# Patient Record
Sex: Female | Born: 1965 | Race: Black or African American | Hispanic: No | Marital: Single | Smoking: Current every day smoker
Health system: Southern US, Community
[De-identification: ages and names within clinical notes are randomized; demographics above are authoritative.]

## PROBLEM LIST (undated history)

## (undated) DIAGNOSIS — N838 Other noninflammatory disorders of ovary, fallopian tube and broad ligament: Secondary | ICD-10-CM

## (undated) DIAGNOSIS — N939 Abnormal uterine and vaginal bleeding, unspecified: Secondary | ICD-10-CM

## (undated) DIAGNOSIS — D631 Anemia in chronic kidney disease: Secondary | ICD-10-CM

## (undated) DIAGNOSIS — L0390 Cellulitis, unspecified: Secondary | ICD-10-CM

## (undated) DIAGNOSIS — N183 Chronic kidney disease, stage 3 unspecified: Secondary | ICD-10-CM

## (undated) DIAGNOSIS — F17200 Nicotine dependence, unspecified, uncomplicated: Secondary | ICD-10-CM

## (undated) DIAGNOSIS — D472 Monoclonal gammopathy: Secondary | ICD-10-CM

## (undated) DIAGNOSIS — Z91199 Patient's noncompliance with other medical treatment and regimen due to unspecified reason: Secondary | ICD-10-CM

## (undated) DIAGNOSIS — F419 Anxiety disorder, unspecified: Secondary | ICD-10-CM

## (undated) DIAGNOSIS — I10 Essential (primary) hypertension: Secondary | ICD-10-CM

## (undated) DIAGNOSIS — G629 Polyneuropathy, unspecified: Secondary | ICD-10-CM

## (undated) DIAGNOSIS — N2581 Secondary hyperparathyroidism of renal origin: Secondary | ICD-10-CM

## (undated) DIAGNOSIS — J42 Unspecified chronic bronchitis: Secondary | ICD-10-CM

## (undated) DIAGNOSIS — M4800 Spinal stenosis, site unspecified: Secondary | ICD-10-CM

## (undated) DIAGNOSIS — E1101 Type 2 diabetes mellitus with hyperosmolarity with coma: Secondary | ICD-10-CM

## (undated) DIAGNOSIS — E785 Hyperlipidemia, unspecified: Secondary | ICD-10-CM

## (undated) DIAGNOSIS — M109 Gout, unspecified: Secondary | ICD-10-CM

## (undated) HISTORY — DX: Monoclonal gammopathy: D47.2

## (undated) HISTORY — DX: Secondary hyperparathyroidism of renal origin: N25.81

## (undated) HISTORY — DX: Nicotine dependence, unspecified, uncomplicated: F17.200

## (undated) HISTORY — DX: Morbid (severe) obesity due to excess calories: E66.01

## (undated) HISTORY — DX: Patient's noncompliance with other medical treatment and regimen due to unspecified reason: Z91.199

## (undated) HISTORY — PX: TONSILLECTOMY: SUR1361

## (undated) HISTORY — DX: Anxiety disorder, unspecified: F41.9

## (undated) HISTORY — DX: Gout, unspecified: M10.9

## (undated) HISTORY — DX: Other noninflammatory disorders of ovary, fallopian tube and broad ligament: N83.8

## (undated) HISTORY — DX: Spinal stenosis, site unspecified: M48.00

## (undated) HISTORY — DX: Type 2 diabetes mellitus with hyperosmolarity with coma: E11.01

## (undated) HISTORY — DX: Anemia in chronic kidney disease: D63.1

## (undated) HISTORY — DX: Abnormal uterine and vaginal bleeding, unspecified: N93.9

---

## 1997-11-03 ENCOUNTER — Other Ambulatory Visit: Admission: RE | Admit: 1997-11-03 | Discharge: 1997-11-03 | Payer: Self-pay | Admitting: Internal Medicine

## 2000-06-01 ENCOUNTER — Emergency Department (HOSPITAL_COMMUNITY): Admission: EM | Admit: 2000-06-01 | Discharge: 2000-06-01 | Payer: Self-pay | Admitting: Emergency Medicine

## 2000-06-01 ENCOUNTER — Encounter: Payer: Self-pay | Admitting: Emergency Medicine

## 2003-06-03 ENCOUNTER — Ambulatory Visit (HOSPITAL_COMMUNITY): Admission: RE | Admit: 2003-06-03 | Discharge: 2003-06-03 | Payer: Self-pay | Admitting: Family Medicine

## 2003-12-20 ENCOUNTER — Ambulatory Visit: Payer: Self-pay | Admitting: Nurse Practitioner

## 2004-01-14 ENCOUNTER — Ambulatory Visit: Payer: Self-pay | Admitting: Nurse Practitioner

## 2004-04-17 ENCOUNTER — Ambulatory Visit: Payer: Self-pay | Admitting: Nurse Practitioner

## 2004-06-16 ENCOUNTER — Ambulatory Visit: Payer: Self-pay | Admitting: *Deleted

## 2004-06-16 ENCOUNTER — Ambulatory Visit: Payer: Self-pay | Admitting: Nurse Practitioner

## 2004-12-01 ENCOUNTER — Encounter: Payer: Self-pay | Admitting: *Deleted

## 2004-12-01 IMAGING — CR DG CHEST 2V
3 series · 3 of 3 positions shown · non-contrast
Comparison: None.

CLINICAL DATA: Short of breath and cough with low O2 SATS.  
 CHEST - 2 VIEW:

[view not recorded (1 of 3)]
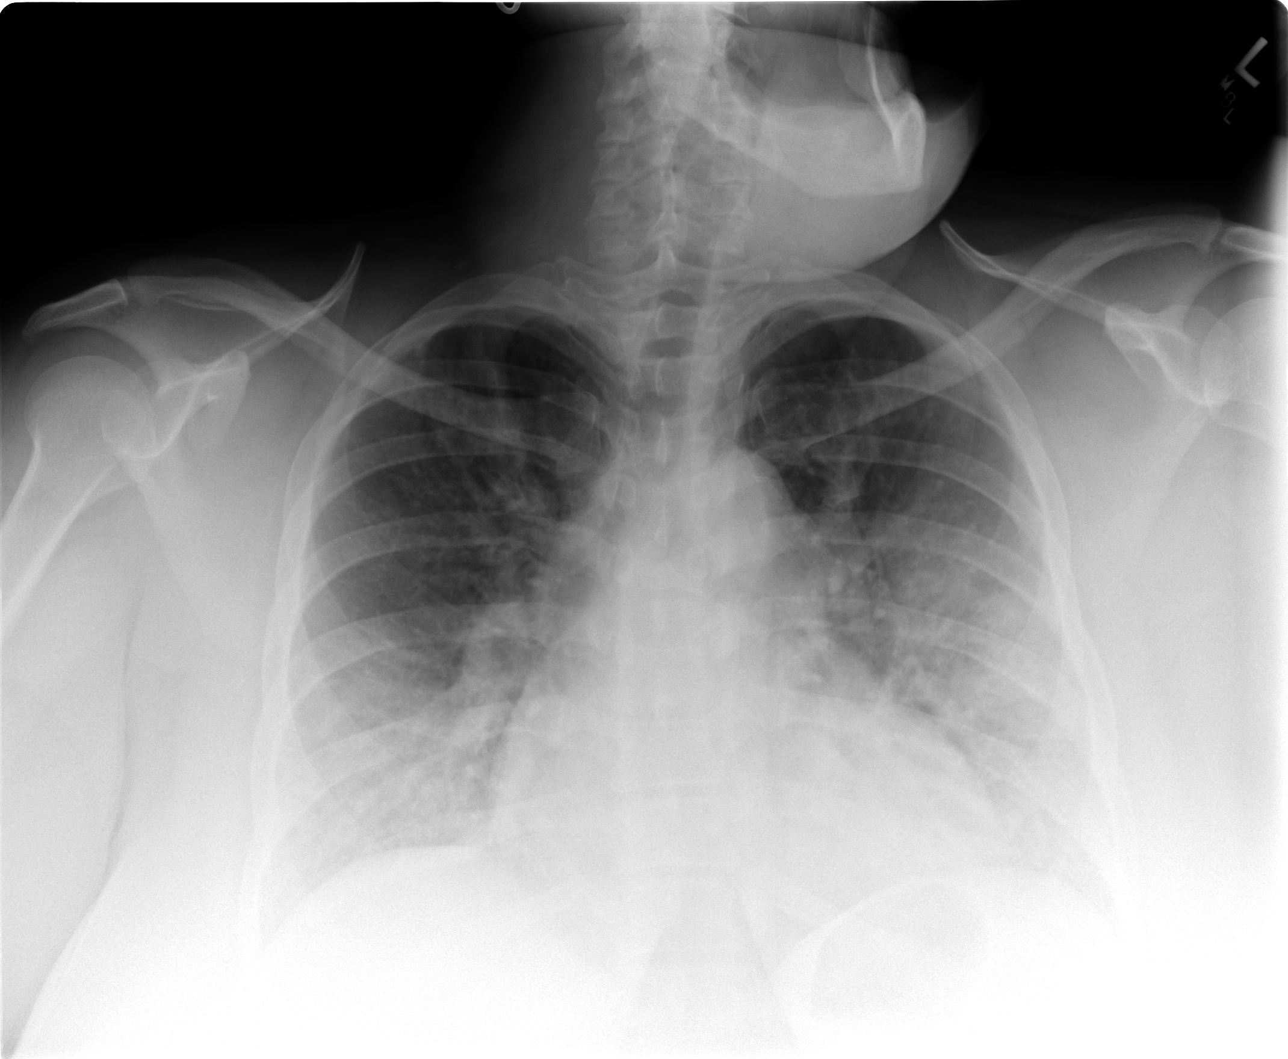

[view not recorded (2 of 3)]
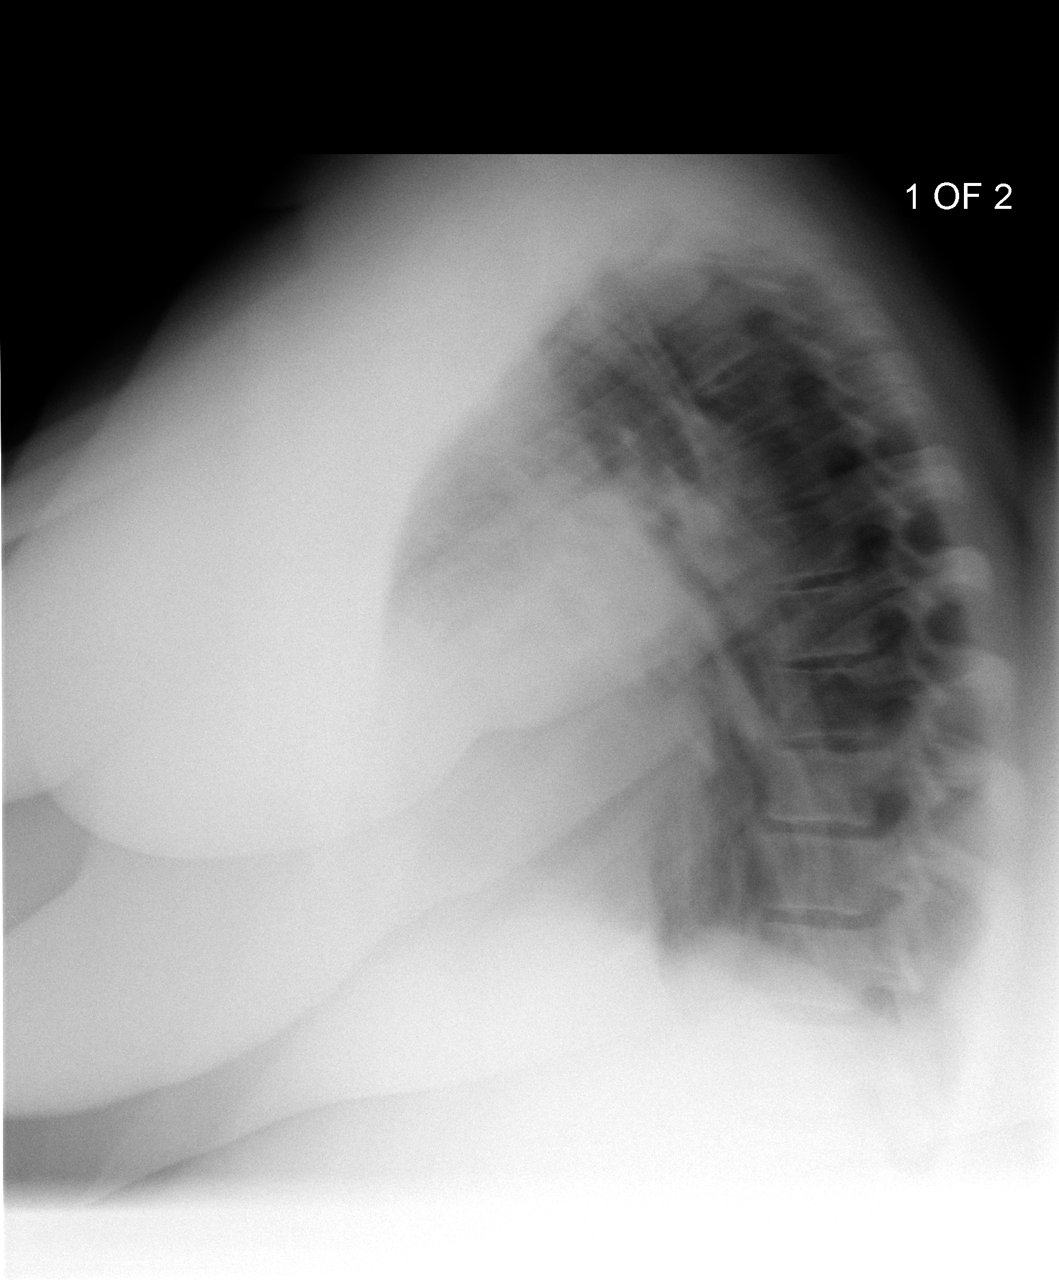

[view not recorded (3 of 3)]
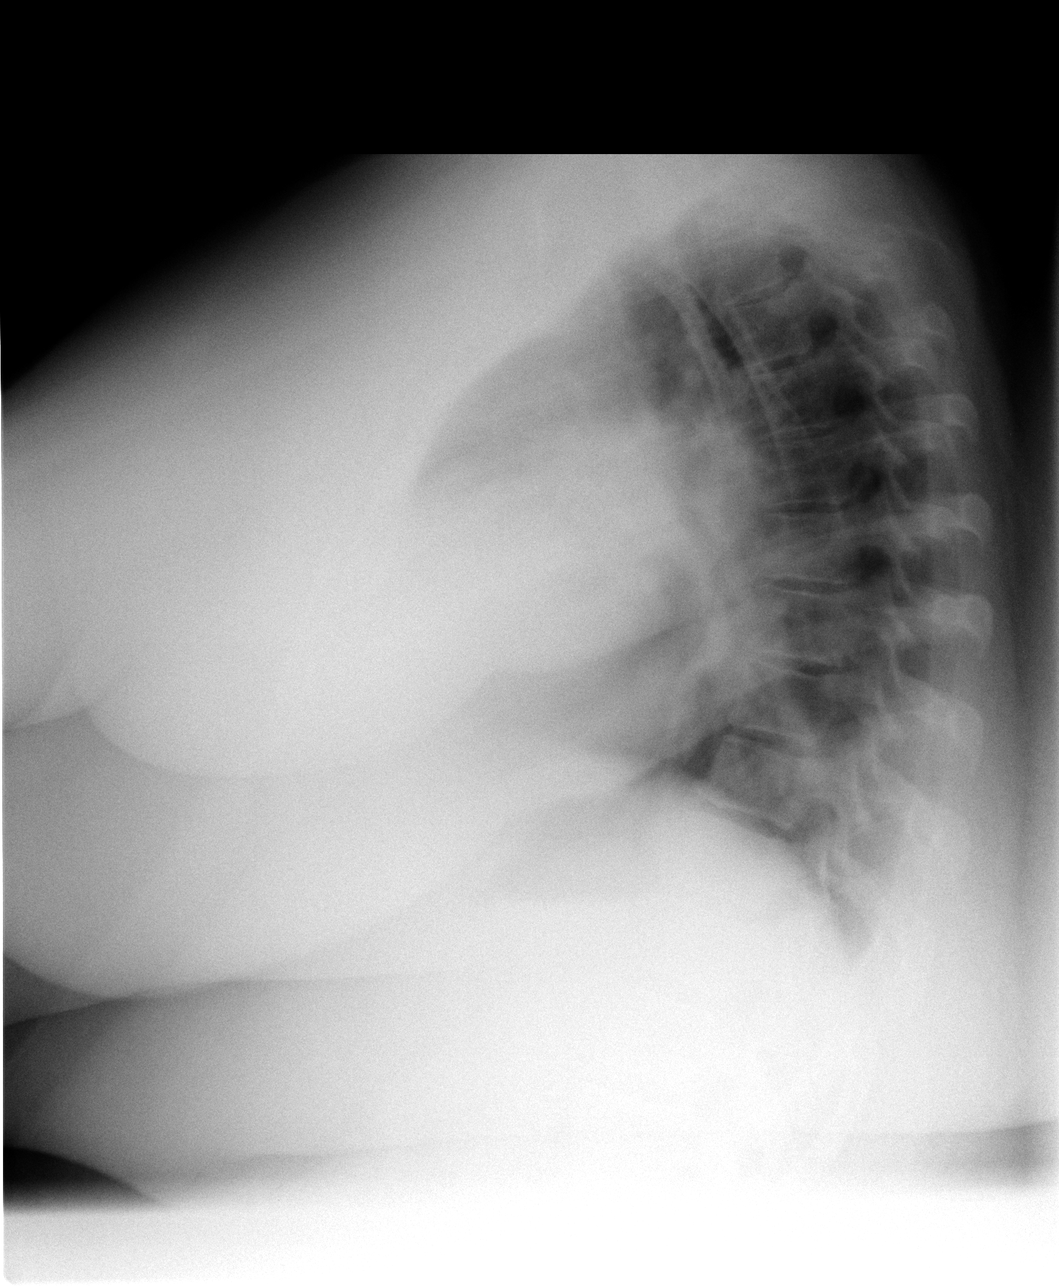

[3 of 3 positions shown; findings below may reference images not displayed]

FINDINGS: The PA film is underpenetrated and there is underpenetration and breathing motion artifact on the lateral view.  The patient?s body habitus is very large and this is a limited study.  The heart is enlarged.  There is no definite congestive heart failure.  There is a possible infiltrate in the left mid and lower lung on the PA film.  This could be an artifact but it is suspicious for pneumonia.
IMPRESSION: 1.  Technically suboptimal due to the patient?s size.
 2.  Cardiomegaly without definite failure.
 3.  Possible acute air space disease on the left.

## 2004-12-02 ENCOUNTER — Encounter (INDEPENDENT_AMBULATORY_CARE_PROVIDER_SITE_OTHER): Payer: Self-pay | Admitting: *Deleted

## 2004-12-02 ENCOUNTER — Inpatient Hospital Stay (HOSPITAL_COMMUNITY): Admission: EM | Admit: 2004-12-02 | Discharge: 2004-12-06 | Payer: Self-pay | Admitting: Emergency Medicine

## 2004-12-04 ENCOUNTER — Encounter (INDEPENDENT_AMBULATORY_CARE_PROVIDER_SITE_OTHER): Payer: Self-pay | Admitting: Cardiology

## 2004-12-29 ENCOUNTER — Ambulatory Visit: Payer: Self-pay | Admitting: Nurse Practitioner

## 2005-01-12 ENCOUNTER — Ambulatory Visit: Payer: Self-pay | Admitting: Nurse Practitioner

## 2005-06-29 ENCOUNTER — Ambulatory Visit: Payer: Self-pay | Admitting: Nurse Practitioner

## 2005-12-31 ENCOUNTER — Ambulatory Visit: Payer: Self-pay | Admitting: Nurse Practitioner

## 2006-01-10 ENCOUNTER — Ambulatory Visit: Payer: Self-pay | Admitting: Nurse Practitioner

## 2006-05-16 ENCOUNTER — Inpatient Hospital Stay (HOSPITAL_COMMUNITY): Admission: EM | Admit: 2006-05-16 | Discharge: 2006-05-21 | Payer: Self-pay | Admitting: Family Medicine

## 2006-05-16 ENCOUNTER — Ambulatory Visit: Payer: Self-pay | Admitting: Cardiology

## 2006-05-16 IMAGING — CR DG CHEST 1V PORT
1 series · 1 of 1 positions shown · non-contrast
Comparison: [DATE].

CLINICAL DATA: Fever, cough and respiratory distress; possible pneumonia, congestive heart failure.  Hypertension, diabetes, bronchitis.
 PORTABLE CHEST ? 1 VIEW ? [H3] HOURS:

[view not recorded]
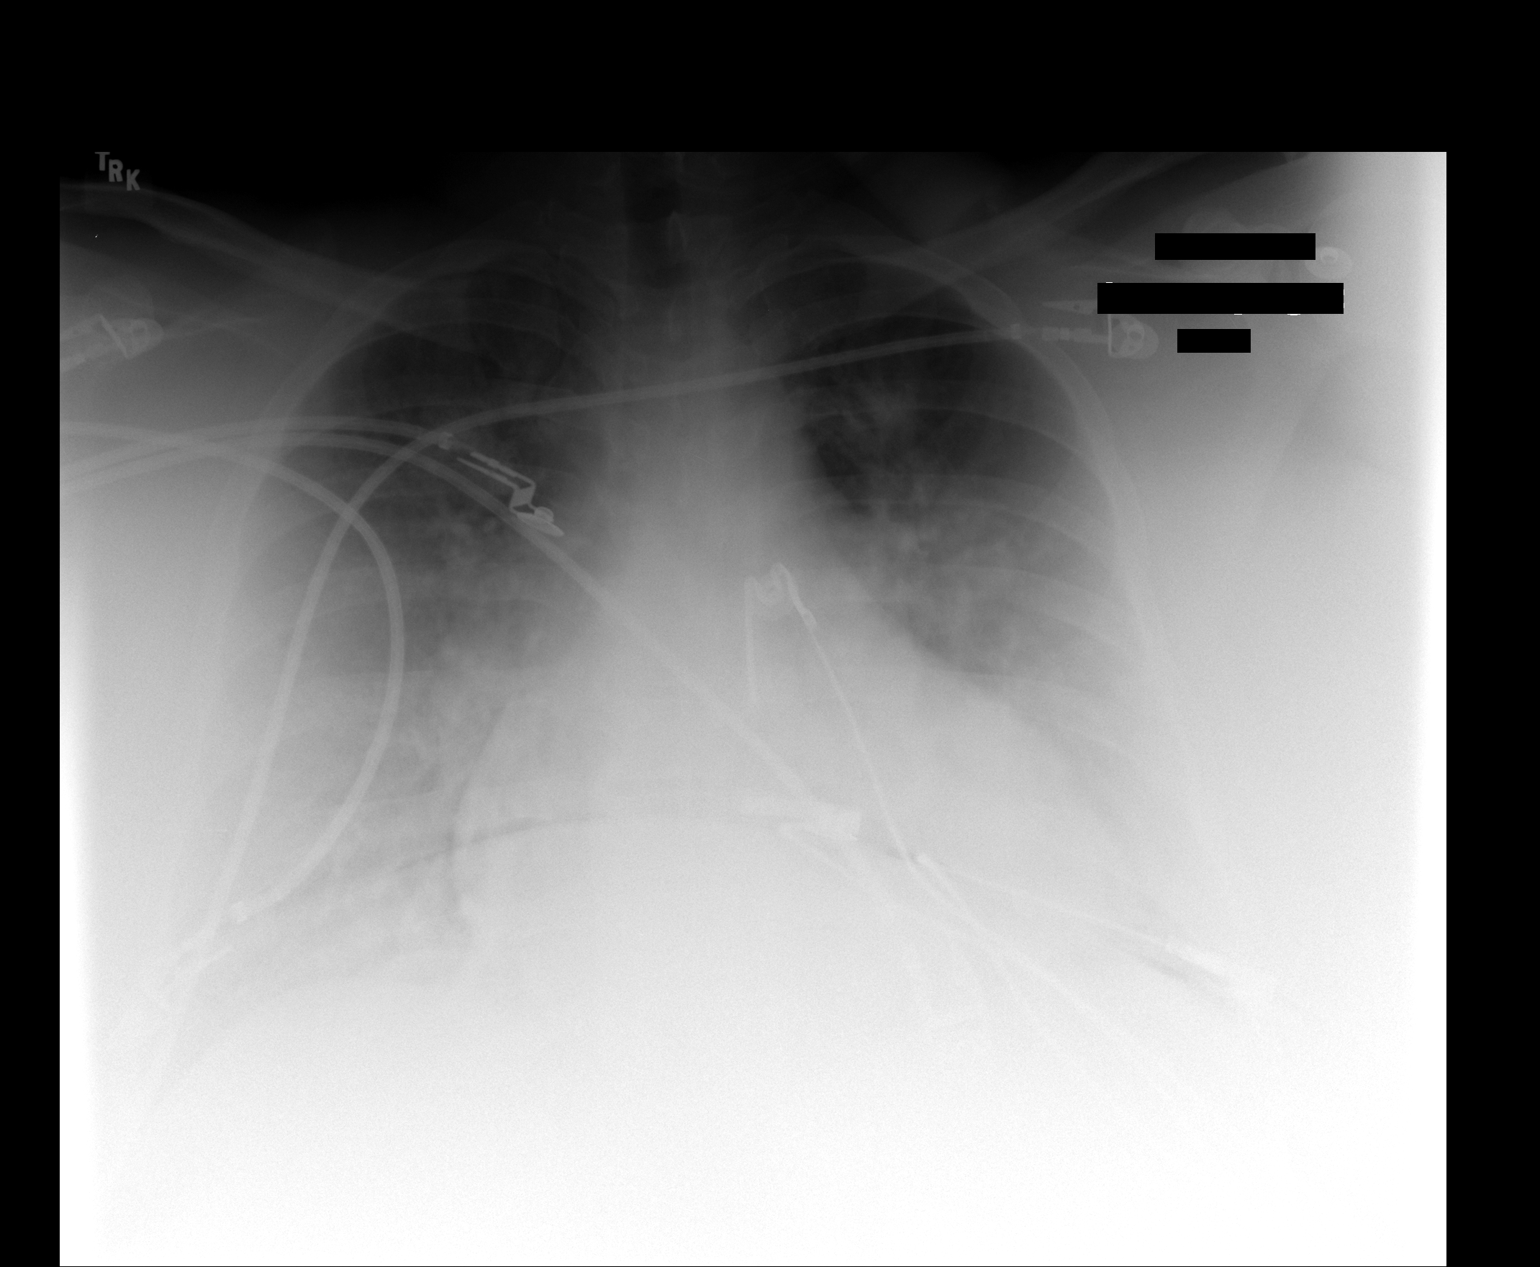

[1 of 1 positions shown; findings below may reference images not displayed]

FINDINGS: Extensive artifact overlies the chest.  There is motion degradation and film is under penetrated. Allowing for this, the heart appears enlarged.  There is pulmonary density diffusely which is most suggestive of edema.  Pneumonia is certainly not excluded given this limited film.  No measurable effusion is evident.  No significant bony finding.
IMPRESSION: Limited film.  Suspect pulmonary edema.  Pneumonia certainly is not excluded.

## 2006-05-18 ENCOUNTER — Encounter: Payer: Self-pay | Admitting: Cardiology

## 2006-08-09 ENCOUNTER — Ambulatory Visit: Payer: Self-pay | Admitting: Nurse Practitioner

## 2006-08-28 ENCOUNTER — Ambulatory Visit: Payer: Self-pay | Admitting: Nurse Practitioner

## 2006-12-25 ENCOUNTER — Encounter (INDEPENDENT_AMBULATORY_CARE_PROVIDER_SITE_OTHER): Payer: Self-pay | Admitting: *Deleted

## 2007-02-10 ENCOUNTER — Ambulatory Visit: Payer: Self-pay | Admitting: Family Medicine

## 2007-02-11 ENCOUNTER — Encounter (INDEPENDENT_AMBULATORY_CARE_PROVIDER_SITE_OTHER): Payer: Self-pay | Admitting: Nurse Practitioner

## 2007-06-05 ENCOUNTER — Encounter (INDEPENDENT_AMBULATORY_CARE_PROVIDER_SITE_OTHER): Payer: Self-pay | Admitting: Nurse Practitioner

## 2007-06-05 ENCOUNTER — Ambulatory Visit: Payer: Self-pay | Admitting: Family Medicine

## 2007-06-05 LAB — CONVERTED CEMR LAB
ALT: 8 units/L (ref 0–35)
AST: 15 units/L (ref 0–37)
Albumin: 4.1 g/dL (ref 3.5–5.2)
Alkaline Phosphatase: 79 units/L (ref 39–117)
BUN: 13 mg/dL (ref 6–23)
CO2: 22 meq/L (ref 19–32)
Calcium: 9.2 mg/dL (ref 8.4–10.5)
Chloride: 106 meq/L (ref 96–112)
Cholesterol: 234 mg/dL — ABNORMAL HIGH (ref 0–200)
Creatinine, Ser: 1.46 mg/dL — ABNORMAL HIGH (ref 0.40–1.20)
Glucose, Bld: 57 mg/dL — ABNORMAL LOW (ref 70–99)
HDL: 52 mg/dL (ref >39)
LDL Cholesterol: 138 mg/dL — ABNORMAL HIGH (ref 0–99)
Sodium: 141 meq/L (ref 135–145)
Triglycerides: 221 mg/dL — ABNORMAL HIGH (ref <150)
VLDL: 44 mg/dL — ABNORMAL HIGH (ref 0–40)

## 2007-09-09 ENCOUNTER — Encounter (INDEPENDENT_AMBULATORY_CARE_PROVIDER_SITE_OTHER): Payer: Self-pay | Admitting: Nurse Practitioner

## 2007-09-09 ENCOUNTER — Ambulatory Visit: Payer: Self-pay | Admitting: Internal Medicine

## 2007-09-09 LAB — CONVERTED CEMR LAB
ALT: 11 units/L (ref 0–35)
AST: 14 units/L (ref 0–37)
BUN: 17 mg/dL (ref 6–23)
Calcium: 9.7 mg/dL (ref 8.4–10.5)
Chloride: 105 meq/L (ref 96–112)
Creatinine, Ser: 1.42 mg/dL — ABNORMAL HIGH (ref 0.40–1.20)
Glucose, Bld: 105 mg/dL — ABNORMAL HIGH (ref 70–99)
Total Protein: 8 g/dL (ref 6.0–8.3)

## 2008-02-27 ENCOUNTER — Ambulatory Visit: Payer: Self-pay | Admitting: Internal Medicine

## 2008-03-02 ENCOUNTER — Encounter (INDEPENDENT_AMBULATORY_CARE_PROVIDER_SITE_OTHER): Payer: Self-pay | Admitting: Internal Medicine

## 2008-03-02 ENCOUNTER — Ambulatory Visit: Payer: Self-pay | Admitting: Internal Medicine

## 2008-03-02 LAB — CONVERTED CEMR LAB
ALT: 11 units/L (ref 0–35)
AST: 19 units/L (ref 0–37)
Alkaline Phosphatase: 90 units/L (ref 39–117)
CO2: 21 meq/L (ref 19–32)
Glucose, Bld: 76 mg/dL (ref 70–99)
LDL Cholesterol: 127 mg/dL — ABNORMAL HIGH (ref 0–99)
Total Bilirubin: 0.5 mg/dL (ref 0.3–1.2)
Total Protein: 8.3 g/dL (ref 6.0–8.3)

## 2008-03-19 ENCOUNTER — Ambulatory Visit (HOSPITAL_COMMUNITY): Admission: RE | Admit: 2008-03-19 | Discharge: 2008-03-19 | Payer: Self-pay | Admitting: Internal Medicine

## 2008-04-28 ENCOUNTER — Ambulatory Visit: Payer: Self-pay | Admitting: Obstetrics & Gynecology

## 2008-04-28 ENCOUNTER — Encounter (INDEPENDENT_AMBULATORY_CARE_PROVIDER_SITE_OTHER): Payer: Self-pay | Admitting: Obstetrics & Gynecology

## 2008-05-05 ENCOUNTER — Encounter (INDEPENDENT_AMBULATORY_CARE_PROVIDER_SITE_OTHER): Payer: Self-pay | Admitting: Internal Medicine

## 2008-05-05 ENCOUNTER — Ambulatory Visit: Payer: Self-pay | Admitting: Internal Medicine

## 2008-05-05 LAB — CONVERTED CEMR LAB
HDL: 47 mg/dL (ref >39)
LDL Cholesterol: 133 mg/dL — ABNORMAL HIGH (ref 0–99)
Total CHOL/HDL Ratio: 4.8

## 2008-08-26 ENCOUNTER — Ambulatory Visit: Payer: Self-pay | Admitting: Family Medicine

## 2008-08-26 ENCOUNTER — Encounter (INDEPENDENT_AMBULATORY_CARE_PROVIDER_SITE_OTHER): Payer: Self-pay | Admitting: Internal Medicine

## 2008-08-26 LAB — CONVERTED CEMR LAB
AST: 12 units/L (ref 0–37)
BUN: 16 mg/dL (ref 6–23)
Calcium: 9.2 mg/dL (ref 8.4–10.5)
Chloride: 106 meq/L (ref 96–112)
Cholesterol: 243 mg/dL — ABNORMAL HIGH (ref 0–200)
Creatinine, Ser: 1.54 mg/dL — ABNORMAL HIGH (ref 0.40–1.20)
Glucose, Bld: 105 mg/dL — ABNORMAL HIGH (ref 70–99)
HDL: 46 mg/dL (ref >39)
Sodium: 141 meq/L (ref 135–145)
Total Bilirubin: 0.4 mg/dL (ref 0.3–1.2)
Total CHOL/HDL Ratio: 5.3
Total Protein: 7.4 g/dL (ref 6.0–8.3)
Triglycerides: 191 mg/dL — ABNORMAL HIGH (ref <150)
VLDL: 38 mg/dL (ref 0–40)

## 2008-09-20 ENCOUNTER — Encounter: Admission: RE | Admit: 2008-09-20 | Discharge: 2008-12-19 | Payer: Self-pay | Admitting: Internal Medicine

## 2008-09-28 ENCOUNTER — Encounter (INDEPENDENT_AMBULATORY_CARE_PROVIDER_SITE_OTHER): Payer: Self-pay | Admitting: Internal Medicine

## 2008-09-28 ENCOUNTER — Ambulatory Visit: Payer: Self-pay | Admitting: Internal Medicine

## 2008-11-15 ENCOUNTER — Encounter (INDEPENDENT_AMBULATORY_CARE_PROVIDER_SITE_OTHER): Payer: Self-pay | Admitting: Internal Medicine

## 2008-11-15 ENCOUNTER — Ambulatory Visit: Payer: Self-pay | Admitting: Family Medicine

## 2008-11-15 LAB — CONVERTED CEMR LAB
ALT: 10 units/L (ref 0–35)
AST: 17 units/L (ref 0–37)
Albumin: 4 g/dL (ref 3.5–5.2)
Alkaline Phosphatase: 69 units/L (ref 39–117)
BUN: 24 mg/dL — ABNORMAL HIGH (ref 6–23)
CO2: 20 meq/L (ref 19–32)
Glucose, Bld: 84 mg/dL (ref 70–99)
Total Bilirubin: 0.3 mg/dL (ref 0.3–1.2)
Triglycerides: 165 mg/dL — ABNORMAL HIGH (ref <150)
VLDL: 33 mg/dL (ref 0–40)

## 2008-12-20 ENCOUNTER — Encounter: Admission: RE | Admit: 2008-12-20 | Discharge: 2009-03-20 | Payer: Self-pay | Admitting: Internal Medicine

## 2008-12-28 ENCOUNTER — Ambulatory Visit: Payer: Self-pay | Admitting: Internal Medicine

## 2008-12-28 ENCOUNTER — Encounter (INDEPENDENT_AMBULATORY_CARE_PROVIDER_SITE_OTHER): Payer: Self-pay | Admitting: Internal Medicine

## 2008-12-28 LAB — CONVERTED CEMR LAB
Albumin: 4.1 g/dL (ref 3.5–5.2)
BUN: 23 mg/dL (ref 6–23)
CO2: 21 meq/L (ref 19–32)
Calcium, Total (PTH): 9.2 mg/dL (ref 8.4–10.5)
Chloride: 106 meq/L (ref 96–112)
Creatinine, Ser: 2.09 mg/dL — ABNORMAL HIGH (ref 0.40–1.20)
Eosinophils Relative: 5 % (ref 0–5)
Glucose, Bld: 112 mg/dL — ABNORMAL HIGH (ref 70–99)
Hgb A1c MFr Bld: 6.6 % — ABNORMAL HIGH (ref 4.6–6.1)
Lymphocytes Relative: 45 % (ref 12–46)
MCV: 81.8 fL (ref 78.0–100.0)
Monocytes Absolute: 0.7 10*3/uL (ref 0.1–1.0)
PTH: 283.4 pg/mL — ABNORMAL HIGH (ref 14.0–72.0)
Platelets: 203 10*3/uL (ref 150–400)
Potassium: 5.3 meq/L (ref 3.5–5.3)
Sodium: 139 meq/L (ref 135–145)

## 2008-12-30 ENCOUNTER — Encounter (INDEPENDENT_AMBULATORY_CARE_PROVIDER_SITE_OTHER): Payer: Self-pay | Admitting: Internal Medicine

## 2008-12-30 LAB — CONVERTED CEMR LAB
Collection Interval-CRCL: 24 hr
Creatinine 24 HR UR: 417 mg/24hr — ABNORMAL LOW (ref 700–1800)
Protein, Ur: 52 mg/24hr (ref 50–100)

## 2009-01-04 ENCOUNTER — Ambulatory Visit: Payer: Self-pay | Admitting: Internal Medicine

## 2009-02-01 ENCOUNTER — Ambulatory Visit: Payer: Self-pay | Admitting: Internal Medicine

## 2009-04-11 ENCOUNTER — Ambulatory Visit: Payer: Self-pay | Admitting: Internal Medicine

## 2009-05-04 ENCOUNTER — Ambulatory Visit: Payer: Self-pay | Admitting: Internal Medicine

## 2009-05-09 ENCOUNTER — Ambulatory Visit: Payer: Self-pay | Admitting: Pulmonary Disease

## 2009-05-09 DIAGNOSIS — I10 Essential (primary) hypertension: Secondary | ICD-10-CM

## 2009-05-09 DIAGNOSIS — E785 Hyperlipidemia, unspecified: Secondary | ICD-10-CM

## 2009-05-09 DIAGNOSIS — I152 Hypertension secondary to endocrine disorders: Secondary | ICD-10-CM | POA: Insufficient documentation

## 2009-05-09 DIAGNOSIS — E119 Type 2 diabetes mellitus without complications: Secondary | ICD-10-CM

## 2009-05-09 DIAGNOSIS — E1169 Type 2 diabetes mellitus with other specified complication: Secondary | ICD-10-CM | POA: Insufficient documentation

## 2009-05-09 DIAGNOSIS — E1159 Type 2 diabetes mellitus with other circulatory complications: Secondary | ICD-10-CM | POA: Insufficient documentation

## 2009-05-09 HISTORY — DX: Type 2 diabetes mellitus without complications: E11.9

## 2009-05-30 ENCOUNTER — Encounter: Payer: Self-pay | Admitting: Pulmonary Disease

## 2009-05-30 ENCOUNTER — Ambulatory Visit (HOSPITAL_BASED_OUTPATIENT_CLINIC_OR_DEPARTMENT_OTHER): Admission: RE | Admit: 2009-05-30 | Discharge: 2009-05-30 | Payer: Self-pay | Admitting: Pulmonary Disease

## 2009-06-12 ENCOUNTER — Ambulatory Visit: Payer: Self-pay | Admitting: Pulmonary Disease

## 2009-06-13 ENCOUNTER — Telehealth (INDEPENDENT_AMBULATORY_CARE_PROVIDER_SITE_OTHER): Payer: Self-pay | Admitting: *Deleted

## 2009-06-16 ENCOUNTER — Ambulatory Visit: Payer: Self-pay | Admitting: Pulmonary Disease

## 2009-06-16 DIAGNOSIS — R0989 Other specified symptoms and signs involving the circulatory and respiratory systems: Secondary | ICD-10-CM

## 2009-06-16 DIAGNOSIS — R0609 Other forms of dyspnea: Secondary | ICD-10-CM

## 2009-08-24 ENCOUNTER — Ambulatory Visit: Payer: Self-pay | Admitting: Family Medicine

## 2009-09-08 ENCOUNTER — Ambulatory Visit (HOSPITAL_COMMUNITY): Admission: RE | Admit: 2009-09-08 | Discharge: 2009-09-08 | Payer: Self-pay | Admitting: Internal Medicine

## 2009-10-17 ENCOUNTER — Ambulatory Visit: Payer: Self-pay | Admitting: Internal Medicine

## 2009-10-17 LAB — CONVERTED CEMR LAB
BUN: 19 mg/dL (ref 6–23)
Calcium: 9.7 mg/dL (ref 8.4–10.5)
Chloride: 107 meq/L (ref 96–112)
Cholesterol: 276 mg/dL — ABNORMAL HIGH (ref 0–200)
Glucose, Bld: 89 mg/dL (ref 70–99)
HDL: 46 mg/dL (ref >39)
Potassium: 5.5 meq/L — ABNORMAL HIGH (ref 3.5–5.3)

## 2009-10-31 ENCOUNTER — Ambulatory Visit: Payer: Self-pay | Admitting: Family Medicine

## 2009-11-07 ENCOUNTER — Ambulatory Visit: Payer: Self-pay | Admitting: Internal Medicine

## 2009-11-07 LAB — CONVERTED CEMR LAB: BUN: 15 mg/dL (ref 6–23)

## 2009-11-28 ENCOUNTER — Ambulatory Visit: Payer: Self-pay | Admitting: Internal Medicine

## 2010-02-13 ENCOUNTER — Encounter (INDEPENDENT_AMBULATORY_CARE_PROVIDER_SITE_OTHER): Payer: Self-pay | Admitting: *Deleted

## 2010-02-13 LAB — CONVERTED CEMR LAB
Chloride: 100 meq/L (ref 96–112)
HDL: 55 mg/dL (ref >39)
Hgb A1c MFr Bld: 7.2 % — ABNORMAL HIGH (ref <5.7)
LDL Cholesterol: 163 mg/dL — ABNORMAL HIGH (ref 0–99)
Sodium: 137 meq/L (ref 135–145)
Total CHOL/HDL Ratio: 4.7

## 2010-02-20 ENCOUNTER — Ambulatory Visit: Payer: Self-pay | Admitting: Internal Medicine

## 2010-05-09 NOTE — Assessment & Plan Note (Signed)
Summary: rov discuss sleep results/apc   Copy to:  [place] Primary Provider/Referring Provider:  Karoline Caldwell  CC:  Pt is here for a f/u appt to discuss sleep study results.  .  History of Present Illness: The pt comes in today for review of her recent sleep study.  She was found to have small numbers of obstructive events which did not meet the criteria for the obstructive sleep apnea syndrome.  She only had transient desaturation to less than 88%.  I have reviewed the study with her in detail, and answered all of her questions.  Current Medications (verified): 1)  Vitamin D 1000 Unit Tabs (Cholecalciferol) .... Take 1 Tablet By Mouth Once A Day 2)  Pravastatin Sodium 80 Mg Tabs (Pravastatin Sodium) .... Take 1 Tablet By Mouth Once A Day 3)  Glipizide 10 Mg Tabs (Glipizide) .... Take 1 Tablet By Mouth Once A Day 4)  Zetia 10 Mg Tabs (Ezetimibe) .... Take 1 Tablet By Mouth Once A Day 5)  Lisinopril 5 Mg Tabs (Lisinopril) .... Take 1/2 Tab By Mouth Once Daily 6)  Actos 45 Mg Tabs (Pioglitazone Hcl) .... Take 1 Tablet By Mouth Once A Day 7)  Famotidine 20 Mg Tabs (Famotidine) .... Take 1 Tablet By Mouth Once A Day 8)  Lantus 100 Unit/ml Soln (Insulin Glargine) .... 20 Units Once A Daily  Allergies (verified): No Known Drug Allergies  Vital Signs:  Patient profile:   45 year old female Height:      64.5 inches Weight:      495 pounds O2 Sat:      95 % on Room air Temp:     97.8 degrees F oral Pulse rate:   90 / minute BP sitting:   132 / 78  (left arm) Cuff size:   Wrist   Vitals Entered By: Matthew Folks LPN (March 10, 624THL [id number] AM)  O2 Flow:  Room air CC: Pt is here for a f/u appt to discuss sleep study results.   Comments Medications reviewed with patient Matthew Folks LPN  March 10, 624THL 624THL AM    Physical Exam  General:  morbidly obese female in nad   Impression & Recommendations:  Problem # 1:  SNORING (ICD-786.09)  the pt surprisingly does not have osa.   She continues to be at high risk though if she does not start losing weight.  I have discussed this with her at great length.  Medications Added to Medication List This Visit: 1)  Lantus 100 Unit/ml Soln (Insulin glargine) .... 20 units once a daily  Other Orders: Est. Patient Level II [id number])  Patient Instructions: 1)  work aggressively on weight loss. 2)  followup as needed.   Immunization History:  Influenza Immunization History:    Influenza:  historical (04/09/2009)  Pneumovax Immunization History:    Pneumovax:  historical (04/09/2009)

## 2010-05-09 NOTE — Progress Notes (Signed)
Summary: need to sched ov with kc   ---- Converted from flag ---- ---- 06/13/2009 3:36 PM, Matthew Folks LPN wrote: pt needs ov with kc to discuss sleep study results. ------------------------------  ATC pt at home #.  Line busy.  WIll try back later. Matthew Folks LPN  March  7, 624THL [id number] PM  [facility].  Jinny Blossom Reynolds LPN  March  8, 624THL 579FGE PM  Phone Note Call from Patient   Caller: Patient Call For: clance Summary of Call: pt coming in 06/16/2009 @ 9:45am Initial call taken by: Zigmund Gottron,  June 15, 2009 11:02 AM

## 2010-05-09 NOTE — Assessment & Plan Note (Signed)
Summary: consult for osa   Copy to:  [place] Primary Provider/Referring Provider:  Karoline Caldwell  CC:  Sleep Consult.  History of Present Illness: The pt is a 45y/o morbidly obese female who I have been asked to see for possible osa.  She has been noted to have loud snoring, as well as pauses in her breathing during sleep.  She typically goes to bed btw 9-10pm, and arises at 10am to start her day.  She does not think she is unrested upon arising, but will quickly get sleepy anytime she sits down.  She cannot read or watch tv during day or night without dozing off.  She takes naps during the day as well.  She never drives due to disability.  Her epworth score today is 12, and her weight is up at least 100 pounds over the past 2 years.    Preventive Screening-Counseling & Management  Alcohol-Tobacco     Smoking Status: current  Current Medications (verified): 1)  Vitamin D 1000 Unit Tabs (Cholecalciferol) .... Take 1 Tablet By Mouth Once A Day 2)  Pravastatin Sodium 80 Mg Tabs (Pravastatin Sodium) .... Take 1 Tablet By Mouth Once A Day 3)  Glipizide 10 Mg Tabs (Glipizide) .... Take 1 Tablet By Mouth Once A Day 4)  Zetia 10 Mg Tabs (Ezetimibe) .... Take 1 Tablet By Mouth Once A Day 5)  Lisinopril 5 Mg Tabs (Lisinopril) .... Take 1/2 Tab By Mouth Once Daily 6)  Actos 45 Mg Tabs (Pioglitazone Hcl) .... Take 1 Tablet By Mouth Once A Day 7)  Famotidine 20 Mg Tabs (Famotidine) .... Take 1 Tablet By Mouth Once A Day  Allergies (verified): No Known Drug Allergies  Past History:  Past Medical History: Kidney disease unknown type DM (ICD-250.00) HYPERTENSION (ICD-401.9) HYPERLIPIDEMIA (ICD-272.4)    Past Surgical History: tonsillectomy at age 42  Family History: Reviewed history and no changes required. heart disease: father mother: DM, HTN siblings: DM  Social History: Reviewed history and no changes required. Patient is a current smoker.  started at age 60.  1 ppd. pt is  single. pt lives with mother. pt is disabled.  prev worked as a Quarry manager. Smoking Status:  current  Review of Systems       The patient complains of shortness of breath with activity, acid heartburn, headaches, and nasal congestion/difficulty breathing through nose.  The patient denies shortness of breath at rest, productive cough, non-productive cough, coughing up blood, chest pain, irregular heartbeats, indigestion, loss of appetite, weight change, abdominal pain, difficulty swallowing, sore throat, tooth/dental problems, sneezing, itching, ear ache, anxiety, depression, hand/feet swelling, joint stiffness or pain, rash, change in color of mucus, and fever.    Vital Signs:  Patient profile:   45 year old female Height:      64.5 inches Weight:      485 pounds BMI:     82.26 O2 Sat:      99 % on Room air Temp:     98.1 degrees F oral Pulse rate:   85 / minute BP sitting:   122 / 76  (left arm) Cuff size:   regular  Vitals Entered By: Matthew Folks LPN (January 31, 624THL 3:21 PM)  O2 Flow:  Room air  Physical Exam  General:  massively obese female in nad Eyes:  PERRLA and EOMI.   Nose:  patent without discharge Mouth:  soft tissue redundancy with airway narrowing elongation of soft palate and uvula Neck:  no jvd, tmg, LN Lungs:  clear to auscultation Heart:  rrr, 2/6 sem no rubs or gallops Abdomen:  soft and nontender, bs+ Extremities:  2+ edema bilat, no cyanosis pulses intact distally Neurologic:  alert and oriented, moves all 4.   Impression & Recommendations:  Problem # 1:  OBSTRUCTIVE SLEEP APNEA (ICD-327.23) The pt's history is very suggestive of osa.  She is very obese, has abnormal upper airway anatomy, and significant daytime sleepiness anytime she sits down.  I have discussed sleep apnea with her at great length, including its impact on her QOL and CV health.  I think she needs to have a sleep study for diagnosis, followed by aggessive treatment if positive.   The pt  is agreeable.  Medications Added to Medication List This Visit: 1)  Vitamin D 1000 Unit Tabs (Cholecalciferol) .... Take 1 tablet by mouth once a day 2)  Pravastatin Sodium 80 Mg Tabs (Pravastatin sodium) .... Take 1 tablet by mouth once a day 3)  Glipizide 10 Mg Tabs (Glipizide) .... Take 1 tablet by mouth once a day 4)  Zetia 10 Mg Tabs (Ezetimibe) .... Take 1 tablet by mouth once a day 5)  Lisinopril 5 Mg Tabs (Lisinopril) .... Take 1/2 tab by mouth once daily 6)  Actos 45 Mg Tabs (Pioglitazone hcl) .... Take 1 tablet by mouth once a day 7)  Famotidine 20 Mg Tabs (Famotidine) .... Take 1 tablet by mouth once a day  Other Orders: Consultation Level IV [id number]) Sleep Disorder Referral (Sleep Disorder)  Patient Instructions: 1)  will setup for sleep study.  Will call to schedule followup once results available. 2)  work on weight loss.

## 2010-07-31 ENCOUNTER — Inpatient Hospital Stay (HOSPITAL_COMMUNITY)
Admission: EM | Admit: 2010-07-31 | Discharge: 2010-08-05 | DRG: 872 | Disposition: A | Discharge status: Home / Self Care | Payer: Medicare Other | Attending: Internal Medicine | Admitting: Internal Medicine

## 2010-07-31 DIAGNOSIS — R1909 Other intra-abdominal and pelvic swelling, mass and lump: Secondary | ICD-10-CM | POA: Diagnosis present

## 2010-07-31 DIAGNOSIS — L98499 Non-pressure chronic ulcer of skin of other sites with unspecified severity: Secondary | ICD-10-CM | POA: Diagnosis present

## 2010-07-31 DIAGNOSIS — Z79899 Other long term (current) drug therapy: Secondary | ICD-10-CM

## 2010-07-31 DIAGNOSIS — N289 Disorder of kidney and ureter, unspecified: Secondary | ICD-10-CM | POA: Diagnosis present

## 2010-07-31 DIAGNOSIS — N183 Chronic kidney disease, stage 3 unspecified: Secondary | ICD-10-CM | POA: Diagnosis present

## 2010-07-31 DIAGNOSIS — Z6841 Body Mass Index (BMI) 40.0 and over, adult: Secondary | ICD-10-CM

## 2010-07-31 DIAGNOSIS — IMO0001 Reserved for inherently not codable concepts without codable children: Secondary | ICD-10-CM | POA: Diagnosis present

## 2010-07-31 DIAGNOSIS — E785 Hyperlipidemia, unspecified: Secondary | ICD-10-CM | POA: Diagnosis present

## 2010-07-31 DIAGNOSIS — I498 Other specified cardiac arrhythmias: Secondary | ICD-10-CM | POA: Diagnosis present

## 2010-07-31 DIAGNOSIS — J4489 Other specified chronic obstructive pulmonary disease: Secondary | ICD-10-CM | POA: Diagnosis present

## 2010-07-31 DIAGNOSIS — Z794 Long term (current) use of insulin: Secondary | ICD-10-CM

## 2010-07-31 DIAGNOSIS — R34 Anuria and oliguria: Secondary | ICD-10-CM | POA: Diagnosis present

## 2010-07-31 DIAGNOSIS — A419 Sepsis, unspecified organism: Secondary | ICD-10-CM | POA: Diagnosis present

## 2010-07-31 DIAGNOSIS — E860 Dehydration: Secondary | ICD-10-CM | POA: Diagnosis present

## 2010-07-31 DIAGNOSIS — F329 Major depressive disorder, single episode, unspecified: Secondary | ICD-10-CM | POA: Diagnosis present

## 2010-07-31 DIAGNOSIS — F17200 Nicotine dependence, unspecified, uncomplicated: Secondary | ICD-10-CM | POA: Diagnosis present

## 2010-07-31 DIAGNOSIS — I129 Hypertensive chronic kidney disease with stage 1 through stage 4 chronic kidney disease, or unspecified chronic kidney disease: Secondary | ICD-10-CM | POA: Diagnosis present

## 2010-07-31 DIAGNOSIS — J449 Chronic obstructive pulmonary disease, unspecified: Secondary | ICD-10-CM | POA: Diagnosis present

## 2010-07-31 DIAGNOSIS — R269 Unspecified abnormalities of gait and mobility: Secondary | ICD-10-CM | POA: Diagnosis present

## 2010-07-31 DIAGNOSIS — A088 Other specified intestinal infections: Secondary | ICD-10-CM | POA: Diagnosis present

## 2010-07-31 DIAGNOSIS — F3289 Other specified depressive episodes: Secondary | ICD-10-CM | POA: Diagnosis present

## 2010-07-31 DIAGNOSIS — Z8701 Personal history of pneumonia (recurrent): Secondary | ICD-10-CM

## 2010-07-31 LAB — BASIC METABOLIC PANEL
CO2: 20 mEq/L (ref 19–32)
Calcium: 9 mg/dL (ref 8.4–10.5)
Calcium: 8.4 mg/dL (ref 8.4–10.5)
Chloride: 97 mEq/L (ref 96–112)
Chloride: 98 mEq/L (ref 96–112)
Creatinine, Ser: 1.68 mg/dL — ABNORMAL HIGH (ref 0.4–1.2)
GFR calc Af Amer: 40 mL/min — ABNORMAL LOW (ref >60)
GFR calc Af Amer: 43 mL/min — ABNORMAL LOW (ref >60)
Glucose, Bld: 439 mg/dL — ABNORMAL HIGH (ref 70–99)
Potassium: 5.7 mEq/L — ABNORMAL HIGH (ref 3.5–5.1)
Sodium: 129 mEq/L — ABNORMAL LOW (ref 135–145)

## 2010-07-31 LAB — URINALYSIS, ROUTINE W REFLEX MICROSCOPIC
Leukocytes, UA: NEGATIVE
Protein, ur: NEGATIVE mg/dL
Specific Gravity, Urine: 1.024 (ref 1.005–1.030)
pH: 5.5 (ref 5.0–8.0)

## 2010-07-31 LAB — DIFFERENTIAL
Basophils Absolute: 0 10*3/uL (ref 0.0–0.1)
Basophils Relative: 0 % (ref 0–1)
Eosinophils Absolute: 0.2 10*3/uL (ref 0.0–0.7)
Eosinophils Relative: 1 % (ref 0–5)
Lymphocytes Relative: 13 % (ref 12–46)
Lymphs Abs: 2.2 10*3/uL (ref 0.7–4.0)
Monocytes Relative: 12 % (ref 3–12)
Neutro Abs: 12.6 10*3/uL — ABNORMAL HIGH (ref 1.7–7.7)
Neutrophils Relative %: 74 % (ref 43–77)

## 2010-07-31 LAB — CBC
Hemoglobin: 13 g/dL (ref 12.0–15.0)
MCHC: 31.9 g/dL (ref 30.0–36.0)
Platelets: 237 10*3/uL (ref 150–400)
RBC: 5.14 MIL/uL — ABNORMAL HIGH (ref 3.87–5.11)
WBC: 17.1 10*3/uL — ABNORMAL HIGH (ref 4.0–10.5)

## 2010-07-31 LAB — GLUCOSE, CAPILLARY: Glucose-Capillary: 357 mg/dL — ABNORMAL HIGH (ref 70–99)

## 2010-08-01 LAB — T4, FREE: Free T4: 1.1 ng/dL (ref 0.80–1.80)

## 2010-08-01 LAB — MAGNESIUM: Magnesium: 1.6 mg/dL (ref 1.5–2.5)

## 2010-08-01 LAB — COMPREHENSIVE METABOLIC PANEL
AST: 15 U/L (ref 0–37)
Alkaline Phosphatase: 70 U/L (ref 39–117)
BUN: 12 mg/dL (ref 6–23)
CO2: 23 mEq/L (ref 19–32)
Creatinine, Ser: 1.78 mg/dL — ABNORMAL HIGH (ref 0.4–1.2)
GFR calc Af Amer: 38 mL/min — ABNORMAL LOW (ref >60)
Potassium: 4.5 mEq/L (ref 3.5–5.1)
Sodium: 133 mEq/L — ABNORMAL LOW (ref 135–145)
Total Protein: 7.2 g/dL (ref 6.0–8.3)

## 2010-08-01 LAB — HEMOGLOBIN A1C
Hgb A1c MFr Bld: 13.5 % — ABNORMAL HIGH (ref <5.7)
Mean Plasma Glucose: 341 mg/dL — ABNORMAL HIGH (ref <117)

## 2010-08-01 LAB — GLUCOSE, CAPILLARY
Glucose-Capillary: 310 mg/dL — ABNORMAL HIGH (ref 70–99)
Glucose-Capillary: 290 mg/dL — ABNORMAL HIGH (ref 70–99)

## 2010-08-01 LAB — CBC
HCT: 35.3 % — ABNORMAL LOW (ref 36.0–46.0)
Hemoglobin: 11.3 g/dL — ABNORMAL LOW (ref 12.0–15.0)
MCH: 25.2 pg — ABNORMAL LOW (ref 26.0–34.0)
MCV: 78.8 fL (ref 78.0–100.0)

## 2010-08-01 NOTE — H&P (Signed)
NAME:  Makayla [name], Makayla [name] NO.:  [id number]  MEDICAL RECORD NO.:  [id number]           PATIENT TYPE:  E  LOCATION:  WLED                         FACILITY:  [facility]  PHYSICIAN:  Karlyn Agee, M.D. DATE OF BIRTH:  01/03/1966  DATE OF ADMISSION:  07/31/2010 DATE OF DISCHARGE:                             HISTORY & PHYSICAL   PRIMARY CARE PHYSICIAN:  [facility].  CHIEF COMPLAINT:  Uncontrolled blood sugars and skin sores for 2 weeks.  HISTORY OF PRESENT ILLNESS:  This is a 45 year old morbidly obese African American lady with a history of diabetes and morbid obesity who is managed at [facility].  She reports 2 weeks ago when checking her blood sugars, they usually run in the range of 100 to less than 120, but for the past 2 weeks she has been unable to control.  She has been getting very high readings despite her best efforts.  Of note, she has made no adjustments to her antidiabetic medications and she has not seen her primary physician for the past 6 months.  Coinciding with this, she also noted she has been having diarrhea at least 3 watery loose stools per day for the past 2 weeks, and 2 nights ago she noted chills -  which she thought was due to perimenopausal symptoms.  She has had associated episodic nausea for the past 2 weeks, but no frank vomiting. She has had no chest pains, no shortness of breath. She has had no lower extremity edema.  She has had increasing urination and increasing thirst, but no dysuria.  The patient gives her height as 5 feet 4.5 inches, but does not know her weight, says her last measured weight was 420 pounds 6 months ago.  She does not think she is losing weight.  PAST MEDICAL HISTORY: 1. Diabetes type 2. 2. Hypertension. 3. COPD, bronchitis. 4. Morbid obesity. 5. Past history of pneumonia.  MEDICATIONS: 1. Crestor 40 mg daily. 2. Lisinopril 2.5 mg daily. 3. Glipizide 10 mg twice daily. 4. Lantus 30 units at bedtime. 5.  Pravachol 20 mg daily. 6. Cozaar 50 mg daily. 7. Crestor 40 mg daily. 8. Iron tablets daily. 9. Calcitriol 0.25 mg as directed.  ALLERGIES:  No known drug allergies.  SOCIAL HISTORY:  Smokes 1 pack per day for the past 12 years.  Used to drink a fifth of Bacardi per month, but says she is cut down on her drinking considerably.  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  FAMILY HISTORY:  Significant for diabetes, hypertension, and coronary artery disease.  REVIEW OF SYSTEMS:  Other than noted above significant only for impaired mobility because of her morbid obesity.  She ambulates with a walker. Other than this review of systems unremarkable.  PHYSICAL EXAMINATION:  GENERAL:  Morbidly obese, young, African American lady lying in the stretcher. VITAL SIGNS:  Her rectal temperature is 103.  Her pulse is 117, respiration 20, blood pressure 138/77.  She is saturating at 96% on 2 L. HEENT:  Pupils are round and equal.  Mucous membranes pink, dry, anicteric. NECK:  No cervical lymphadenopathy.  No thyromegaly. CHEST:  Clear to auscultation bilaterally. CARDIOVASCULAR SYSTEM:  Tachycardiac.  No murmur heard. ABDOMEN:  Massively obese with massively large pannus. EXTREMITIES:  Also massively obese with large folds in her legs and multiple creases. SKIN:  She has shallow overall ulcerations in the folds of right thigh under the folds of the left pannus as well of the right pannus. Also shallow up to 5 cm in diameter with yellow necrotic base. multiple areas  of edema, darkening and thickening of skin. CENTRAL NERVOUS SYSTEM:  Cranial nerves II through XII grossly intact. She has no focal lateralizing signs.  LABORATORY DATA:  Her white count is elevated at 17.1, hemoglobin 13.0, platelets 237.  Absolute neutrophil count is 12.6.  After hydration in the emergency room, her sodium is 127, potassium 4.5, chloride 98, CO2 of 19, glucose 388, BUN 12, creatinine  1.60, calcium 8.4.  Urinalysis shows clear urine, negative for nitrites or leukocyte esterase, and microscopy is unremarkable.  ASSESSMENT: 1. Systemic inflammatory response syndrome, evidenced by fever,     leukocytosis, tachycardia. 2. Multiple skin sores, possible source of infection though, inflammation not     obvious on clinical exam, however, exam is difficult because of her     marked obesity. 3. Chronic diarrhea. 4. Tobacco abuse. 5. Hypertension. 6. Morbid obesity. 7. Diabetes type 2, uncontrolled. 8. Acute renal failure, caused by dehydration. 9. Gait abnormality due to morbid obesity.  PLAN: 1. We will admit this lady for hydration and normalization of her     blood sugar.  She will also need education on diabetes and     nutritional management. 2. We will do blood cultures and start vancomycin for treatment of     skin infection.  We will get evaluation of her stool including a C     difficile PCR and empirically start Flagyl. 3. Other plans as per orders.     Karlyn Agee, M.D.     LC/MEDQ  D:  07/31/2010  T:  07/31/2010  Job:  [id number]  cc:   Dorna Mai, MD Fax: [phone]  Electronically Signed by Karlyn Agee M.D. on 08/01/2010 06:36:55 PM

## 2010-08-02 ENCOUNTER — Inpatient Hospital Stay (HOSPITAL_COMMUNITY): Payer: Medicare Other

## 2010-08-02 LAB — CBC
HCT: 35.7 % — ABNORMAL LOW (ref 36.0–46.0)
Hemoglobin: 11.4 g/dL — ABNORMAL LOW (ref 12.0–15.0)
MCV: 79 fL (ref 78.0–100.0)
RBC: 4.52 MIL/uL (ref 3.87–5.11)
WBC: 19.6 10*3/uL — ABNORMAL HIGH (ref 4.0–10.5)

## 2010-08-02 LAB — BASIC METABOLIC PANEL
BUN: 14 mg/dL (ref 6–23)
CO2: 19 mEq/L (ref 19–32)
Chloride: 104 mEq/L (ref 96–112)
GFR calc non Af Amer: 31 mL/min — ABNORMAL LOW (ref >60)
Glucose, Bld: 250 mg/dL — ABNORMAL HIGH (ref 70–99)
Potassium: 4.3 mEq/L (ref 3.5–5.1)
Sodium: 130 mEq/L — ABNORMAL LOW (ref 135–145)

## 2010-08-02 LAB — GLUCOSE, CAPILLARY
Glucose-Capillary: 280 mg/dL — ABNORMAL HIGH (ref 70–99)
Glucose-Capillary: 152 mg/dL — ABNORMAL HIGH (ref 70–99)

## 2010-08-02 IMAGING — US US RENAL
1 series · 14 of 25 positions shown · non-contrast
Comparison: None

CLINICAL DATA: Anuria

RENAL/URINARY TRACT ULTRASOUND COMPLETE

[Series 1: us renal · 0.30mm/px · 14 of 31 slices shown]
[im 1/31]
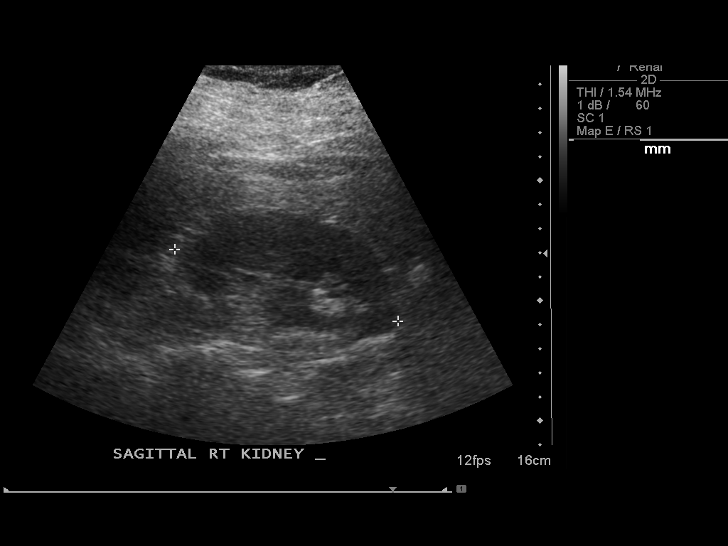
[im 3/31]
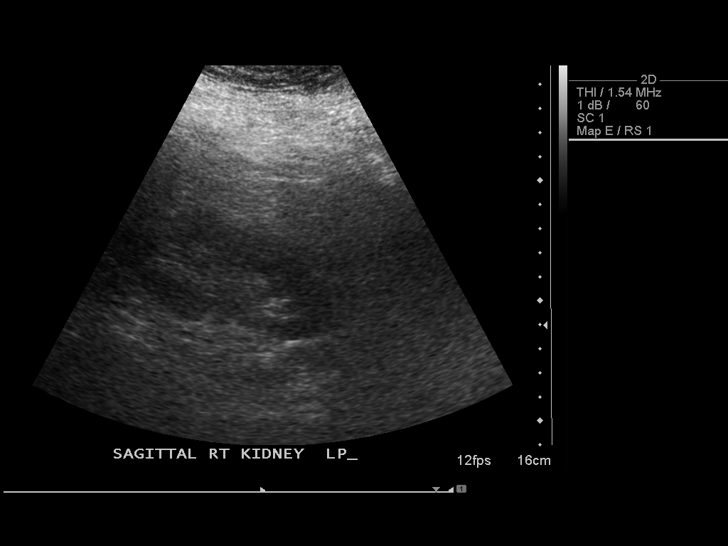
[im 6/31]
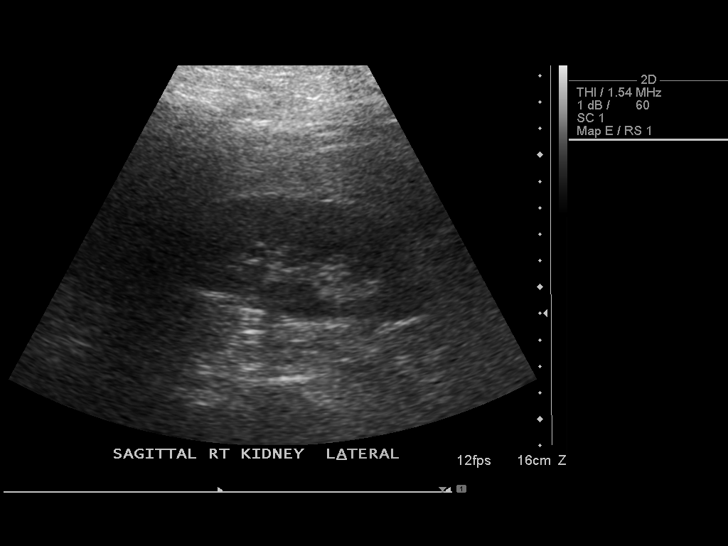
[im 8/31]
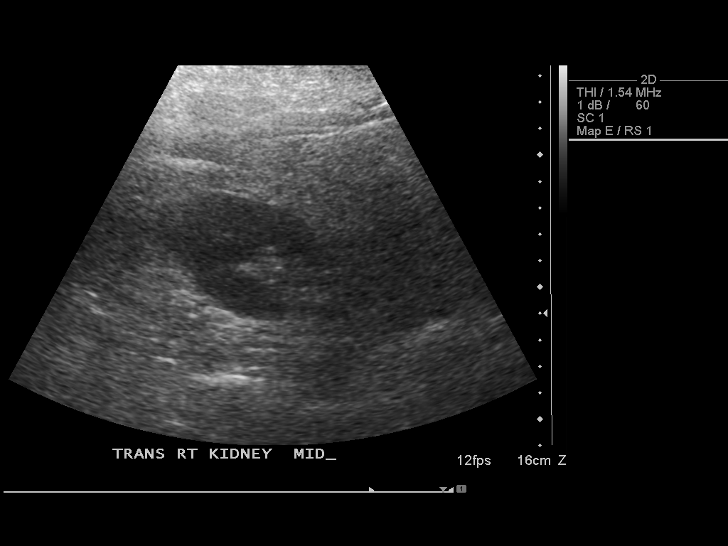
[im 11/31]
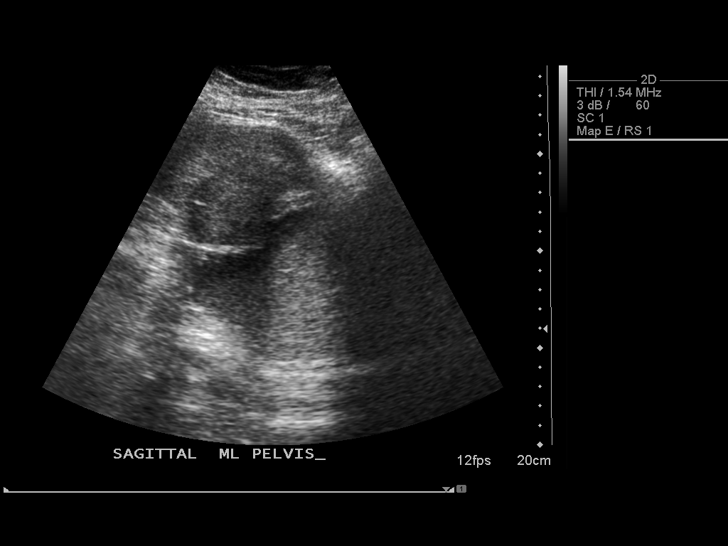
[im 12/31]
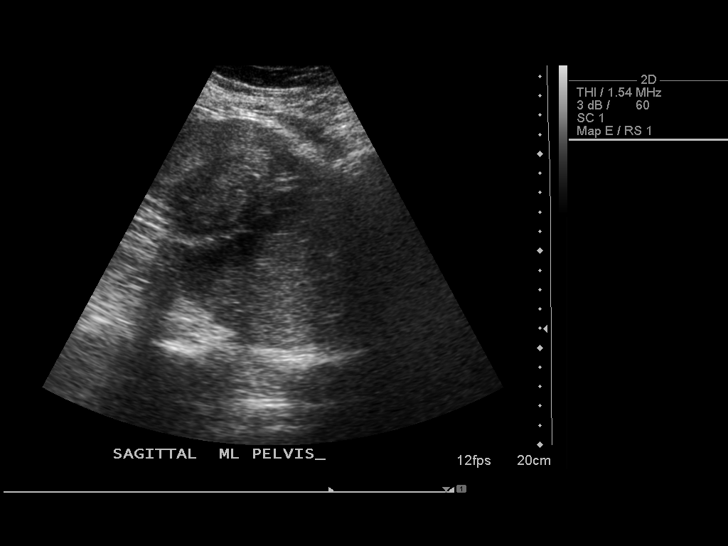
[im 14/31]
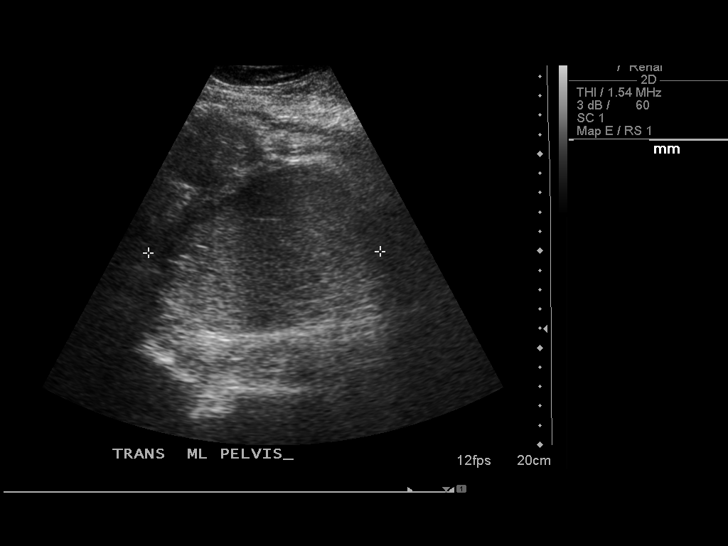
[im 17/31]
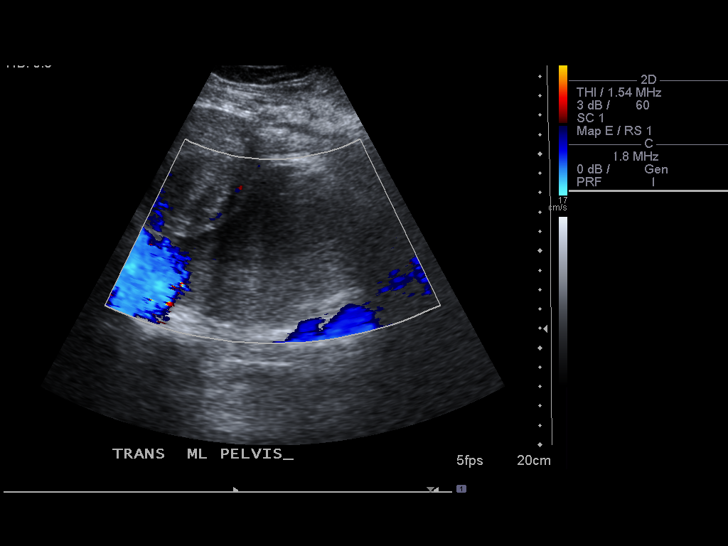
[im 19/31]
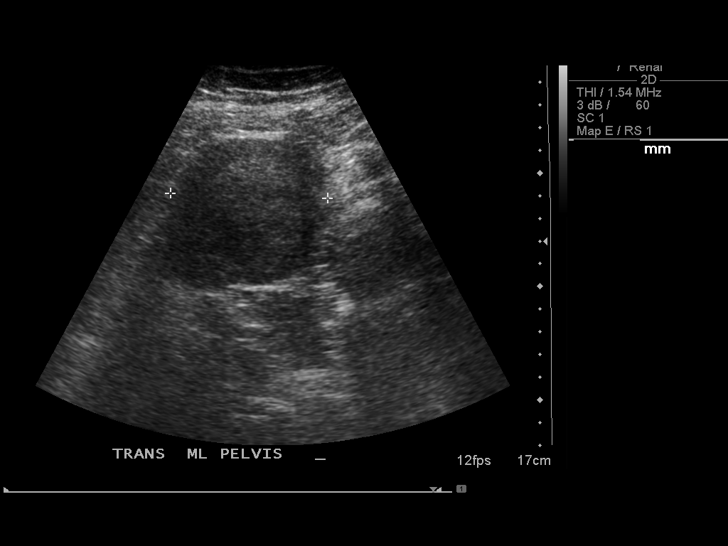
[im 21/31]
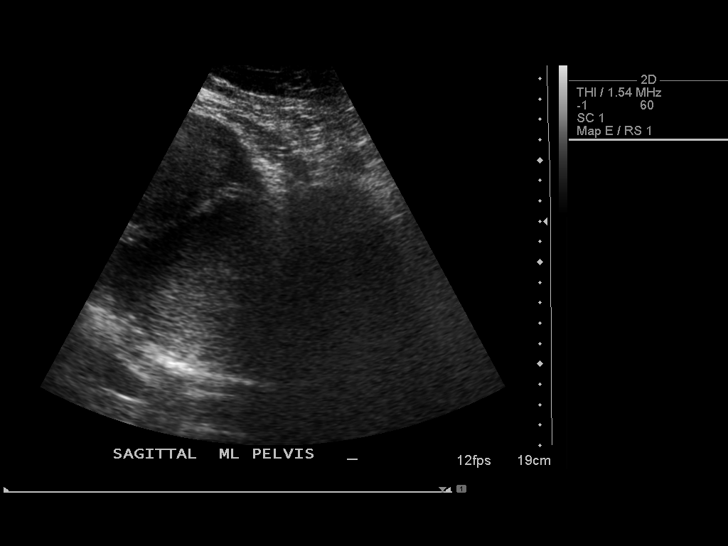
[im 23/31]
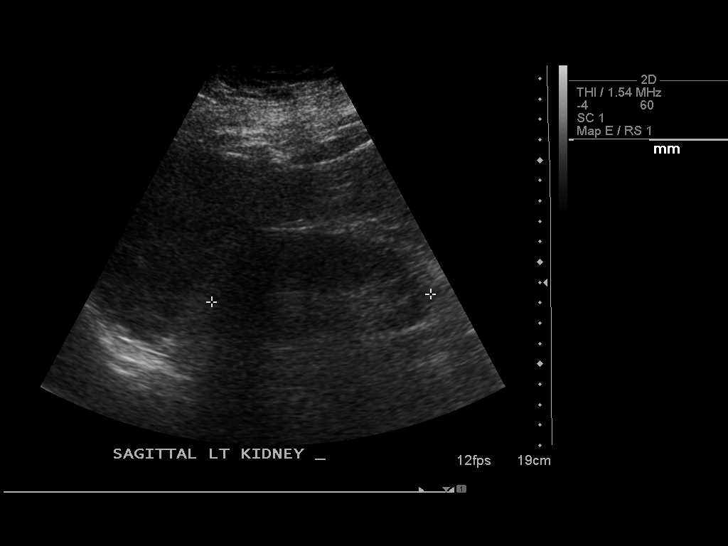
[im 26/31]
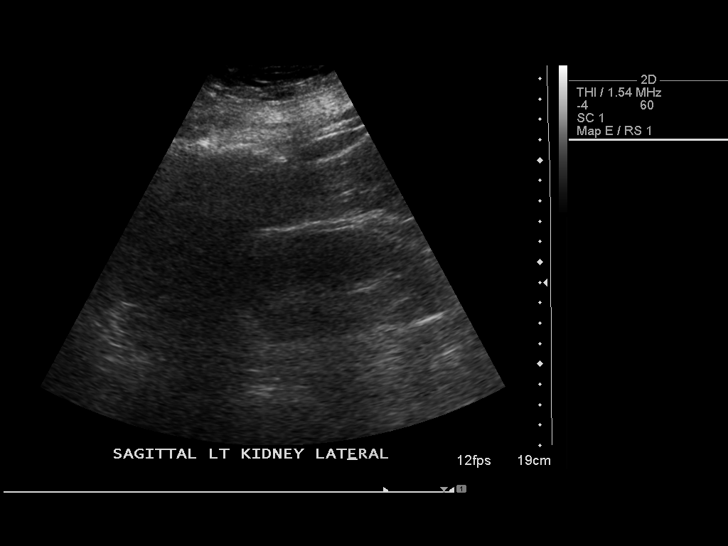
[im 28/31]
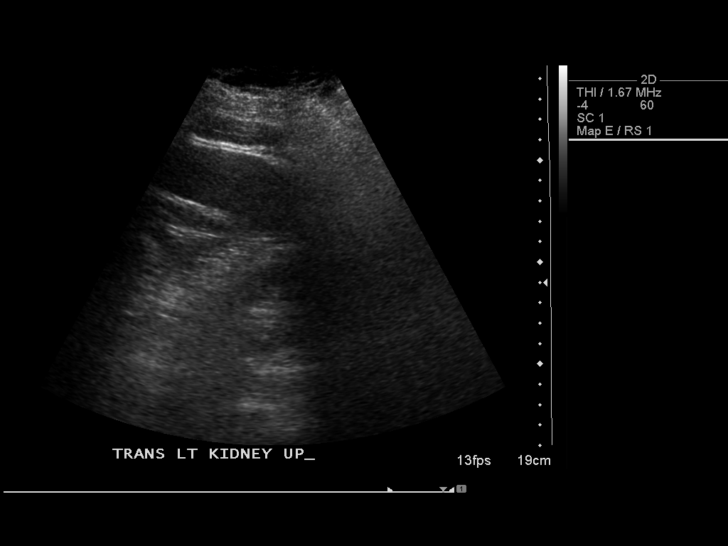
[im 31/31]
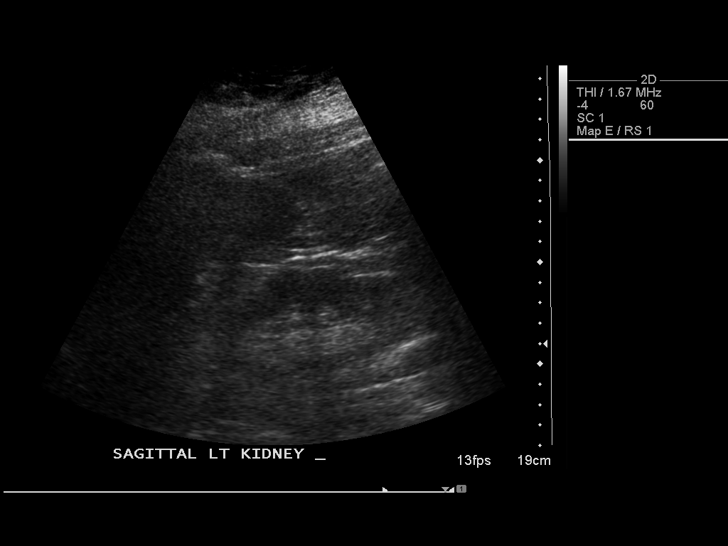

[14 of 25 positions shown; findings below may reference images not displayed]

FINDINGS: Right Kidney:  9.8 cm in length. Normal renal cortical thickness
and echogenicity without focal lesions or hydronephrosis.

Left Kidney:  10.8 cm in length. Normal renal cortical thickness
and echogenicity without focal lesions or hydronephrosis.

Bladder:  The bladder cannot be visualized.  A Foley catheter is in
place.  There are two solid appearing pelvic masses. The larger
lesion measures 10.0 x 8.5 x 12.0 cm and the smaller lesion
measures 7.8 x 7.0 x 7.0 cm.  The uterus and ovaries cannot be
visualized.  Further evaluation with CT or MRI is suggested.
IMPRESSION: 1.  Normal sonographic appearance of both kidneys.  No
hydronephrosis.
2.  Two large pelvic masses.  Further evaluation with CT or MRI is
recommended.

## 2010-08-03 ENCOUNTER — Inpatient Hospital Stay (HOSPITAL_COMMUNITY): Payer: Medicare Other

## 2010-08-03 LAB — CBC
HCT: 35.8 % — ABNORMAL LOW (ref 36.0–46.0)
MCH: 25.3 pg — ABNORMAL LOW (ref 26.0–34.0)
MCV: 79.4 fL (ref 78.0–100.0)
RBC: 4.51 MIL/uL (ref 3.87–5.11)
RDW: 14.4 % (ref 11.5–15.5)
WBC: 13.4 10*3/uL — ABNORMAL HIGH (ref 4.0–10.5)

## 2010-08-03 LAB — CLOSTRIDIUM DIFFICILE BY PCR: Toxigenic C. Difficile by PCR: NEGATIVE

## 2010-08-03 LAB — BASIC METABOLIC PANEL
BUN: 15 mg/dL (ref 6–23)
Chloride: 105 mEq/L (ref 96–112)
Creatinine, Ser: 1.89 mg/dL — ABNORMAL HIGH (ref 0.4–1.2)
Glucose, Bld: 128 mg/dL — ABNORMAL HIGH (ref 70–99)
Potassium: 4.7 mEq/L (ref 3.5–5.1)

## 2010-08-03 LAB — GLUCOSE, CAPILLARY
Glucose-Capillary: 152 mg/dL — ABNORMAL HIGH (ref 70–99)
Glucose-Capillary: 136 mg/dL — ABNORMAL HIGH (ref 70–99)

## 2010-08-04 ENCOUNTER — Ambulatory Visit (HOSPITAL_COMMUNITY): Payer: Medicare Other

## 2010-08-04 LAB — BASIC METABOLIC PANEL
BUN: 15 mg/dL (ref 6–23)
Calcium: 8.6 mg/dL (ref 8.4–10.5)
Creatinine, Ser: 1.75 mg/dL — ABNORMAL HIGH (ref 0.4–1.2)
GFR calc non Af Amer: 32 mL/min — ABNORMAL LOW (ref >60)
Glucose, Bld: 134 mg/dL — ABNORMAL HIGH (ref 70–99)
Potassium: 4.5 mEq/L (ref 3.5–5.1)

## 2010-08-04 LAB — GLUCOSE, CAPILLARY
Glucose-Capillary: 135 mg/dL — ABNORMAL HIGH (ref 70–99)
Glucose-Capillary: 106 mg/dL — ABNORMAL HIGH (ref 70–99)

## 2010-08-04 LAB — CBC
HCT: 36.6 % (ref 36.0–46.0)
MCH: 25.1 pg — ABNORMAL LOW (ref 26.0–34.0)
MCHC: 32 g/dL (ref 30.0–36.0)
MCV: 78.5 fL (ref 78.0–100.0)
RDW: 14.5 % (ref 11.5–15.5)

## 2010-08-05 LAB — GLUCOSE, CAPILLARY: Glucose-Capillary: 89 mg/dL (ref 70–99)

## 2010-08-05 LAB — BASIC METABOLIC PANEL
Chloride: 105 mEq/L (ref 96–112)
Creatinine, Ser: 1.6 mg/dL — ABNORMAL HIGH (ref 0.4–1.2)
GFR calc Af Amer: 43 mL/min — ABNORMAL LOW (ref >60)
Potassium: 4.1 mEq/L (ref 3.5–5.1)

## 2010-08-05 LAB — CBC
MCH: 25 pg — ABNORMAL LOW (ref 26.0–34.0)
Platelets: 240 10*3/uL (ref 150–400)
RBC: 4.48 MIL/uL (ref 3.87–5.11)
WBC: 13.2 10*3/uL — ABNORMAL HIGH (ref 4.0–10.5)

## 2010-08-05 NOTE — Discharge Summary (Signed)
NAME:  Makayla [name], Makayla [name] NO.:  [id number]  MEDICAL RECORD NO.:  [id number]           PATIENT TYPE:  I  LOCATION:  [id number]                         FACILITY:  [facility]  PHYSICIAN:  Sherryl Manges, M.D.  DATE OF BIRTH:  [birth year]  DATE OF ADMISSION:  07/31/2010 DATE OF DISCHARGE:  08/04/2010                              DISCHARGE SUMMARY   PRIMARY CARE PHYSICIAN:  Dorna Mai, M.D., [place].  DISCHARGE DIAGNOSES: 1. Acute enteritis, likely viral/self-limited. 2. Dehydration/acute renal insufficiency on chronic kidney disease. 3. Uncontrolled type 2 diabetes mellitus. 4. Morbid obesity. 5. Dyslipidemia. 6. Hypertension. 7. History of depression. 8. Infected superficial skin ulcers. 9. Systemic inflammatory response syndrome. 10.Pelvic masses on ultrasound scan August 02, 2010.  DISCHARGE MEDICATIONS: 1. Bactrim DS one p.o. b.i.d. for 5 days. 2. Lantus insulin 50 units subcutaneously q.h.s. (was on 30 units     subcutaneously q.h.s.). 3. Calcitriol 0.25 mcg p.o. on Mondays, Wednesdays and Fridays. 4. Crestor 20 mg p.o. daily. 5. Flonase nasal spray one spray p.r.n. daily for congestion. 6. Glipizide 10 mg p.o. b.i.d. 7. Ranitidine 150 mg p.o. daily. 8. Slow-release iron 45 mg p.o. daily. 9. Vitamin B12 1000 mcg p.o. daily.  Note: Pravastatin, Lisinopril and Cozaar have been discontinued.  PROCEDURES:  Renal ultrasound scan August 02, 2010.  This showed normal sonographic appearance of both kidneys.  No hydronephrosis.  There were two large pelvic masses, the first measuring 10 x 8.5 x 12.0 cm and a smaller lesion 7.8 x 7.0 x 7.0 cm.  Uterus and ovaries could not be visualized.  Further evaluation with CT or MRI suggested.  CONSULTATIONS:  None.  ADMISSION HISTORY:  As in H and P notes of July 31, 2010, dictated by Dr. Karlyn Agee.  However, in brief this is a 45 year old female, with known history of morbid obesity, COPD, type 2 diabetes  mellitus, and hypertension, presenting with uncontrolled blood glucose levels of approximately 2 weeks' duration, diarrhea for the same period of time, as well as worsening skin sores.  On initial evaluation she was found to have a temperature of 103, pulse 117 per minute, white cell count was 12.6.  She was therefore, admitted for further evaluation, investigation and management.  CLINICAL COURSE: 1. Acute enteritis.  The patient has been symptomatic with watery     diarrhea for approximately 2 weeks prior to presentation and on     arrival, she was found to be mildly dehydrated.  She was managed     with intravenous fluid hydration, but during the course of her     hospitalization she had no more diarrheal stools.  Likely this was     self-limited viral enteritis.  Of note, Clostridium difficile toxin     was negative.  2. Dehydration/acute renal insufficiency on chronic kidney disease.     The patient's BUN at the time of presentation, was 12 with a     creatinine of 1.60.  However, despite intravenous fluid hydration,     her creatinine continued to climb and was 1.81 on April 03, 2011, on which day  she also had some difficulty passing urine.     However, Foley catheter was inserted, intravenous fluids were     increased, and renal ultrasound scan was done.  For details and     findings of the renal ultrasound scan, refer to the procedure list     above.  This did not demonstrate any hydronephrosis.  Urine output     improved.  We noted a diminution in her creatinine levels and we     were subsequently able to discontinue the Foley catheter without     any deleterious consequences. As of August 05, 2010, her BUN was     12, creatinine 1.60, which we suspect is the patient's baseline.     ACE inhibitor and ARB have been discontinued, in view of renal     dysfunction.  Intravenous fluids were discontinued on August 04, 2010.  3. Uncontrolled type 2 diabetes mellitus.  The  patient's CBG was 456     at the time of presentation.  She was managed with sliding scale     insulin coverage, carbohydrate-modified diet and modification of     her Lantus insulin dosage.  Clinical response was satisfactory and     by August 04, 2010, she was euglycemic, with CBGs ranging between 81     and 113.  4. Hypertension.  The patient remained normotensive throughout the     course of her hospitalization even though her ARB and ACE inhibitor     were discontinued secondary to renal dysfunction.  We will defer to     her primary MD, to continue to monitor the patient's blood pressure.     She may need to be reinstated an alternative antihypertensive     medication, if indicated.  5. Infected superficial skin ulcers.  The patient has skin ulcers     which were evident on initial examination and these included the     right flank, right groin and left lower abdominal skin folds.     These also had purulent discharge.  She was started initially on     intravenous vancomycin, because she had clinical features of SIRS.     However, we were able to subsequently transition her to oral     Bactrim and as of August 05, 2010, wound infection was clearly     improved.  She was seen by the Wound Care Team and Aquacel has been     recommended on a daily basis, as well as local care.  As of August 05, 2010, the patient was on day number six of antibiotic therapy.     She is anticipated to complete another 5 days of antibiotics.  Of     note, she had no further episodes of pyrexia during the course of     this hospitalization and white cell count was 13.2 on August 05, 2010.  6. Pelvic masses.  This was an incidental finding on ultrasound scan,     done to evaluate for hydronephrosis on August 02, 2010.  It has     proven very problematic to attempt to obtain appropriate imaging     studies, as the patient, with a weight of 470 pounds, could not fit     into the MRI or CT scans at  either [facility]     or [facility].  After much effort, we were finally able  to     get her scheduled for an MRI at [facility] in [place] for Aug 08, 2010.  We shall defer to primary MD to follow up     on results.  DISPOSITION:  The patient was asymptomatic on August 05, 2010, and very keen to be discharged. There were with no new issues and she was therefore discharged accordingly.  ACTIVITY:  As tolerated.  Recommended to increase activity slowly.  DIET:  Heart-healthy/carbohydrate-modified.  FOLLOWUP INSTRUCTIONS:  The patient is to follow up with Dr. Redmond Pulling, her primary MD at [facility], telephone number [phone].  An appointment has been scheduled for Aug 10, 2010, at 12 noon.  In addition, an arrangement has been made for abdominal/pelvic MRI with contrast at [place] for Aug 08, 2010, at 1:45 p.m., telephone number [phone].  All of this has been communicated to the patient and she has verbalized understanding.     Sherryl Manges, M.D.     CO/MEDQ  D:  08/05/2010  T:  08/05/2010  Job:  [id number]  cc:   Dorna Mai, MD Fax: [phone]  Electronically Signed by Sherryl Manges M.D. on 08/05/2010 04:34:51 PM

## 2010-08-07 LAB — CULTURE, BLOOD (ROUTINE X 2)
Culture  Setup Time: 201204240830
Culture: NO GROWTH

## 2010-08-22 ENCOUNTER — Ambulatory Visit: Payer: Medicare Other | Admitting: *Deleted

## 2010-08-22 NOTE — Group Therapy Note (Signed)
NAME:  Makayla [name], Makayla [name] NO.:  [id number]   MEDICAL RECORD NO.:  [id number]          PATIENT TYPE:  WOC   LOCATION:  [facility]                   FACILITY:  [facility]   PHYSICIAN:  Nicoletta Dress, MD     DATE OF BIRTH:  Apr 14, 1965   DATE OF SERVICE:                                  CLINIC NOTE   HISTORY:  A 45 year old black female, gravida 0, who is here for yearly  exam.  Her last menstrual period was 3 years ago.  She has had no  vaginal bleeding since that time.  She is followed at [facility] for  her diabetes and her blood pressure.  She is morbidly obese weighing  close to 475 pounds.  She has no GI or GU complaints.   FAMILY HISTORY:  Negative for breast cancer, colon cancer or ovarian  cancer.   PHYSICAL EXAMINATION:  VITAL SIGNS:  Height 5 feet 4-1/2 inches, weight  474.5 pounds, blood pressure 145/83.  BREASTS:  Bilaterally normal.  No  masses felt.  ABDOMEN:  Morbidly obese with a huge panniculus.  No masses are felt.  GU:  Pelvic exam is performed with some difficulty and required the  patient to hold up her panniculus and assistance to hold the inner  buttocks apart.  Normal speculum was used.  The patient has not had  intercourse in over a year.  It was extremely difficult to visualize the  cervix because she is nulliparous and it was located very high up in the  vagina and the pelvic sidewalls kept collapsing into the view.  A Pap  smear was done, but I could not do a Cytobrush because it was just  technically difficult.  Likewise, bimanual exam was pretty much not able  to be satisfactorily performed because of her massive size.  I did not  feel any abnormal pelvic masses.   IMPRESSION:  1. Morbid obesity.  2. Normal gynecologic examination.   DISPOSITION:  1. Pap smear is done, although the specimen may not be completely      adequate.  2. The patient is instructed in additional ways to help lose weight.      She apparently drinks an awful lot  of regular Pepsi and other      drinks.  I have encouraged her to switch completely to unsweetened      drinks, Diet Dr. Malachi Bonds, etc.  I have told if she will just make      this change, she should easily drop 4-5 to perhaps 6 pounds a      month.  She is also cutting back on carbohydrates and she has      joined a gym.  Only time will tell us how successful she will be.           ______________________________  Nicoletta Dress, MD     ER/MEDQ  D:  04/28/2008  T:  04/28/2008  Job:  [id number]   cc:   [facility]

## 2010-08-25 NOTE — H&P (Signed)
NAME:  Makayla [name], Makayla [name] NO.:  [id number]   MEDICAL RECORD NO.:  [id number]          PATIENT TYPE:  INP   LOCATION:  0158                         FACILITY:  [facility]   PHYSICIAN:  Cyril Mourning, D.O.    DATE OF BIRTH:  Oct 18, 1965   DATE OF ADMISSION:  12/02/2004  DATE OF DISCHARGE:                                HISTORY & PHYSICAL   PRIMARY CARE PHYSICIAN:  [facility].   CHIEF COMPLAINT:  Shortness of breath x1 week.   HISTORY OF PRESENT ILLNESS:  Makayla [name] is a pleasant 45 year old morbidly  obese African-American female who has been suffering with shortness of  breath over the past week. She was seen at Urgent Care and has been placed  on 2 liters of nasal cannula. She reports productive cough with yellow  sputum she had been toiling with for the past week or so. She is a one pack  per day smoker and she denies any problems with fever or chills at home or  chest pain. She just describes the shortness of breath and productive cough.   She has no previous medical history significant for high blood pressure or  heart disease or heart failure. In any event, in the emergency department,  she was by radiograph discovered to have left lower lobe air space disease,  possible pneumonia. She does have a productive cough and suspect she has  bronchitis exacerbation. She did have a viral illness the week previously  where she did have some diarrhea and some fevers but this has resolved at  this point and her predominant problem is hypoxia and respiratory distress  felt to be related to the above. Although her BNP was elevated in the  emergency department at 629, she does not appear to be hypervolemic and in  frank heart failure. Her creatinine is a bit elevated at 1.7. Her body  habitus limits her volume assessment.   PAST MEDICAL HISTORY:  As described above, diabetes mellitus on Lantus at  home, gastroesophageal reflux disease, morbid obesity, tobacco dependence.   HOME  MEDICATIONS:  She states that she takes:  1.  Glucotrol 20 mg twice daily.  2.  She also takes Lantus 20 units in the evening.  3.  She takes Zocor 20 mg a day.  4.  Zetia daily.  5.  I believe she also takes Prevacid.   ALLERGIES:  She denies any drug allergies.   SOCIAL HISTORY:  She is a one pack per day smoker, she lives with her mom,  she is quite sedentary. She has no children, does not drink. She is able to  ambulate at home.   FAMILY HISTORY:  Her mother alive and well at 56 though does have diabetes.  Father died at age 32 with cardiac arrest.   REVIEW OF SYMPTOMS:  The patient denies any chest pain. She has had dyspnea  at rest. She has had a productive cough with yellow sputum, has resolved  viral illness from last week, otherwise no further complaints. Further  review of systems unremarkable.   PHYSICAL EXAMINATION:  VITAL SIGNS:  The patient's blood pressure was  131/87, temperature 98.2, O2 saturation 100% on supplemental oxygen therapy.  GENERAL:  The patient is quite tachypneic and does appear to have some  respiratory difficulty. She is intermittently tachycardic; however, she is  able to answer questions with phrases. She is morbidly obese.  HEENT:  Head is normocephalic, atraumatic. Extraocular movements intact.  NECK:  Supple, nontender.  CARDIOVASCULAR:  Reveals normal S1, S2.  PULMONARY:  Quite limited due to her habitus; however, she does have some  wheezing.  ABDOMEN:  Morbidly obese, nontender.  EXTREMITIES:  Lower extremities reveal no edema. Peripheral pulses are  symmetrical and palpable bilaterally.  EXTREMITIES:  Cool.  NEUROLOGIC:  No focal or neurologic deficits. The patient is able to sit up  on her own. She moves all four extremities.   LABORATORY DATA:  The patient's sodium is 131, potassium 3.8, BUN 11,  creatinine 1.7, chloride 99. CO2 35, WBC 10, hemoglobin 14.8, platelet count  206, MCV 75.   BNP was 629, troponin was 0.10 at 2250.  Creatinine was 1.7.   Chest x-ray revealed probable air space disease on the left. EKG revealed  sinus tachycardia with some nonspecific findings. T waves slightly in the  lateral leads.   ASSESSMENT:  1.  Pneumonia left lower lobe.  2.  Bronchitis.  3.  Elevated BNP.  4.  Renal insufficiency, renal failure. Uncertain of baseline renal      function.  5.  Morbid obesity.  6.  Hyponatremia.  7.  Respiratory distress with tachypnea.  8.  Mildly elevated troponins.   PLAN:  1.  We are going check her cardiac markers. She is chest pain free at the      present. Will follow her course clinically, start her on aspirin therapy      and reassessment if she does develop actual chest discomfort or pain.  2.  Will initiate Avelox for treatment of her pneumonia in addition to      bronchitis and administer nebulizer treatments.  3.  Follow her renal function. Check her spot studies to assess for possibly      volume depleted status.  4.  It may be reasonable to check a 2-D echo, however, I suspect this may be      a limited study due to her body habitus.  5.  Will continue her Lantus therapy at home and check her hemoglobin A1c to      assess control and administer      liquids for now.  6.  She may need BIPAP intermittently.   We will follow her course closely.      Cyril Mourning, D.O.  Electronically Signed     ESS/MEDQ  D:  12/02/2004  T:  12/02/2004  Job:  [id number]

## 2010-08-25 NOTE — H&P (Signed)
NAME:  Makayla [name], SPRUNK NO.:  [id number]   MEDICAL RECORD NO.:  [id number]          PATIENT TYPE:  WOC   LOCATION:  WOC                          FACILITY:  [facility]   PHYSICIAN:  [name], MD       DATE OF BIRTH:  1965/07/05   DATE OF ADMISSION:  DATE OF DISCHARGE:                              HISTORY & PHYSICAL   PRESENTING COMPLAINT:  Cough and shortness of breath.   HISTORY OF PRESENT ILLNESS:  This is a 45 year old African American  female patient who came in because of cough and shortness of breath.  She has been sick for the past 3 days.  She took some medications at  home, but she has not been improving.  She had a fever.  Temperature was  102.5 when she came into the ED.  No nausea, no vomiting, no diarrhea.  Sputum is light.  She also had some wheezing, There is no hemoptysis.  No abdominal pain.  She denies any dysuria.  There is no feet swelling.   PAST MEDICAL HISTORY:  Includes:  1. COPD.  2. She also has a history of diabetes mellitus, type 2.  3. History of hypertension.  4. History of morbid obesity.  5. The patient was treated in September last year for pneumonia at      [facility].   FAMILY HISTORY:  Mother has history of diabetes mellitus.  Father is  left with a history of heart disease.   SOCIAL HISTORY:  The patient smokes.  She continues to smoke up to 1  pack of cigarettes per day.  She does not drink alcohol.  She lives with  her sister.   HOME MEDICATIONS:  To include:  1. Lantus 20 units daily.  2. Glucotrol 10 mg p.o. b.i.d.  3. Zocor 40 mg b.i.d.  4. Lexapro 10 mg daily.  5. Zetia 10 mg daily.  6. Lisinopril 20 mg daily.  7. The patient is not able to remember the rest of her medications.   SHE HAS NO KNOWN DRUG ALLERGIES.   PHYSICAL EXAMINATION:  VITAL SIGNS:  Temperature of 102.5 on admission.  She has a blood pressure now of 117/63, pulse rate of 103, respirations  of 22, oxygen saturation was 97%.  GENERAL:  The  patient was having nebs treatment with face mask when I  went to see her.  HEENT:  Head and neck shows pink conjunctivae.  She has no jaundice.  Her neck is supple, and mucous membrane is moist.  CHEST:  Mostly clear respirations.  Auscultation shows bilateral  rhonchi.  CARDIOVASCULAR:  Shows normal heart sounds with no murmur and  no gallop.  ABDOMEN:  Is soft, nontender, no masses palpable.  The patient has  normal bowel sounds.  EXTREMITIES:  Show no edema.  CENTRAL NERVOUS SYSTEM:  The patient is alert and oriented x3.  Her  power is 4 out of 4 in all limbs.  Speech is clear.  There is no focal  deficits.   Initial blood work shows a sodium of 136, potassium of 4.5,  chloride of  107, bicarb of 23, BUN of 19, WBC of 5.9, hemoglobin of 13, hematocrit  of 40, platelets of 151.  BNP is 384 which is mildly elevated.  Chest x-  ray shows possible pneumonia, vascular edema.  The patient has received  some IV Zithromax as well as IV Rocephin.  She is also receiving  albuterol and Atrovent treatment.   ASSESSMENT:  1. Chronic obstructive pulmonary disease exacerbation.  2. Probable pneumonia.  3. History of diabetes mellitus, type 2.  4. History of hypertension.  5. Morbid obesity.   My plan is to admit the patient to telemetry floor.  I will continue IV  Rocephin and Zithromax daily.  Continue albuterol and Atrovent nebs  treatment every 6 hours and every 2 hours p.r.n.  Continue all previous  home medications.  I will repeat CBC in the morning.  I will also repeat  chemistry in the morning.  The patient will be on oxygen at 2 to 4  liters to keep O2 saturation greater than 90%.  She will get IV Solu-  Medrol 60 mg every 8 hours.  The patient will continue all her previous  home medications.      [name], MD  Electronically Signed     GA/MEDQ  D:  05/16/2006  T:  05/17/2006  Job:  [id number]

## 2010-08-25 NOTE — Discharge Summary (Signed)
NAMESHEONA, ALIM NO.:  [id number]   MEDICAL RECORD NO.:  [id number]          PATIENT TYPE:  INP   LOCATION:  0158                         FACILITY:  [facility] [place]   PHYSICIAN:  Jacquelynn Cree, M.D.   DATE OF BIRTH:  [birth year]   DATE OF ADMISSION:  12/02/2004  DATE OF DISCHARGE:  12/06/2004                                 DISCHARGE SUMMARY   DISCHARGE DIAGNOSES:  1.  Community acquired pneumonia.  2.  Chronic obstructive pulmonary disease exacerbation.  3.  Obstructive sleep apnea/obesity hypoventilation syndrome.  4.  Morbid obesity.  5.  Congestive heart failure.  6.  Elevated cardiac markers.  7.  Renal insufficiency.  8.  Hyperlipidemia.  9.  Hyponatremia.  10. Diabetes with uncontrolled hyperglycemia induced by steroids.  11. History of tobacco abuse.   DISCHARGE MEDICATIONS:  1.  Aspirin 81 mg daily.  2.  Mucinex 600 mg b.i.d. p.r.n.  3.  Zocor 20 mg daily.  4.  Zetia 10 mg daily.  5.  Prevacid 30 mg daily.  6.  Altace 2.5 mg daily.  7.  Glucotrol 20 mg twice daily.  8.  Avalox 400 mg once daily x4.  9.  Albuterol and Atrovent handheld nebs for metered dose inhaler q.6h.      p.r.n.  10. Metoprolol 25 mg b.i.d.  11. Prednisone taper.  12. Lantus 30 units subcutaneously q.12h. with instructions to decrease by      10 units q.12h. if sugars are less than 200 x2 readings and to increase      the Lantus by 10 units q.12h. if sugars are greater than 300 on more      than 2 readings.   CONSULTATIONS:  Dr. Esmond Camper of cardiology.   PROCEDURES AND DIAGNOSTIC STUDIES:  1.  Chest x-ray on December 01, 2004. Cardiomegaly without definite failure;      possible acute air space disease on the left.  2.  A 2D echocardiogram on December 04, 2004. Normal left ventricular systolic      function with an ejection fraction estimated to be 55-65%. The study was      inadequate for the evaluation of left ventricular regional wall motion.      Left ventricular wall  thickness was mildly increased.   DISCHARGE LABORATORY VALUES:  BNP was 198 on December 05, 2004; down from 705  on December 02, 2004. Sodium 136, potassium 4.4, chloride 106, bicarb 22,  glucose 104, BUN 32, creatinine 1.8, calcium 8.3. CBC: White blood cell  count 11, hemoglobin 12.6, hematocrit 38.2, platelets 224. MCV was low at  76.1. Total cholesterol 142, triglycerides 116, HDL 45, LDL 74.   BRIEF ADMISSION/HISTORY OF PRESENT ILLNESS:  The patient is a 45 year old  morbidly obese female who was admitted for progressive shortness of breath  over the week prior to her admission. The patient also had had productive  cough with yellow sputum. She was admitted for evaluation of what appeared  to be a community acquired pneumonia, congestive heart failure, and dyspnea  in the setting of morbid obesity.   HOSPITAL COURSE:  Problem 1.  Dyspnea:  The patient was admitted to the ICU  and put on supplemental oxygen. Blood gas analysis revealed hypoxia with a  PO2 of 67.5. Her dyspnea was thought to be multifactorial given her ongoing  tobacco abuse, COPD exacerbation in the setting of a community acquired  pneumonia in addition to obesity hypoventilation syndrome/obstructive sleep  apnea and evidence of acute congestive heart failure likely secondary to  diastolic dysfunction. She did have elevated cardiac markers upon cycling of  the enzymes. She had nonspecific T wave abnormalities in the anterior leads.  Because of this she was seen in evaluation with cardiology. She was not  deemed to be a candidate for cardiac catheterization or Cardiolite given her  excessive weight. She was treated with steroids, nebulizer therapy, empiric  antibiotic treatment with Avelox and a trial of CPAP along with gentle  diuresis. Her respiratory status improved dramatically over the course of  her hospitalization. Her elevated cardiac enzymes were thought to be  secondary to pulmonary disease in the setting of  renal failure and not  necessarily an acute MI though this was impossible to determine definitively  because of limitation of further evaluation by her weight. The patient was  encouraged to wear CPAP at night but had a very difficult time tolerating  it. She is not likely to be compliant with it upon discharge though it was  explained to her how important this therapy was in helping prevent further  complications of cardiac and respiratory disease.   Problem 2.  Renal insufficiency:  The patient did have a creatinine of a  maximum of 2.0. With diuresis this improved to 1.8.   Problem 3.  Hyponatremia:  The patient's sodium corrected with a p.o. fluid  restriction of 1200 cubic centimeters in 24 hours.   Problem 4.  Hyperlipidemia:  The patient's fasting lipid panel was checked  and was found to be well-controlled with her current regimen of Zetia and  Zocor.   Problem 5.  Diabetes and uncontrolled hyperglycemia:  The patient had to be  put on aggressive Insulin therapy with use of an Insulin drip because of  heart rate severe hyperglycemia. It was thought that her hyperglycemia and  relative Insulin resistance was secondary to her steroid use. Her Lantus was  increased to 50 units q.12h. along with sliding scale coverage. Her Insulin  requirement will likely decline with tapering of the steroids. She was  instructed on how to taper her Lantus to avoid overuse of Insulin upon  discharge. Her hemoglobin A1c was 7.6 indicating reasonable control prior to  this incident. She will need further diabetic followup with her primary care  physician upon discharge.   DISPOSITION:  The patient will be discharged home. We will arrange for a  home CPAP machine and nebulizer therapy. She declined any physical therapy  upon discharge along with home health nursing services. She was instructed  on the importance of maintaining her blood glucose at fasting levels of 140 or less. She will receive   smoking cessation and diabetic counseling as an outpatient if she is  agreeable. She is instructed to follow up at [facility] in one weeks'  time. She is instructed to maintain a low carbohydrate, low calorie diet.  She is to increase her activity slowly as tolerated.           ______________________________  Jacquelynn Cree, M.D.     CR/MEDQ  D:  12/06/2004  T:  12/06/2004  Job:  [id number]   cc:   [facility]

## 2010-08-25 NOTE — Discharge Summary (Signed)
Makayla [name], GRIESEL NO.:  [id number]   MEDICAL RECORD NO.:  [id number]          PATIENT TYPE:  INP   LOCATION:  [id number]                         FACILITY:  [facility]   PHYSICIAN:  Jacquelynn Cree, M.D.   DATE OF BIRTH:  03-25-1966   DATE OF ADMISSION:  05/16/2006  DATE OF DISCHARGE:  05/21/2006                               DISCHARGE SUMMARY   PRIMARY CARE PHYSICIAN:  Dr. Simeon Craft at [facility].   DISCHARGE DIAGNOSES:  1. Chronic obstructive pulmonary disease with acute exacerbation.  2. Recent tobacco abuse, quit 2 weeks ago.  3. Elevated cardiac markers.  4. Hypertension.  5. Dyslipidemia.  6. Diabetes, exacerbated by steroid use.  7. Depression.  8. Hyperkalemia, resolved.  9. Dyslipidemia.   DISCHARGE MEDICATIONS:  1. Lantus 50 units subcutaneously q.h.s. while on prednisone,      decreased to 20 units when off prednisone.  2. Glucotrol XL 10 mg b.i.d.  3. Actos 45 mg daily.  4. Zocor 40 mg daily.  5. Zetia 10 mg daily.  6. Metoprolol 25 mg b.i.d.  7. Famotidine 20 mg daily.  8. Prednisone taper 60 mg starting May 22, 2006, decrease by 10      mg daily until off.  9. Avelox 400 mg daily x5 more days.  10.Albuterol metered dose inhaler 2 puffs q. 4 hours p.r.n.   CONSULTATIONS:  [facility].   PROCEDURES AND DIAGNOSTIC STUDIES:  1. Chest X-Ray on May 16, 2006 was a limited film but showed      possible pulmonary edema and possible pneumonia.  2. A 2-D echocardiogram on May 18, 2006 showed normal left      ventricular systolic function with no diagnostic evidence of left      ventricular regional wall motion abnormalities.   DISCHARGE LABORATORY VALUES:  Sodium was 134, potassium 4.6, chloride  101, bicarb 25, BUN 28, creatinine 1.61, glucose 324, white blood cell  count was 6.3, hemoglobin 13.3, hematocrit 39.4, platelets 159.   BRIEF ADMISSION HPI:  The patient is a 45 year old female with COPD and  ongoing tobacco abuse up until  2 weeks prior to presentation, who  presented with cough and shortness of breath.  She also had fever of  102.5 in the emergency department.  For the full details of the HPI,  please see the dictated report done by Dr. Juanito [name].   HOSPITAL COURSE:  Problem:  1. COPD with acute exacerbation:  The patient was admitted and put on      high-dose Solu-Medrol therapy, empiric antibiotic therapy, and      nebulized bronchodilator therapy, along with Mucinex for mucolytic      and antitussives.  She completed 3 days of treatment with Rocephin      and azithromycin, and then her antibiotics were changed p.o. Avelox      which she tolerated well.  Her clinical course was one of steady      improvement.  On exam, she is no longer experiencing any      bronchospasm.  She is stable for discharge and will be discharged  on a prednisone taper, and additional 5 days of treatment with      Avelox for a total course of therapy of 10 days, and ongoing meter      dose inhaler bronchodilator therapy as needed.  She has undergone      tobacco cessation counseling and agrees that she will continue to      avoid tobacco products.  She quit 2 weeks prior to her      hospitalization.  The cardiologist did recommend stopping her ACE      inhibitor, as they felt that it might be contributing to her      respiratory instability.  If a suitable ARB can be provided for her      by [facility], we recommend initiating therapy with an ARB.  Due      to cost constraints, this was not done on discharge, because we do      not have the information available as to what might be covered by      [facility].  2. Elevated cardiac markers:  The patient has a history of elevated      cardiac markers in the past with COPD exacerbation.  A cardiology      consult was requested, given her elevated troponins.  Troponin      elevation was felt to be nonspecific and possibly due to her      pulmonary issues, particularly in  the setting of renal      insufficiency.  A 2-D echocardiogram did not show any evidence of      left ventricular regional wall motion abnormalities, and she had      normal systolic function.  She had mildly elevated BNP on admission      of 384.  Again, in the setting of renal insufficiency, this is a      nonspecific finding..  Cardiologist felt that no further diagnostic      workup was indicated.  The patient did not have any anginal type      symptoms or other chest pain-type symptoms suggestive of acute      coronary syndrome.  At some point, when she is stable from a      respiratory standpoint, she could consider outpatient evaluation      with a stress test.   1. Dyslipidemia:  The patient was continued on her Statin therapy, as      well as her Zetia.  2. Uncontrolled diabetes:  The patient's diabetes was poorly      controlled in the hospital, due to treatment with steroids.  A      hemoglobin A1c was checked and found to be 6.2%, indicating that      overall her glycemic control was excellent.  It is anticipated that      her glycemic control will return to baseline, once she is tapered      off steroids.  Given this, we have temporarily increased her Lantus      dose while on steroids as outlined above.  3. Hypertension:  The patient's blood pressure was fairly well-      controlled during her hospitalization.  Again, we have discontinued      her lisinopril, due to concerns that it may be exacerbating her      respiratory problems.  Consideration to adding an ARB in the      outpatient setting should be considered Dr. Simeon Craft, or one of her  associates at [employer].  4. Depression:  The patient was continued on her usual dose of Lexapro      in-house.  5. Tobacco abuse:  Again, the patient claims to have quit smoking      approximately 2 weeks ago.  Nevertheless, she did undergo formal     tobacco cessation counseling.  The patient has verbalized that she      plans  to stay away from tobacco products on discharge.  6. Hyperkalemia:  The patient's hyperkalemia was self-limited and may      have been due to hemolysis of the specimen.  She had no further      problems of hyperkalemia.   DISPOSITION:  The patient was stable for discharge home.  She should  follow up with [facility] early next week.      Jacquelynn Cree, M.D.  Electronically Signed     CR/MEDQ  D:  05/21/2006  T:  05/21/2006  Job:  [id number]   cc:   Dr. Simeon Craft - [facility]

## 2010-08-25 NOTE — Consult Note (Signed)
NAME:  Makayla [name], Makayla [name] NO.:  [id number]   MEDICAL RECORD NO.:  [id number]          PATIENT TYPE:  INP   LOCATION:  [id number]                         FACILITY:  [facility]   PHYSICIAN:  Ernestine Mcmurray, MD,FACC DATE OF BIRTH:  03-01-1966   DATE OF CONSULTATION:  05/16/2006  DATE OF DISCHARGE:                                 CONSULTATION   BRIEF HISTORY:  Makayla [name] is a 45 year old African American female who  was transported by her family to [facility] Emergency Room with a  syncopal episode this morning.  Makayla [name] describes at least a 2-week  history of increasing chronic shortness of breath and increasing chronic  dyspnea with exertion.  She stated that this all began when she quit  smoking.  She has also developed a cough of white yellow phlegm,  associated wheezing.  She states that her breathing slightly improves  with rest but is not relieved.  She also has a history of chronic  orthopnea which she feels that this is also worse lately and chronic  PND.  She also has a poor sleep habit where she does not sleep very  well, tosses and turns with snoring.  She has not been evaluated for  sleep apnea in the past although she is familiar with it but states that  she had not been referred for evaluation.  In regards to her syncopal  episode this morning, she states that around 4:00 a.m. as usual she was  using the bathroom.  She tried to get up from the bathroom, and the next  thing that she remembers is her family members were trying to pull her  out from the bathroom while she was on a sheet.  She does not recall  hitting the floor.  She denies any injuries.  She denies any pre-chest  discomfort or palpitations.  Her shortness of breath prior and after  that the incident had not changed.  Her family had called the fire  department who initially evaluated her but did not provide transport to  the emergency room.  Her family eventually brought her to the emergency  room later  in the morning.   We are asked to consult secondary to abnormal troponins.   ALLERGIES:  No known drug allergies.   MEDICATIONS PRIOR TO ADMISSION:  Include:  1. Lantus.  2. Glucotrol.  3. Zocor.  4. Lexapro.  5. Zetia.  6. Heartburn medication.   The patient does not know the doses.   Her past medical history is notable for morbid obesity, type 2 diabetes  since 1986 - she does not check her sugars at home, GERD, renal  insufficiency, creatinine mentioned in echart in August 2006 was 1.7.  During that admission in August 2006, she was hospitalized for  pneumonia.  It was noted that her troponins were slightly elevated at  that time at 0.1 and a BNP was elevated at 629.  Hard copies of the  chart are not available.  The patient states that she saw a cardiologist  during that admission.  She also has a  history of hyperlipidemia.  Surgical history is notable for T&A.  She denies any history of  myocardial infarction, CVA, bleeding dyscrasia, thyroid dysfunction.   SOCIAL HISTORY:  She resides in [place] with her mother.  She has  applied for and was declined disability.  She is single without  children.  She quit smoking approximately 2 weeks ago.  Prior to that,  she smoked 1 pack a day for 30 years.  She states that she may have 1 or  2 rum and cokes per month.  She denies any drug use or herbal medication  use.  She does not follow a diet.  She does not exercise.   Her mother is 46 with a history of diabetes.  Her father died at the age  of 81 with cardiac arrest.  She has 4 sisters, 1 brother; all have  hypertension; 1 has diabetes.  She states that there is remote family  history of heart problems and diabetes.   REVIEW OF SYSTEMS:  In addition to above is notable for chills; however,  the patient has not recently checked her temperature.  She states that  she has had a weight gain, but she is not sure how much or how long of a  duration, and she does not weight at  home.  She describes chronic  headaches, chronic sinus problems, reading glasses, edentulous with  dentures at home.  She also describes occasional pedal edema which  resolves at night, palpitations especially associated with exertion.  She thinks that her heart beat has been fast for the preceding 2 days.  Difficult to assess claudication.  Her last menstrual period was in  2008.  She admits to nocturia, numbness in her fingers and toes,  depression/anxiety because of her overall situation, arthralgias all  over,  rare nausea, diarrhea earlier this morning, GERD symptoms.  The  rest of review of systems is unremarkable.   PHYSICAL EXAMINATION:  Pleasant, obese African-American female in mild  respiratory distress.  She is switching physician's constantly, trying  to get comfortable.  T-max is been 102.5, temperature today is 99.9,  blood pressure 107/80, pulse 99, respirations 20, 95% saturation on 2  liters.  Weight today was 194 kg.  HEENT:  Is grossly unremarkable except for edentulous.  NECK:  Supple without thyromegaly, adenopathy, JVD or carotid bruits.  CHEST:  Symmetrical excursion.  LUNG:  Sounds were very diminished with coarse breath sounds appreciated  bilaterally.  I did not appreciate overt wheezing.  HEART:  PMI does not appear displaced.  She has a regular rate and  rhythm with distant heart sounds.  I do not appreciate murmurs, rubs,  clicks or gallops.  Peripheral pulses are symmetrical and intact.  Skin  does not appear to have any active rashes or lesions.  ABDOMEN:  Grossly obese without organomegaly, masses or tenderness.  EXTREMITIES:  Negative cyanosis, clubbing or edema.  Peripheral pulses  are symmetrical and intact.  MUSCULOSKELETAL AND NEUROLOGICAL:  Is grossly unremarkable.   Chest x-ray is very limited, showed diffuse edema versus pneumonia.  Admission H&H is 12.8 of 38.5 with a WBC count of 5.9; MCV was slightly low at 77.6.  An ISTAT in the ER was 136,  potassium 4.5, BUN 19. A  creatinine was not performed.  Initial CK-MB was 898 and 4.5 with a  normal relative index of 0.5.  Troponin was elevated at 1.42 feet.  Blood cultures are pending.  EKG performed on the 7th at 12:30 showed  a  normal sinus rhythm with normal intervals, normal axis, question of arm  lead reversal.  No acute EKG changes   IMPRESSION:  1. Chronic obstructive pulmonary disease exacerbation and probable      pneumonia.  2. Febrile possibly related to #1; however, admission WBC count was      not elevated.  Blood cultures are pending at the time of this      dictation.  3. Abnormal troponins without evidence of acute injury on EKG.  4. Morbid obesity.  5. History of diabetes of uncertain control.  6. Recent tobacco cessation.   DISPOSITION:  Dr. Dannielle Burn has reviewed the patient's history, spoke with  and examined the patient and agrees with the above.  At this time, we  recommend obtaining a CMET to evaluate a complete metabolic panel which  includes LFTs and her renal function.  We agree with treating her  pulmonary issues with Solu-Medrol and Rocephin.  Her echocardiogram  during this admission is limited, however, shows a normal ejection  fraction.  We would not emergently actively pursue an ischemic  evaluation at this time.  Once her pulmonary status improves, we may  consider having a 2-day dobutamine stress marker Myoview performed.  We  will have to check with nuclear medicine in regards to weight limits for  their table and a trial fitting with the cameras due to the patient's  obesity and  body habitus to assure that the test may be performed.  We will follow  her progress along with you.  The patient also needs aggressive cardiac  risk factor modification and counseling in regards to proper nutrition,  diet, exercise and a weight-loss program.      Sharyl Nimrod, PA-C      Ernestine Mcmurray, MD,FACC  Electronically Signed    EW/MEDQ  D:   05/16/2006  T:  05/17/2006  Job:  [id number]   cc:   Karoline Caldwell

## 2010-09-06 ENCOUNTER — Ambulatory Visit: Payer: Medicare Other | Admitting: *Deleted

## 2010-09-27 ENCOUNTER — Encounter: Payer: Medicare Other | Attending: Family Medicine | Admitting: *Deleted

## 2010-09-27 DIAGNOSIS — E119 Type 2 diabetes mellitus without complications: Secondary | ICD-10-CM | POA: Insufficient documentation

## 2010-09-27 DIAGNOSIS — K5289 Other specified noninfective gastroenteritis and colitis: Secondary | ICD-10-CM | POA: Insufficient documentation

## 2010-09-27 DIAGNOSIS — Z713 Dietary counseling and surveillance: Secondary | ICD-10-CM | POA: Insufficient documentation

## 2011-03-17 ENCOUNTER — Emergency Department (HOSPITAL_COMMUNITY)
Admission: EM | Admit: 2011-03-17 | Discharge: 2011-03-17 | Disposition: A | Discharge status: Home / Self Care | Payer: Medicare Other | Attending: Emergency Medicine | Admitting: Emergency Medicine

## 2011-03-17 ENCOUNTER — Encounter: Payer: Self-pay | Admitting: *Deleted

## 2011-03-17 DIAGNOSIS — E119 Type 2 diabetes mellitus without complications: Secondary | ICD-10-CM | POA: Insufficient documentation

## 2011-03-17 DIAGNOSIS — R5383 Other fatigue: Secondary | ICD-10-CM | POA: Insufficient documentation

## 2011-03-17 DIAGNOSIS — I10 Essential (primary) hypertension: Secondary | ICD-10-CM | POA: Insufficient documentation

## 2011-03-17 DIAGNOSIS — R739 Hyperglycemia, unspecified: Secondary | ICD-10-CM

## 2011-03-17 DIAGNOSIS — R5381 Other malaise: Secondary | ICD-10-CM | POA: Insufficient documentation

## 2011-03-17 DIAGNOSIS — Z794 Long term (current) use of insulin: Secondary | ICD-10-CM | POA: Insufficient documentation

## 2011-03-17 DIAGNOSIS — R110 Nausea: Secondary | ICD-10-CM | POA: Insufficient documentation

## 2011-03-17 DIAGNOSIS — R51 Headache: Secondary | ICD-10-CM | POA: Insufficient documentation

## 2011-03-17 HISTORY — DX: Essential (primary) hypertension: I10

## 2011-03-17 LAB — CBC
HCT: 42.3 % (ref 36.0–46.0)
MCHC: 33.6 g/dL (ref 30.0–36.0)
RDW: 13.7 % (ref 11.5–15.5)

## 2011-03-17 LAB — DIFFERENTIAL
Basophils Absolute: 0 10*3/uL (ref 0.0–0.1)
Basophils Relative: 0 % (ref 0–1)
Eosinophils Absolute: 0.3 10*3/uL (ref 0.0–0.7)
Eosinophils Relative: 4 % (ref 0–5)
Monocytes Absolute: 0.8 10*3/uL (ref 0.1–1.0)
Neutro Abs: 3.5 10*3/uL (ref 1.7–7.7)

## 2011-03-17 LAB — COMPREHENSIVE METABOLIC PANEL
AST: 19 U/L (ref 0–37)
Albumin: 3.7 g/dL (ref 3.5–5.2)
Calcium: 9.9 mg/dL (ref 8.4–10.5)
Chloride: 93 mEq/L — ABNORMAL LOW (ref 96–112)
Creatinine, Ser: 1.23 mg/dL — ABNORMAL HIGH (ref 0.50–1.10)
Total Protein: 8.7 g/dL — ABNORMAL HIGH (ref 6.0–8.3)

## 2011-03-17 LAB — GLUCOSE, CAPILLARY: Glucose-Capillary: 494 mg/dL — ABNORMAL HIGH (ref 70–99)

## 2011-03-17 MED ORDER — INSULIN GLARGINE 100 UNIT/ML ~~LOC~~ SOLN: 70.0000 [IU] | Freq: Every day | SUBCUTANEOUS | Status: DC

## 2011-03-17 MED ORDER — INSULIN REGULAR HUMAN 100 UNIT/ML IJ SOLN
10.0000 [IU] | Freq: Once | INTRAMUSCULAR | Status: AC
  Administered 2011-03-17: 10 [IU] via INTRAVENOUS
  Filled 2011-03-17: qty 0.1

## 2011-03-17 MED ORDER — SODIUM CHLORIDE 0.9 % IV SOLN
Freq: Once | INTRAVENOUS | Status: AC
  Administered 2011-03-17: 08:00:00 via INTRAVENOUS

## 2011-03-17 MED ORDER — GLIPIZIDE 10 MG PO TABS: 10.0000 mg | ORAL_TABLET | Freq: Two times a day (BID) | ORAL | Status: DC

## 2011-03-17 NOTE — ED Provider Notes (Signed)
History     CSN: [id number] Arrival date & time: 03/17/2011  6:39 AM   First MD Initiated Contact with Patient 03/17/11 [phone]      Chief Complaint  Patient presents with  . Hyperglycemia  . Headache  . Nausea  . Blurred Vision    (Consider location/radiation/quality/duration/timing/severity/associated sxs/prior treatment) HPI Comments: Patient with history of diabetes.  Out of insulin, oral meds for the past 3 weeks.  Had blood work done yesterday.  Was told to come here because sugar was over 500.  Patient feels weak, mild nausea.    Patient is a 45 y.o. female presenting with headaches. The history is provided by the patient.  Headache  This is a new problem. The current episode started more than 1 week ago. The problem occurs constantly. The problem has been gradually worsening. The headache is associated with nothing.    Past Medical History  Diagnosis Date  . Diabetes mellitus   . Hypertension     Past Surgical History  Procedure Date  . Tonsillectomy     No family history on file.  History  Substance Use Topics  . Smoking status: Current Everyday Smoker  . Smokeless tobacco: Not on file  . Alcohol Use: Yes     occ    OB History    Grav Para Term Preterm Abortions TAB SAB Ect Mult Living                  Review of Systems  Neurological: Positive for headaches.  All other systems reviewed and are negative.    Allergies  Review of patient's allergies indicates no known allergies.  Home Medications  No current outpatient prescriptions on file.  BP 119/68  Pulse 84  Temp(Src) 98.8 F (37.1 C) (Oral)  Resp 20  SpO2 98%  Physical Exam  Nursing note and vitals reviewed. Constitutional: She is oriented to person, place, and time. She appears well-developed and well-nourished. No distress.  HENT:  Head: Normocephalic and atraumatic.  Neck: Normal range of motion. Neck supple.  Cardiovascular: Normal rate and regular rhythm.  Exam reveals no  gallop and no friction rub.   No murmur heard. Pulmonary/Chest: Effort normal and breath sounds normal. No respiratory distress. She has no wheezes.  Abdominal: Soft. Bowel sounds are normal. She exhibits no distension. There is no tenderness.       Obese  Musculoskeletal: Normal range of motion.  Neurological: She is alert and oriented to person, place, and time.  Skin: Skin is warm and dry. She is not diaphoretic.    ED Course  Procedures (including critical care time)  Labs Reviewed  GLUCOSE, CAPILLARY - Abnormal; Notable for the following:    Glucose-Capillary 494 (*)    All other components within normal limits  CBC  DIFFERENTIAL  COMPREHENSIVE METABOLIC PANEL   No results found.   No diagnosis found.    MDM  Labs okay except for sugar.  Is coming down with insulin and ivfs.  Will prescribe diabetes meds and discharge to home.        Veryl Speak, MD 03/17/11 1047

## 2011-03-17 NOTE — ED Notes (Signed)
Spoke with patient at length about signs and symptoms of hyperglycemia and what she should do when she suspects her blood glucose is elevated.  Pt encouraged to check her CBG at least once daily.

## 2011-03-17 NOTE — ED Notes (Signed)
Family at bedside.Pt and family reminded of danger in delay of treatment. Pt was advised 18hrs ago that CBG was elevated and she needed to go to ED. Pt went to bed instead. Sx of severe hyperglycemia reviewed with pt and family

## 2011-03-17 NOTE — ED Notes (Signed)
Pt states she went to her doctor yesterday and was told to come to the ED due to blood sugar over 500, this was at 10 am yesterday, pt presents this am with Headache, nausea and blurred vision. Blood sugar 494 in triage

## 2011-03-17 NOTE — ED Notes (Signed)
IV initiated without trauma. RAC 20 gauge. IV 100 hr

## 2011-08-17 ENCOUNTER — Other Ambulatory Visit: Payer: Self-pay | Admitting: Family Medicine

## 2011-08-17 ENCOUNTER — Other Ambulatory Visit (HOSPITAL_COMMUNITY)
Admission: RE | Admit: 2011-08-17 | Discharge: 2011-08-17 | Disposition: A | Discharge status: Home / Self Care | Payer: Medicare Other | Source: Ambulatory Visit | Attending: Family Medicine | Admitting: Family Medicine

## 2011-08-17 DIAGNOSIS — Z01419 Encounter for gynecological examination (general) (routine) without abnormal findings: Secondary | ICD-10-CM | POA: Insufficient documentation

## 2011-12-16 ENCOUNTER — Emergency Department (HOSPITAL_COMMUNITY)
Admission: EM | Admit: 2011-12-16 | Discharge: 2011-12-16 | Disposition: A | Discharge status: Home / Self Care | Payer: Medicare Other | Attending: Emergency Medicine | Admitting: Emergency Medicine

## 2011-12-16 ENCOUNTER — Encounter (HOSPITAL_COMMUNITY): Payer: Self-pay

## 2011-12-16 DIAGNOSIS — Z794 Long term (current) use of insulin: Secondary | ICD-10-CM | POA: Insufficient documentation

## 2011-12-16 DIAGNOSIS — L0291 Cutaneous abscess, unspecified: Secondary | ICD-10-CM

## 2011-12-16 DIAGNOSIS — I10 Essential (primary) hypertension: Secondary | ICD-10-CM | POA: Insufficient documentation

## 2011-12-16 DIAGNOSIS — E119 Type 2 diabetes mellitus without complications: Secondary | ICD-10-CM | POA: Insufficient documentation

## 2011-12-16 DIAGNOSIS — L0201 Cutaneous abscess of face: Secondary | ICD-10-CM | POA: Insufficient documentation

## 2011-12-16 DIAGNOSIS — F17200 Nicotine dependence, unspecified, uncomplicated: Secondary | ICD-10-CM | POA: Insufficient documentation

## 2011-12-16 DIAGNOSIS — L03211 Cellulitis of face: Secondary | ICD-10-CM | POA: Insufficient documentation

## 2011-12-16 DIAGNOSIS — Z79899 Other long term (current) drug therapy: Secondary | ICD-10-CM | POA: Insufficient documentation

## 2011-12-16 MED ORDER — SULFAMETHOXAZOLE-TRIMETHOPRIM 800-160 MG PO TABS: 2.0000 | ORAL_TABLET | Freq: Two times a day (BID) | ORAL | Status: AC

## 2011-12-16 MED ORDER — BACITRACIN ZINC 500 UNIT/GM EX OINT
TOPICAL_OINTMENT | CUTANEOUS | Status: AC
  Administered 2011-12-16: 1
  Filled 2011-12-16: qty 0.9

## 2011-12-16 NOTE — ED Provider Notes (Signed)
History     CSN: [id number]  Arrival date & time 12/16/11  1606   First MD Initiated Contact with Patient 12/16/11 1902      Chief Complaint  Patient presents with  . Facial Swelling    3 days    (Consider location/radiation/quality/duration/timing/severity/associated sxs/prior treatment) HPI  46 y.o. female iNAD c/o sore to forehead worsening over 2 days. Denies fever, N/V. Pain is 710, exacerbated by palpation.  Past Medical History  Diagnosis Date  . Diabetes mellitus   . Hypertension     Past Surgical History  Procedure Date  . Tonsillectomy     No family history on file.  History  Substance Use Topics  . Smoking status: Current Everyday Smoker  . Smokeless tobacco: Not on file  . Alcohol Use: Yes     occ    OB History    Grav Para Term Preterm Abortions TAB SAB Ect Mult Living                  Review of Systems  Constitutional: Negative for fever.  Respiratory: Negative for shortness of breath.   Cardiovascular: Negative for chest pain.  Gastrointestinal: Negative for nausea, vomiting, abdominal pain and diarrhea.  Skin: Positive for rash.  All other systems reviewed and are negative.    Allergies  Review of patient's allergies indicates no known allergies.  Home Medications   Current Outpatient Rx  Name Route Sig Dispense Refill  . ALBUTEROL SULFATE HFA 108 (90 BASE) MCG/ACT IN AERS Inhalation Inhale 2 puffs into the lungs every 6 (six) hours as needed. For shortness of breath     . CALCITRIOL 0.25 MCG PO CAPS Oral Take 0.25 mcg by mouth 3 (three) times a week. Monday, Wednesday, and Fridays     . FAMOTIDINE 20 MG PO TABS Oral Take 20 mg by mouth daily.      Marland Kitchen FERROUS SULFATE DRIED ER 45 MG PO TBCR Oral Take 1 tablet by mouth daily.      Marland Kitchen GLIPIZIDE 10 MG PO TABS Oral Take 10 mg by mouth 2 (two) times daily.      . INSULIN GLARGINE 100 UNIT/ML [place] SOLN Subcutaneous Inject 70 Units into the skin at bedtime.     Marland Kitchen ROSUVASTATIN CALCIUM 40 MG PO  TABS Oral Take 40 mg by mouth 3 (three) times a week. Monday, Wednesday, and Fridays.    . TRAMADOL HCL 50 MG PO TABS Oral Take 50 mg by mouth 3 (three) times daily as needed. For pain. Maximum dose= 8 tablets per day    . VITAMIN B-12 1000 MCG PO TABS Oral Take 1,000 mcg by mouth daily.      . SULFAMETHOXAZOLE-TRIMETHOPRIM 800-160 MG PO TABS Oral Take 2 tablets by mouth 2 (two) times daily. 28 tablet 0    BP 145/87  Pulse 72  Temp 97.7 F (36.5 C) (Oral)  Resp 16  SpO2 100%  Physical Exam  Nursing note and vitals reviewed. Constitutional: She is oriented to person, place, and time. She appears well-developed and well-nourished. No distress.  HENT:  Head: Normocephalic.  Eyes: Conjunctivae and EOM are normal.  Cardiovascular: Normal rate.   Pulmonary/Chest: Effort normal.  Abdominal: Soft.  Musculoskeletal: Normal range of motion.  Neurological: She is alert and oriented to person, place, and time.  Skin:       1 cm fluctuant abscess to forehead at hairline, minimal surrounding cellulitis  Psychiatric: She has a normal mood and affect.    ED  Course  Procedures (including critical care time)  INCISION AND DRAINAGE Performed by: Monico Blitz Consent: Verbal consent obtained. Risks and benefits: risks, benefits and alternatives were discussed Type: abscess  Body area: Forehead  Anesthesia: local infiltration  Local anesthetic: lidocaine 2% without pinephrine  Anesthetic total:2 ml  Complexity: complex Blunt dissection to break up loculations  Drainage: purulent  Drainage amount: 1  Packing material: None  Patient tolerance: Patient tolerated the procedure well with no immediate complications.    Labs Reviewed - No data to display No results found.   1. Abscess       MDM  Small abscess to forehead. I and D produced scant amount of purulent drainage. Minimal cellulitis but considering location I will start Abx.    New Prescriptions    SULFAMETHOXAZOLE-TRIMETHOPRIM (SEPTRA DS) 800-160 MG PER TABLET    Take 2 tablets by mouth 2 (two) times daily.           Monico Blitz, PA-C 12/16/11 1934

## 2011-12-16 NOTE — ED Notes (Signed)
Pt presents with no acute distress.  C/o of swelling to face x 3 days- denies injury- no other complaints

## 2011-12-18 NOTE — ED Provider Notes (Signed)
Medical screening examination/treatment/procedure(s) were performed by non-physician practitioner and as supervising physician I was immediately available for consultation/collaboration.   Kathalene Frames, MD 12/18/11 [phone]

## 2012-03-17 ENCOUNTER — Other Ambulatory Visit: Payer: Self-pay | Admitting: Internal Medicine

## 2012-03-17 DIAGNOSIS — Z1231 Encounter for screening mammogram for malignant neoplasm of breast: Secondary | ICD-10-CM

## 2012-04-16 ENCOUNTER — Ambulatory Visit: Payer: Medicare Other

## 2012-05-12 ENCOUNTER — Ambulatory Visit
Admission: RE | Admit: 2012-05-12 | Discharge: 2012-05-12 | Disposition: A | Discharge status: Home / Self Care | Payer: Medicare Other | Source: Ambulatory Visit | Attending: Internal Medicine | Admitting: Internal Medicine

## 2012-05-12 DIAGNOSIS — Z1231 Encounter for screening mammogram for malignant neoplasm of breast: Secondary | ICD-10-CM

## 2012-07-29 ENCOUNTER — Encounter (HOSPITAL_COMMUNITY): Payer: Self-pay | Admitting: *Deleted

## 2012-07-29 ENCOUNTER — Emergency Department (HOSPITAL_COMMUNITY)
Admission: EM | Admit: 2012-07-29 | Discharge: 2012-07-29 | Disposition: A | Discharge status: Home / Self Care | Payer: Medicare Other | Attending: Emergency Medicine | Admitting: Emergency Medicine

## 2012-07-29 ENCOUNTER — Emergency Department (HOSPITAL_COMMUNITY): Payer: Medicare Other

## 2012-07-29 DIAGNOSIS — F17200 Nicotine dependence, unspecified, uncomplicated: Secondary | ICD-10-CM | POA: Insufficient documentation

## 2012-07-29 DIAGNOSIS — Z79899 Other long term (current) drug therapy: Secondary | ICD-10-CM | POA: Insufficient documentation

## 2012-07-29 DIAGNOSIS — I10 Essential (primary) hypertension: Secondary | ICD-10-CM | POA: Insufficient documentation

## 2012-07-29 DIAGNOSIS — Z794 Long term (current) use of insulin: Secondary | ICD-10-CM | POA: Insufficient documentation

## 2012-07-29 DIAGNOSIS — E1169 Type 2 diabetes mellitus with other specified complication: Secondary | ICD-10-CM | POA: Insufficient documentation

## 2012-07-29 DIAGNOSIS — L02619 Cutaneous abscess of unspecified foot: Secondary | ICD-10-CM | POA: Insufficient documentation

## 2012-07-29 DIAGNOSIS — L03116 Cellulitis of left lower limb: Secondary | ICD-10-CM

## 2012-07-29 DIAGNOSIS — R739 Hyperglycemia, unspecified: Secondary | ICD-10-CM

## 2012-07-29 LAB — CBC WITH DIFFERENTIAL/PLATELET
HCT: 39.2 % (ref 36.0–46.0)
Hemoglobin: 13.3 g/dL (ref 12.0–15.0)
Lymphocytes Relative: 37 % (ref 12–46)
Lymphs Abs: 2.6 10*3/uL (ref 0.7–4.0)
Monocytes Absolute: 0.7 10*3/uL (ref 0.1–1.0)
Monocytes Relative: 10 % (ref 3–12)
Neutro Abs: 3.5 10*3/uL (ref 1.7–7.7)
WBC: 7 10*3/uL (ref 4.0–10.5)

## 2012-07-29 LAB — BASIC METABOLIC PANEL
BUN: 11 mg/dL (ref 6–23)
Chloride: 100 mEq/L (ref 96–112)
GFR calc Af Amer: 56 mL/min — ABNORMAL LOW (ref >90)
Potassium: 4.2 mEq/L (ref 3.5–5.1)

## 2012-07-29 IMAGING — CR DG FOOT COMPLETE 3+V*L*
3 series · 3 of 3 positions shown · non-contrast
Comparison: None.

CLINICAL DATA: Dorsal foot pain and swelling.

LEFT FOOT - COMPLETE 3+ VIEW

[t foot ap left]
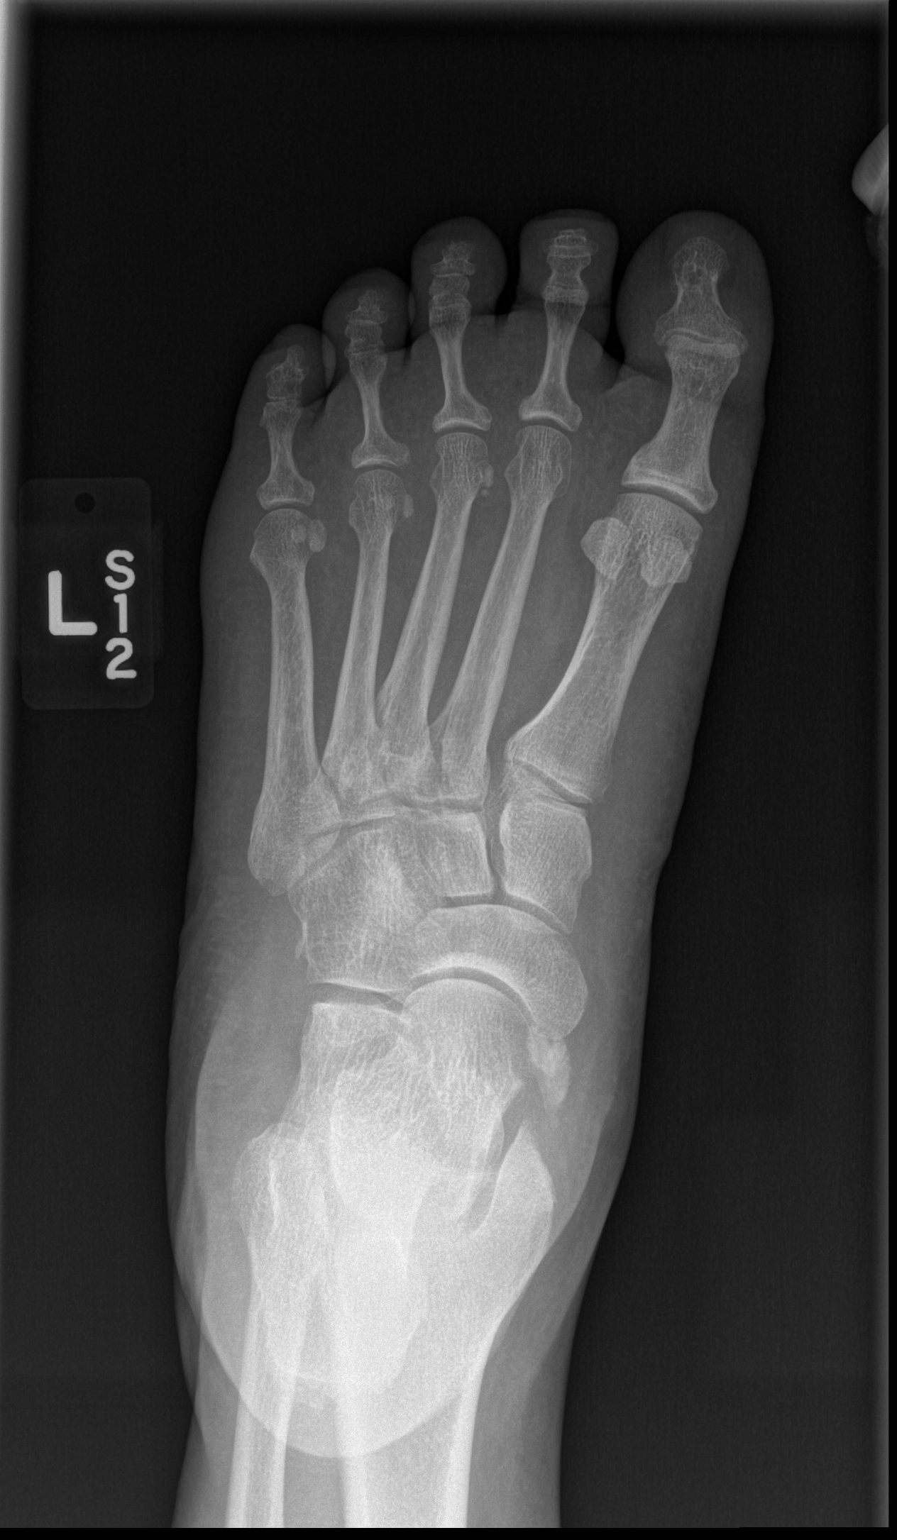

[x foot obl left]
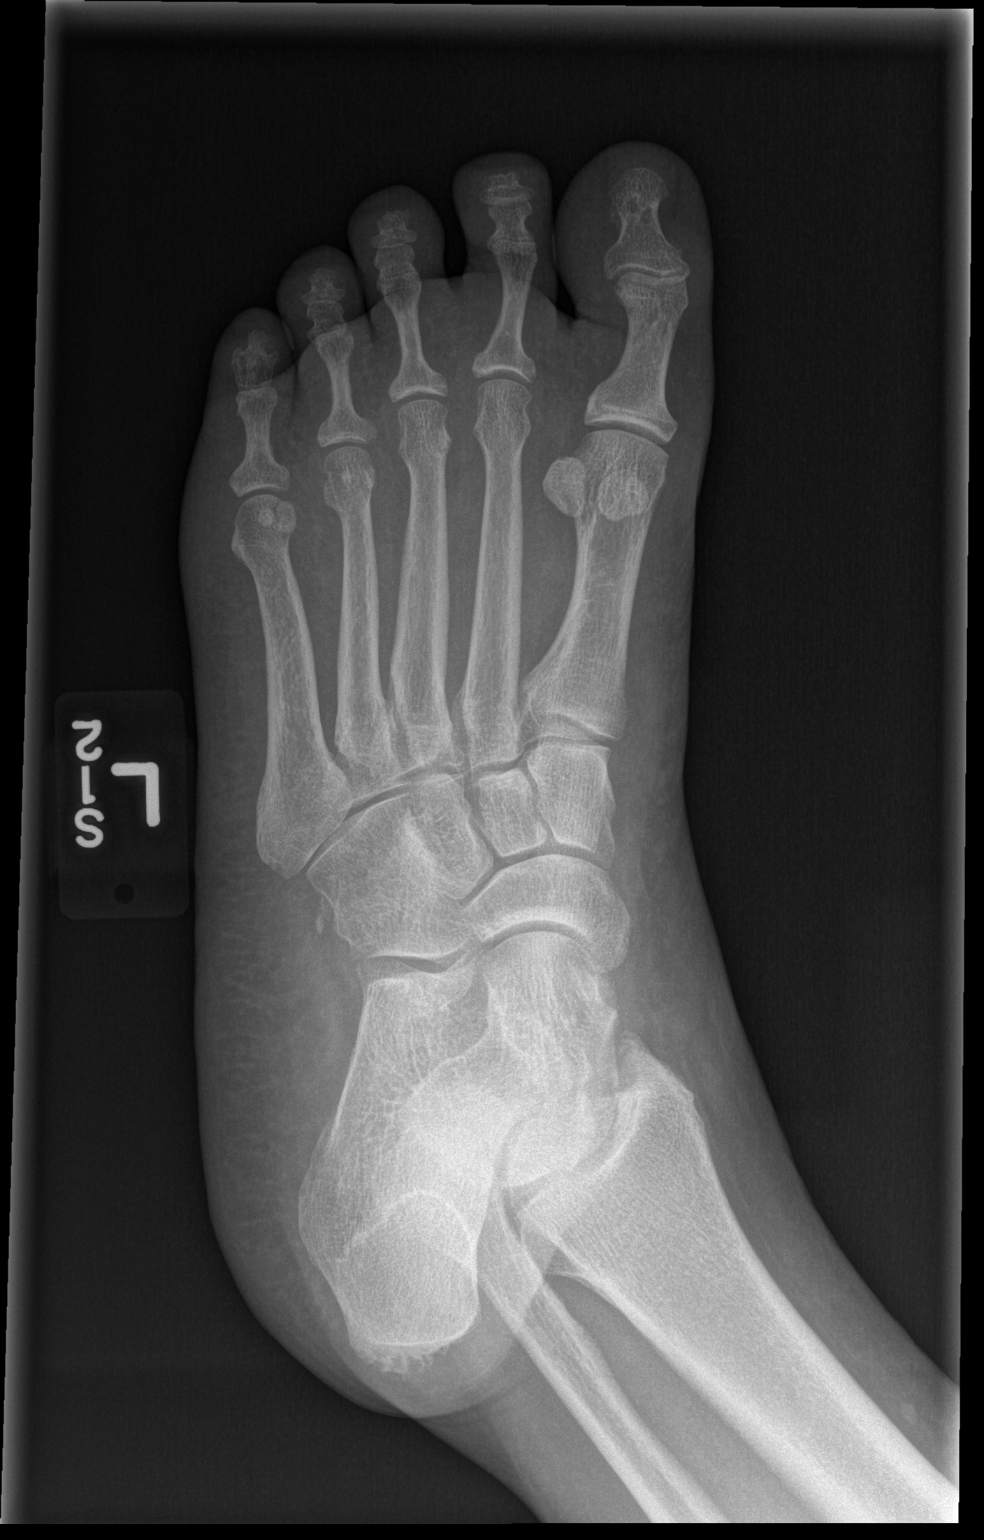

[x foot lat left]
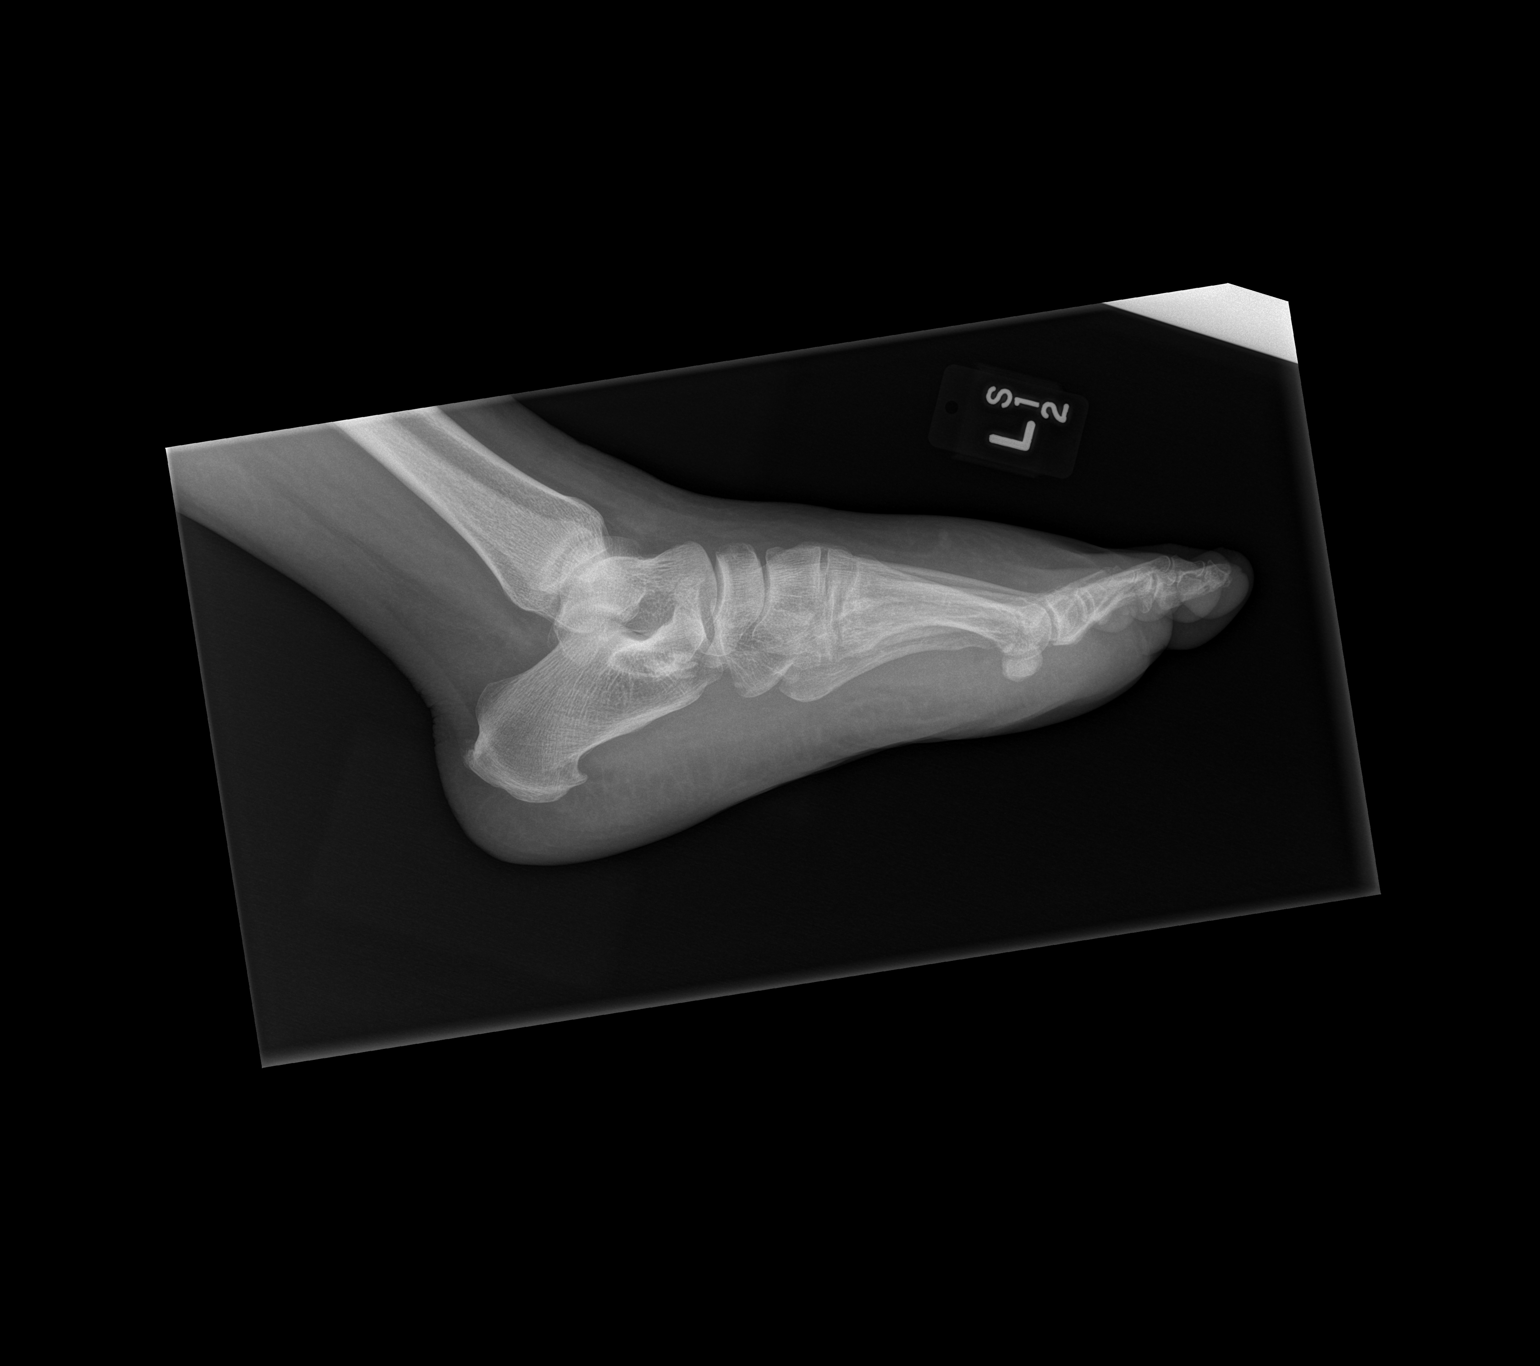

[3 of 3 positions shown; findings below may reference images not displayed]

FINDINGS: There is no acute osseous abnormality.  There are
calcifications in the distal posterior tibialis tendon.  Tiny
posterior plantar calcaneal spurs.  Soft tissue swelling over the
dorsum of the distal foot.

No arthritis or bone destruction.
IMPRESSION: No acute osseous abnormality.

## 2012-07-29 MED ORDER — SULFAMETHOXAZOLE-TRIMETHOPRIM 800-160 MG PO TABS: 1.0000 | ORAL_TABLET | Freq: Two times a day (BID) | ORAL | Status: DC

## 2012-07-29 MED ORDER — HYDROCODONE-ACETAMINOPHEN 5-325 MG PO TABS
1.0000 | ORAL_TABLET | Freq: Once | ORAL | Status: AC
  Administered 2012-07-29: 1 via ORAL
  Filled 2012-07-29: qty 1

## 2012-07-29 MED ORDER — CEPHALEXIN 500 MG PO CAPS: 500.0000 mg | ORAL_CAPSULE | Freq: Four times a day (QID) | ORAL | Status: DC

## 2012-07-29 NOTE — ED Provider Notes (Signed)
History     CSN: [id number]  Arrival date & time 07/29/12  1159   First MD Initiated Contact with Patient 07/29/12 1337      Chief Complaint  Patient presents with  . Foot Pain    (Consider location/radiation/quality/duration/timing/severity/associated sxs/prior treatment) HPI Comments: Patient reports two days of left foot pain with associated swelling and redness.  Pain is sharp, occurs only with palpation and with standing.  Has some relief from ibuprofen and cool pack.  Pt has diabetes mellitus that she believes is well controlled, though she does not check her blood sugars.    Patient is a 47 y.o. female presenting with lower extremity pain. The history is provided by the patient.  Foot Pain Pertinent negatives include no chest pain, chills, coughing, fever, numbness or weakness.    Past Medical History  Diagnosis Date  . Diabetes mellitus   . Hypertension     Past Surgical History  Procedure Laterality Date  . Tonsillectomy      History reviewed. No pertinent family history.  History  Substance Use Topics  . Smoking status: Current Every Day Smoker  . Smokeless tobacco: Not on file  . Alcohol Use: Yes     Comment: occ    OB History   Grav Para Term Preterm Abortions TAB SAB Ect Mult Living                  Review of Systems  Constitutional: Negative for fever and chills.  Respiratory: Negative for cough and shortness of breath.   Cardiovascular: Negative for chest pain.  Skin: Negative for wound.  Neurological: Negative for weakness and numbness.    Allergies  Strawberry  Home Medications   Current Outpatient Rx  Name  Route  Sig  Dispense  Refill  . amLODipine (NORVASC) 5 MG tablet   Oral   Take 5 mg by mouth daily.         . calcitRIOL (ROCALTROL) 0.25 MCG capsule   Oral   Take 0.25 mcg by mouth 3 (three) times a week. Monday, Wednesday, and Fridays         . Cholecalciferol (VITAMIN D) 2000 UNITS tablet   Oral   Take 2,000 Units  by mouth daily.         . famotidine (PEPCID) 20 MG tablet   Oral   Take 20 mg by mouth daily as needed for heartburn.          . Ferrous Sulfate Dried (SLOW RELEASE IRON) 45 MG TBCR   Oral   Take 1 tablet by mouth daily.           . folic acid (FOLVITE) A999333 MCG tablet   Oral   Take 400 mcg by mouth daily.         Marland Kitchen glipiZIDE (GLUCOTROL) 10 MG tablet   Oral   Take 10 mg by mouth 2 (two) times daily.           . hydrochlorothiazide (HYDRODIURIL) 25 MG tablet   Oral   Take 25 mg by mouth daily.         Marland Kitchen HYDROcodone-acetaminophen (NORCO/VICODIN) 5-325 MG per tablet   Oral   Take 1 tablet by mouth every 6 (six) hours as needed for pain.         Marland Kitchen insulin glargine (LANTUS) 100 UNIT/ML injection   Subcutaneous   Inject 70 Units into the skin at bedtime.          . rosuvastatin (CRESTOR)  40 MG tablet   Oral   Take 40 mg by mouth daily.          Marland Kitchen albuterol (PROVENTIL HFA;VENTOLIN HFA) 108 (90 BASE) MCG/ACT inhaler   Inhalation   Inhale 2 puffs into the lungs every 6 (six) hours as needed. For shortness of breath            BP 117/98  Pulse 92  Temp(Src) 98.1 F (36.7 C) (Oral)  Resp 20  SpO2 98%  Physical Exam  Nursing note and vitals reviewed. Constitutional: She appears well-developed and well-nourished. No distress.  HENT:  Head: Normocephalic and atraumatic.  Neck: Neck supple.  Pulmonary/Chest: Effort normal.  Musculoskeletal:       Left ankle: Normal.       Left lower leg: Normal.       Left foot: She exhibits tenderness and swelling. She exhibits normal range of motion, normal capillary refill, no crepitus and no deformity.       Feet:  Neurological: She is alert.  Skin: She is not diaphoretic.    ED Course  Procedures (including critical care time)  Labs Reviewed  BASIC METABOLIC PANEL - Abnormal; Notable for the following:    Glucose, Bld 160 (*)    Creatinine, Ser 1.31 (*)    GFR calc non Af Amer 49 (*)    GFR calc Af  Amer 56 (*)    All other components within normal limits  CBC WITH DIFFERENTIAL - Abnormal; Notable for the following:    RBC 5.16 (*)    MCV 76.0 (*)    MCH 25.8 (*)    All other components within normal limits   Dg Foot Complete Left  07/29/2012  *RADIOLOGY REPORT*  Clinical Data: Dorsal foot pain and swelling.  LEFT FOOT - COMPLETE 3+ VIEW  Comparison: None.  Findings: There is no acute osseous abnormality.  There are calcifications in the distal posterior tibialis tendon.  Tiny posterior plantar calcaneal spurs.  Soft tissue swelling over the dorsum of the distal foot.  No arthritis or bone destruction.  IMPRESSION: No acute osseous abnormality.   Original Report Authenticated By: Lorriane Shire, M.D.      1. Cellulitis of left foot   2. Hyperglycemia       MDM  Diabetic, morbidly obese patient with small area of cellulitis on left lateral foot.  No wound, no ulceration.  Xray negative.  Doubt osteomyelitis.  Pt without systemic symptoms.  She is afebrile, nontoxic.  She does not have calf tenderness and no CP or SOB.  Doubt blood clot.  Discussed return precautions and follow up at length.  Pt to see PCP or ED in two days for recheck.  Return for any worsening symptoms.  D/C home with bactrim, keflex.  Pt has large bottle of vicodin that she has not been taking (for her chronic back pain), that I have advised her to use.  Discussed diagnosis, treatment, follow up with patient.  Pt given return precautions.  Pt verbalizes understanding and agrees with plan.           Clayton Bibles, PA-C 07/29/12 1441

## 2012-07-29 NOTE — ED Notes (Signed)
Pt is here with left foot swelling, pain, warm to touch, and appears red.  Pt is diabetic.  NO ulcers noted.  Pt has good palpable pulse to foot.  Pt not sure if she hit it or not.

## 2012-07-31 NOTE — ED Provider Notes (Signed)
Medical screening examination/treatment/procedure(s) were performed by non-physician practitioner and as supervising physician I was immediately available for consultation/collaboration.   Alfonzo Feller, DO 07/31/12 [phone]

## 2012-12-08 ENCOUNTER — Encounter (HOSPITAL_COMMUNITY): Payer: Self-pay | Admitting: Emergency Medicine

## 2012-12-08 ENCOUNTER — Inpatient Hospital Stay (HOSPITAL_COMMUNITY): Payer: Medicare Other

## 2012-12-08 ENCOUNTER — Inpatient Hospital Stay (HOSPITAL_COMMUNITY)
Admission: EM | Admit: 2012-12-08 | Discharge: 2012-12-11 | DRG: 638 | Disposition: A | Discharge status: Home / Self Care | Payer: Medicare Other | Attending: Internal Medicine | Admitting: Internal Medicine

## 2012-12-08 DIAGNOSIS — R3129 Other microscopic hematuria: Secondary | ICD-10-CM | POA: Diagnosis present

## 2012-12-08 DIAGNOSIS — R7309 Other abnormal glucose: Secondary | ICD-10-CM

## 2012-12-08 DIAGNOSIS — G589 Mononeuropathy, unspecified: Secondary | ICD-10-CM | POA: Diagnosis present

## 2012-12-08 DIAGNOSIS — Z6841 Body Mass Index (BMI) 40.0 and over, adult: Secondary | ICD-10-CM

## 2012-12-08 DIAGNOSIS — Z87891 Personal history of nicotine dependence: Secondary | ICD-10-CM

## 2012-12-08 DIAGNOSIS — E785 Hyperlipidemia, unspecified: Secondary | ICD-10-CM

## 2012-12-08 DIAGNOSIS — E1169 Type 2 diabetes mellitus with other specified complication: Secondary | ICD-10-CM | POA: Diagnosis present

## 2012-12-08 DIAGNOSIS — R112 Nausea with vomiting, unspecified: Secondary | ICD-10-CM | POA: Diagnosis present

## 2012-12-08 DIAGNOSIS — Z794 Long term (current) use of insulin: Secondary | ICD-10-CM

## 2012-12-08 DIAGNOSIS — R0609 Other forms of dyspnea: Secondary | ICD-10-CM

## 2012-12-08 DIAGNOSIS — N183 Chronic kidney disease, stage 3 unspecified: Secondary | ICD-10-CM | POA: Diagnosis present

## 2012-12-08 DIAGNOSIS — E860 Dehydration: Secondary | ICD-10-CM

## 2012-12-08 DIAGNOSIS — I129 Hypertensive chronic kidney disease with stage 1 through stage 4 chronic kidney disease, or unspecified chronic kidney disease: Secondary | ICD-10-CM | POA: Diagnosis present

## 2012-12-08 DIAGNOSIS — Z9119 Patient's noncompliance with other medical treatment and regimen: Secondary | ICD-10-CM

## 2012-12-08 DIAGNOSIS — I152 Hypertension secondary to endocrine disorders: Secondary | ICD-10-CM | POA: Diagnosis present

## 2012-12-08 DIAGNOSIS — E876 Hypokalemia: Secondary | ICD-10-CM

## 2012-12-08 DIAGNOSIS — E870 Hyperosmolality and hypernatremia: Secondary | ICD-10-CM | POA: Diagnosis present

## 2012-12-08 DIAGNOSIS — R739 Hyperglycemia, unspecified: Secondary | ICD-10-CM

## 2012-12-08 DIAGNOSIS — I10 Essential (primary) hypertension: Secondary | ICD-10-CM

## 2012-12-08 DIAGNOSIS — E119 Type 2 diabetes mellitus without complications: Secondary | ICD-10-CM

## 2012-12-08 DIAGNOSIS — E1100 Type 2 diabetes mellitus with hyperosmolarity without nonketotic hyperglycemic-hyperosmolar coma (NKHHC): Secondary | ICD-10-CM

## 2012-12-08 DIAGNOSIS — R5381 Other malaise: Secondary | ICD-10-CM | POA: Diagnosis present

## 2012-12-08 DIAGNOSIS — E1310 Other specified diabetes mellitus with ketoacidosis without coma: Secondary | ICD-10-CM | POA: Diagnosis present

## 2012-12-08 DIAGNOSIS — Z720 Tobacco use: Secondary | ICD-10-CM

## 2012-12-08 DIAGNOSIS — Z91199 Patient's noncompliance with other medical treatment and regimen due to unspecified reason: Secondary | ICD-10-CM

## 2012-12-08 DIAGNOSIS — E871 Hypo-osmolality and hyponatremia: Secondary | ICD-10-CM

## 2012-12-08 DIAGNOSIS — E1159 Type 2 diabetes mellitus with other circulatory complications: Secondary | ICD-10-CM | POA: Diagnosis present

## 2012-12-08 DIAGNOSIS — N179 Acute kidney failure, unspecified: Secondary | ICD-10-CM

## 2012-12-08 DIAGNOSIS — J42 Unspecified chronic bronchitis: Secondary | ICD-10-CM | POA: Diagnosis present

## 2012-12-08 HISTORY — DX: Unspecified chronic bronchitis: J42

## 2012-12-08 HISTORY — DX: Hyperlipidemia, unspecified: E78.5

## 2012-12-08 HISTORY — DX: Cellulitis, unspecified: L03.90

## 2012-12-08 HISTORY — DX: Polyneuropathy, unspecified: G62.9

## 2012-12-08 HISTORY — DX: Chronic kidney disease, stage 3 (moderate): N18.3

## 2012-12-08 HISTORY — DX: Chronic kidney disease, stage 3 unspecified: N18.30

## 2012-12-08 HISTORY — DX: Hypokalemia: E87.6

## 2012-12-08 LAB — CBC WITH DIFFERENTIAL/PLATELET
Basophils Relative: 1 % (ref 0–1)
Eosinophils Absolute: 0.2 10*3/uL (ref 0.0–0.7)
HCT: 38.9 % (ref 36.0–46.0)
Hemoglobin: 13.7 g/dL (ref 12.0–15.0)
MCH: 25.3 pg — ABNORMAL LOW (ref 26.0–34.0)
MCHC: 35.2 g/dL (ref 30.0–36.0)
Monocytes Absolute: 0.6 10*3/uL (ref 0.1–1.0)
Monocytes Relative: 12 % (ref 3–12)

## 2012-12-08 LAB — TSH: TSH: 1.868 u[IU]/mL (ref 0.350–4.500)

## 2012-12-08 LAB — BASIC METABOLIC PANEL
BUN: 18 mg/dL (ref 6–23)
GFR calc Af Amer: 34 mL/min — ABNORMAL LOW (ref >90)
GFR calc non Af Amer: 29 mL/min — ABNORMAL LOW (ref >90)
Potassium: 2.7 mEq/L — CL (ref 3.5–5.1)
Sodium: 127 mEq/L — ABNORMAL LOW (ref 135–145)

## 2012-12-08 LAB — COMPREHENSIVE METABOLIC PANEL
Albumin: 3.9 g/dL (ref 3.5–5.2)
BUN: 20 mg/dL (ref 6–23)
Chloride: 79 mEq/L — ABNORMAL LOW (ref 96–112)
Creatinine, Ser: 2.23 mg/dL — ABNORMAL HIGH (ref 0.50–1.10)
GFR calc Af Amer: 30 mL/min — ABNORMAL LOW (ref >90)
Glucose, Bld: 780 mg/dL (ref 70–99)
Total Bilirubin: 0.9 mg/dL (ref 0.3–1.2)
Total Protein: 8.1 g/dL (ref 6.0–8.3)

## 2012-12-08 LAB — URINALYSIS, ROUTINE W REFLEX MICROSCOPIC
Glucose, UA: >1000 mg/dL — AB
Leukocytes, UA: NEGATIVE
Nitrite: NEGATIVE
Protein, ur: NEGATIVE mg/dL

## 2012-12-08 LAB — GLUCOSE, CAPILLARY
Glucose-Capillary: >600 mg/dL (ref 70–99)
Glucose-Capillary: 487 mg/dL — ABNORMAL HIGH (ref 70–99)
Glucose-Capillary: 404 mg/dL — ABNORMAL HIGH (ref 70–99)
Glucose-Capillary: 273 mg/dL — ABNORMAL HIGH (ref 70–99)

## 2012-12-08 LAB — URINE MICROSCOPIC-ADD ON

## 2012-12-08 LAB — HEMOGLOBIN A1C
Hgb A1c MFr Bld: 14.9 % — ABNORMAL HIGH (ref <5.7)
Mean Plasma Glucose: 381 mg/dL — ABNORMAL HIGH (ref <117)

## 2012-12-08 LAB — GLUCOSE, RANDOM: Glucose, Bld: 585 mg/dL (ref 70–99)

## 2012-12-08 IMAGING — CR DG CHEST 1V PORT
1 series · 1 of 1 positions shown · non-contrast
Comparison: [DATE]

CLINICAL DATA: Cough, shortness of breath

PORTABLE CHEST - 1 VIEW

[AP]
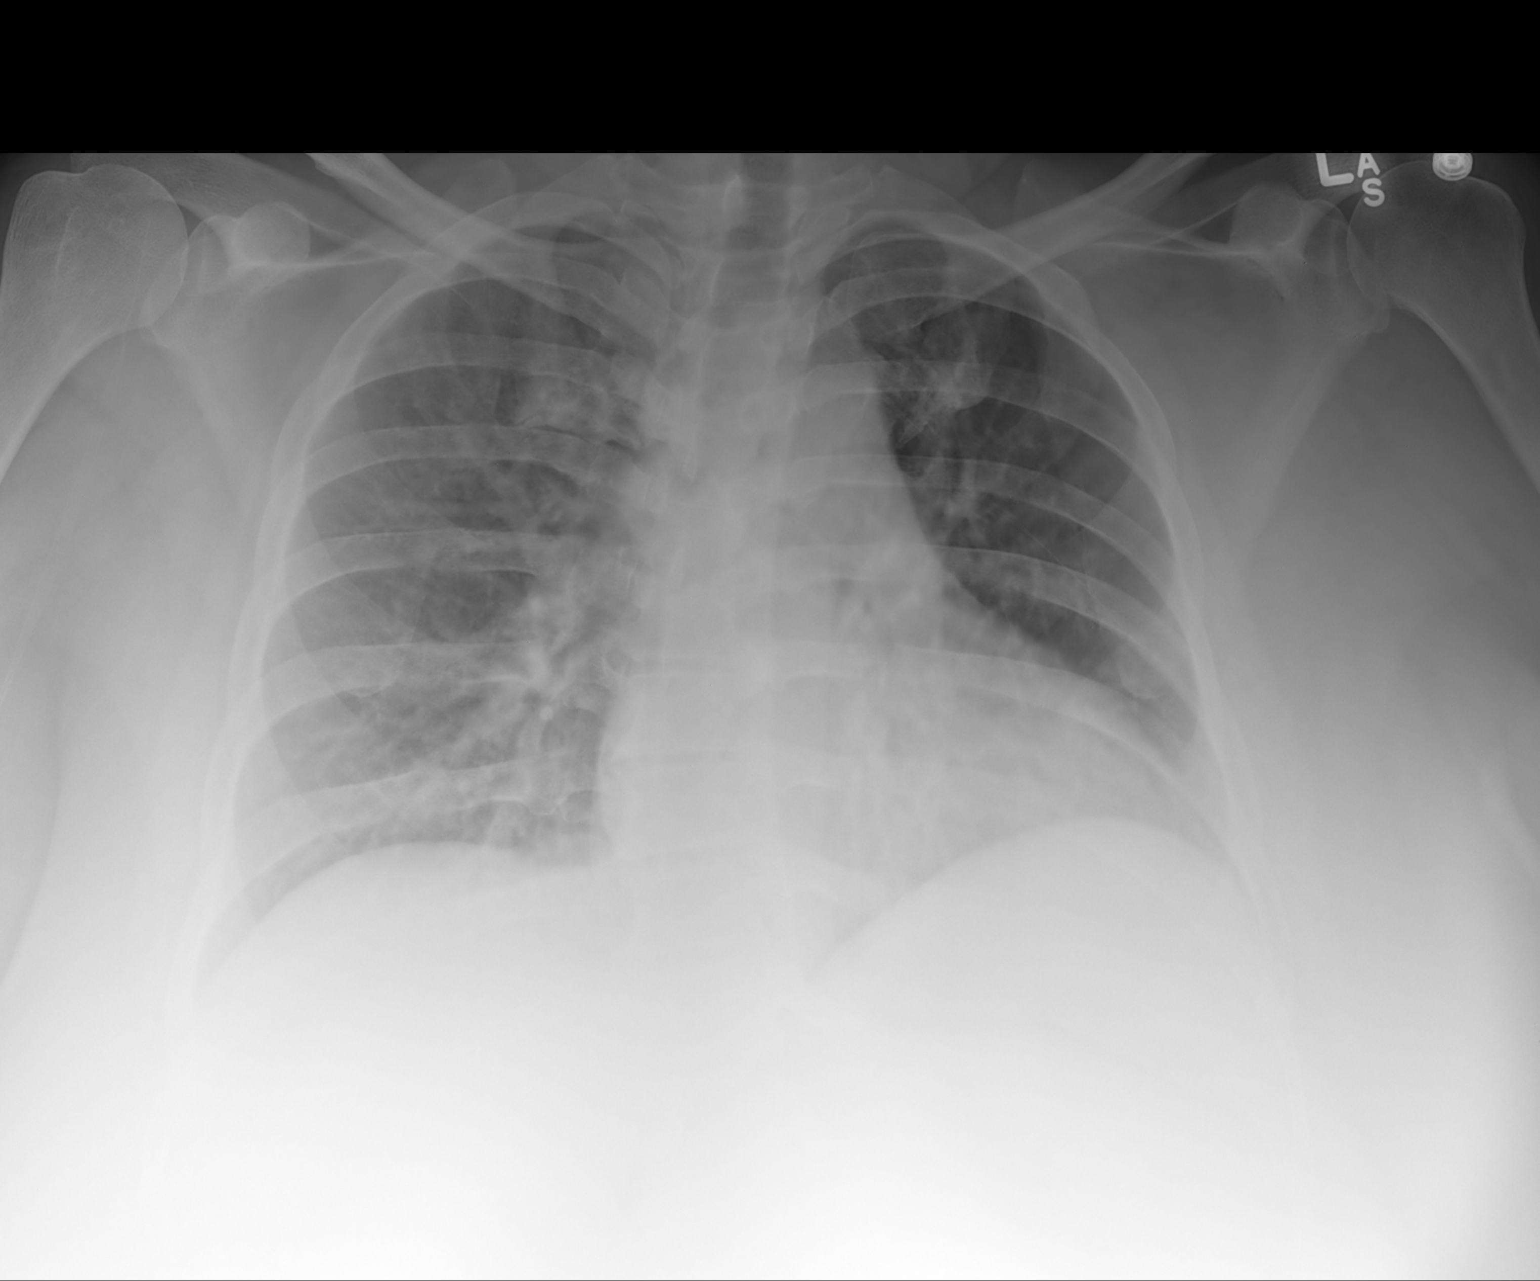

[1 of 1 positions shown; findings below may reference images not displayed]

FINDINGS: Limited exam because of portable technique and obese body
habitus.  Mild cardiac enlargement with vascular and interstitial
prominence.  Mild edema not excluded.  No focal pneumonia, collapse
or consolidation.  No effusion or pneumothorax.  Trachea midline.
IMPRESSION: Cardiomegaly with vascular congestion versus early edema

## 2012-12-08 MED ORDER — CALCITRIOL 0.25 MCG PO CAPS
0.2500 ug | ORAL_CAPSULE | ORAL | Status: DC
  Administered 2012-12-10: 0.25 ug via ORAL
  Filled 2012-12-08 (×2): qty 1

## 2012-12-08 MED ORDER — ACETAMINOPHEN 325 MG PO TABS: 650.0000 mg | ORAL_TABLET | Freq: Four times a day (QID) | ORAL | Status: DC | PRN

## 2012-12-08 MED ORDER — GABAPENTIN 100 MG PO CAPS
100.0000 mg | ORAL_CAPSULE | Freq: Two times a day (BID) | ORAL | Status: DC
  Administered 2012-12-08 – 2012-12-11 (×6): 100 mg via ORAL
  Filled 2012-12-08 (×7): qty 1

## 2012-12-08 MED ORDER — ATORVASTATIN CALCIUM 80 MG PO TABS
80.0000 mg | ORAL_TABLET | Freq: Every day | ORAL | Status: DC
  Administered 2012-12-08 – 2012-12-10 (×3): 80 mg via ORAL
  Filled 2012-12-08 (×4): qty 1

## 2012-12-08 MED ORDER — SODIUM CHLORIDE 0.9 % IV BOLUS (SEPSIS)
1000.0000 mL | Freq: Once | INTRAVENOUS | Status: AC
  Administered 2012-12-08: 1000 mL via INTRAVENOUS

## 2012-12-08 MED ORDER — DEXTROSE 50 % IV SOLN: 25.0000 mL | INTRAVENOUS | Status: DC | PRN

## 2012-12-08 MED ORDER — ENOXAPARIN SODIUM 40 MG/0.4ML ~~LOC~~ SOLN
40.0000 mg | SUBCUTANEOUS | Status: DC
  Administered 2012-12-08: 40 mg via SUBCUTANEOUS
  Filled 2012-12-08 (×2): qty 0.4

## 2012-12-08 MED ORDER — ALBUTEROL SULFATE HFA 108 (90 BASE) MCG/ACT IN AERS: 2.0000 | INHALATION_SPRAY | Freq: Four times a day (QID) | RESPIRATORY_TRACT | Status: DC | PRN

## 2012-12-08 MED ORDER — SODIUM CHLORIDE 0.9 % IV SOLN: INTRAVENOUS | Status: DC

## 2012-12-08 MED ORDER — ACETAMINOPHEN 650 MG RE SUPP: 650.0000 mg | Freq: Four times a day (QID) | RECTAL | Status: DC | PRN

## 2012-12-08 MED ORDER — POTASSIUM CHLORIDE 10 MEQ/100ML IV SOLN
10.0000 meq | INTRAVENOUS | Status: AC
  Administered 2012-12-08 (×3): 10 meq via INTRAVENOUS
  Filled 2012-12-08 (×5): qty 100

## 2012-12-08 MED ORDER — SODIUM CHLORIDE 0.9 % IV SOLN
INTRAVENOUS | Status: DC
  Administered 2012-12-08: 5.4 [IU]/h via INTRAVENOUS
  Administered 2012-12-09: 01:00:00 via INTRAVENOUS
  Filled 2012-12-08 (×2): qty 1

## 2012-12-08 MED ORDER — ONDANSETRON HCL 4 MG PO TABS: 4.0000 mg | ORAL_TABLET | Freq: Four times a day (QID) | ORAL | Status: DC | PRN

## 2012-12-08 MED ORDER — ALUM & MAG HYDROXIDE-SIMETH 200-200-20 MG/5ML PO SUSP
30.0000 mL | Freq: Four times a day (QID) | ORAL | Status: DC | PRN
  Administered 2012-12-11: 30 mL via ORAL
  Filled 2012-12-08: qty 30

## 2012-12-08 MED ORDER — ONDANSETRON 8 MG PO TBDP
8.0000 mg | ORAL_TABLET | Freq: Once | ORAL | Status: AC
Start: 2012-12-08 — End: 2012-12-08
  Administered 2012-12-08: 8 mg via ORAL
  Filled 2012-12-08: qty 1

## 2012-12-08 MED ORDER — HYDROCODONE-ACETAMINOPHEN 5-325 MG PO TABS: 1.0000 | ORAL_TABLET | ORAL | Status: DC | PRN

## 2012-12-08 MED ORDER — AMLODIPINE BESYLATE 5 MG PO TABS
5.0000 mg | ORAL_TABLET | Freq: Every day | ORAL | Status: DC
  Administered 2012-12-09 – 2012-12-11 (×3): 5 mg via ORAL
  Filled 2012-12-08 (×3): qty 1

## 2012-12-08 MED ORDER — DEXTROSE-NACL 5-0.45 % IV SOLN
INTRAVENOUS | Status: DC
  Administered 2012-12-09: 01:00:00 via INTRAVENOUS

## 2012-12-08 MED ORDER — INSULIN REGULAR BOLUS VIA INFUSION
0.0000 [IU] | Freq: Three times a day (TID) | INTRAVENOUS | Status: DC
  Administered 2012-12-08: 10 [IU] via INTRAVENOUS
  Filled 2012-12-08: qty 10

## 2012-12-08 MED ORDER — POTASSIUM CHLORIDE CRYS ER 20 MEQ PO TBCR
40.0000 meq | EXTENDED_RELEASE_TABLET | Freq: Once | ORAL | Status: AC
  Administered 2012-12-08: 40 meq via ORAL
  Filled 2012-12-08: qty 2

## 2012-12-08 MED ORDER — ONDANSETRON HCL 4 MG/2ML IJ SOLN: 4.0000 mg | Freq: Four times a day (QID) | INTRAMUSCULAR | Status: DC | PRN

## 2012-12-08 MED ORDER — INSULIN ASPART 100 UNIT/ML ~~LOC~~ SOLN
10.0000 [IU] | Freq: Once | SUBCUTANEOUS | Status: AC
  Administered 2012-12-08: 10 [IU] via SUBCUTANEOUS
  Filled 2012-12-08: qty 1

## 2012-12-08 MED ORDER — OMEGA-3-ACID ETHYL ESTERS 1 G PO CAPS
1.0000 g | ORAL_CAPSULE | Freq: Two times a day (BID) | ORAL | Status: DC
  Administered 2012-12-08 – 2012-12-11 (×6): 1 g via ORAL
  Filled 2012-12-08 (×7): qty 1

## 2012-12-08 NOTE — ED Notes (Signed)
Pt complains of" nausea x 2 days and heavy vaginal bleeding x 6 days"

## 2012-12-08 NOTE — ED Provider Notes (Signed)
CSN: [id number]     Arrival date & time 12/08/12  1222 History   First MD Initiated Contact with Patient 12/08/12 1234     Chief Complaint  Patient presents with  . Nausea   (Consider location/radiation/quality/duration/timing/severity/associated sxs/prior Treatment) HPI Comments: Patient with history of diabetes on Lantus and glipizide, morbid obesity -- presents with complaint of nausea for 2 days. This has been associated with vomiting whenever she eats solid foods. Vomiting is nonbloody, nonbilious. Patient states that she feels okay when she is at home in her air conditioning but feels sick when she has been moving around in the heat. She has not felt lightheaded or passed out. She has been unable to check her blood sugars recently because she lost her meter during a recent move. She also reports diarrhea but states that this is typical when she gets her menstrual period. She is currently on day 5 of her menstrual period and is having heavy vaginal bleeding which is her normal. She denies chest pain or abdominal pain. No treatments prior to arrival. Onset of symptoms gradual. Course is intermittent. Nothing makes symptoms better or worse.  The history is provided by the patient and medical records.    Past Medical History  Diagnosis Date  . Diabetes mellitus   . Hypertension    Past Surgical History  Procedure Laterality Date  . Tonsillectomy     No family history on file. History  Substance Use Topics  . Smoking status: Current Every Day Smoker  . Smokeless tobacco: Not on file  . Alcohol Use: Yes     Comment: occ   OB History   Grav Para Term Preterm Abortions TAB SAB Ect Mult Living                 Review of Systems  Constitutional: Negative for fever.  HENT: Negative for sore throat and rhinorrhea.   Eyes: Negative for redness.  Respiratory: Negative for cough.   Cardiovascular: Negative for chest pain.  Gastrointestinal: Positive for nausea, vomiting and diarrhea.  Negative for abdominal pain.  Genitourinary: Positive for vaginal bleeding. Negative for dysuria and vaginal discharge.  Musculoskeletal: Negative for myalgias.  Skin: Negative for rash.  Neurological: Negative for headaches.    Allergies  Strawberry  Home Medications   Current Outpatient Rx  Name  Route  Sig  Dispense  Refill  . albuterol (PROVENTIL HFA;VENTOLIN HFA) 108 (90 BASE) MCG/ACT inhaler   Inhalation   Inhale 2 puffs into the lungs every 6 (six) hours as needed. For shortness of breath          . amLODipine (NORVASC) 5 MG tablet   Oral   Take 5 mg by mouth daily.         . calcitRIOL (ROCALTROL) 0.25 MCG capsule   Oral   Take 0.25 mcg by mouth 3 (three) times a week. Monday, Wednesday, and Fridays         . cephALEXin (KEFLEX) 500 MG capsule   Oral   Take 1 capsule (500 mg total) by mouth 4 (four) times daily.   28 capsule   0   . Cholecalciferol (VITAMIN D) 2000 UNITS tablet   Oral   Take 2,000 Units by mouth daily.         . famotidine (PEPCID) 20 MG tablet   Oral   Take 20 mg by mouth daily as needed for heartburn.          . Ferrous Sulfate Dried (SLOW RELEASE IRON)  45 MG TBCR   Oral   Take 1 tablet by mouth daily.           . folic acid (FOLVITE) A999333 MCG tablet   Oral   Take 400 mcg by mouth daily.         Marland Kitchen glipiZIDE (GLUCOTROL) 10 MG tablet   Oral   Take 10 mg by mouth 2 (two) times daily.           . hydrochlorothiazide (HYDRODIURIL) 25 MG tablet   Oral   Take 25 mg by mouth daily.         Marland Kitchen HYDROcodone-acetaminophen (NORCO/VICODIN) 5-325 MG per tablet   Oral   Take 1 tablet by mouth every 6 (six) hours as needed for pain.         Marland Kitchen insulin glargine (LANTUS) 100 UNIT/ML injection   Subcutaneous   Inject 70 Units into the skin at bedtime.          . rosuvastatin (CRESTOR) 40 MG tablet   Oral   Take 40 mg by mouth daily.          Marland Kitchen sulfamethoxazole-trimethoprim (SEPTRA DS) 800-160 MG per tablet   Oral   Take  1 tablet by mouth 2 (two) times daily.   14 tablet   0    BP 117/67  Pulse 91  Temp(Src) 98.9 F (37.2 C) (Oral)  Resp 18  SpO2 99%  LMP 12/08/2012 Physical Exam  Nursing note and vitals reviewed. Constitutional: She appears well-developed and well-nourished.  HENT:  Head: Normocephalic and atraumatic.  Eyes: Conjunctivae are normal. Right eye exhibits no discharge. Left eye exhibits no discharge.  Neck: Normal range of motion. Neck supple.  Cardiovascular: Normal rate, regular rhythm and normal heart sounds.   Pulmonary/Chest: Effort normal and breath sounds normal.  Abdominal: Soft. Bowel sounds are normal. She exhibits no distension. There is tenderness (mild, generalized). There is no rebound and no guarding.  Abdominal exam unreliable due to habitus.   Neurological: She is alert.  Skin: Skin is warm and dry.  Psychiatric: She has a normal mood and affect.    ED Course  Procedures (including critical care time) Labs Review Labs Reviewed  CBC WITH DIFFERENTIAL - Abnormal; Notable for the following:    RBC 5.41 (*)    MCV 71.9 (*)    MCH 25.3 (*)    All other components within normal limits  COMPREHENSIVE METABOLIC PANEL - Abnormal; Notable for the following:    Sodium 120 (*)    Potassium 3.1 (*)    Chloride 79 (*)    Glucose, Bld 780 (*)    Creatinine, Ser 2.23 (*)    GFR calc non Af Amer 26 (*)    GFR calc Af Amer 30 (*)    All other components within normal limits  URINALYSIS, ROUTINE W REFLEX MICROSCOPIC - Abnormal; Notable for the following:    APPearance CLOUDY (*)    Glucose, UA >1000 (*)    Hgb urine dipstick LARGE (*)    All other components within normal limits  URINE MICROSCOPIC-ADD ON   Imaging Review No results found.  12:49 PM Patient seen and examined. Work-up initiated. Medications ordered.   Vital signs reviewed and are as follows: Filed Vitals:   12/08/12 1234  BP: 117/67  Pulse: 91  Temp: 98.9 F (37.2 C)  Resp: 18   2:23 PM  Admit to [facility].    MDM   1. Hyperglycemia without ketosis   2. Hypokalemia  3. Acute renal injury   4. Hyponatremia    Admit with hyperglycemia, no evidence of ketosis -- with associated AKI, hypokalemia/natremia. Will replete potassium prior to starting insulin drip. 10 units SQ insulin ordered.      Carlisle Cater, PA-C 12/08/12 [place], PA-C 12/08/12 1439

## 2012-12-08 NOTE — Progress Notes (Signed)
   Critical value received:  Potassium 2.7  Date of notification:  12/08/2012  Time of notification:  1920  Critical value read back:yes  Nurse who received alert:  Wadsworth Skolnick   Patient in process of getting rounds of potassium.  Janeth Rase, RN

## 2012-12-08 NOTE — Progress Notes (Signed)
Patient declines MyChart activation

## 2012-12-08 NOTE — H&P (Addendum)
[facility] History and Physical  Charnell Brigham [id number] DOB: 02-28-1966 DOA: 12/08/2012  Referring physician: Dr. Elta Guadeloupe PCP: Antonietta Jewel, MD   Chief Complaint: Nausea and vomiting   History of Present Illness: Trianna Madeja is an 47 y.o. female with a PMH of morbid obesity and insulin requiring DM-2 who presents to the hospital with a 24 hour history of nausea and vomiting and dizziness.  Last vomited this morning.  Last took 70 units of Lantus last p.m.  Taking insulin and other DM medications as prescribed.  The patient states she has been more fatigued x 1 week and has had 4 days of polyuria and polydipsia.  States she also has had some dyspnea and a dry cough for the past 2 days.  Lost her glucose meter 2 days ago, so she has not checked her sugars in 2 days, but tells me that her sugars were in the normal range before this.  Upon initial evaluation emergency department, the patient is found to have a blood glucose of 780 and is in a hyperosmolar nonketotic state. She subsequently was referred to the hospitalist service for further evaluation and inpatient treatment.  Review of Systems: Constitutional: No fever, no chills;  Appetite diminished; + weight loss, no weight gain, + fatigue.  HEENT: + blurry vision, no diplopia, no pharyngitis, no dysphagia, + dry mouth CV: No chest pain, no palpitations.  Resp: + SOB, + cough. GI: + nausea, + vomiting, + diarrhea, no melena, no hematochezia.  GU: No dysuria, no hematuria. MSK: no myalgias, no arthralgias.  Neuro:  + headache, no focal neurological deficits, no history of seizures.  Psych: No depression, no anxiety.  Endo: No heat intolerance, no cold intolerance, + polyuria, + polydipsia  Skin: No rashes, no skin lesions.  Heme: No easy bruising.  Past Medical History Past Medical History  Diagnosis Date  . Diabetes mellitus   . Hypertension   . Cellulitis   . Hyperlipidemia   . CKD (chronic kidney disease), stage III   . Neuropathy   .  Chronic bronchitis      Past Surgical History Past Surgical History  Procedure Laterality Date  . Tonsillectomy       Social History: History   Social History  . Marital Status: Single    Spouse Name: N/A    Number of Children: 0  . Years of Education: 12   Occupational History  . Disabled.  CNA prior to disability.    Social History Main Topics  . Smoking status: Current Every Day Smoker -- 1.00 packs/day for 23 years    Types: Cigarettes  . Smokeless tobacco: Not on file  . Alcohol Use: Yes     Comment: Occasional use: 4 drinks per occasion, once a month  . Drug Use: No  . Sexual Activity: Not Currently   Other Topics Concern  . Not on file   Social History Narrative   Single.  Lives alone.  Ambulates with a cane.    Family History:  Family History  Problem Relation Age of Onset  . Diabetes Mother   . Diabetes Father   . Diabetes Sister   . Hypertension Mother   . Hyperlipidemia Mother   . Heart disease Father     Allergies: Strawberry  Meds: Prior to Admission medications   Medication Sig Start Date End Date Taking? Authorizing Provider  albuterol (PROVENTIL HFA;VENTOLIN HFA) 108 (90 BASE) MCG/ACT inhaler Inhale 2 puffs into the lungs every 6 (six) hours as needed. For shortness  of breath    Yes Historical Provider, MD  amLODipine (NORVASC) 5 MG tablet Take 5 mg by mouth daily.   Yes Historical Provider, MD  calcitRIOL (ROCALTROL) 0.25 MCG capsule Take 0.25 mcg by mouth 3 (three) times a week. Monday, Wednesday, and Fridays   Yes Historical Provider, MD  gabapentin (NEURONTIN) 100 MG capsule Take 100 mg by mouth 2 (two) times daily.   Yes Historical Provider, MD  glipiZIDE (GLUCOTROL) 10 MG tablet Take 10 mg by mouth 2 (two) times daily.     Yes Historical Provider, MD  hydrochlorothiazide (HYDRODIURIL) 25 MG tablet Take 25 mg by mouth daily.   Yes Historical Provider, MD  insulin glargine (LANTUS) 100 UNIT/ML injection Inject 70 Units into the skin at  bedtime.    Yes Historical Provider, MD  omega-3 acid ethyl esters (LOVAZA) 1 G capsule Take 1 g by mouth 2 (two) times daily.   Yes Historical Provider, MD  rosuvastatin (CRESTOR) 40 MG tablet Take 40 mg by mouth daily.    Yes Historical Provider, MD    Physical Exam: Filed Vitals:   12/08/12 1234  BP: 117/67  Pulse: 91  Temp: 98.9 F (37.2 C)  TempSrc: Oral  Resp: 18  SpO2: 99%     Physical Exam: Blood pressure 117/67, pulse 91, temperature 98.9 F (37.2 C), temperature source Oral, resp. rate 18, last menstrual period 12/08/2012, SpO2 99.00%. Gen: No acute distress. Morbidly obese. Head: Normocephalic, atraumatic. Eyes: PERRL, EOMI, sclerae nonicteric. Mouth: Oropharynx reveals dry mucous membranes. The patient is edentulous. Neck: Supple, no thyromegaly, no lymphadenopathy, no jugular venous distention. Chest: Lungs are diminished throughout. CV: Heart sounds are regular with a soft grade 2/6 systolic ejection murmur best heard at the left upper sternal border. Abdomen: Soft, nontender, nondistended with normal active bowel sounds. Extremities: Extremities are without obvious clubbing, cyanosis, or edema. Skin: Warm and dry. Neuro: Alert and oriented times 3; cranial nerves II through XII grossly intact. Psych: Mood and affect normal.  Labs on Admission:  Basic Metabolic Panel:  Recent Labs Lab 12/08/12 1300  NA 120*  K 3.1*  CL 79*  CO2 29  GLUCOSE 780*  BUN 20  CREATININE 2.23*  CALCIUM 9.5   Liver Function Tests:  Recent Labs Lab 12/08/12 1300  AST 16  ALT 11  ALKPHOS 94  BILITOT 0.9  PROT 8.1  ALBUMIN 3.9   CBC:  Recent Labs Lab 12/08/12 1300  WBC 4.7  NEUTROABS 2.1  HGB 13.7  HCT 38.9  MCV 71.9*  PLT 151    Radiological Exams on Admission: No results found.  Assessment/Plan Principal Problem:   Hyperosmolar non-ketotic state in patient with type 2 diabetes mellitus DKA or HHS -Start/continue aggressive fluid replacement to  correct both hypovolemia and hyperosmolality with NS infused at a rate 250 cc per hour.  Add dextrose to the saline solution when the serum glucose reaches 250 to 300 mg/dL. -Start an insulin drip per glucommander protocol to correct hyperglycemia. -Monitor CBGs hourly until stable. -Monitor BMET every 4 hours until stable. -Transition to basal/bolus insulin when blood glucoses fall within the protocol range. Continue IV insulin infusion for one to two hours after initiating the SQ insulin, to avoid recurrent hyperglycemia. -Check hemoglobin A1c. Diabetes coordinator consultation requested. Active Problems:   Tobacco abuse -Counseled.   Chronic bronchitis -Continue bronchodilator therapy as needed. -Portable chest x-ray to rule out pneumonia given complaint of shortness of breath and cough.   HYPERLIPIDEMIA -Continue Lovaza and statin therapy.  HYPERTENSION -Continue Norvasc.   Hypokalemia -Given 40 mEq of oral potassium and 3 potassium runs prior to the initiation of the insulin drip.   ARF (acute renal failure) / stage III chronic kidney disease -Continue calcitriol.  -Acute renal failure secondary to osmotic diuresis from significant hyperglycemia and dehydration. -Aggressive fluid rehydration ordered. Baseline creatinine 1.2-1.3   Dehydration -Aggressive fluids as noted above.   Obesity, morbid -Dietitian consultation requested.   Neuropathy -Continue Neurontin.   Code Status: Full. Family Communication: Sister at bedside. Disposition Plan: Home when stable.  Time spent: 70 minutes.  RAMA,CHRISTINA [facility] Pager [phone]  If 7PM-7AM, please contact night-coverage [email or web address] Password [facility] 12/08/2012, 2:57 PM

## 2012-12-09 DIAGNOSIS — E860 Dehydration: Secondary | ICD-10-CM

## 2012-12-09 DIAGNOSIS — N179 Acute kidney failure, unspecified: Secondary | ICD-10-CM

## 2012-12-09 LAB — BASIC METABOLIC PANEL
BUN: 16 mg/dL (ref 6–23)
BUN: 18 mg/dL (ref 6–23)
CO2: 32 mEq/L (ref 19–32)
CO2: 30 mEq/L (ref 19–32)
CO2: 29 mEq/L (ref 19–32)
CO2: 28 mEq/L (ref 19–32)
CO2: 28 mEq/L (ref 19–32)
Calcium: 9.7 mg/dL (ref 8.4–10.5)
Calcium: 9.4 mg/dL (ref 8.4–10.5)
Calcium: 9.1 mg/dL (ref 8.4–10.5)
Chloride: 93 mEq/L — ABNORMAL LOW (ref 96–112)
Chloride: 91 mEq/L — ABNORMAL LOW (ref 96–112)
Chloride: 88 mEq/L — ABNORMAL LOW (ref 96–112)
Chloride: 88 mEq/L — ABNORMAL LOW (ref 96–112)
Creatinine, Ser: 2.04 mg/dL — ABNORMAL HIGH (ref 0.50–1.10)
Creatinine, Ser: 1.92 mg/dL — ABNORMAL HIGH (ref 0.50–1.10)
Creatinine, Ser: 2 mg/dL — ABNORMAL HIGH (ref 0.50–1.10)
Creatinine, Ser: 2.02 mg/dL — ABNORMAL HIGH (ref 0.50–1.10)
GFR calc Af Amer: 33 mL/min — ABNORMAL LOW (ref >90)
GFR calc Af Amer: 34 mL/min — ABNORMAL LOW (ref >90)
GFR calc Af Amer: 33 mL/min — ABNORMAL LOW (ref >90)
GFR calc non Af Amer: 29 mL/min — ABNORMAL LOW (ref >90)
Glucose, Bld: 183 mg/dL — ABNORMAL HIGH (ref 70–99)
Glucose, Bld: 510 mg/dL — ABNORMAL HIGH (ref 70–99)
Glucose, Bld: 522 mg/dL — ABNORMAL HIGH (ref 70–99)
Potassium: 2.9 mEq/L — ABNORMAL LOW (ref 3.5–5.1)
Potassium: 3 mEq/L — ABNORMAL LOW (ref 3.5–5.1)
Potassium: 2.9 mEq/L — ABNORMAL LOW (ref 3.5–5.1)
Potassium: 3.1 mEq/L — ABNORMAL LOW (ref 3.5–5.1)
Sodium: 135 mEq/L (ref 135–145)
Sodium: 130 mEq/L — ABNORMAL LOW (ref 135–145)
Sodium: 127 mEq/L — ABNORMAL LOW (ref 135–145)
Sodium: 126 mEq/L — ABNORMAL LOW (ref 135–145)

## 2012-12-09 LAB — GLUCOSE, CAPILLARY
Glucose-Capillary: 155 mg/dL — ABNORMAL HIGH (ref 70–99)
Glucose-Capillary: 158 mg/dL — ABNORMAL HIGH (ref 70–99)
Glucose-Capillary: 188 mg/dL — ABNORMAL HIGH (ref 70–99)
Glucose-Capillary: 192 mg/dL — ABNORMAL HIGH (ref 70–99)
Glucose-Capillary: 220 mg/dL — ABNORMAL HIGH (ref 70–99)
Glucose-Capillary: 387 mg/dL — ABNORMAL HIGH (ref 70–99)
Glucose-Capillary: 502 mg/dL — ABNORMAL HIGH (ref 70–99)

## 2012-12-09 MED ORDER — INSULIN GLARGINE 100 UNIT/ML ~~LOC~~ SOLN
70.0000 [IU] | Freq: Every day | SUBCUTANEOUS | Status: DC
  Filled 2012-12-09: qty 0.7

## 2012-12-09 MED ORDER — POTASSIUM CHLORIDE CRYS ER 20 MEQ PO TBCR
40.0000 meq | EXTENDED_RELEASE_TABLET | Freq: Once | ORAL | Status: AC
  Administered 2012-12-09: 40 meq via ORAL
  Filled 2012-12-09: qty 2

## 2012-12-09 MED ORDER — ENOXAPARIN SODIUM 80 MG/0.8ML ~~LOC~~ SOLN
80.0000 mg | SUBCUTANEOUS | Status: DC
  Administered 2012-12-09 – 2012-12-10 (×2): 80 mg via SUBCUTANEOUS
  Filled 2012-12-09 (×3): qty 0.8

## 2012-12-09 MED ORDER — SODIUM CHLORIDE 0.9 % IV BOLUS (SEPSIS)
500.0000 mL | Freq: Once | INTRAVENOUS | Status: AC
  Administered 2012-12-09: 500 mL via INTRAVENOUS

## 2012-12-09 MED ORDER — INSULIN ASPART 100 UNIT/ML ~~LOC~~ SOLN
15.0000 [IU] | Freq: Once | SUBCUTANEOUS | Status: AC
  Administered 2012-12-09: 15 [IU] via SUBCUTANEOUS

## 2012-12-09 MED ORDER — LIVING WELL WITH DIABETES BOOK
Freq: Once | Status: AC
  Administered 2012-12-09: 12:00:00
  Filled 2012-12-09: qty 1

## 2012-12-09 MED ORDER — INSULIN ASPART 100 UNIT/ML ~~LOC~~ SOLN
0.0000 [IU] | Freq: Three times a day (TID) | SUBCUTANEOUS | Status: DC
  Administered 2012-12-09: 4 [IU] via SUBCUTANEOUS
  Administered 2012-12-09 (×2): 20 [IU] via SUBCUTANEOUS
  Administered 2012-12-09: 4 [IU] via SUBCUTANEOUS
  Administered 2012-12-10: 15 [IU] via SUBCUTANEOUS
  Administered 2012-12-10: 20 [IU] via SUBCUTANEOUS

## 2012-12-09 MED ORDER — POTASSIUM CHLORIDE 10 MEQ/100ML IV SOLN
10.0000 meq | INTRAVENOUS | Status: AC
  Administered 2012-12-09 – 2012-12-10 (×3): 10 meq via INTRAVENOUS
  Filled 2012-12-09 (×3): qty 100

## 2012-12-09 MED ORDER — INSULIN GLARGINE 100 UNIT/ML ~~LOC~~ SOLN
70.0000 [IU] | Freq: Every day | SUBCUTANEOUS | Status: DC
  Administered 2012-12-09: 70 [IU] via SUBCUTANEOUS
  Filled 2012-12-09 (×2): qty 0.7

## 2012-12-09 NOTE — Progress Notes (Signed)
Nutrition Education Note  RD consulted for nutrition education regarding diabetes.   Lab Results  Component Value Date   HGBA1C 14.9* 12/08/2012    RD provided "Carbohydrate Counting for People with Diabetes" handout from the Academy of Nutrition and Dietetics. Discussed different food groups and their effects on blood sugar, emphasizing carbohydrate-containing foods. Provided list of carbohydrates and recommended serving sizes of common foods.  Discussed importance of controlled and consistent carbohydrate intake throughout the day. Provided examples of ways to balance meals/snacks and encouraged intake of high-fiber, whole grain complex carbohydrates. Teach back method used.  Pt admits her 2 weaknesses are cigarettes and soda. Admits to drinking 2-3 cans of regular soda/day. Also reports not eating lunch. Discussed importance of healthy beverage choices and ideas for healthy lunches were discussed.   Expect fair compliance.  Body mass index is 71.11 kg/(m^2). Pt meets criteria for morbid obesity based on current BMI.  Current diet order is CHO modified, patient is consuming approximately 100% of meals at this time. Labs and medications reviewed. No further nutrition interventions warranted at this time. RD contact information provided. If additional nutrition issues arise, please re-consult RD.  Mikey College MS, [place], [place] Pager [phone] After Hours Pager

## 2012-12-09 NOTE — Progress Notes (Signed)
[facility] PROGRESS NOTE  Makayla [name] [id number] DOB: 1966/01/25 DOA: 12/08/2012 PCP: Antonietta Jewel, MD  Assessment/Plan: HONK -Resolved. -Now on lantus/ SSI. -Continue to titrate according to CBGs..  Tobacco abuse  -Counseled.   Chronic bronchitis  -Continue bronchodilator therapy as needed.   HYPERLIPIDEMIA  -Continue Lovaza and statin therapy.   HYPERTENSION  -Continue Norvasc.   Hypokalemia  -Continue to replete. -Check Mag level.  ARF (acute renal failure) / stage III chronic kidney disease  -Continue calcitriol.  -Acute renal failure secondary to osmotic diuresis from significant hyperglycemia and dehydration.  -Aggressive fluid rehydration ordered. Baseline creatinine 1.2-1.3   Dehydration  -Aggressive fluids as noted above.   Obesity, morbid  -Dietitian consultation requested.   Neuropathy  -Continue Neurontin.      Code Status: Full Code Family Communication: Patient only  Disposition Plan: Home when medically stable, hopefully in 24 hours.   Consultants:  None   Antibiotics:  None   Subjective: No complaints.  Objective: Filed Vitals:   12/08/12 2300 12/09/12 0355 12/09/12 0514 12/09/12 1424  BP: 101/60  95/61 109/63  Pulse: 77  71 90  Temp: 98 F (36.7 C)  98.2 F (36.8 C) 98.7 F (37.1 C)  TempSrc: Oral  Oral Axillary  Resp: 18  18   Height:      Weight:  188 kg (414 lb 7.4 oz)    SpO2: 95%  93% 94%    Intake/Output Summary (Last 24 hours) at 12/09/12 1740 Last data filed at 12/09/12 1423  Gross per 24 hour  Intake 1844.59 ml  Output      1 ml  Net 1843.59 ml   Filed Weights   12/08/12 1532 12/09/12 0355  Weight: 224.531 kg (495 lb) 188 kg (414 lb 7.4 oz)    Exam:   General:  AA Ox3   Cardiovascular: RRR  Respiratory: CTA B  Abdomen: obese/S/NT/ND/+BS  Extremities: no C/C/E/+pulses   Neurologic:  Grossly intact and non-focal  Data Reviewed: Basic Metabolic Panel:  Recent Labs Lab  12/08/12 2326 12/09/12 0342 12/09/12 0750 12/09/12 1105 12/09/12 1430  NA 132* 135 133* 130* 127*  K 2.9* 2.9* 2.8* 3.0* 2.9*  CL 93* 96 94* 91* 88*  CO2 31 32 30 29 28   GLUCOSE 283* 136* 183* 332* 510*  BUN 18 18 16 16 17   CREATININE 2.04* 2.00* 1.92* 2.00* 2.08*  CALCIUM 9.7 9.7 9.4 9.2 9.3   Liver Function Tests:  Recent Labs Lab 12/08/12 1300  AST 16  ALT 11  ALKPHOS 94  BILITOT 0.9  PROT 8.1  ALBUMIN 3.9   No results found for this basename: LIPASE, AMYLASE,  in the last 168 hours No results found for this basename: AMMONIA,  in the last 168 hours CBC:  Recent Labs Lab 12/08/12 1300  WBC 4.7  NEUTROABS 2.1  HGB 13.7  HCT 38.9  MCV 71.9*  PLT 151   Cardiac Enzymes: No results found for this basename: CKTOTAL, CKMB, CKMBINDEX, TROPONINI,  in the last 168 hours BNP (last 3 results) No results found for this basename: PROBNP,  in the last 8760 hours CBG:  Recent Labs Lab 12/09/12 0509 12/09/12 0603 12/09/12 0733 12/09/12 1230 12/09/12 1650  GLUCAP 152* 155* 188* 387* 535*    No results found for this or any previous visit (from the past 240 hour(s)).   Studies: Dg Chest Port 1 View  12/08/2012   *RADIOLOGY REPORT*  Clinical Data: Cough, shortness of breath  PORTABLE CHEST - 1 VIEW  Comparison: 05/16/2006  Findings: Limited exam because of portable technique and obese body habitus.  Mild cardiac enlargement with vascular and interstitial prominence.  Mild edema not excluded.  No focal pneumonia, collapse or consolidation.  No effusion or pneumothorax.  Trachea midline.  IMPRESSION: Cardiomegaly with vascular congestion versus early edema   Original Report Authenticated By: Jerilynn Mages. Shick, M.D.    Scheduled Meds: . amLODipine  5 mg Oral Daily  . atorvastatin  80 mg Oral q1800  . [START ON 12/10/2012] calcitRIOL  0.25 mcg Oral 3 times weekly  . enoxaparin (LOVENOX) injection  80 mg Subcutaneous Q24H  . gabapentin  100 mg Oral BID  . insulin aspart  0-20  Units Subcutaneous TID WC  . insulin glargine  70 Units Subcutaneous QHS  . omega-3 acid ethyl esters  1 g Oral BID  . potassium chloride  40 mEq Oral Once   Continuous Infusions:   Principal Problem:   Hyperosmolar non-ketotic state in patient with type 2 diabetes mellitus Active Problems:   HYPERLIPIDEMIA   HYPERTENSION   Hypokalemia   ARF (acute renal failure)   Dehydration   Obesity, morbid   Tobacco abuse    Time spent: 35 minutes    HERNANDEZ ACOSTA,ESTELA  [facility] Pager [phone]  If 7PM-7AM, please contact night-coverage at [email or web address], password [facility] 12/09/2012, 5:40 PM  LOS: 1 day

## 2012-12-09 NOTE — Progress Notes (Signed)
Paged MD Jerilee Hoh regarding patient's cbg of [phone] will obtain Lab verification. Neta Mends RN 12-09-2012 17:22pm

## 2012-12-09 NOTE — Progress Notes (Signed)
Latest potassium 2.9. Taken during last potassium run. Genieve Ramaswamy RN

## 2012-12-09 NOTE — Progress Notes (Signed)
Orders received from MD Jerilee Hoh to cancel lab serum glucose and give patient 20 units Neta Mends RN 17:42pm 12-09-2012

## 2012-12-09 NOTE — Evaluation (Signed)
Physical Therapy Evaluation Patient Details Name: Makayla [name] MRN: [id number] DOB: [birth year] Today's Date: 12/09/2012 Time: [id number] PT Time Calculation (min): 27 min  PT Assessment / Plan / Recommendation History of Present Illness  Pt admitted 12/08/12 with fatigue, h/o DM. found to have blood glucose > 700 in ED.   Clinical Impression  Pt was motivated to ambulate in hall x 400'. Pt will benefit from PT while in acute care for increased activity and instruct in Home exercise program.pt may benefit from HHPT vs outpt for exercise program.    PT Assessment  Patient needs continued PT services    Follow Up Recommendations  Home health PT    Does the patient have the potential to tolerate intense rehabilitation      Barriers to Discharge        Equipment Recommendations  None recommended by PT    Recommendations for Other Services     Frequency Min 3X/week    Precautions / Restrictions     Pertinent Vitals/Pain Pt noted to be mildly dyspneic during ambulation. 2-3/4      Mobility  Bed Mobility Bed Mobility: Supine to Sit;Sit to Supine Supine to Sit: 7: Independent Sit to Supine: 7: Independent Transfers Transfers: Sit to Stand;Stand to Sit Sit to Stand: 6: Modified independent (Device/Increase time) Stand to Sit: 6: Modified independent (Device/Increase time) Ambulation/Gait Ambulation/Gait Assistance: 5: Supervision Ambulation Distance (Feet): 400 Feet Assistive device: Straight cane Ambulation/Gait Assistance Details: and holding onto IV pole. At times pt kicked cane from out under her. Pt reports not always need ing cane. Gait Pattern: Step-through pattern    Exercises General Exercises - Lower Extremity Hip Flexion/Marching: Strengthening;Both;Standing   PT Diagnosis: Difficulty walking;Generalized weakness  PT Problem List: Decreased strength;Decreased activity tolerance;Decreased mobility;Obesity PT Treatment Interventions: DME instruction;Gait  training;Stair training;Functional mobility training;Therapeutic activities;Therapeutic exercise;Patient/family education     PT Goals(Current goals can be found in the care plan section) Acute Rehab PT Goals Patient Stated Goal: i need to change mhy habits and exercise more. PT Goal Formulation: With patient Time For Goal Achievement: 12/23/12 Potential to Achieve Goals: Good  Visit Information  Last PT Received On: 12/09/12 Assistance Needed: +1 History of Present Illness: Pt admitted 12/08/12 with fatigue, h/o DM. found to have blood glucose > 700 in ED.        Prior Functioning  Home Living Family/patient expects to be discharged to:: Private residence Type of Home: House Home Access: Stairs to enter Technical brewer of Steps: 3 Entrance Stairs-Rails: Left Home Layout: One level [place]: [place] - single point Prior Function Level of Independence: Independent with assistive device(s) Communication Communication: No difficulties    Cognition  Cognition Arousal/Alertness: Awake/alert Behavior During Therapy: WFL for tasks assessed/performed Overall Cognitive Status: Within Functional Limits for tasks assessed    Extremity/Trunk Assessment Upper Extremity Assessment Upper Extremity Assessment: Generalized weakness Lower Extremity Assessment Lower Extremity Assessment: Generalized weakness   Balance    End of Session PT - End of Session Activity Tolerance: Patient limited by fatigue Patient left: in bed;with call bell/phone within reach Nurse Communication: Mobility status  GP     Claretha Cooper 12/09/2012, 2:30 PM Tresa Endo PT [phone]

## 2012-12-09 NOTE — Progress Notes (Signed)
Inpatient Diabetes Program Recommendations  AACE/ADA: New Consensus Statement on Inpatient Glycemic Control (2013)  Target Ranges:  Prepandial:   less than 140 mg/dL      Peak postprandial:   less than 180 mg/dL (1-2 hours)      Critically ill patients:  140 - 180 mg/dL   Reason for Visit: Diabetes Consult for Uncontrolled Diabetes  46y.o. female with a PMH of morbid obesity and insulin requiring DM-2 who presents to the hospital with a 24 hour history of nausea and vomiting and dizziness.  Taking insulin and other DM medications as prescribed. The patient states she has been more fatigued x 1 week and has had 4 days of polyuria and polydipsia. Pt states her meter is broken and she will need a new one.  Has been diagnosed with DM since 1996.  Sees PCP at [facility] and has appt in October. Pt reports no physical activity and many dietary excursions.  States her highest weight has been 550 pounds, therefore has lost approx 130 pounds since. Gives insulin in abdomen and reports no hypoglycemia at home.  Results for [name], [name] (MRN [id number]) as of 12/09/2012 10:32  Ref. Range 12/09/2012 07:50  Sodium Latest Range: 135-145 mEq/L 133 (L)  Potassium Latest Range: 3.5-5.1 mEq/L 2.8 (L)  Chloride Latest Range: 96-112 mEq/L 94 (L)  CO2 Latest Range: 19-32 mEq/L 30  BUN Latest Range: 6-23 mg/dL 16  Creatinine Latest Range: 0.50-1.10 mg/dL 1.92 (H)  Calcium Latest Range: 8.4-10.5 mg/dL 9.4  GFR calc non Af Amer Latest Range: >90 mL/min 31 (L)  GFR calc Af Amer Latest Range: >90 mL/min 36 (L)  Glucose Latest Range: 70-99 mg/dL 183 (H)  Results for [name], [name] (MRN [id number]) as of 12/09/2012 10:32  Ref. Range 12/08/2012 15:00  Hemoglobin A1C Latest Range: <5.7 % 14.9 (H)  Results for [name], [name] (MRN [id number]) as of 12/09/2012 10:32  Ref. Range 12/09/2012 03:01 12/09/2012 04:01 12/09/2012 05:09 12/09/2012 06:03 12/09/2012 07:33  Glucose-Capillary Latest Range: 70-99 mg/dL 158 (H) 150 (H) 152 (H) 155 (H) 188  (H)   Poor compliance with diet and exercise at home.  Needs glucose meter to check blood sugars. Discussed diabetes disease process and treatment options along with role of obesity on insulin resistance. Encouraged weight reduction to improve glucose levels. Discussed role of meds and diet in glucose control along with monitoring. Discussed HgbA1C and importance of reducing this to avoid long and short term complications.  Inpatient Diabetes Program Recommendations Insulin - Basal: Transitioned off GlucoStabilizer and started Lantus 70 units QHS Insulin - Meal Coverage: Add Novolog 4 units tidwc for meal coverage insulin if pt eats >50% meal HgbA1C: 14.9% - uncontrolled at home Outpatient Referral: Refuses OP Diabetes Education at this time.   Diet: CHO mod medium  Will order Living Well With Diabetes book.  Encouraged pt to view diabetes videos on pt education channel. Will need prescription for meter at discharge. Continue to follow as inpatient.  Thank you. Lorenda Peck, RD, LDN, CDE Inpatient Diabetes Coordinator [phone]

## 2012-12-10 DIAGNOSIS — N179 Acute kidney failure, unspecified: Secondary | ICD-10-CM | POA: Diagnosis present

## 2012-12-10 DIAGNOSIS — F17200 Nicotine dependence, unspecified, uncomplicated: Secondary | ICD-10-CM

## 2012-12-10 DIAGNOSIS — E1101 Type 2 diabetes mellitus with hyperosmolarity with coma: Secondary | ICD-10-CM

## 2012-12-10 DIAGNOSIS — N189 Chronic kidney disease, unspecified: Secondary | ICD-10-CM

## 2012-12-10 DIAGNOSIS — E876 Hypokalemia: Secondary | ICD-10-CM

## 2012-12-10 LAB — CBC
HCT: 36.5 % (ref 36.0–46.0)
Hemoglobin: 12.3 g/dL (ref 12.0–15.0)
MCH: 24.8 pg — ABNORMAL LOW (ref 26.0–34.0)
MCV: 73.6 fL — ABNORMAL LOW (ref 78.0–100.0)
RBC: 4.96 MIL/uL (ref 3.87–5.11)
WBC: 5.9 10*3/uL (ref 4.0–10.5)

## 2012-12-10 LAB — GLUCOSE, CAPILLARY
Glucose-Capillary: 301 mg/dL — ABNORMAL HIGH (ref 70–99)
Glucose-Capillary: 303 mg/dL — ABNORMAL HIGH (ref 70–99)
Glucose-Capillary: 341 mg/dL — ABNORMAL HIGH (ref 70–99)
Glucose-Capillary: 369 mg/dL — ABNORMAL HIGH (ref 70–99)
Glucose-Capillary: 381 mg/dL — ABNORMAL HIGH (ref 70–99)

## 2012-12-10 LAB — BASIC METABOLIC PANEL
CO2: 28 mEq/L (ref 19–32)
Calcium: 9.1 mg/dL (ref 8.4–10.5)
Chloride: 96 mEq/L (ref 96–112)
Glucose, Bld: 329 mg/dL — ABNORMAL HIGH (ref 70–99)
Potassium: 3.4 mEq/L — ABNORMAL LOW (ref 3.5–5.1)
Sodium: 131 mEq/L — ABNORMAL LOW (ref 135–145)

## 2012-12-10 MED ORDER — INSULIN ASPART 100 UNIT/ML ~~LOC~~ SOLN
0.0000 [IU] | Freq: Three times a day (TID) | SUBCUTANEOUS | Status: DC
  Administered 2012-12-10 – 2012-12-11 (×2): 11 [IU] via SUBCUTANEOUS
  Administered 2012-12-11: 5 [IU] via SUBCUTANEOUS

## 2012-12-10 MED ORDER — INSULIN GLARGINE 100 UNIT/ML ~~LOC~~ SOLN
80.0000 [IU] | Freq: Every day | SUBCUTANEOUS | Status: DC
  Administered 2012-12-10 – 2012-12-11 (×2): 80 [IU] via SUBCUTANEOUS
  Filled 2012-12-10 (×2): qty 0.8

## 2012-12-10 MED ORDER — POTASSIUM CHLORIDE CRYS ER 20 MEQ PO TBCR
40.0000 meq | EXTENDED_RELEASE_TABLET | Freq: Once | ORAL | Status: AC
  Administered 2012-12-10: 40 meq via ORAL
  Filled 2012-12-10 (×2): qty 2

## 2012-12-10 MED ORDER — INSULIN ASPART 100 UNIT/ML ~~LOC~~ SOLN
4.0000 [IU] | Freq: Three times a day (TID) | SUBCUTANEOUS | Status: DC
  Administered 2012-12-10: 4 [IU] via SUBCUTANEOUS

## 2012-12-10 MED ORDER — INSULIN ASPART 100 UNIT/ML ~~LOC~~ SOLN
0.0000 [IU] | Freq: Every day | SUBCUTANEOUS | Status: DC
  Administered 2012-12-10: 4 [IU] via SUBCUTANEOUS

## 2012-12-10 MED ORDER — INSULIN ASPART 100 UNIT/ML ~~LOC~~ SOLN
5.0000 [IU] | Freq: Once | SUBCUTANEOUS | Status: AC
  Administered 2012-12-10: 5 [IU] via SUBCUTANEOUS

## 2012-12-10 MED ORDER — INSULIN ASPART 100 UNIT/ML ~~LOC~~ SOLN
6.0000 [IU] | Freq: Three times a day (TID) | SUBCUTANEOUS | Status: DC
  Administered 2012-12-10 – 2012-12-11 (×3): 6 [IU] via SUBCUTANEOUS

## 2012-12-10 NOTE — Progress Notes (Addendum)
[facility] PROGRESS NOTE  Makayla [name] [id number] DOB: June 11, 1965 DOA: 12/08/2012 PCP: Antonietta Jewel, MD  Brief narrative 47 year old female with history of morbid obesity, insulin requiring DM 2, HTN, stage III chronic kidney disease, hyperlipidemia, noncompliant with diabetic diet and CBG checks at home, claims compliance with diabetic medication regimen, presented to the ED on 12/08/12 with complaints of nausea, vomiting, dizziness, fatigue, polyuria and polydipsia. In the ED, blood glucose 780 mg/dL and patient was in hyperosmolar nonketotic state. She was treated appropriately with aggressive IV fluids, insulin drip and once her blood sugars improved, she was transitioned to Lantus and SSI. Her blood sugars continue to be elevated in the mid 300s.  Assessment/Plan: HONK -Resolved. -Now on lantus/ SSI. -Continue to titrate according to CBGs.Marland Kitchen  Poorly controlled type II DM - Last received Lantus at 4 AM on 12/09/12. CBGs continue to trend in the 300 mg per DL range. - Hemoglobin A1c >14 - Patient verbalizes noncompliance with diet and blood sugar checks. Counseled extensively. - Increase Lantus to 80 units daily, add NovoLog mealtime coverage and continue resistant SSI. Monitor closely. Patient declines outpatient DM education.  Hypokalemia - Replete and follow BMP   Tobacco abuse  - cessation counseled.   Chronic bronchitis  -Continue bronchodilator therapy as needed.   HYPERLIPIDEMIA  -Continue Lovaza and statin therapy.   HYPERTENSION  -Continue Norvasc.   Hypokalemia  -Continue to replete. -Check Mag level.  ARF (acute renal failure) / stage III chronic kidney disease  -Continue calcitriol.  -Acute renal failure secondary to osmotic diuresis from significant hyperglycemia and dehydration.  - Baseline creatinine 1.2-1.3 or higher ? 1.5-1.6 - Creatinine continues to decrease. Follow BMP in a.m & close OP follow up.  Dehydration  -Aggressive fluids as noted above.    Obesity, morbid  -Dietitian consultation requested.   Neuropathy  -Continue Neurontin.   Pseudohyponatremia - Secondary to hyperglycemia.     Code Status: Full Code Family Communication: Patient only  Disposition Plan: Home when medically stable, hopefully in 24 hours.   Consultants:   Diabetes coordinator   Antibiotics:  None   Subjective:  Occasional dizziness but much improved compared to admission. Denies any other complaints.   Objective: Filed Vitals:   12/09/12 2140 12/10/12 0617 12/10/12 1005 12/10/12 1400  BP: 99/68 119/71 109/68 128/74  Pulse: 77 67 75 75  Temp: 97.8 F (36.6 C) 97.9 F (36.6 C)  97.6 F (36.4 C)  TempSrc: Oral Oral  Oral  Resp: 18 18  18   Height:      Weight:      SpO2: 98% 98%  100%    Intake/Output Summary (Last 24 hours) at 12/10/12 1515 Last data filed at 12/10/12 1400  Gross per 24 hour  Intake   2040 ml  Output   1550 ml  Net    490 ml   Filed Weights   12/08/12 1532 12/09/12 0355  Weight: 224.531 kg (495 lb) 188 kg (414 lb 7.4 oz)    Exam:   General:   moderately built and morbidly obese female patient, sitting up in bed in no obvious distress.   Cardiovascular: S1 and S2 heard, RRR. No JVD, murmurs or pedal edema.   Respiratory: CLEAR TO AUSCULTATION. NO INCREASED WORK OF BREATHING.   Abdomen: obese/S/NT/ND/+BS  Extremities: no C/C/E/+pulses   Neurologic:  Grossly intact and non-focal. Alert and oriented   Data Reviewed: Basic Metabolic Panel:  Recent Labs Lab 12/09/12 0750 12/09/12 1105 12/09/12 1430 12/09/12 1830 12/09/12 2050 12/10/12 0405  NA 133* 130* 127*  --  126* 131*  K 2.8* 3.0* 2.9*  --  3.1* 3.4*  CL 94* 91* 88*  --  88* 96  CO2 30 29 28   --  28 28  GLUCOSE 183* 332* 510*  --  522* 329*  BUN 16 16 17   --  18 16  CREATININE 1.92* 2.00* 2.08*  --  2.02* 1.83*  CALCIUM 9.4 9.2 9.3  --  9.1 9.1  MG  --   --   --  1.9  --   --    Liver Function Tests:  Recent Labs Lab  12/08/12 1300  AST 16  ALT 11  ALKPHOS 94  BILITOT 0.9  PROT 8.1  ALBUMIN 3.9   No results found for this basename: LIPASE, AMYLASE,  in the last 168 hours No results found for this basename: AMMONIA,  in the last 168 hours CBC:  Recent Labs Lab 12/08/12 1300 12/10/12 0405  WBC 4.7 5.9  NEUTROABS 2.1  --   HGB 13.7 12.3  HCT 38.9 36.5  MCV 71.9* 73.6*  PLT 151 152   Cardiac Enzymes: No results found for this basename: CKTOTAL, CKMB, CKMBINDEX, TROPONINI,  in the last 168 hours BNP (last 3 results) No results found for this basename: PROBNP,  in the last 8760 hours CBG:  Recent Labs Lab 12/09/12 1848 12/09/12 2136 12/10/12 0057 12/10/12 0709 12/10/12 1123  GLUCAP 461* 502* 381* 303* 369*    No results found for this or any previous visit (from the past 240 hour(s)).   Studies: Dg Chest Port 1 View  12/08/2012   *RADIOLOGY REPORT*  Clinical Data: Cough, shortness of breath  PORTABLE CHEST - 1 VIEW  Comparison: 05/16/2006  Findings: Limited exam because of portable technique and obese body habitus.  Mild cardiac enlargement with vascular and interstitial prominence.  Mild edema not excluded.  No focal pneumonia, collapse or consolidation.  No effusion or pneumothorax.  Trachea midline.  IMPRESSION: Cardiomegaly with vascular congestion versus early edema   Original Report Authenticated By: Jerilynn Mages. Shick, M.D.    Scheduled Meds: . amLODipine  5 mg Oral Daily  . atorvastatin  80 mg Oral q1800  . calcitRIOL  0.25 mcg Oral 3 times weekly  . enoxaparin (LOVENOX) injection  80 mg Subcutaneous Q24H  . gabapentin  100 mg Oral BID  . insulin aspart  0-20 Units Subcutaneous TID WC  . insulin aspart  4 Units Subcutaneous TID WC  . insulin glargine  70 Units Subcutaneous QHS  . omega-3 acid ethyl esters  1 g Oral BID   Continuous Infusions:   Principal Problem:   Hyperosmolar non-ketotic state in patient with type 2 diabetes mellitus Active Problems:   HYPERLIPIDEMIA    HYPERTENSION   Hypokalemia   ARF (acute renal failure)   Dehydration   Obesity, morbid   Tobacco abuse    Time spent: 35 minutes    [facility]  [facility] Pager [phone]  If 7PM-7AM, please contact night-coverage at [email or web address], password [facility] 12/10/2012, 3:15 PM  LOS: 2 days

## 2012-12-10 NOTE — Progress Notes (Signed)
Inpatient Diabetes Program Recommendations  AACE/ADA: New Consensus Statement on Inpatient Glycemic Control (2013)  Target Ranges:  Prepandial:   less than 140 mg/dL      Peak postprandial:   less than 180 mg/dL (1-2 hours)      Critically ill patients:  140 - 180 mg/dL   Reason for Visit: Hyperglycemia  Results for [name], Makayla [name] (MRN [id number]) as of 12/10/2012 09:35  Ref. Range 12/09/2012 12:30 12/09/2012 16:50 12/09/2012 18:48 12/09/2012 21:36 12/10/2012 00:57 12/10/2012 07:09  Glucose-Capillary Latest Range: 70-99 mg/dL 387 (H) 535 (H) 461 (H) 502 (H) 381 (H) 303 (H)  Results for Makayla [name], Makayla [name] (MRN [id number]) as of 12/10/2012 09:35  Ref. Range 12/09/2012 20:50 12/10/2012 04:05  Sodium Latest Range: 135-145 mEq/L 126 (L) 131 (L)  Potassium Latest Range: 3.5-5.1 mEq/L 3.1 (L) 3.4 (L)  Chloride Latest Range: 96-112 mEq/L 88 (L) 96  CO2 Latest Range: 19-32 mEq/L 28 28  BUN Latest Range: 6-23 mg/dL 18 16  Creatinine Latest Range: 0.50-1.10 mg/dL 2.02 (H) 1.83 (H)  Calcium Latest Range: 8.4-10.5 mg/dL 9.1 9.1  GFR calc non Af Amer Latest Range: >90 mL/min 29 (L) 33 (L)  GFR calc Af Amer Latest Range: >90 mL/min 33 (L) 38 (L)  Glucose Latest Range: 70-99 mg/dL 522 (H) 329 (H)  Results for Makayla [name], Makayla [name] (MRN [id number]) as of 12/10/2012 09:35  Ref. Range 12/09/2012 11:05 12/09/2012 14:30 12/09/2012 20:50 12/10/2012 04:05  Glucose Latest Range: 70-99 mg/dL 332 (H) 510 (H) 522 (H) 329 (H)   Uncontrolled blood sugars after discontinuation of insulin drip.    Pt received Lantus 70 units at 0400 on 9/2 when she was transitioned off [place]. Did not receive Lantus 70 units QHS last night.  Will need Lantus order to be changed to QAM since pt started her Lantus in the am. Needs Lantus dose this am! May benefit by increasing dose to 80 units QAM.  Please add meal coverage insulin since pt is eating 100% meals. Recommend Novolog 10 units tidwc. Please add HS coverage to Novolog correction. Will continue with inpatient  diabetes education.  Thank you. Lorenda Peck, RD, LDN, CDE Inpatient Diabetes Coordinator [phone]

## 2012-12-10 NOTE — Progress Notes (Signed)
Saline lock right hand discontinued due to occlusion

## 2012-12-11 DIAGNOSIS — I10 Essential (primary) hypertension: Secondary | ICD-10-CM

## 2012-12-11 LAB — GLUCOSE, CAPILLARY
Glucose-Capillary: 223 mg/dL — ABNORMAL HIGH (ref 70–99)
Glucose-Capillary: 377 mg/dL — ABNORMAL HIGH (ref 70–99)

## 2012-12-11 LAB — BASIC METABOLIC PANEL
CO2: 26 mEq/L (ref 19–32)
Chloride: 99 mEq/L (ref 96–112)
GFR calc Af Amer: 44 mL/min — ABNORMAL LOW (ref >90)
Potassium: 3.6 mEq/L (ref 3.5–5.1)
Sodium: 135 mEq/L (ref 135–145)

## 2012-12-11 MED ORDER — INSULIN GLARGINE 100 UNIT/ML ~~LOC~~ SOLN: 80.0000 [IU] | Freq: Every day | SUBCUTANEOUS | Status: DC

## 2012-12-11 MED ORDER — "INSULIN SYRINGE-NEEDLE U-100 29G X 1/2"" 0.3 ML MISC": Status: AC

## 2012-12-11 MED ORDER — INSULIN ASPART 100 UNIT/ML ~~LOC~~ SOLN: 6.0000 [IU] | Freq: Three times a day (TID) | SUBCUTANEOUS | Status: DC

## 2012-12-11 MED ORDER — UNABLE TO FIND: Status: DC

## 2012-12-11 NOTE — ED Provider Notes (Signed)
Medical screening examination/treatment/procedure(s) were conducted as a shared visit with non-physician practitioner(s) and myself.  I personally evaluated the patient during the encounter  I saw and evaluated this patient. I discussed with her her lab  findings and treatment plan with her including plans for admission. She is awake alert and mentating well not tachypnea or suggestive of acidosis.  Lolita Patella, MD 12/11/12 [phone]

## 2012-12-11 NOTE — Progress Notes (Signed)
Assessment unchanged. Pt verbalized understanding of dc instructions through teach back. Insulin reviewed and discussed with pt-- pt able to tell nurse " I need to take 6 Units of short actin insulin with my 3 meals, and my Lantus at night." Pt accustomed to taking Lantus at hs therefore instructed to start tomorrow night with verbalized understanding. Pt to pick up supplies and insulin as ordered at local pharmacy. Aware of follow up appointment with PCP and encouraged to keep. Script x 1 for diabetic supplies given as provided by MD. Pt has diabetic teaching materials as presented during this visit from Diabetes Management Coordinator. My Chart introduced with verbalized understanding. Discharged via wc to front entrance to meet awaiting vehicle to carry home. Accompanied by RN.

## 2012-12-11 NOTE — Progress Notes (Signed)
9/4  Spoke with Dr. Algis Liming.  Recommend that patient be discharged on Lantus 80 units daily, Novolog meal coverage 6 units TID,and continue Glipizide 10 mg BID.   Needs prescription for blood glucose meter, strips, and lancets.  Has appointment at the [facility] clinic on October 1.  Will have staff RNs review DM survival skills with her.  Harvel Ricks RN BSN CDE

## 2012-12-11 NOTE — Care Management Note (Signed)
    Page 1 of 2   12/11/2012     12:37:09 PM   CARE MANAGEMENT NOTE 12/11/2012  Patient:  Burggraf,Daliyah   Account Number:  [id number]  Date Initiated:  12/08/2012  Documentation initiated by:  Dobkins,RHONDA  Subjective/Objective Assessment:   pt with hxof dm, admitted with bs greater than 500, na 120, n and vomiting     Action/Plan:   home when stable   Anticipated DC Date:  12/11/2012   Anticipated DC Plan:  HOME/SELF CARE  In-house referral  NA      DC Planning Services  CM consult      PAC Choice  NA   Choice offered to / List presented to:  NA   DME arranged  NA      DME agency  NA     [place] arranged  NA      [place] agency  NA   Status of service:  Completed, signed off Medicare Important Message given?  NA - LOS <3 / Initial given by admissions (If response is "NO", the following Medicare IM given date fields will be blank) Date Medicare IM given:   Date Additional Medicare IM given:    Discharge Disposition:  HOME/SELF CARE  Per UR Regulation:  Reviewed for med. necessity/level of care/duration of stay  If discussed at [place] of Stay Meetings, dates discussed:    Comments:  [id number]/Rhonda Avedisian,RN,BSN,CCM [id number]

## 2012-12-11 NOTE — Discharge Summary (Signed)
Physician Discharge Summary  Makayla [name] [id number] DOB: 1965/06/01 DOA: 12/08/2012  PCP: Vonna Drafts., FNP  Admit date: 12/08/2012 Discharge date: 12/11/2012  Time spent: Greater than 30 minutes  Recommendations for Outpatient Follow-up:  1. Dr. Kathlene November, PCP on 12/17/12 at 9:30 AM with repeat labs (BMP) 2. Consider outpatient evaluation of microscopic hematuria.  Discharge Diagnoses:  Principal Problem:   Hyperosmolar non-ketotic state in patient with type 2 diabetes mellitus Active Problems:   HYPERLIPIDEMIA   HYPERTENSION   Hypokalemia   Dehydration   Obesity, morbid   Tobacco abuse   Renal failure, acute on chronic   Discharge Condition: Improved & Stable  Diet recommendation: Heart Healthy & Diabetic diet.  Filed Weights   12/08/12 1532 12/09/12 0355  Weight: 224.531 kg (495 lb) 188 kg (414 lb 7.4 oz)    History of present illness:  47 year old female with history of morbid obesity, insulin requiring DM 2, HTN, stage III chronic kidney disease, hyperlipidemia, noncompliant with diabetic diet and CBG checks at home, claims compliance with diabetic medication regimen, presented to the ED on 12/08/12 with complaints of nausea, vomiting, dizziness, fatigue, polyuria and polydipsia. In the ED, blood glucose 780 mg/dL and patient was in hyperosmolar nonketotic state.  Hospital Course:   HONK  - She was treated with aggressive IV fluid hydration and insulin drip. - Resolved.  - Now on lantus/ SSI. These have to be titrated outpatient as deemed necessary.  Poorly controlled type II DM  - Hemoglobin A1c >14  - Patient verbalizes noncompliance with diet and blood sugar checks. She claims compliance to her Lantus and oral medications. Counseled extensively.  - Increased Lantus to 80 units daily, added NovoLog mealtime coverage 6 units and continue glipizide on discharge.  - Will not discharge on SSI so as to keep the regimen simple and hence patient  compliance. - CBGs are still inadequately controlled and hence will need outpatient titration of medications.  Hypokalemia  - Repleted  Tobacco abuse  - cessation counseled.   Chronic bronchitis  -Continue bronchodilator therapy as needed.  - Stable.  HYPERLIPIDEMIA  -Continue Lovaza and statin therapy.   HYPERTENSION  -Continue Norvasc & HCTZ.   ARF (acute renal failure) / stage III chronic kidney disease  -Continue calcitriol.  -Acute renal failure secondary to osmotic diuresis from significant hyperglycemia and dehydration.  - Baseline creatinine 1.2-1.3 or higher ? 1.5-1.6  - Creatinine continues to decrease. Follow BMP in a week's time as OP.   Dehydration  -Resolved after IV fluids.  Obesity, morbid  -Dietitian consultation requested.  - Eventually will need diet & weight loss. - Consider OP evaluation for OSA  Neuropathy  -Continue Neurontin.   Pseudohyponatremia  - Secondary to hyperglycemia. Resolved.   Consultations:  Diabetes coordinator  Procedures:  None    Discharge Exam:  Complaints:  Denies complaints. Anxious to go home.  Filed Vitals:   12/10/12 1400 12/10/12 2107 12/11/12 0555 12/11/12 1032  BP: 128/74 109/77 130/57 102/69  Pulse: 75 68 72   Temp: 97.6 F (36.4 C) 98.2 F (36.8 C) 98.2 F (36.8 C)   TempSrc: Oral Oral Oral   Resp: 18 18 16    Height:      Weight:      SpO2: 100% 99% 97%     General: moderately built and morbidly obese female patient, sitting up in bed in no obvious distress.  Cardiovascular: S1 and S2 heard, RRR. No JVD, murmurs or pedal edema.  Respiratory: Clear to auscultation.  No increased work of breathing  Abdomen: obese/S/NT/ND/+BS  Extremities: no C/C/E/+pulses  Neurologic: Grossly intact and non-focal. Alert and oriented    Discharge Instructions      Discharge Orders   Future Orders Complete By Expires   Call MD for:  extreme fatigue  As directed    Call MD for:  persistant dizziness or  light-headedness  As directed    Call MD for:  persistant nausea and vomiting  As directed    Diet - low sodium heart healthy  As directed    Diet Carb Modified  As directed    Increase activity slowly  As directed        Medication List         albuterol 108 (90 BASE) MCG/ACT inhaler  Commonly known as:  PROVENTIL HFA;VENTOLIN HFA  Inhale 2 puffs into the lungs every 6 (six) hours as needed. For shortness of breath     amLODipine 5 MG tablet  Commonly known as:  NORVASC  Take 5 mg by mouth daily.     calcitRIOL 0.25 MCG capsule  Commonly known as:  ROCALTROL  Take 0.25 mcg by mouth 3 (three) times a week. Monday, Wednesday, and Fridays     gabapentin 100 MG capsule  Commonly known as:  NEURONTIN  Take 100 mg by mouth 2 (two) times daily.     glipiZIDE 10 MG tablet  Commonly known as:  GLUCOTROL  Take 10 mg by mouth 2 (two) times daily.     hydrochlorothiazide 25 MG tablet  Commonly known as:  HYDRODIURIL  Take 25 mg by mouth daily.     insulin aspart 100 UNIT/ML injection  Commonly known as:  novoLOG  Inject 6 Units into the skin 3 (three) times daily with meals.     insulin glargine 100 UNIT/ML injection  Commonly known as:  LANTUS  Inject 0.8 mLs (80 Units total) into the skin daily.     Insulin Syringe-Needle U-100 29G X 1/2" 0.3 ML Misc  Commonly known as:  SAFETY-GLIDE 0.3CC SYR 29GX1/2  Use as directed.     omega-3 acid ethyl esters 1 G capsule  Commonly known as:  LOVAZA  Take 1 g by mouth 2 (two) times daily.     rosuvastatin 40 MG tablet  Commonly known as:  CRESTOR  Take 40 mg by mouth daily.     UNABLE TO FIND  - 1. Glucometer x 1.  - 2. Lancets x 1 box.  - 3. Glucometer strips x 1 box.          The results of significant diagnostics from this hospitalization (including imaging, microbiology, ancillary and laboratory) are listed below for reference.    Significant Diagnostic Studies: Dg Chest Port 1 View  12/08/2012   *RADIOLOGY  REPORT*  Clinical Data: Cough, shortness of breath  PORTABLE CHEST - 1 VIEW  Comparison: 05/16/2006  Findings: Limited exam because of portable technique and obese body habitus.  Mild cardiac enlargement with vascular and interstitial prominence.  Mild edema not excluded.  No focal pneumonia, collapse or consolidation.  No effusion or pneumothorax.  Trachea midline.  IMPRESSION: Cardiomegaly with vascular congestion versus early edema   Original Report Authenticated By: Jerilynn Mages. Annamaria Boots, M.D.    Microbiology: No results found for this or any previous visit (from the past 240 hour(s)).   Labs: Basic Metabolic Panel:  Recent Labs Lab 12/09/12 1105 12/09/12 1430 12/09/12 1830 12/09/12 2050 12/10/12 0405 12/11/12 0410  NA 130* 127*  --  126* 131* 135  K 3.0* 2.9*  --  3.1* 3.4* 3.6  CL 91* 88*  --  88* 96 99  CO2 29 28  --  28 28 26   GLUCOSE 332* 510*  --  522* 329* 210*  BUN 16 17  --  18 16 13   CREATININE 2.00* 2.08*  --  2.02* 1.83* 1.61*  CALCIUM 9.2 9.3  --  9.1 9.1 9.3  MG  --   --  1.9  --   --   --    Liver Function Tests:  Recent Labs Lab 12/08/12 1300  AST 16  ALT 11  ALKPHOS 94  BILITOT 0.9  PROT 8.1  ALBUMIN 3.9   No results found for this basename: LIPASE, AMYLASE,  in the last 168 hours No results found for this basename: AMMONIA,  in the last 168 hours CBC:  Recent Labs Lab 12/08/12 1300 12/10/12 0405  WBC 4.7 5.9  NEUTROABS 2.1  --   HGB 13.7 12.3  HCT 38.9 36.5  MCV 71.9* 73.6*  PLT 151 152   Cardiac Enzymes: No results found for this basename: CKTOTAL, CKMB, CKMBINDEX, TROPONINI,  in the last 168 hours BNP: BNP (last 3 results) No results found for this basename: PROBNP,  in the last 8760 hours CBG:  Recent Labs Lab 12/10/12 1123 12/10/12 1624 12/10/12 2125 12/11/12 0714 12/11/12 1127  GLUCAP 369* 301* 341* 223* 377*    Additional labs: 1. Admitting BMP: Sodium 120, potassium 3.1, chloride 79, bicarbonate 29, BUN 20, creatinine 2.23 and  glucose 780. 2. Hemoglobin A1c 12/08/12:14.9 3. TSH: 1.868   Signed:  Alexianna Nachreiner  [facility] 12/11/2012, 2:13 PM

## 2013-10-10 ENCOUNTER — Emergency Department (INDEPENDENT_AMBULATORY_CARE_PROVIDER_SITE_OTHER)
Admission: EM | Admit: 2013-10-10 | Discharge: 2013-10-10 | Disposition: A | Discharge status: Home / Self Care | Payer: Medicare Other | Source: Home / Self Care | Attending: Family Medicine | Admitting: Family Medicine

## 2013-10-10 ENCOUNTER — Encounter (HOSPITAL_COMMUNITY): Payer: Self-pay | Admitting: Emergency Medicine

## 2013-10-10 DIAGNOSIS — L03811 Cellulitis of head [any part, except face]: Secondary | ICD-10-CM

## 2013-10-10 DIAGNOSIS — L02818 Cutaneous abscess of other sites: Secondary | ICD-10-CM

## 2013-10-10 DIAGNOSIS — L03818 Cellulitis of other sites: Secondary | ICD-10-CM

## 2013-10-10 MED ORDER — MINOCYCLINE HCL 100 MG PO CAPS: 100.0000 mg | ORAL_CAPSULE | Freq: Two times a day (BID) | ORAL | Status: DC

## 2013-10-10 MED ORDER — CEPHALEXIN 500 MG PO CAPS: 500.0000 mg | ORAL_CAPSULE | Freq: Four times a day (QID) | ORAL | Status: DC

## 2013-10-10 NOTE — ED Notes (Signed)
Pt c/o abscess behind left ear/head onset 2 days Getting worse; drainage, tenderness, swelling  Denies fevers Alert w/no signs of acute distress.

## 2013-10-10 NOTE — ED Provider Notes (Signed)
CSN: [id number]     Arrival date & time 10/10/13  1054 History   First MD Initiated Contact with Patient 10/10/13 1148     Chief Complaint  Patient presents with  . Abscess   (Consider location/radiation/quality/duration/timing/severity/associated sxs/prior Treatment) Patient is a 48 y.o. female presenting with abscess. The history is provided by the patient.  Abscess Location:  Head/neck Head/neck abscess location:  L neck and scalp Abscess quality: draining, fluctuance, painful and redness   Red streaking: no   Duration:  3 days Progression:  Worsening Pain details:    Quality:  Sharp and throbbing   Severity:  Moderate Chronicity:  New Context: diabetes   Context comment:  Had hair braids in and feels these may have contributed. Relieved by:  Warm water soaks and draining/squeezing Associated symptoms: no fever   Risk factors: prior abscess     Past Medical History  Diagnosis Date  . Diabetes mellitus   . Hypertension   . Cellulitis   . Hyperlipidemia   . CKD (chronic kidney disease), stage III   . Neuropathy   . Chronic bronchitis    Past Surgical History  Procedure Laterality Date  . Tonsillectomy     Family History  Problem Relation Age of Onset  . Diabetes Mother   . Diabetes Father   . Diabetes Sister   . Hypertension Mother   . Hyperlipidemia Mother   . Heart disease Father    History  Substance Use Topics  . Smoking status: Current Every Day Smoker -- 1.00 packs/day for 23 years    Types: Cigarettes  . Smokeless tobacco: Not on file  . Alcohol Use: Yes     Comment: Occasional use: 4 drinks per occasion, once a month   OB History   Grav Para Term Preterm Abortions TAB SAB Ect Mult Living                 Review of Systems  Constitutional: Negative.  Negative for fever and chills.  Skin: Positive for rash and wound.    Allergies  Strawberry  Home Medications   Prior to Admission medications   Medication Sig Start Date End Date Taking?  Authorizing Provider  amLODipine (NORVASC) 5 MG tablet Take 5 mg by mouth daily.   Yes Historical Provider, MD  gabapentin (NEURONTIN) 100 MG capsule Take 100 mg by mouth 2 (two) times daily.   Yes Historical Provider, MD  glipiZIDE (GLUCOTROL) 10 MG tablet Take 10 mg by mouth 2 (two) times daily.     Yes Historical Provider, MD  hydrochlorothiazide (HYDRODIURIL) 25 MG tablet Take 25 mg by mouth daily.   Yes Historical Provider, MD  insulin aspart (NOVOLOG) 100 UNIT/ML injection Inject 6 Units into the skin 3 (three) times daily with meals. 12/11/12  Yes Modena Jansky, MD  insulin glargine (LANTUS) 100 UNIT/ML injection Inject 0.8 mLs (80 Units total) into the skin daily. 12/11/12  Yes Modena Jansky, MD  rosuvastatin (CRESTOR) 40 MG tablet Take 40 mg by mouth daily.    Yes Historical Provider, MD  albuterol (PROVENTIL HFA;VENTOLIN HFA) 108 (90 BASE) MCG/ACT inhaler Inhale 2 puffs into the lungs every 6 (six) hours as needed. For shortness of breath     Historical Provider, MD  calcitRIOL (ROCALTROL) 0.25 MCG capsule Take 0.25 mcg by mouth 3 (three) times a week. Monday, Wednesday, and Fridays    Historical Provider, MD  cephALEXin (KEFLEX) 500 MG capsule Take 1 capsule (500 mg total) by mouth 4 (four)  times daily. Take all of medicine and drink lots of fluids 10/10/13   Billy Fischer, MD  Insulin Syringe-Needle U-100 (SAFETY-GLIDE 0.3CC SYR 29GX1/2) 29G X 1/2" 0.3 ML MISC Use as directed. 12/11/12   Modena Jansky, MD  minocycline (MINOCIN,DYNACIN) 100 MG capsule Take 1 capsule (100 mg total) by mouth 2 (two) times daily. 10/10/13   Billy Fischer, MD  omega-3 acid ethyl esters (LOVAZA) 1 G capsule Take 1 g by mouth 2 (two) times daily.    Historical Provider, MD  UNABLE TO FIND 1. Glucometer x 1. 2. Lancets x 1 box. 3. Glucometer strips x 1 box. 12/11/12   Modena Jansky, MD   BP 113/68  Pulse 78  Temp(Src) 98.9 F (37.2 C) (Oral)  Resp 16  SpO2 100%  LMP 12/08/2012 Physical Exam  Nursing  note and vitals reviewed. Constitutional: She is oriented to person, place, and time. She appears well-developed and well-nourished. She appears distressed.  Neurological: She is alert and oriented to person, place, and time.  Skin: Skin is warm and dry. There is erythema.  Purulent draining left scalp fluctuant lesion with assoc cellulitis and adenitis. Fluid expressed, cultured.    ED Course  Procedures (including critical care time) Labs Review Labs Reviewed  CULTURE, ROUTINE-ABSCESS    Imaging Review No results found.   MDM   1. Abscess or cellulitis of scalp        Billy Fischer, MD 10/10/13 1206

## 2013-10-10 NOTE — Discharge Instructions (Signed)
Warm compress twice a day when you take the antibiotic, take all of medicine, return as needed. °

## 2013-10-13 ENCOUNTER — Telehealth (HOSPITAL_COMMUNITY): Payer: Self-pay | Admitting: *Deleted

## 2013-10-13 LAB — CULTURE, ROUTINE-ABSCESS

## 2013-10-13 NOTE — ED Notes (Signed)
Abscess culture L post. scalp: Mod. MRSA.  Pt. adequately treated with the Minocycline and also got Keflex.  I called pt. Pt. verified x 2 and given result.  Pt. Instructed to finish all of her antibiotics.  I reviewed the [facility] MRSA instructions with her. She asked how she got it.  I told her I did not know. Makayla [name] 10/13/2013

## 2013-12-22 ENCOUNTER — Other Ambulatory Visit: Payer: Self-pay | Admitting: Nurse Practitioner

## 2013-12-22 DIAGNOSIS — Z1231 Encounter for screening mammogram for malignant neoplasm of breast: Secondary | ICD-10-CM

## 2014-01-05 ENCOUNTER — Ambulatory Visit
Admission: RE | Admit: 2014-01-05 | Discharge: 2014-01-05 | Disposition: A | Discharge status: Home / Self Care | Payer: Medicare Other | Source: Ambulatory Visit | Attending: Nurse Practitioner | Admitting: Nurse Practitioner

## 2014-01-05 DIAGNOSIS — Z1231 Encounter for screening mammogram for malignant neoplasm of breast: Secondary | ICD-10-CM

## 2014-07-06 ENCOUNTER — Encounter (HOSPITAL_COMMUNITY): Payer: Self-pay | Admitting: Emergency Medicine

## 2014-07-06 ENCOUNTER — Emergency Department (INDEPENDENT_AMBULATORY_CARE_PROVIDER_SITE_OTHER)
Admission: EM | Admit: 2014-07-06 | Discharge: 2014-07-06 | Disposition: A | Discharge status: Home / Self Care | Payer: Medicare Other | Source: Home / Self Care | Attending: Family Medicine | Admitting: Family Medicine

## 2014-07-06 DIAGNOSIS — IMO0002 Reserved for concepts with insufficient information to code with codable children: Secondary | ICD-10-CM

## 2014-07-06 DIAGNOSIS — I10 Essential (primary) hypertension: Secondary | ICD-10-CM | POA: Diagnosis not present

## 2014-07-06 DIAGNOSIS — E1165 Type 2 diabetes mellitus with hyperglycemia: Secondary | ICD-10-CM | POA: Diagnosis not present

## 2014-07-06 LAB — POCT I-STAT, CHEM 8
BUN: 29 mg/dL — AB (ref 6–23)
CHLORIDE: 96 mmol/L (ref 96–112)
Calcium, Ion: 1.25 mmol/L — ABNORMAL HIGH (ref 1.12–1.23)
Creatinine, Ser: 1.4 mg/dL — ABNORMAL HIGH (ref 0.50–1.10)
Glucose, Bld: 517 mg/dL — ABNORMAL HIGH (ref 70–99)
HCT: 51 % — ABNORMAL HIGH (ref 36.0–46.0)
HEMOGLOBIN: 17.3 g/dL — AB (ref 12.0–15.0)
Potassium: 4.9 mmol/L (ref 3.5–5.1)
SODIUM: 132 mmol/L — AB (ref 135–145)
TCO2: 25 mmol/L (ref 0–100)

## 2014-07-06 NOTE — ED Provider Notes (Signed)
CSN: [id number]     Arrival date & time 07/06/14  1302 History   First MD Initiated Contact with Patient 07/06/14 1357     Chief Complaint  Patient presents with  . Hyperglycemia   (Consider location/radiation/quality/duration/timing/severity/associated sxs/prior Treatment) HPI  Came in today due to high sugar. Home glucose meter showed 591 2 hours ago. Typically does not check sugar. Did not check glucose uesterday. Associated w/ HA which is typical for pt for high sugar. Low sugar causes pt to be thirsty. Changed from Lantus to ???Levemir??? in February due to lantus not working. Pt endorses taking 100 units levemir nightly and 10 units levemir TIDAC. Denies fever, nausea, vomiting, abdominal pain, dysuria, frequency, back pain, syncope, palpitations, chest pain, shortness of breath, diarrhea.  She continues to smoke though only one to 3 cigarettes per day. Trying to quit.   Past Medical History  Diagnosis Date  . Diabetes mellitus   . Hypertension   . Cellulitis   . Hyperlipidemia   . CKD (chronic kidney disease), stage III   . Neuropathy   . Chronic bronchitis    Past Surgical History  Procedure Laterality Date  . Tonsillectomy     Family History  Problem Relation Age of Onset  . Diabetes Mother   . Diabetes Father   . Diabetes Sister   . Hypertension Mother   . Hyperlipidemia Mother   . Heart disease Father    History  Substance Use Topics  . Smoking status: Current Every Day Smoker -- 0.10 packs/day for 23 years    Types: Cigarettes  . Smokeless tobacco: Not on file  . Alcohol Use: Yes     Comment: Occasional use: 4 drinks per occasion, once a month   OB History    No data available     Review of Systems Per HPI with all other pertinent systems negative.   Allergies  Strawberry  Home Medications   Prior to Admission medications   Medication Sig Start Date End Date Taking? Authorizing Provider  albuterol (PROVENTIL HFA;VENTOLIN HFA) 108 (90 BASE)  MCG/ACT inhaler Inhale 2 puffs into the lungs every 6 (six) hours as needed. For shortness of breath     Historical Provider, MD  amLODipine (NORVASC) 5 MG tablet Take 5 mg by mouth daily.    Historical Provider, MD  calcitRIOL (ROCALTROL) 0.25 MCG capsule Take 0.25 mcg by mouth 3 (three) times a week. Monday, Wednesday, and Fridays    Historical Provider, MD  gabapentin (NEURONTIN) 100 MG capsule Take 100 mg by mouth 2 (two) times daily.    Historical Provider, MD  glipiZIDE (GLUCOTROL) 10 MG tablet Take 10 mg by mouth 2 (two) times daily.      Historical Provider, MD  hydrochlorothiazide (HYDRODIURIL) 25 MG tablet Take 25 mg by mouth daily.    Historical Provider, MD  insulin aspart (NOVOLOG) 100 UNIT/ML injection Inject 6 Units into the skin 3 (three) times daily with meals. 12/11/12   Modena Jansky, MD  Insulin Syringe-Needle U-100 (SAFETY-GLIDE 0.3CC SYR 29GX1/2) 29G X 1/2" 0.3 ML MISC Use as directed. 12/11/12   Modena Jansky, MD  minocycline (MINOCIN,DYNACIN) 100 MG capsule Take 1 capsule (100 mg total) by mouth 2 (two) times daily. Patient not taking: Reported on 07/06/2014 10/10/13   Billy Fischer, MD  omega-3 acid ethyl esters (LOVAZA) 1 G capsule Take 1 g by mouth 2 (two) times daily.    Historical Provider, MD  rosuvastatin (CRESTOR) 40 MG tablet Take 40 mg  by mouth daily.     Historical Provider, MD  UNABLE TO FIND 1. Glucometer x 1. 2. Lancets x 1 box. 3. Glucometer strips x 1 box. 12/11/12   Modena Jansky, MD   BP 152/81 mmHg  Pulse 79  Temp(Src) 98.7 F (37.1 C) (Oral)  Resp 20  SpO2 98%  LMP 12/08/2012 Physical Exam Physical Exam  Constitutional: morbidly obese, oriented to person, place, and time. appears well-developed and well-nourished. No distress.  HENT:  Head: Normocephalic and atraumatic.  Eyes: EOMI. PERRL.  Neck: Normal range of motion.  Cardiovascular: RRR, no m/r/g, 2+ distal pulses,  Pulmonary/Chest: Effort normal and breath sounds normal. No respiratory  distress.  Abdominal: Soft. Bowel sounds are normal. NonTTP, no distension.  Musculoskeletal: Normal range of motion. Non ttp, no effusion.  Neurological: alert and oriented to person, place, and time.  Skin: Skin is warm. No rash noted. non diaphoretic.  Psychiatric: normal mood and affect. behavior is normal. Judgment and thought content normal.   ED Course  Procedures (including critical care time) Labs Review Labs Reviewed - No data to display  Imaging Review No results found.   MDM  No diagnosis found.  Hyperglycemia: Chem 8 showing GLucose 517. No gap. (hypoglycemia 132 actually 138 w/ correction. Discussed case w/ Pts pcp who agrees w/ DC home and f/u tomorrow at 11:45. Pts last reported A1c of 14.   PT ttake and extra dose of Novolog today. Pt to go to ED if worsens  WBC 17 likely from hemoconcentration lab inacuracy as w/o sign of infection.   PT continues to smoke but is actively trying to quit. Encouragement given  HTN: elevation today likely from acute hyperglycemia and HA. Continue to monitor closely and f/u PCP  Precautions given and all questions answered   Linna Darner, MD Family Medicine 07/06/2014, 3:00 PM    Waldemar Dickens, MD 07/06/14 1500

## 2014-07-06 NOTE — Discharge Instructions (Signed)
Please take a double dose of your NovoLog today. Your sugar is very high but there is no sign of immediate metabolic danger unless you do not take your NovoLog. Please getyour primary care physician's office tomorrow at 1145. Please go to the emergency department if you get worse.

## 2014-07-06 NOTE — ED Notes (Signed)
Patient reports "sugar elevated, 591".  Patient is non-compliant with checking cbg's, unsure when last assessed.  Reports insulin pen ran out last night.  Also out of ibuprofen.  Overall feeling tired

## 2014-07-28 ENCOUNTER — Encounter: Payer: Self-pay | Admitting: Endocrinology

## 2014-07-28 ENCOUNTER — Ambulatory Visit (INDEPENDENT_AMBULATORY_CARE_PROVIDER_SITE_OTHER): Payer: Medicare Other | Admitting: Endocrinology

## 2014-07-28 VITALS — BP 110/72 | HR 86 | Temp 97.9°F | Resp 16 | Ht 64.0 in | Wt 388.6 lb

## 2014-07-28 DIAGNOSIS — E785 Hyperlipidemia, unspecified: Secondary | ICD-10-CM

## 2014-07-28 DIAGNOSIS — E1142 Type 2 diabetes mellitus with diabetic polyneuropathy: Secondary | ICD-10-CM

## 2014-07-28 DIAGNOSIS — N289 Disorder of kidney and ureter, unspecified: Secondary | ICD-10-CM

## 2014-07-28 DIAGNOSIS — E1165 Type 2 diabetes mellitus with hyperglycemia: Secondary | ICD-10-CM

## 2014-07-28 DIAGNOSIS — IMO0002 Reserved for concepts with insufficient information to code with codable children: Secondary | ICD-10-CM

## 2014-07-28 LAB — COMPREHENSIVE METABOLIC PANEL
ALBUMIN: 4.3 g/dL (ref 3.5–5.2)
ALK PHOS: 94 U/L (ref 39–117)
ALT: 16 U/L (ref 0–35)
AST: 20 U/L (ref 0–37)
BUN: 19 mg/dL (ref 6–23)
CO2: 27 mEq/L (ref 19–32)
Calcium: 10.2 mg/dL (ref 8.4–10.5)
Chloride: 99 mEq/L (ref 96–112)
Creatinine, Ser: 1.62 mg/dL — ABNORMAL HIGH (ref 0.40–1.20)
GFR: 43.91 mL/min — ABNORMAL LOW (ref >60.00)
GLUCOSE: 254 mg/dL — AB (ref 70–99)
POTASSIUM: 4.2 meq/L (ref 3.5–5.1)
SODIUM: 132 meq/L — AB (ref 135–145)
TOTAL PROTEIN: 8.6 g/dL — AB (ref 6.0–8.3)
Total Bilirubin: 0.4 mg/dL (ref 0.2–1.2)

## 2014-07-28 LAB — POCT URINALYSIS DIPSTICK
Bilirubin, UA: NEGATIVE
Glucose, UA: 2000
Ketones, UA: NEGATIVE
Leukocytes, UA: NEGATIVE
Nitrite, UA: NEGATIVE
RBC UA: NEGATIVE
SPEC GRAV UA: 1.01
UROBILINOGEN UA: NEGATIVE
pH, UA: 6

## 2014-07-28 LAB — MICROALBUMIN / CREATININE URINE RATIO
Creatinine,U: 34.8 mg/dL
MICROALB/CREAT RATIO: 2 mg/g (ref 0.0–30.0)

## 2014-07-28 LAB — HEMOGLOBIN A1C: Hgb A1c MFr Bld: 11 % — ABNORMAL HIGH (ref 4.6–6.5)

## 2014-07-28 LAB — GLUCOSE, POCT (MANUAL RESULT ENTRY): POC GLUCOSE: 262 mg/dL — AB (ref 70–99)

## 2014-07-28 NOTE — Patient Instructions (Addendum)
Stop taking the Novolin 70/30 insulin. Start taking NovoLog insulin 15 units before each meal  Please check blood sugars at least half the time about 2 hours after any meal and 3 times per week on waking up. Please bring blood sugar monitor to each visit. Recommended blood sugar levels about 2 hours after meal is 140-180 and on waking up 90-130  Avoid taking ibuprofen.  May take gabapentin if having pain. Take gabapentin only as needed for leg pains

## 2014-07-28 NOTE — Progress Notes (Signed)
Patient ID: Makayla [name], female   DOB: 1965-04-17, 49 y.o.   MRN: [id number]           Reason for Appointment: Consultation for Type 2 Diabetes  Referring physician: Ouida Sills  History of Present Illness:          Diagnosis: Type 2 diabetes mellitus, date of diagnosis:  1991      Past history: She believes her blood sugar level was relatively high at the time of diagnosis and she was started on insulin right away. She may have taken metformin and glipizide also at some point and these have been continued Her detailed treatment history is not available but she most likely had been on premixed insulin for some time She has had consistently poor control in the past with A1c ranging from 13-15 in 2012 and 2014 She was also admitted to the hospital in 9/14 for severe hyperglycemia  Recent history:  Patient has had poor control of her diabetes in the last few months She had been on a regimen of Lantus insulin, 70/30 insulin and Invokamet. She thinks she had been taking Invokamet last year also although she is taking it only once in the morning instead of twice a day as prescribed Her glucose was over 500 in the urgent care center in 3/16 when she presented with a headache.  Over the last month or so the patient has started changing her diet significantly after discussion with an educator in her PCP office; she has cut back on carbohydrates, fast food and high fat meals as well as cutting back on eating out. She has tried to walk a little also However for the last couple of months she has not had a fasting glucose monitor and does not know what her sugar has been She does recently feel better and has less thirst and fatigue She is now referred here for further management  Oral hypoglycemic drugs the patient is taking are: Invokamet once a day      Side effects from medications have been: None INSULIN regimen is described as: Tyler Aas 100hs, NovoLog 70/3010 units before meals   Compliance with  the medical regimen: Recently improved    Glucose monitoring: NONE FOR 2 months        Glucometer:  previously using Accu-Chek Blood Glucose readings not available   Self-care: The diet that the patient has been following is: tries to limit carbs and high fat meals, recently eliminating regular soft drinks      Meals: 3 meals per day. Breakfast is sausage and toast.  Has meat for lunch and dinner, not too many snacks           Exercise:  walking about twice a week         Dietician visit, most recent:.               Weight history:  Wt Readings from Last 3 Encounters:  07/28/14 388 lb 9.6 oz (176.268 kg)  12/09/12 414 lb 7.4 oz (188 kg)  06/16/09 495 lb (224.531 kg)    Glycemic control:   Lab Results  Component Value Date   HGBA1C 14.9* 12/08/2012   HGBA1C * 08/01/2010    13.5 (NOTE)  According to the ADA Clinical Practice Recommendations for 2011, when HbA1c is used as a screening test:   >=6.5%   Diagnostic of Diabetes Mellitus           (if abnormal result  is confirmed)  5.7-6.4%   Increased risk of developing Diabetes Mellitus  References:Diagnosis and Classification of Diabetes Mellitus,Diabetes [id number] 1):S62-S69 and Standards of Medical Care in         Diabetes - 2011,Diabetes [id number]  (Suppl 1):S11-S61.   HGBA1C 7.2* 02/13/2010   Lab Results  Component Value Date   LDLCALC 163* 02/13/2010   CREATININE 1.40* 07/06/2014         Medication List       This list is accurate as of: 07/28/14 12:24 PM.  Always use your most recent med list.               albuterol 108 (90 BASE) MCG/ACT inhaler  Commonly known as:  PROVENTIL HFA;VENTOLIN HFA  Inhale 2 puffs into the lungs every 6 (six) hours as needed. For shortness of breath     amLODipine 5 MG tablet  Commonly known as:  NORVASC  Take 5 mg by mouth daily.     Canagliflozin-Metformin HCl 50-1000 MG Tabs  Take 50-1,000 mg by  mouth 2 (two) times daily.     Colesevelam HCl 3.75 G Pack  Take 3.75 g by mouth.     gabapentin 100 MG capsule  Commonly known as:  NEURONTIN  Take 100 mg by mouth 2 (two) times daily.     gabapentin 400 MG capsule  Commonly known as:  NEURONTIN     glipiZIDE 10 MG tablet  Commonly known as:  GLUCOTROL  Take 10 mg by mouth 2 (two) times daily.     ibuprofen 800 MG tablet  Commonly known as:  ADVIL,MOTRIN     insulin aspart 100 UNIT/ML injection  Commonly known as:  novoLOG  Inject 6 Units into the skin 3 (three) times daily with meals.     Insulin Syringe-Needle U-100 29G X 1/2" 0.3 ML Misc  Commonly known as:  SAFETY-GLIDE 0.3CC SYR 29GX1/2  Use as directed.     NEXIUM 40 MG capsule  Generic drug:  esomeprazole     omega-3 acid ethyl esters 1 G capsule  Commonly known as:  LOVAZA  Take 1 g by mouth 2 (two) times daily.     rosuvastatin 40 MG tablet  Commonly known as:  CRESTOR  Take 40 mg by mouth daily.     TRESIBA FLEXTOUCH 200 UNIT/ML Sopn  Generic drug:  Insulin Degludec  100 Units at bedtime.     UNABLE TO FIND  - 1. Glucometer x 1.  - 2. Lancets x 1 box.  - 3. Glucometer strips x 1 box.        Allergies:  Allergies  Allergen Reactions  . Strawberry Swelling    Past Medical History  Diagnosis Date  . Diabetes mellitus   . Hypertension   . Cellulitis   . Hyperlipidemia   . CKD (chronic kidney disease), stage III   . Neuropathy   . Chronic bronchitis     Past Surgical History  Procedure Laterality Date  . Tonsillectomy      Family History  Problem Relation Age of Onset  . Diabetes Mother   . Diabetes Father   . Diabetes Sister   . Hypertension Mother   . Hyperlipidemia Mother   . Heart disease Father     Social  History:  reports that she has been smoking Cigarettes.  She has a 2.3 pack-year smoking history. She does not have any smokeless tobacco history on file. She reports that she drinks alcohol. She reports that she does not  use illicit drugs.    Review of Systems    Lipid history: No recent labs available, taking Crestor and WelChol both for treatment       Lab Results  Component Value Date   CHOL 260* 02/13/2010   HDL 55 02/13/2010   LDLCALC 163* 02/13/2010   TRIG 208* 02/13/2010   CHOLHDL 4.7 Ratio 02/13/2010           Constitutional:  no complaints of unusual fatigue, she has lost weight recently with improving her diet  Eyes: no history of blurred vision. Most recent eye exam was about 2 years ago with Dr. Ricki Miller   ENT: no nasal congestion, difficulty swallowing, no hoarseness   Cardiovascular: no chest pain or tightness on exertion.  No leg swelling.  Hypertension: Present for years.  No complaints of lightheadedness  Respiratory: no cough/shortness of breath  Gastrointestinal: no constipation, diarrhea, nausea or abdominal pain  Musculoskeletal: no muscle/joint aches; may get spasms in legs   Urological:   No frequency of urination recently.   Nocturia once only  Skin: no rash or infections  Neurological: no headaches.  Has had some numbness but no burning, pains or tingling in feet.  Occasionally will have throbbing in her legs and feet and will take Motrin or gabapentin for this  Psychiatric: no depression/anxiety  Endocrine/GYN: No recent cold intolerance.  She tends to get hot flashes and has not had menstrual cycles for a year now    LABS:  Office Visit on 07/28/2014  Component Date Value Ref Range Status  . POC Glucose 07/28/2014 262* 70 - 99 mg/dl Final    Physical Examination:  BP 110/72 mmHg  Pulse 86  Temp(Src) 97.9 F (36.6 C)  Resp 16  Ht 5\' 4"  (1.626 m)  Wt 388 lb 9.6 oz (176.268 kg)  BMI 66.67 kg/m2  SpO2 94%  LMP 12/08/2012  GENERAL:         Patient has morbid obesity.   HEENT:         Eye exam shows normal external appearance.  She has a mild nystagmus present Fundus exam shows no retinopathy. Oral exam shows normal mucosa .  NECK:          General:  Neck exam shows significant acanthosis.  She has no cervical lymphadenopathy.  Carotids are normal to palpation and no bruit heard.  Thyroid is not enlarged and no nodules felt.   LUNGS:         Chest is symmetrical. Lungs are clear to auscultation.Marland Kitchen   HEART:         Heart sounds:  S1 and S2 are normal. No murmurs or clicks heard., no S3 or S4.   ABDOMEN:   There is abnormal distention present. Liver and spleen are not palpable, difficult to palpate because of her massive obesity.  No other mass or tenderness present.  EXTREMITIES:     There is no edema. No skin lesions present.Marland Kitchen  NEUROLOGICAL:   Vibration sense is mildly reduced in toes. Ankle jerks are absent bilaterally.          Diabetic foot exam:  normal monofilament sensation and pulses MUSCULOSKELETAL:       There is no enlargement or deformity of the joints. Spine is normal to  inspection.Marland Kitchen   SKIN:       No rash present       ASSESSMENT:  Diabetes type 2, uncontrolled with massive obesity She is highly insulin resistant also because of her obesity and genetically as evident by her acanthosis. She is currently taking about 130 units of insulin a day which is likely to be insufficient given her weight of 176 kg Also not taking any mealtime coverage with only small doses of premixed insulin 3 times a day She is taking Invokamet and not clear this is helping at this time.  Currently taking suboptimal dose of only 1 tablet of the 50/1000 daily Unable to assess her recent blood sugars adequately as no A1c is available and she has not checked her blood sugar in a couple of months. Not clear if her Tresiba 100 units has been controlling her fasting blood sugar.  Complications: She probably has some neuropathy although exam is fairly good today;  no urine microalbumin available.  She is also overdue for eye exam  HYPERTENSION: Appears to be mild and well controlled with amlodipine only.  Currently not taking an ACE  inhibitor  HYPERLIPIDEMIA: Appears to be having significant baseline abnormal numbers and taking Crestor 40 mg and WelChol for this No recent labs available  RENAL dysfunction: This may be related to hypertension, nephropathy or combination of both with using nonsteroidal anti-inflammatory drugs and dehydration  PLAN:   She will restart checking her blood sugar, instructed her on using the One Touch Verio monitor  Check renal function today.  If renal function is adequate will increase her Invokamet accordingly  Continue Tresiba 100 units a day for now but consider increasing the dose if fasting reading persistently high  Change Novolin 70/30 to NovoLog 15 units before each meal, may need to increase this further  Follow-up with diabetes nurse educator next week for comprehensive education and help with insulin adjustment  Discussed timing and frequency of glucose monitoring and to bring monitor on the next visit for download  Regular exercise  Avoid taking Motrin because of renal dysfunction.  She can take gabapentin as needed only for leg pains  Consider ARB or ACE inhibitor if she has significant microalbuminuria  Check renal function, A1c today.  Will check urine macro abdomen and urinalysis  To check lipids unless they have been checked from PCP  Will review records from PCPs office when available including labs  Patient Instructions  Stopped taking the Novolin 70/30 insulin. Start taking NovoLog insulin 15 units before each meal  Please check blood sugars at least half the time about 2 hours after any meal and 3 times per week on waking up. Please bring blood sugar monitor to each visit. Recommended blood sugar levels about 2 hours after meal is 140-180 and on waking up 90-130  Avoid taking ibuprofen.  May take gabapentin if having pain. Take gabapentin only as needed for leg pains  Counseling time over 50% of today's 60 minute visit  Kaelan Amble 07/28/2014, 12:24  PM   Note: This office note was prepared with Estate agent. Any transcriptional errors that result from this process are unintentional.

## 2014-07-28 NOTE — Progress Notes (Signed)
Quick Note:  Please let patient know that A1c test is high at 11%, kidney test is still not good, needs to discuss with PCP. Cannot take any Motrin with kidney problems Take only 1 tablet of Invokamet for now  ______

## 2014-08-04 ENCOUNTER — Encounter: Payer: Medicare Other | Attending: Endocrinology | Admitting: Nutrition

## 2014-08-04 VITALS — Wt 382.6 lb

## 2014-08-04 DIAGNOSIS — Z713 Dietary counseling and surveillance: Secondary | ICD-10-CM | POA: Insufficient documentation

## 2014-08-04 DIAGNOSIS — E119 Type 2 diabetes mellitus without complications: Secondary | ICD-10-CM | POA: Insufficient documentation

## 2014-08-04 DIAGNOSIS — Z794 Long term (current) use of insulin: Secondary | ICD-10-CM | POA: Diagnosis not present

## 2014-08-04 NOTE — Progress Notes (Signed)
Patient reports that she has been on a diet for 2 weeks. She is taking her Tresiba 100u every night, and her Novolog 15u ac all meals Typical day: 8AM: Bfast: 1T of Rice, 1 egg, 1 sausage:   Coffee with splenda 12PM: Lunch: salad with raw veg.  And ranch dressing water to drink 1:30-2PM: popcorn, or chips 1/2 bag 7PM: supper grilled chicken or hamburger, with mashed potatoes, and broccoli and cheese, or pot pie, no bread and water to drink  SBGM:  Did not bring meter.  Says FBSs: 90s, yesterday it was 86,  acL 60s-89,  AcS: 70-76  Plan:   Switch to piece of fruit for snack, or granola bar Add protein to salad like sliced [place], ham or chicken,  And add 6 crackers Reduce Novolog:  13 acB, 12u acL, and 15 acS.

## 2014-08-04 NOTE — Patient Instructions (Signed)
Switch to piece of fruit for snack, or granola bar Add protein to salad like sliced [place], ham or chicken,  And add 6 crackers Reduce Novolog:  13 acB, 12u acL, and 15 acS

## 2014-08-13 ENCOUNTER — Other Ambulatory Visit (INDEPENDENT_AMBULATORY_CARE_PROVIDER_SITE_OTHER): Payer: Medicare Other

## 2014-08-13 DIAGNOSIS — E785 Hyperlipidemia, unspecified: Secondary | ICD-10-CM

## 2014-08-13 DIAGNOSIS — IMO0002 Reserved for concepts with insufficient information to code with codable children: Secondary | ICD-10-CM

## 2014-08-13 DIAGNOSIS — E1165 Type 2 diabetes mellitus with hyperglycemia: Secondary | ICD-10-CM

## 2014-08-13 LAB — LIPID PANEL
CHOLESTEROL: 169 mg/dL (ref 0–200)
HDL: 52.6 mg/dL (ref >39.00)
LDL Cholesterol: 84 mg/dL (ref 0–99)
NONHDL: 116.4
Total CHOL/HDL Ratio: 3
Triglycerides: 161 mg/dL — ABNORMAL HIGH (ref 0.0–149.0)
VLDL: 32.2 mg/dL (ref 0.0–40.0)

## 2014-08-13 LAB — BASIC METABOLIC PANEL
BUN: 12 mg/dL (ref 6–23)
CO2: 30 mEq/L (ref 19–32)
Calcium: 10.1 mg/dL (ref 8.4–10.5)
Chloride: 101 mEq/L (ref 96–112)
Creatinine, Ser: 1.52 mg/dL — ABNORMAL HIGH (ref 0.40–1.20)
GFR: 47.25 mL/min — ABNORMAL LOW (ref >60.00)
Glucose, Bld: 171 mg/dL — ABNORMAL HIGH (ref 70–99)
POTASSIUM: 4.5 meq/L (ref 3.5–5.1)
SODIUM: 138 meq/L (ref 135–145)

## 2014-08-14 LAB — FRUCTOSAMINE: Fructosamine: 356 umol/L — ABNORMAL HIGH (ref 0–285)

## 2014-08-19 ENCOUNTER — Ambulatory Visit (INDEPENDENT_AMBULATORY_CARE_PROVIDER_SITE_OTHER): Payer: Medicare Other | Admitting: Endocrinology

## 2014-08-19 ENCOUNTER — Encounter: Payer: Self-pay | Admitting: Endocrinology

## 2014-08-19 VITALS — BP 118/72 | HR 97 | Temp 97.7°F | Resp 16 | Ht 64.0 in | Wt 396.0 lb

## 2014-08-19 DIAGNOSIS — E1122 Type 2 diabetes mellitus with diabetic chronic kidney disease: Secondary | ICD-10-CM | POA: Diagnosis not present

## 2014-08-19 DIAGNOSIS — E785 Hyperlipidemia, unspecified: Secondary | ICD-10-CM

## 2014-08-19 DIAGNOSIS — N182 Chronic kidney disease, stage 2 (mild): Secondary | ICD-10-CM

## 2014-08-19 DIAGNOSIS — E119 Type 2 diabetes mellitus without complications: Secondary | ICD-10-CM | POA: Diagnosis not present

## 2014-08-19 LAB — POCT GLUCOSE (DEVICE FOR HOME USE): Glucose Fasting, POC: 320 mg/dL — AB (ref 70–99)

## 2014-08-19 NOTE — Patient Instructions (Addendum)
Check blood sugars on waking up .Marland Kitchen3-4 .Marland Kitchen times a week Also check blood sugars about 2 hours after a meal and do this after different meals by rotation  Recommended blood sugar levels on waking up is 90-130 and about 2 hours after meal is 140-180 Please bring blood sugar monitor to each visit.  Tresiba 50 in am and 50 at dinner  No change in Novolog, use 18 units if eating cereal

## 2014-08-19 NOTE — Progress Notes (Signed)
Patient ID: Makayla [name], female   DOB: 10-07-1965, 49 y.o.   MRN: [id number]           Reason for Appointment: Follow-up for Type 2 Diabetes  Referring physician: Ouida Sills  History of Present Illness:          Diagnosis: Type 2 diabetes mellitus, date of diagnosis:  1991      Past history: She believes her blood sugar level was relatively high at the time of diagnosis and she was started on insulin right away. She may have taken metformin and glipizide also at some point and these have been continued Her detailed treatment history is not available but she most likely had been on premixed insulin for some time She has had consistently poor control in the past with A1c ranging from 13-15 in 2012 and 2014 She was also admitted to the hospital in 9/14 for severe hyperglycemia  Recent history:  Patient has had poor control of her diabetes prior to her consultation She had been on a regimen of Lantus insulin, 70/30 insulin and Invokamet. Her premixed insulin was changed to NOVOLOG 15 units before meals Also her Invokamet was reduced to one tablet today in the morning because of renal dysfunction She was seen by nurse educator in 4/16 and her insulin doses were reduced at breakfast and lunch because of reportedly low normal readings at home although she did not take her meter for review Also today has not brought her monitor for review  Current blood sugar patterns and problems identified:  She still claims her blood sugars are low normal and usually below 100 for breakfast and supper even though her lab glucose was 171 and her glucose today is 320 in the office nonfasting  She thinks she has taken her Novolog consistently before meals as directed  Has not checked any readings after meals especially supper  Not able to lose weight, in fact her weight has gone up  She thinks she is trying to walk regularly  Her diet was reviewed by nurse educator but she may be still eating unbalanced  meals such as cereal this morning.  Still does not understand much about meal planning   Overall control is still high as judged by her fructosamine of 356  Oral hypoglycemic drugs the patient is taking are: Invokamet once a day      Side effects from medications have been: None INSULIN regimen is described as: Tresiba 100 units hs, NovoLog 13--12--15 units before meals   Compliance with the medical regimen: Recently improved    Glucose monitoring:       Glucometer:  One Touch Verio Blood Glucose readings  By recall   PRE-MEAL Breakfast Lunch Dinner Bedtime Overall  Glucose range: 62-90  86-100    Mean/median:         Self-care: The diet that the patient has been following is: tries to limit carbs and high fat meals, no regular soft drinks recently      Meals: 3 meals per day. Breakfast is cereal or sausage and toast.  Has meat for lunch and dinner, not too many snacks           Exercise:  walking about twice a week         Dietician visit, most recent: CDE 4/16.                Weight history:  Wt Readings from Last 3 Encounters:  08/19/14 396 lb (179.624 kg)  08/04/14 382 lb  9.6 oz (173.546 kg)  07/28/14 388 lb 9.6 oz (176.268 kg)    Glycemic control:   Lab Results  Component Value Date   HGBA1C 11.0* 07/28/2014   HGBA1C 14.9* 12/08/2012   HGBA1C * 08/01/2010    13.5 (NOTE)                                                                       According to the ADA Clinical Practice Recommendations for 2011, when HbA1c is used as a screening test:   >=6.5%   Diagnostic of Diabetes Mellitus           (if abnormal result  is confirmed)  5.7-6.4%   Increased risk of developing Diabetes Mellitus  References:Diagnosis and Classification of Diabetes Mellitus,Diabetes [id number] 1):S62-S69 and Standards of Medical Care in         Diabetes - 2011,Diabetes Care,2011,34  (Suppl 1):S11-S61.   Lab Results  Component Value Date   MICROALBUR <0.7 07/28/2014   LDLCALC 84  08/13/2014   CREATININE 1.52* 08/13/2014         Medication List       This list is accurate as of: 08/19/14  4:44 PM.  Always use your most recent med list.               albuterol 108 (90 BASE) MCG/ACT inhaler  Commonly known as:  PROVENTIL HFA;VENTOLIN HFA  Inhale 2 puffs into the lungs every 6 (six) hours as needed. For shortness of breath     amLODipine 5 MG tablet  Commonly known as:  NORVASC  Take 5 mg by mouth daily.     B-D UF III MINI PEN NEEDLES 31G X 5 MM Misc  Generic drug:  Insulin Pen Needle  See admin instructions.     Canagliflozin-Metformin HCl 50-1000 MG Tabs  Take 50-1,000 mg by mouth 2 (two) times daily.     Colesevelam HCl 3.75 G Pack  Take 3.75 g by mouth.     gabapentin 100 MG capsule  Commonly known as:  NEURONTIN  Take 100 mg by mouth 2 (two) times daily.     glipiZIDE 10 MG tablet  Commonly known as:  GLUCOTROL  Take 10 mg by mouth 2 (two) times daily.     ibuprofen 800 MG tablet  Commonly known as:  ADVIL,MOTRIN     insulin aspart 100 UNIT/ML injection  Commonly known as:  novoLOG  Inject 6 Units into the skin 3 (three) times daily with meals.     Insulin Syringe-Needle U-100 29G X 1/2" 0.3 ML Misc  Commonly known as:  SAFETY-GLIDE 0.3CC SYR 29GX1/2  Use as directed.     B-D INS SYR ULTRAFINE 1CC/31G 31G X 5/16" 1 ML Misc  Generic drug:  Insulin Syringe-Needle U-100  3 (three) times daily.     NEXIUM 40 MG capsule  Generic drug:  esomeprazole     NOVOLIN 70/30 (70-30) 100 UNIT/ML injection  Generic drug:  insulin NPH-regular Human  Inject 10 Units into the skin 3 (three) times daily.     omega-3 acid ethyl esters 1 G capsule  Commonly known as:  LOVAZA  Take 1 g by mouth 2 (two) times daily.     rosuvastatin 40 MG tablet  Commonly known as:  CRESTOR  Take 40 mg by mouth daily.     TRESIBA FLEXTOUCH 200 UNIT/ML Sopn  Generic drug:  Insulin Degludec  100 Units at bedtime.     UNABLE TO FIND  - 1. Glucometer x  1.  - 2. Lancets x 1 box.  - 3. Glucometer strips x 1 box.        Allergies:  Allergies  Allergen Reactions  . Strawberry Swelling    Past Medical History  Diagnosis Date  . Diabetes mellitus   . Hypertension   . Cellulitis   . Hyperlipidemia   . CKD (chronic kidney disease), stage III   . Neuropathy   . Chronic bronchitis     Past Surgical History  Procedure Laterality Date  . Tonsillectomy      Family History  Problem Relation Age of Onset  . Diabetes Mother   . Diabetes Father   . Diabetes Sister   . Hypertension Mother   . Hyperlipidemia Mother   . Heart disease Father     Social History:  reports that she has been smoking Cigarettes.  She has a 2.3 pack-year smoking history. She does not have any smokeless tobacco history on file. She reports that she drinks alcohol. She reports that she does not use illicit drugs.    Review of Systems    Lipid history: Has recent labs available,  taking Crestor and WelChol both for treatment       Lab Results  Component Value Date   CHOL 169 08/13/2014   HDL 52.60 08/13/2014   LDLCALC 84 08/13/2014   TRIG 161.0* 08/13/2014   CHOLHDL 3 08/13/2014           Hypertension: Present for years.  No complaints of lightheadedness   Neurological: no headaches.  Has had some numbness but no burning, pains or tingling in feet.  Occasionally will have throbbing in her legs and feet and will take gabapentin for this  CKD: She  saw the nephrologist today and no changes were made and no labs drawn  LABS:  Office Visit on 08/19/2014  Component Date Value Ref Range Status  . Glucose Fasting, POC 08/19/2014 320* 70 - 99 mg/dL Final  Appointment on 08/13/2014  Component Date Value Ref Range Status  . Sodium 08/13/2014 138  135 - 145 mEq/L Final  . Potassium 08/13/2014 4.5  3.5 - 5.1 mEq/L Final  . Chloride 08/13/2014 101  96 - 112 mEq/L Final  . CO2 08/13/2014 30  19 - 32 mEq/L Final  . Glucose, Bld 08/13/2014 171* 70 -  99 mg/dL Final  . BUN 08/13/2014 12  6 - 23 mg/dL Final  . Creatinine, Ser 08/13/2014 1.52* 0.40 - 1.20 mg/dL Final  . Calcium 08/13/2014 10.1  8.4 - 10.5 mg/dL Final  . GFR 08/13/2014 47.25* >60.00 mL/min Final  . Fructosamine 08/13/2014 356* 0 - 285 umol/L Final   Comment: Published reference interval for apparently healthy subjects between age 43 and 72 is 35 - 285 umol/L and in a poorly controlled diabetic population is 228 - 563 umol/L with a mean of 396 umol/L.   Marland Kitchen Cholesterol 08/13/2014 169  0 - 200 mg/dL Final   ATP III Classification       Desirable:  < 200 mg/dL               Borderline High:  200 - 239 mg/dL          High:  > = 240  mg/dL  . Triglycerides 08/13/2014 161.0* 0.0 - 149.0 mg/dL Final   Normal:  <150 mg/dLBorderline High:  150 - 199 mg/dL  . HDL 08/13/2014 52.60  >39.00 mg/dL Final  . VLDL 08/13/2014 32.2  0.0 - 40.0 mg/dL Final  . LDL Cholesterol 08/13/2014 84  0 - 99 mg/dL Final  . Total CHOL/HDL Ratio 08/13/2014 3   Final                  Men          Women1/2 Average Risk     3.4          3.3Average Risk          5.0          4.42X Average Risk          9.6          7.13X Average Risk          15.0          11.0                      . NonHDL 08/13/2014 116.40   Final   NOTE:  Non-HDL goal should be 30 mg/dL higher than patient's LDL goal (i.e. LDL goal of < 70 mg/dL, would have non-HDL goal of < 100 mg/dL)    Physical Examination:  BP 118/72 mmHg  Pulse 97  Temp(Src) 97.7 F (36.5 C)  Resp 16  Ht 5\' 4"  (1.626 m)  Wt 396 lb (179.624 kg)  BMI 67.94 kg/m2  SpO2 95%  LMP 12/08/2012      ASSESSMENT:  Diabetes type 2, uncontrolled with massive obesity She is highly insulin resistant also because of her obesity and genetically as evident by her acanthosis. Her blood sugars are still running relatively high even with using NovoLog to cover her meals She has not brought her monitor for review even today and are with nurse educator and even though she  claims her blood sugars are low normal data consistently high in the lab in the office today She thinks she is using a new monitor that was given to her Fructosamine also indicates overall high readings recently Most likely is not benefiting much from Invokamet because of using a low-dose and renal dysfunction  RENAL dysfunction: This may be related to hypertension, nephropathy or combination of both  Creatinine slightly better with stopping Motrin but still abnormal  PLAN:   She will checking her blood sugar after meals consistently as directed  Discussed blood sugar targets  She will need to bring her monitor for review to verify that she is checking her blood sugar as directed and accuracy of the meter  For now will split her Tresiba to 50 units twice a day as blood sugars are higher during the daytime even without any food intake today.  May need to verify that she is actually taking the correct amount and compliance with daily injections  Encouraged her to increase frequency and duration of exercise  Consider follow-up with nurse educator and dietitian   She will increase her mealtime insulin at least when eating high carbohydrate meals like cereals  Patient Instructions  Check blood sugars on waking up .Marland Kitchen3-4 .Marland Kitchen times a week Also check blood sugars about 2 hours after a meal and do this after different meals by rotation  Recommended blood sugar levels on waking up is 90-130 and about 2 hours after meal is 140-180 Please bring blood  sugar monitor to each visit.  Tresiba 50 in am and 50 at dinner  No change in Novolog, use 18 units if eating cereal   Counseling time on subjects discussed above is over 50% of today's 25 minute visit  Rachna Schonberger 08/19/2014, 4:44 PM   Note: This office note was prepared with Dragon voice recognition system technology. Any transcriptional errors that result from this process are unintentional.

## 2014-08-20 ENCOUNTER — Other Ambulatory Visit: Payer: Self-pay | Admitting: *Deleted

## 2014-08-20 MED ORDER — INSULIN NPH ISOPHANE & REGULAR (70-30) 100 UNIT/ML ~~LOC~~ SUSP: SUBCUTANEOUS | Status: DC

## 2014-08-26 ENCOUNTER — Other Ambulatory Visit: Payer: Self-pay | Admitting: *Deleted

## 2014-08-26 MED ORDER — INSULIN ASPART 100 UNIT/ML ~~LOC~~ SOLN: 6.0000 [IU] | Freq: Three times a day (TID) | SUBCUTANEOUS | Status: DC

## 2014-09-09 ENCOUNTER — Telehealth: Payer: Self-pay | Admitting: Endocrinology

## 2014-09-09 ENCOUNTER — Ambulatory Visit: Payer: Medicare Other | Admitting: Endocrinology

## 2014-09-09 ENCOUNTER — Encounter: Payer: Self-pay | Admitting: *Deleted

## 2014-09-09 NOTE — Telephone Encounter (Signed)
Patient no showed today's appt. Please advise on how to follow up. °A. No follow up necessary. °B. Follow up urgent. Contact patient immediately. °C. Follow up necessary. Contact patient and schedule visit in ___ days. °D. Follow up advised. Contact patient and schedule visit in ____weeks. ° °

## 2014-09-09 NOTE — Telephone Encounter (Signed)
Letter mailed

## 2014-09-13 NOTE — Telephone Encounter (Signed)
Please schedule for follow-up at the first available appointment

## 2014-09-20 ENCOUNTER — Ambulatory Visit (INDEPENDENT_AMBULATORY_CARE_PROVIDER_SITE_OTHER): Payer: Medicare Other | Admitting: Endocrinology

## 2014-09-20 ENCOUNTER — Other Ambulatory Visit: Payer: Self-pay | Admitting: *Deleted

## 2014-09-20 ENCOUNTER — Encounter: Payer: Self-pay | Admitting: Endocrinology

## 2014-09-20 VITALS — BP 120/65 | HR 76 | Temp 97.7°F | Resp 18 | Ht 64.0 in | Wt 390.0 lb

## 2014-09-20 DIAGNOSIS — IMO0002 Reserved for concepts with insufficient information to code with codable children: Secondary | ICD-10-CM

## 2014-09-20 DIAGNOSIS — I951 Orthostatic hypotension: Secondary | ICD-10-CM | POA: Diagnosis not present

## 2014-09-20 DIAGNOSIS — E1122 Type 2 diabetes mellitus with diabetic chronic kidney disease: Secondary | ICD-10-CM

## 2014-09-20 DIAGNOSIS — E1165 Type 2 diabetes mellitus with hyperglycemia: Secondary | ICD-10-CM | POA: Diagnosis not present

## 2014-09-20 DIAGNOSIS — E119 Type 2 diabetes mellitus without complications: Secondary | ICD-10-CM

## 2014-09-20 DIAGNOSIS — N182 Chronic kidney disease, stage 2 (mild): Secondary | ICD-10-CM

## 2014-09-20 LAB — BASIC METABOLIC PANEL
BUN: 16 mg/dL (ref 6–23)
CALCIUM: 9.9 mg/dL (ref 8.4–10.5)
CO2: 28 meq/L (ref 19–32)
Chloride: 102 mEq/L (ref 96–112)
Creatinine, Ser: 1.55 mg/dL — ABNORMAL HIGH (ref 0.40–1.20)
GFR: 46.17 mL/min — ABNORMAL LOW (ref >60.00)
GLUCOSE: 214 mg/dL — AB (ref 70–99)
POTASSIUM: 4.4 meq/L (ref 3.5–5.1)
Sodium: 136 mEq/L (ref 135–145)

## 2014-09-20 NOTE — Progress Notes (Signed)
Patient ID: Makayla [name], female   DOB: January 13, 1966, 49 y.o.   MRN: [id number]           Reason for Appointment: Follow-up for Type 2 Diabetes  Referring physician: Ouida Sills  History of Present Illness:          Diagnosis: Type 2 diabetes mellitus, date of diagnosis:  1991      Past history: She believes her blood sugar level was relatively high at the time of diagnosis and she was started on insulin right away. She may have taken metformin and glipizide also at some point and these have been continued Her detailed treatment history is not available but she most likely had been on premixed insulin for some time She has had consistently poor control in the past with A1c ranging from 13-15 in 2012 and 2014 She was also admitted to the hospital in 9/14 for severe hyperglycemia  Recent history:  Patient has had poor control of her diabetes prior to her consultation She had been on a regimen of Lantus insulin, 70/30 insulin and Invokamet. She is now now regimen of Novolog and Tyler Aas; also her Invokamet was reduced to once a day because of renal dysfunction  Although she claimed that her blood sugars were low normal since her initial visit it appears that she has not checked her blood sugars at all as her monitor reveals no testing.  She says she does not know how to use the new monitor, previously was using a One Touch select which she has not used in a while also Fasting glucose is about 190 today in the office She also says that she has had better appetite recently and previously was not eating as much Unable to assess her blood sugar control today as A1c is due as yet although her fructosamine was still high last month  Oral hypoglycemic drugs the patient is taking are: Invokamet once a day      Side effects from medications have been: None  INSULIN regimen is described as: [place] 50 twice a day units , NovoLog15 units before meals   Compliance with the medical regimen: Recently improved     Glucose monitoring:       Glucometer:  One Touch Verio Blood Glucose readings  none   Self-care: The diet that the patient has been following is: tries to limit carbs and high fat meals, no regular soft drinks       Meals: 3 meals per day. Breakfast is cereal or sausage and toast.  Has meat for lunch and dinner, not too many snacks           Exercise:  walking about twice a week, some gardening         Dietician visit, most recent:none CDE 4/16.                Weight history:  Wt Readings from Last 3 Encounters:  09/20/14 390 lb (176.903 kg)  08/19/14 396 lb (179.624 kg)  08/04/14 382 lb 9.6 oz (173.546 kg)    Glycemic control:   Lab Results  Component Value Date   HGBA1C 11.0* 07/28/2014   HGBA1C 14.9* 12/08/2012   HGBA1C * 08/01/2010    13.5 (NOTE)  According to the ADA Clinical Practice Recommendations for 2011, when HbA1c is used as a screening test:   >=6.5%   Diagnostic of Diabetes Mellitus           (if abnormal result  is confirmed)  5.7-6.4%   Increased risk of developing Diabetes Mellitus  References:Diagnosis and Classification of Diabetes Mellitus,Diabetes D8842878 1):S62-S69 and Standards of Medical Care in         Diabetes - 2011,Diabetes [id number]  (Suppl 1):S11-S61.   Lab Results  Component Value Date   MICROALBUR <0.7 07/28/2014   LDLCALC 84 08/13/2014   CREATININE 1.52* 08/13/2014         Medication List       This list is accurate as of: 09/20/14 10:23 AM.  Always use your most recent med list.               albuterol 108 (90 BASE) MCG/ACT inhaler  Commonly known as:  PROVENTIL HFA;VENTOLIN HFA  Inhale 2 puffs into the lungs every 6 (six) hours as needed. For shortness of breath     amLODipine 5 MG tablet  Commonly known as:  NORVASC  Take 5 mg by mouth daily.     B-D UF III MINI PEN NEEDLES 31G X 5 MM Misc  Generic drug:  Insulin Pen Needle  See admin  instructions.     Canagliflozin-Metformin HCl 50-1000 MG Tabs  Take 50-1,000 mg by mouth 2 (two) times daily.     Colesevelam HCl 3.75 G Pack  Take 3.75 g by mouth.     gabapentin 100 MG capsule  Commonly known as:  NEURONTIN  Take 100 mg by mouth 2 (two) times daily.     glipiZIDE 10 MG tablet  Commonly known as:  GLUCOTROL  Take 10 mg by mouth 2 (two) times daily.     ibuprofen 800 MG tablet  Commonly known as:  ADVIL,MOTRIN     insulin aspart 100 UNIT/ML injection  Commonly known as:  novoLOG  Inject 6 Units into the skin 3 (three) times daily with meals.     Insulin Syringe-Needle U-100 29G X 1/2" 0.3 ML Misc  Commonly known as:  SAFETY-GLIDE 0.3CC SYR 29GX1/2  Use as directed.     B-D INS SYR ULTRAFINE 1CC/31G 31G X 5/16" 1 ML Misc  Generic drug:  Insulin Syringe-Needle U-100  3 (three) times daily.     NEXIUM 40 MG capsule  Generic drug:  esomeprazole     omega-3 acid ethyl esters 1 G capsule  Commonly known as:  LOVAZA  Take 1 g by mouth 2 (two) times daily.     rosuvastatin 40 MG tablet  Commonly known as:  CRESTOR  Take 40 mg by mouth daily.     TRESIBA FLEXTOUCH 200 UNIT/ML Sopn  Generic drug:  Insulin Degludec  50 Units 2 (two) times daily.     UNABLE TO FIND  1. Glucometer x 1. 2. Lancets x 1 box. 3. Glucometer strips x 1 box.        Allergies:  Allergies  Allergen Reactions  . Strawberry Swelling    Past Medical History  Diagnosis Date  . Diabetes mellitus   . Hypertension   . Cellulitis   . Hyperlipidemia   . CKD (chronic kidney disease), stage III   . Neuropathy   . Chronic bronchitis     Past Surgical History  Procedure Laterality Date  . Tonsillectomy      Family History  Problem Relation Age of  Onset  . Diabetes Mother   . Diabetes Father   . Diabetes Sister   . Hypertension Mother   . Hyperlipidemia Mother   . Heart disease Father     Social History:  reports that she has been smoking Cigarettes.  She has a 2.3  pack-year smoking history. She does not have any smokeless tobacco history on file. She reports that she drinks alcohol. She reports that she does not use illicit drugs.    Review of Systems    Lipid history:  taking Crestor and WelChol both for treatment       Lab Results  Component Value Date   CHOL 169 08/13/2014   HDL 52.60 08/13/2014   LDLCALC 84 08/13/2014   TRIG 161.0* 08/13/2014   CHOLHDL 3 08/13/2014           Hypertension: Present for years.  No complaints of lightheadedness and is taking only amlodipine currently  Neurological: no headaches.  Has had some numbness but no burning, pains or tingling in feet.  Occasionally will have throbbing in her legs and feet and will take gabapentin for this  CKD: She  saw the nephrologist and was told to stop Motrin  She is still complaining of generalized body aches and has not discussed with PCP  LABS:  No visits with results within 1 Week(s) from this visit. Latest known visit with results is:  Office Visit on 08/19/2014  Component Date Value Ref Range Status  . Glucose Fasting, POC 08/19/2014 320* 70 - 99 mg/dL Final    Physical Examination:  BP 120/65 mmHg  Pulse 76  Temp(Src) 97.7 F (36.5 C)  Resp 18  Ht 5\' 4"  (1.626 m)  Wt 390 lb (176.903 kg)  BMI 66.91 kg/m2  SpO2 91%  LMP 12/08/2012  Repeat standing blood pressure  108/60    ASSESSMENT:  Diabetes type 2, uncontrolled with massive obesity Her blood sugars are still relatively high and she has not monitored at home Even though she has seen the nurse educator she still appears to need significant amount of diabetes education See history of present illness for detailed discussion of his current management, blood sugar patterns and problems identified Currently because of her hyperglycemia she will need a higher dose of insulin, at least basal but most likely mealtime insulin also especially since she is having less anorexia than before Most likely is not  benefiting much from Invokamet because of using a low-dose and renal dysfunction; will need to stop this since renal function is still significantly impaired  RENAL dysfunction: This may be related to hypertension, nephropathy along with relatively low blood pressure today  PLAN:   She will start checking her blood sugar as directed including some after meals consistently   Increase Tresiba in evening by 10 units  Increase Novolog at suppertime to 25  She will call if she has any tendency to low blood sugars but will need short-term follow-up  She will stop the Invokamet  Needs more diabetes education  Encouraged her to do more regular exercise  She will stop her amlodipine for now as blood pressure is significantly low  Balanced meals consistently with some protein  Patient Instructions  Check blood sugars on waking up .. 4 .. times a week Also check blood sugars about 2 hours after a meal and do this after different meals by rotation  Recommended blood sugar levels on waking up is 90-130 and about 2 hours after meal is 140-180 Please bring blood  sugar monitor to each visit.  TRESIBA 50 UNITS IN AM AND 60 aT NIGHT  Novolog 15 am 15 lunch and 25 at supper before eating  Stop amlodipine   Counseling time on subjects discussed above is over 50% of today's 25 minute visit  Makayla [name] 09/20/2014, 10:23 AM   Note: This office note was prepared with Estate agent. Any transcriptional errors that result from this process are unintentional.

## 2014-09-20 NOTE — Patient Instructions (Addendum)
Check blood sugars on waking up .. 4 .. times a week Also check blood sugars about 2 hours after a meal and do this after different meals by rotation  Recommended blood sugar levels on waking up is 90-130 and about 2 hours after meal is 140-180 Please bring blood sugar monitor to each visit.  TRESIBA 50 UNITS IN AM AND 60 aT NIGHT  Novolog 15 am 15 lunch and 25 at supper before eating  Stop amlodipine

## 2014-10-05 ENCOUNTER — Encounter: Payer: Medicare Other | Attending: Endocrinology | Admitting: Nutrition

## 2014-10-05 VITALS — Wt 378.2 lb

## 2014-10-05 DIAGNOSIS — Z713 Dietary counseling and surveillance: Secondary | ICD-10-CM | POA: Diagnosis not present

## 2014-10-05 DIAGNOSIS — E119 Type 2 diabetes mellitus without complications: Secondary | ICD-10-CM | POA: Diagnosis not present

## 2014-10-05 DIAGNOSIS — Z794 Long term (current) use of insulin: Secondary | ICD-10-CM | POA: Diagnosis not present

## 2014-10-05 NOTE — Progress Notes (Signed)
Patient did not bring in meter.  Says she did not start on the Tresiba until last night, because the drug store did not have it in stock, and has been off that insulin for 2 days.  She took 50u last night, and 50u this AM.  Corrected her dose per Dr. Ronnie Derby order of 50u qAM, and 60u q PM She is taking Novolog 15 acB, and acL, and 25u acS. Bfast: in CornFlakes with splenda and coffee at 9AM,  Lunch is at 12PM: balogna sandwich or hamburger with chips and flavored no calorie water to drink.  Supper is at 7PM: with meat--chicken tonight 6 ounces, 1 green veg., and 2 servings of a starchy veg.  With water to drink.   Hs snacks she says vary, but are usually around 200 calories or less (30 grams of carb usually) Weight is down 12 pounds.  She says she is on a schedule with taking her insulin and eating.  She finds this helpful  She says that her FBS this AM was 119 .  She says that all of the other ones were "high".  She is testing ac B and HS.  She was told to call up here if blood sugars start to drop.  She agreed to do this. She was encouraged to test today before lunch, and if blood sugar is over 200, to switch to 2 pieces of toast with melted cheese for breakfast.  Discussed the importance of having protein at each meal.  She agreed to do this.

## 2014-10-05 NOTE — Patient Instructions (Signed)
Test blood sugar before lunch today.  If over 200, stop the cereal and eat 2 pieces of toast with peanut butter, or melted cheese.

## 2014-10-27 ENCOUNTER — Other Ambulatory Visit (INDEPENDENT_AMBULATORY_CARE_PROVIDER_SITE_OTHER): Payer: Medicare Other

## 2014-10-27 DIAGNOSIS — IMO0002 Reserved for concepts with insufficient information to code with codable children: Secondary | ICD-10-CM

## 2014-10-27 DIAGNOSIS — E1165 Type 2 diabetes mellitus with hyperglycemia: Secondary | ICD-10-CM

## 2014-10-27 LAB — COMPREHENSIVE METABOLIC PANEL
ALT: 8 U/L (ref 0–35)
AST: 13 U/L (ref 0–37)
Albumin: 4.1 g/dL (ref 3.5–5.2)
Alkaline Phosphatase: 77 U/L (ref 39–117)
BUN: 20 mg/dL (ref 6–23)
CALCIUM: 9.9 mg/dL (ref 8.4–10.5)
CO2: 28 mEq/L (ref 19–32)
Chloride: 103 mEq/L (ref 96–112)
Creatinine, Ser: 1.57 mg/dL — ABNORMAL HIGH (ref 0.40–1.20)
GFR: 45.47 mL/min — ABNORMAL LOW (ref >60.00)
Glucose, Bld: 110 mg/dL — ABNORMAL HIGH (ref 70–99)
Potassium: 4.1 mEq/L (ref 3.5–5.1)
Sodium: 139 mEq/L (ref 135–145)
Total Bilirubin: 0.3 mg/dL (ref 0.2–1.2)
Total Protein: 8.3 g/dL (ref 6.0–8.3)

## 2014-10-27 LAB — HEMOGLOBIN A1C: Hgb A1c MFr Bld: 8.6 % — ABNORMAL HIGH (ref 4.6–6.5)

## 2014-11-01 ENCOUNTER — Encounter: Payer: Self-pay | Admitting: Endocrinology

## 2014-11-01 ENCOUNTER — Encounter: Payer: Medicare Other | Admitting: Endocrinology

## 2014-11-03 ENCOUNTER — Encounter: Payer: Self-pay | Admitting: Endocrinology

## 2014-11-03 ENCOUNTER — Ambulatory Visit (INDEPENDENT_AMBULATORY_CARE_PROVIDER_SITE_OTHER): Payer: Medicare Other | Admitting: Endocrinology

## 2014-11-03 ENCOUNTER — Other Ambulatory Visit: Payer: Self-pay | Admitting: *Deleted

## 2014-11-03 VITALS — BP 124/70 | HR 94 | Temp 98.0°F | Resp 18 | Ht 64.0 in

## 2014-11-03 DIAGNOSIS — E1122 Type 2 diabetes mellitus with diabetic chronic kidney disease: Secondary | ICD-10-CM

## 2014-11-03 DIAGNOSIS — E1165 Type 2 diabetes mellitus with hyperglycemia: Secondary | ICD-10-CM

## 2014-11-03 DIAGNOSIS — N182 Chronic kidney disease, stage 2 (mild): Secondary | ICD-10-CM

## 2014-11-03 DIAGNOSIS — IMO0002 Reserved for concepts with insufficient information to code with codable children: Secondary | ICD-10-CM

## 2014-11-03 MED ORDER — GLUCOSE BLOOD VI STRP: ORAL_STRIP | Status: AC

## 2014-11-03 NOTE — Patient Instructions (Addendum)
Check blood sugars on waking up .Marland Kitchen 3. times a week Also check blood sugars about 2 hours after a meal and do this after different meals by rotation  Recommended blood sugar levels on waking up is 90-130 and about 2 hours after meal is 140-180 Please bring blood sugar monitor to each visit.  Stop Glipizide

## 2014-11-03 NOTE — Progress Notes (Signed)
Patient ID: Makayla [name], female   DOB: 11-01-1965, 49 y.o.   MRN: [id number]           Reason for Appointment: Follow-up for Type 2 Diabetes  Referring physician: Ouida Sills  History of Present Illness:          Diagnosis: Type 2 diabetes mellitus, date of diagnosis:  1991      Past history: She believes her blood sugar level was relatively high at the time of diagnosis and she was started on insulin right away. She may have taken metformin and glipizide also at some point and these have been continued Her detailed treatment history is not available but she most likely had been on premixed insulin for some time She has had consistently poor control in the past with A1c ranging from 13-15 in 2012 and 2014 She was also admitted to the hospital in 9/14 for severe hyperglycemia Patient has had poor control of her diabetes prior to her consultation  Recent history:   INSULIN regimen is described as: [place] 50 twice a day, NovoLog 15 units before breakfast and lunch and 25 before supper    She had been on a regimen of Lantus insulin, 70/30 insulin and Invokamet. She is on a regimen of Novolog and Tyler Aas; also her Invokamet was stopped because of renal dysfunction and uncertain benefit She has not checked her blood sugars for about a month as she could not get a refill for test strips from her PCP  Current blood sugar patterns and problems identified:  In June she was having a few readings over 350 at midmorning, late afternoon and after supper  She thinks however that she is doing much better on her diet after talking to the nurse educator and her high sugars were related to poor diet  Her fasting glucose in the lab was 110  Her A1c is somewhat better than usual  Oral hypoglycemic drugs the patient is taking are: None Side effects from medications have been: None  Compliance with the medical regimen: Recently improved    Glucose monitoring:       Glucometer:  One Touch Verio Blood  Glucose readings  readings in June ranged from 126-416 with median 197, relatively better in the morning   Self-care: The diet that the patient has been following is: tries to limit carbs and high fat meals, no regular soft drinks       Meals: 3 meals per day. Breakfast is cereal or sausage and toast.  Has meat for lunch and dinner, not too many snacks           Exercise:  walking about twice a week, some gardening, [facility]         Dietician visit, most recent:none CDE 6/16.                Weight history:  Wt Readings from Last 3 Encounters:  11/01/14 378 lb 6.4 oz (171.641 kg)  10/05/14 378 lb 3.2 oz (171.55 kg)  09/20/14 390 lb (176.903 kg)    Glycemic control:   Lab Results  Component Value Date   HGBA1C 8.6* 10/27/2014   HGBA1C 11.0* 07/28/2014   HGBA1C 14.9* 12/08/2012   Lab Results  Component Value Date   MICROALBUR <0.7 07/28/2014   LDLCALC 84 08/13/2014   CREATININE 1.57* 10/27/2014         Medication List       This list is accurate as of: 11/03/14  5:10 PM.  Always use your most recent med list.               albuterol 108 (90 BASE) MCG/ACT inhaler  Commonly known as:  PROVENTIL HFA;VENTOLIN HFA  Inhale 2 puffs into the lungs every 6 (six) hours as needed. For shortness of breath     amLODipine 5 MG tablet  Commonly known as:  NORVASC  Take 5 mg by mouth daily.     B-D UF III MINI PEN NEEDLES 31G X 5 MM Misc  Generic drug:  Insulin Pen Needle  See admin instructions.     Colesevelam HCl 3.75 G Pack  Take 3.75 g by mouth.     gabapentin 100 MG capsule  Commonly known as:  NEURONTIN  Take 100 mg by mouth 2 (two) times daily.     glucose blood test strip  Commonly known as:  ONETOUCH VERIO  Use as instructed to check blood sugar 2 times daily Dx code E11.9     ibuprofen 800 MG tablet  Commonly known as:  ADVIL,MOTRIN     insulin aspart 100 UNIT/ML injection  Commonly known as:  novoLOG  Inject 6 Units  into the skin 3 (three) times daily with meals.     Insulin Syringe-Needle U-100 29G X 1/2" 0.3 ML Misc  Commonly known as:  SAFETY-GLIDE 0.3CC SYR 29GX1/2  Use as directed.     B-D INS SYR ULTRAFINE 1CC/31G 31G X 5/16" 1 ML Misc  Generic drug:  Insulin Syringe-Needle U-100  3 (three) times daily.     NEXIUM 40 MG capsule  Generic drug:  esomeprazole     omega-3 acid ethyl esters 1 G capsule  Commonly known as:  LOVAZA  Take 1 g by mouth 2 (two) times daily.     rosuvastatin 40 MG tablet  Commonly known as:  CRESTOR  Take 40 mg by mouth daily.     TRESIBA FLEXTOUCH 200 UNIT/ML Sopn  Generic drug:  Insulin Degludec  50 Units 2 (two) times daily.     UNABLE TO FIND  1. Glucometer x 1. 2. Lancets x 1 box. 3. Glucometer strips x 1 box.        Allergies:  Allergies  Allergen Reactions  . Strawberry Swelling    Past Medical History  Diagnosis Date  . Diabetes mellitus   . Hypertension   . Cellulitis   . Hyperlipidemia   . CKD (chronic kidney disease), stage III   . Neuropathy   . Chronic bronchitis     Past Surgical History  Procedure Laterality Date  . Tonsillectomy      Family History  Problem Relation Age of Onset  . Diabetes Mother   . Diabetes Father   . Diabetes Sister   . Hypertension Mother   . Hyperlipidemia Mother   . Heart disease Father     Social History:  reports that she has been smoking Cigarettes.  She has a 2.3 pack-year smoking history. She does not have any smokeless tobacco history on file. She reports that she drinks alcohol. She reports that she does not use illicit drugs.    Review of Systems    Lipid history:  taking Crestor and WelChol  for treatment       Lab Results  Component Value Date   CHOL 169 08/13/2014   HDL 52.60 08/13/2014   LDLCALC 84 08/13/2014   TRIG 161.0* 08/13/2014   CHOLHDL 3 08/13/2014  Hypertension: Present for years.  She was told to stop her amlodipine and her blood pressure was  low  Neurological: no headaches.  Has had some numbness but no burning, pains or tingling in feet.  Occasionally will have throbbing in her legs and feet and will take gabapentin for this  CKD: Creatinine is still relatively high.  Etiology unclear   LABS:  No visits with results within 1 Week(s) from this visit. Latest known visit with results is:  Appointment on 10/27/2014  Component Date Value Ref Range Status  . Hgb A1c MFr Bld 10/27/2014 8.6* 4.6 - 6.5 % Final   Glycemic Control Guidelines for People with Diabetes:Non Diabetic:  <6%Goal of Therapy: <7%Additional Action Suggested:  >8%   . Sodium 10/27/2014 139  135 - 145 mEq/L Final  . Potassium 10/27/2014 4.1  3.5 - 5.1 mEq/L Final  . Chloride 10/27/2014 103  96 - 112 mEq/L Final  . CO2 10/27/2014 28  19 - 32 mEq/L Final  . Glucose, Bld 10/27/2014 110* 70 - 99 mg/dL Final  . BUN 10/27/2014 20  6 - 23 mg/dL Final  . Creatinine, Ser 10/27/2014 1.57* 0.40 - 1.20 mg/dL Final  . Total Bilirubin 10/27/2014 0.3  0.2 - 1.2 mg/dL Final  . Alkaline Phosphatase 10/27/2014 77  39 - 117 U/L Final  . AST 10/27/2014 13  0 - 37 U/L Final  . ALT 10/27/2014 8  0 - 35 U/L Final  . Total Protein 10/27/2014 8.3  6.0 - 8.3 g/dL Final  . Albumin 10/27/2014 4.1  3.5 - 5.2 g/dL Final  . Calcium 10/27/2014 9.9  8.4 - 10.5 mg/dL Final  . GFR 10/27/2014 45.47* >60.00 mL/min Final    Physical Examination:  BP 124/70 mmHg  Pulse 94  Temp(Src) 98 F (36.7 C)  Resp 18  Ht 5\' 4"  (1.626 m)  SpO2 96%  LMP 12/08/2012   No edema    ASSESSMENT:  Diabetes type 2, uncontrolled with massive obesity Her blood sugars are appeared to be improving although since she has not checked her blood sugars in a month not clear what her blood sugar patterns are See history of present illness for detailed discussion of his current management, blood sugar patterns and problems identified She has lost weight since he has seen the diabetes educator and appears to be  improving her diet Fasting blood sugar is improved at 110 in the lab Not clear if she was benefiting from Invokamet and this may not be as effective with her creatinine clearance around 45  RENAL dysfunction: This may be related to hypertension, nephropathy and is unchanged  PLAN:   She will start checking her blood sugar as directed including  after meals consistently, prescription sent  No change in insulin at this time   She will start an exercise program at the [facility] and this should help  Stop glipizide for now  Consider adding low dose metformin or even consider Victoza  Patient Instructions  Check blood sugars on waking up .Marland Kitchen 3. times a week Also check blood sugars about 2 hours after a meal and do this after different meals by rotation  Recommended blood sugar levels on waking up is 90-130 and about 2 hours after meal is 140-180 Please bring blood sugar monitor to each visit.  Stop Glipizide   Counseling time on subjects discussed above is over 50% of today's 25 minute visit  Ira Busbin 11/03/2014, 5:10 PM   Note: This office note was prepared with Viviann Spare  voice recognition system technology. Any transcriptional errors that result from this process are unintentional.

## 2014-11-08 NOTE — Progress Notes (Signed)
This encounter was created in error - please disregard.

## 2015-01-17 ENCOUNTER — Encounter (HOSPITAL_COMMUNITY): Payer: Self-pay | Admitting: Emergency Medicine

## 2015-01-17 ENCOUNTER — Telehealth: Payer: Self-pay | Admitting: Endocrinology

## 2015-01-17 ENCOUNTER — Emergency Department (HOSPITAL_COMMUNITY)
Admission: EM | Admit: 2015-01-17 | Discharge: 2015-01-17 | Disposition: A | Discharge status: Home / Self Care | Payer: Medicare Other | Attending: Emergency Medicine | Admitting: Emergency Medicine

## 2015-01-17 ENCOUNTER — Other Ambulatory Visit: Payer: Self-pay | Admitting: *Deleted

## 2015-01-17 DIAGNOSIS — Z79899 Other long term (current) drug therapy: Secondary | ICD-10-CM | POA: Insufficient documentation

## 2015-01-17 DIAGNOSIS — G629 Polyneuropathy, unspecified: Secondary | ICD-10-CM | POA: Diagnosis not present

## 2015-01-17 DIAGNOSIS — Z8709 Personal history of other diseases of the respiratory system: Secondary | ICD-10-CM | POA: Insufficient documentation

## 2015-01-17 DIAGNOSIS — Z794 Long term (current) use of insulin: Secondary | ICD-10-CM | POA: Diagnosis not present

## 2015-01-17 DIAGNOSIS — N183 Chronic kidney disease, stage 3 (moderate): Secondary | ICD-10-CM | POA: Insufficient documentation

## 2015-01-17 DIAGNOSIS — Z872 Personal history of diseases of the skin and subcutaneous tissue: Secondary | ICD-10-CM | POA: Insufficient documentation

## 2015-01-17 DIAGNOSIS — M79605 Pain in left leg: Secondary | ICD-10-CM | POA: Diagnosis not present

## 2015-01-17 DIAGNOSIS — M79604 Pain in right leg: Secondary | ICD-10-CM | POA: Diagnosis not present

## 2015-01-17 DIAGNOSIS — E785 Hyperlipidemia, unspecified: Secondary | ICD-10-CM | POA: Insufficient documentation

## 2015-01-17 DIAGNOSIS — B372 Candidiasis of skin and nail: Secondary | ICD-10-CM

## 2015-01-17 DIAGNOSIS — E1165 Type 2 diabetes mellitus with hyperglycemia: Secondary | ICD-10-CM | POA: Diagnosis not present

## 2015-01-17 DIAGNOSIS — Z880 Allergy status to penicillin: Secondary | ICD-10-CM | POA: Insufficient documentation

## 2015-01-17 DIAGNOSIS — E65 Localized adiposity: Secondary | ICD-10-CM

## 2015-01-17 DIAGNOSIS — I129 Hypertensive chronic kidney disease with stage 1 through stage 4 chronic kidney disease, or unspecified chronic kidney disease: Secondary | ICD-10-CM | POA: Diagnosis not present

## 2015-01-17 DIAGNOSIS — E1100 Type 2 diabetes mellitus with hyperosmolarity without nonketotic hyperglycemic-hyperosmolar coma (NKHHC): Secondary | ICD-10-CM

## 2015-01-17 DIAGNOSIS — R739 Hyperglycemia, unspecified: Secondary | ICD-10-CM

## 2015-01-17 LAB — CBG MONITORING, ED: Glucose-Capillary: 316 mg/dL — ABNORMAL HIGH (ref 65–99)

## 2015-01-17 LAB — CBC
HCT: 45.2 % (ref 36.0–46.0)
Hemoglobin: 14.7 g/dL (ref 12.0–15.0)
MCH: 24.6 pg — ABNORMAL LOW (ref 26.0–34.0)
MCHC: 32.5 g/dL (ref 30.0–36.0)
MCV: 75.7 fL — AB (ref 78.0–100.0)
PLATELETS: 162 10*3/uL (ref 150–400)
RBC: 5.97 MIL/uL — AB (ref 3.87–5.11)
RDW: 15.2 % (ref 11.5–15.5)
WBC: 5.1 10*3/uL (ref 4.0–10.5)

## 2015-01-17 LAB — BASIC METABOLIC PANEL
Anion gap: 9 (ref 5–15)
BUN: 15 mg/dL (ref 6–20)
CHLORIDE: 98 mmol/L — AB (ref 101–111)
CO2: 27 mmol/L (ref 22–32)
CREATININE: 1.41 mg/dL — AB (ref 0.44–1.00)
Calcium: 9.4 mg/dL (ref 8.9–10.3)
GFR calc non Af Amer: 44 mL/min — ABNORMAL LOW (ref >60)
GFR, EST AFRICAN AMERICAN: 51 mL/min — AB (ref >60)
Glucose, Bld: 324 mg/dL — ABNORMAL HIGH (ref 65–99)
POTASSIUM: 4.6 mmol/L (ref 3.5–5.1)
SODIUM: 134 mmol/L — AB (ref 135–145)

## 2015-01-17 MED ORDER — METHOCARBAMOL 500 MG PO TABS: 500.0000 mg | ORAL_TABLET | Freq: Three times a day (TID) | ORAL | Status: DC | PRN

## 2015-01-17 MED ORDER — TRESIBA FLEXTOUCH 200 UNIT/ML ~~LOC~~ SOPN: 50.0000 [IU] | PEN_INJECTOR | Freq: Two times a day (BID) | SUBCUTANEOUS | Status: DC

## 2015-01-17 MED ORDER — NYSTATIN 100000 UNIT/GM EX CREA: TOPICAL_CREAM | CUTANEOUS | Status: DC

## 2015-01-17 MED ORDER — TRESIBA FLEXTOUCH 200 UNIT/ML ~~LOC~~ SOPN: 60.0000 [IU] | PEN_INJECTOR | Freq: Every day | SUBCUTANEOUS | Status: DC

## 2015-01-17 NOTE — Telephone Encounter (Signed)
Pt needs tresiba called into the [facility] on [address]

## 2015-01-17 NOTE — Telephone Encounter (Signed)
rx sent

## 2015-01-17 NOTE — ED Notes (Signed)
Per pt, states high blood pressure and diabetes-having pain in both legs

## 2015-01-17 NOTE — ED Notes (Signed)
Pt verbalized understanding d/c instructions. 

## 2015-01-17 NOTE — ED Provider Notes (Signed)
CSN: [id number]     Arrival date & time 01/17/15  1124 History   First MD Initiated Contact with Patient 01/17/15 1245     Chief Complaint  Patient presents with  . Leg Pain  . DM/HTN      (Consider location/radiation/quality/duration/timing/severity/associated sxs/prior Treatment) The history is provided by the patient.     Patient with hx DM, CKD, HTN p/w bilateral leg pain that has been ongoing x 1 month.  The pain is described as a cramp or spasm that occurs when her pannus hits her in the legs.  She has lost 250 pounds with change in lifestyle and eating habits and now has large layer of thick fat and skin that hangs down onto her thighs.  This is the location of her pain.  Is seen at [facility] and states they tried to give her ibuprofen for pain but she is not allowed to take this due to CKD.  Has taken tylenol without relief.  Uses cane to walk around at baseline without problems, this is unchanged.  Denies fevers, abdominal pain, CP, SOB, weakness or numbness of the extremities.  Is taking all of her medications as directed but has been out of her long acting insulin x 3-4 days because it has not been called in yet by her PCP.    Past Medical History  Diagnosis Date  . Diabetes mellitus   . Hypertension   . Cellulitis   . Hyperlipidemia   . CKD (chronic kidney disease), stage III   . Neuropathy ([place])   . Chronic bronchitis [facility])    Past Surgical History  Procedure Laterality Date  . Tonsillectomy     Family History  Problem Relation Age of Onset  . Diabetes Mother   . Diabetes Father   . Diabetes Sister   . Hypertension Mother   . Hyperlipidemia Mother   . Heart disease Father    Social History  Substance Use Topics  . Smoking status: Current Every Day Smoker -- 0.10 packs/day for 23 years    Types: Cigarettes  . Smokeless tobacco: None  . Alcohol Use: Yes     Comment: Occasional use: 4 drinks per occasion, once a month   OB History    No data  available     Review of Systems  Respiratory: Positive for cough.   All other systems reviewed and are negative.     Allergies  Ibuprofen; Peach flavor; Penicillins; and Strawberry  Home Medications   Prior to Admission medications   Medication Sig Start Date End Date Taking? Authorizing Provider  acetaminophen (TYLENOL) 500 MG tablet Take 1,000 mg by mouth every 6 (six) hours as needed for moderate pain.   Yes Historical Provider, MD  albuterol (PROVENTIL HFA;VENTOLIN HFA) 108 (90 BASE) MCG/ACT inhaler Inhale 2 puffs into the lungs every 6 (six) hours as needed. For shortness of breath    Yes Historical Provider, MD  amLODipine (NORVASC) 5 MG tablet Take 5 mg by mouth daily.   Yes Historical Provider, MD  Canagliflozin-Metformin HCl (INVOKAMET) 50-1000 MG TABS Take 1 tablet by mouth 2 (two) times daily.   Yes Historical Provider, MD  gabapentin (NEURONTIN) 400 MG capsule Take 400 mg by mouth 3 (three) times daily.   Yes Historical Provider, MD  glucose blood (ONETOUCH VERIO) test strip Use as instructed to check blood sugar 2 times daily Dx code E11.9 11/03/14  Yes Elayne Snare, MD  insulin NPH-regular Human (NOVOLIN 70/30) (70-30) 100 UNIT/ML injection Inject  15-25 Units into the skin 3 (three) times daily. 15 units am-15 units lunch-25 units dinner   Yes Historical Provider, MD  NEXIUM 40 MG capsule Take 40 mg by mouth daily.  06/09/14  Yes Historical Provider, MD  rosuvastatin (CRESTOR) 40 MG tablet Take 40 mg by mouth daily.    Yes Historical Provider, MD  TRESIBA FLEXTOUCH 200 UNIT/ML SOPN Inject 50 Units into the skin 2 (two) times daily. Patient taking differently: Inject 60 Units into the skin at bedtime.  01/17/15  Yes Elayne Snare, MD  UNABLE TO FIND 1. Glucometer x 1. 2. Lancets x 1 box. 3. Glucometer strips x 1 box. 12/11/12  Yes Modena Jansky, MD  insulin aspart (NOVOLOG) 100 UNIT/ML injection Inject 6 Units into the skin 3 (three) times daily with meals. Patient not taking:  Reported on 01/17/2015 08/26/14   Elayne Snare, MD  Insulin Syringe-Needle U-100 (SAFETY-GLIDE 0.3CC SYR 29GX1/2) 29G X 1/2" 0.3 ML MISC Use as directed. Patient not taking: Reported on 01/17/2015 12/11/12   Modena Jansky, MD   BP 140/99 mmHg  Pulse 88  Temp(Src) 97.5 F (36.4 C) (Oral)  Resp 18  SpO2 100%  LMP 12/08/2012 Physical Exam  Constitutional: She appears well-developed and well-nourished. No distress.  HENT:  Head: Normocephalic and atraumatic.  Eyes: Conjunctivae are normal.  Neck: Neck supple.  Cardiovascular: Normal rate and regular rhythm.   Pulmonary/Chest: Effort normal and breath sounds normal. No respiratory distress. She has no wheezes. She has no rales.  Abdominal: Soft. She exhibits no distension. There is no tenderness. There is no rebound and no guarding.  Very large pannus - slick white film that is malodorous and slightly discolored (grey) skin under right side of pannus.    Neurological: She is alert.  Skin: She is not diaphoretic.  Nursing note and vitals reviewed.   ED Course  Procedures (including critical care time) Labs Review Labs Reviewed  BASIC METABOLIC PANEL - Abnormal; Notable for the following:    Sodium 134 (*)    Chloride 98 (*)    Glucose, Bld 324 (*)    Creatinine, Ser 1.41 (*)    GFR calc non Af Amer 44 (*)    GFR calc Af Amer 51 (*)    All other components within normal limits  CBC - Abnormal; Notable for the following:    RBC 5.97 (*)    MCV 75.7 (*)    MCH 24.6 (*)    All other components within normal limits  CBG MONITORING, ED - Abnormal; Notable for the following:    Glucose-Capillary 316 (*)    All other components within normal limits  URINALYSIS, ROUTINE W REFLEX MICROSCOPIC (NOT AT [facility])    Imaging Review No results found. I have personally reviewed and evaluated these images and lab results as part of my medical decision-making.   EKG Interpretation None      MDM   Final diagnoses:  Bilateral leg pain   Abdominal pannus  Yeast infection of the skin  Hyperglycemia    Afebrile, nontoxic patient with ongoing pain in her thighs due to the weight and positioning of large panniculus.  She does have mild yeasty skin infection under right side of pannus.  Otherwise unremarkable exam.  Labs significant for hyperglycemia likely due to patient being out of her long acting insulin.  D/C home with robaxin, nystatin cream, refill of insulin.  PCP follow up.  Discussed result, findings, treatment, and follow up  with patient.  Pt given return precautions.  Pt verbalizes understanding and agrees with plan.         Clayton Bibles, PA-C 01/17/15 1601  Orlie Dakin, MD 01/17/15 [phone]

## 2015-01-17 NOTE — ED Notes (Signed)
Pt aware Urine specimen is needed

## 2015-01-17 NOTE — Discharge Instructions (Signed)
Read the information below.  Use the prescribed medication as directed.  Please discuss all new medications with your pharmacist.  You may return to the Emergency Department at any time for worsening condition or any new symptoms that concern you.   If you develop uncontrolled pain, weakness or numbness of the extremity, severe discoloration of the skin, or you are unable to walk, return to the ER for a recheck.      Cutaneous Candidiasis Cutaneous candidiasis is a condition in which there is an overgrowth of yeast (candida) on the skin. Yeast normally live on the skin, but in small enough numbers not to cause any symptoms. In certain cases, increased growth of the yeast may cause an actual yeast infection. This kind of infection usually occurs in areas of the skin that are constantly warm and moist, such as the armpits or the groin. Yeast is the most common cause of diaper rash in babies and in people who cannot control their bowel movements (incontinence). CAUSES  The fungus that most often causes cutaneous candidiasis is Candida albicans. Conditions that can increase the risk of getting a yeast infection of the skin include:  Obesity.  Pregnancy.  Diabetes.  Taking antibiotic medicine.  Taking birth control pills.  Taking steroid medicines.  Thyroid disease.  An iron or zinc deficiency.  Problems with the immune system. SYMPTOMS   Red, swollen area of the skin.  Bumps on the skin.  Itchiness. DIAGNOSIS  The diagnosis of cutaneous candidiasis is usually based on its appearance. Light scrapings of the skin may also be taken and viewed under a microscope to identify the presence of yeast. TREATMENT  Antifungal creams may be applied to the infected skin. In severe cases, oral medicines may be needed.  HOME CARE INSTRUCTIONS   Keep your skin clean and dry.  Maintain a healthy weight.  If you have diabetes, keep your blood sugar under control. SEEK IMMEDIATE MEDICAL CARE  IF:  Your rash continues to spread despite treatment.  You have a fever, chills, or abdominal pain.   This information is not intended to replace advice given to you by your health care provider. Make sure you discuss any questions you have with your health care provider.   Document Released: 12/12/2010 Document Revised: 06/18/2011 Document Reviewed: 09/27/2014 Elsevier Interactive Patient Education [employer].

## 2015-01-31 ENCOUNTER — Telehealth: Payer: Self-pay | Admitting: *Deleted

## 2015-01-31 ENCOUNTER — Other Ambulatory Visit (INDEPENDENT_AMBULATORY_CARE_PROVIDER_SITE_OTHER): Payer: Medicare Other

## 2015-01-31 ENCOUNTER — Other Ambulatory Visit: Payer: Self-pay | Admitting: *Deleted

## 2015-01-31 DIAGNOSIS — E1165 Type 2 diabetes mellitus with hyperglycemia: Secondary | ICD-10-CM | POA: Diagnosis not present

## 2015-01-31 DIAGNOSIS — IMO0002 Reserved for concepts with insufficient information to code with codable children: Secondary | ICD-10-CM

## 2015-01-31 LAB — COMPREHENSIVE METABOLIC PANEL
ALT: 9 U/L (ref 0–35)
AST: 14 U/L (ref 0–37)
Albumin: 3.6 g/dL (ref 3.5–5.2)
Alkaline Phosphatase: 67 U/L (ref 39–117)
BUN: 17 mg/dL (ref 6–23)
CO2: 29 meq/L (ref 19–32)
Calcium: 9.4 mg/dL (ref 8.4–10.5)
Chloride: 103 mEq/L (ref 96–112)
Creatinine, Ser: 1.55 mg/dL — ABNORMAL HIGH (ref 0.40–1.20)
GFR: 46.1 mL/min — AB (ref >60.00)
GLUCOSE: 127 mg/dL — AB (ref 70–99)
POTASSIUM: 4.3 meq/L (ref 3.5–5.1)
Sodium: 139 mEq/L (ref 135–145)
Total Bilirubin: 0.3 mg/dL (ref 0.2–1.2)
Total Protein: 7.5 g/dL (ref 6.0–8.3)

## 2015-01-31 LAB — HEMOGLOBIN A1C: HEMOGLOBIN A1C: 9.3 % — AB (ref 4.6–6.5)

## 2015-01-31 MED ORDER — CYCLOBENZAPRINE HCL 5 MG PO TABS: ORAL_TABLET | ORAL | Status: DC

## 2015-01-31 NOTE — Telephone Encounter (Signed)
She has a PCP at [facility] clinic. Can give her Flexeril 5 mg bid prn #10 only.  Needs to bring meter to OV

## 2015-01-31 NOTE — Telephone Encounter (Signed)
Patient came in for her labs today, she was seen at the ER recently and was given methocarbamol for muscle spasms, she said it's very expensive and can't afford it, she is going to set up an appointment with a PCP, but wants to know if you could call her something in for this?

## 2015-01-31 NOTE — Telephone Encounter (Signed)
Noted, rx sent.

## 2015-02-03 ENCOUNTER — Encounter: Payer: Self-pay | Admitting: Endocrinology

## 2015-02-03 ENCOUNTER — Ambulatory Visit (INDEPENDENT_AMBULATORY_CARE_PROVIDER_SITE_OTHER): Payer: Medicare Other | Admitting: Endocrinology

## 2015-02-03 VITALS — BP 117/83 | HR 79 | Temp 98.4°F | Resp 16 | Ht 64.0 in | Wt >= 6400 oz

## 2015-02-03 DIAGNOSIS — E1122 Type 2 diabetes mellitus with diabetic chronic kidney disease: Secondary | ICD-10-CM

## 2015-02-03 DIAGNOSIS — Z23 Encounter for immunization: Secondary | ICD-10-CM

## 2015-02-03 DIAGNOSIS — E1165 Type 2 diabetes mellitus with hyperglycemia: Secondary | ICD-10-CM | POA: Diagnosis not present

## 2015-02-03 DIAGNOSIS — N182 Chronic kidney disease, stage 2 (mild): Secondary | ICD-10-CM

## 2015-02-03 DIAGNOSIS — Z794 Long term (current) use of insulin: Secondary | ICD-10-CM | POA: Diagnosis not present

## 2015-02-03 NOTE — Progress Notes (Signed)
Patient ID: Makayla [name], female   DOB: [birth year], 49 y.o.   MRN: [id number]           Reason for Appointment: Follow-up for Type 2 Diabetes  Referring physician: Ouida Sills  History of Present Illness:          Diagnosis: Type 2 diabetes mellitus, date of diagnosis:  1991      Past history: She believes her blood sugar level was relatively high at the time of diagnosis and she was started on insulin right away. She may have taken metformin and glipizide also at some point and these have been continued Her detailed treatment history is not available but she most likely had been on premixed insulin for some time She has had consistently poor control in the past with A1c ranging from 13-15 in 2012 and 2014 She was also admitted to the hospital in 9/14 for severe hyperglycemia Patient has had poor control of her diabetes prior to her consultation  Recent history:   INSULIN regimen is described as: [place] 50 twice a day, NovoLog 15 units before breakfast and lunch and 25 before supper    She had been on a regimen of Lantus insulin, 70/30 insulin and Invokamet on initial consultation. She is on a regimen of Novolog and Tyler Aas more recently She has been continued on Invokamet once a day since renal function is somewhat impaired; She thinks she is taking this  Despite reminders she is checking her blood sugars somewhat erratically and mostly in the morning hours She had readings over 300 and also 400 about 2-3 weeks ago and she does not know why.  She did have a trip to the ER with leg pain and glucose was over 300  Her A1c is consistently high and relatively higher this time  Current blood sugar patterns and problems identified:  Her last 2 blood sugars this week before noon or improved, previously morning readings were frequently over 200  She had readings over 400 about 1 PM twice and she claimed that this is from putting sugar in her coffee  She is still eating cereal in the morning  and no protein sometimes  She checks sugars in the evenings only once and blood sugars were around 300 all day  In June she was seen by nurse educator but not clear if she has understood her instructions for meal planning and monitoring  She has gained a large amount of weight since her last visit  Oral hypoglycemic drugs the patient is taking are: None Side effects from medications have been: None  Compliance with the medical regimen: Recently improved    Glucose monitoring:       Glucometer:  One Touch Verio Blood Glucose readings  results and analysis from download:  Mean values apply above for all meters except median for One Touch  PRE-MEAL Fasting  12-1 PM  Dinner Bedtime Overall  Glucose range:  119-284  148, 411, 467   319     Mean/median:      253       Self-care: The diet that the patient has been following is: tries to limit carbs and high fat meals, no regular soft drinks       Meals: 3 meals per day. Breakfast is cereal or sausage and toast.  Has meat for lunch and dinner, not too many snacks Dinner at 8 pm           Exercise: not walking  some gardening, [facility]       Dietician visit, most recent:none CDE 6/16.                Weight history:  Wt Readings from Last 3 Encounters:  02/03/15 403 lb (182.8 kg)  10/05/14 378 lb 3.2 oz (171.55 kg)  09/20/14 390 lb (176.903 kg)    Glycemic control:   Lab Results  Component Value Date   HGBA1C 9.3* 01/31/2015   HGBA1C 8.6* 10/27/2014   HGBA1C 11.0* 07/28/2014   Lab Results  Component Value Date   MICROALBUR <0.7 07/28/2014   LDLCALC 84 08/13/2014   CREATININE 1.55* 01/31/2015         Medication List       This list is accurate as of: 02/03/15  2:56 PM.  Always use your most recent med list.               acetaminophen 500 MG tablet  Commonly known as:  TYLENOL  Take 1,000 mg by mouth every 6 (six) hours as needed for moderate pain.      albuterol 108 (90 BASE) MCG/ACT inhaler  Commonly known as:  PROVENTIL HFA;VENTOLIN HFA  Inhale 2 puffs into the lungs every 6 (six) hours as needed. For shortness of breath     amLODipine 5 MG tablet  Commonly known as:  NORVASC  Take 5 mg by mouth daily.     cyclobenzaprine 5 MG tablet  Commonly known as:  FLEXERIL  Take 1 tablet twice a day as needed for Muscle spasms.     gabapentin 400 MG capsule  Commonly known as:  NEURONTIN  Take 400 mg by mouth 3 (three) times daily.     glucose blood test strip  Commonly known as:  ONETOUCH VERIO  Use as instructed to check blood sugar 2 times daily Dx code E11.9     insulin aspart 100 UNIT/ML injection  Commonly known as:  novoLOG  Inject 6 Units into the skin 3 (three) times daily with meals.     Insulin Syringe-Needle U-100 29G X 1/2" 0.3 ML Misc  Commonly known as:  SAFETY-GLIDE 0.3CC SYR 29GX1/2  Use as directed.     INVOKAMET 50-1000 MG Tabs  Generic drug:  Canagliflozin-Metformin HCl  Take 1 tablet by mouth 2 (two) times daily.     methocarbamol 500 MG tablet  Commonly known as:  ROBAXIN  Take 1 tablet (500 mg total) by mouth every 8 (eight) hours as needed for muscle spasms (and pain).     NEXIUM 40 MG capsule  Generic drug:  esomeprazole  Take 40 mg by mouth daily.     nystatin cream  Commonly known as:  MYCOSTATIN  Apply to affected area 2 times daily     rosuvastatin 40 MG tablet  Commonly known as:  CRESTOR  Take 40 mg by mouth daily.     TRESIBA FLEXTOUCH 200 UNIT/ML Sopn  Generic drug:  Insulin Degludec  Inject 60 Units into the skin at bedtime.     UNABLE TO FIND  1. Glucometer x 1. 2. Lancets x 1 box. 3. Glucometer strips x 1 box.        Allergies:  Allergies  Allergen Reactions  . Ibuprofen Other (See Comments)    Can not take due to kidney problems.   Marland Kitchen Peach Flavor Swelling    Peaches.   . Penicillins     Inflates bronchitis.  Has patient had a PCN reaction causing  immediate rash,  facial/tongue/throat swelling, SOB or lightheadedness with hypotension: Yes- shortness of breathe.  Has patient had a PCN reaction causing severe rash involving mucus membranes or skin necrosis: No Has patient had a PCN reaction that required hospitalization No Has patient had a PCN reaction occurring within the last 10 years: No If all of the above answers are "NO", then may proceed with Cephalosporin use.   . Strawberry Extract Swelling    Past Medical History  Diagnosis Date  . Diabetes mellitus   . Hypertension   . Cellulitis   . Hyperlipidemia   . CKD (chronic kidney disease), stage III   . Neuropathy ([place])   . Chronic bronchitis [facility])     Past Surgical History  Procedure Laterality Date  . Tonsillectomy      Family History  Problem Relation Age of Onset  . Diabetes Mother   . Diabetes Father   . Diabetes Sister   . Hypertension Mother   . Hyperlipidemia Mother   . Heart disease Father     Social History:  reports that she has been smoking Cigarettes.  She has a 2.3 pack-year smoking history. She does not have any smokeless tobacco history on file. She reports that she drinks alcohol. She reports that she does not use illicit drugs.    Review of Systems    Lipid history:  taking Crestor and WelChol  for treatment       Lab Results  Component Value Date   CHOL 169 08/13/2014   HDL 52.60 08/13/2014   LDLCALC 84 08/13/2014   TRIG 161.0* 08/13/2014   CHOLHDL 3 08/13/2014           Hypertension: Present for years.  This is managed by PCP and appears controlled  Neurological: no headaches.  Has had some numbness but no burning, pains or tingling in feet.   Occasionally will have throbbing in her legs and feet and will take gabapentin for this  Having more problems with low back pain and radiating leg pain  CKD: Creatinine is still  high.  Etiology unclear   LABS:  Appointment on 01/31/2015  Component Date Value Ref Range Status  . Hgb A1c MFr Bld  01/31/2015 9.3* 4.6 - 6.5 % Final   Glycemic Control Guidelines for People with Diabetes:Non Diabetic:  <6%Goal of Therapy: <7%Additional Action Suggested:  >8%   . Sodium 01/31/2015 139  135 - 145 mEq/L Final  . Potassium 01/31/2015 4.3  3.5 - 5.1 mEq/L Final  . Chloride 01/31/2015 103  96 - 112 mEq/L Final  . CO2 01/31/2015 29  19 - 32 mEq/L Final  . Glucose, Bld 01/31/2015 127* 70 - 99 mg/dL Final  . BUN 01/31/2015 17  6 - 23 mg/dL Final  . Creatinine, Ser 01/31/2015 1.55* 0.40 - 1.20 mg/dL Final  . Total Bilirubin 01/31/2015 0.3  0.2 - 1.2 mg/dL Final  . Alkaline Phosphatase 01/31/2015 67  39 - 117 U/L Final  . AST 01/31/2015 14  0 - 37 U/L Final  . ALT 01/31/2015 9  0 - 35 U/L Final  . Total Protein 01/31/2015 7.5  6.0 - 8.3 g/dL Final  . Albumin 01/31/2015 3.6  3.5 - 5.2 g/dL Final  . Calcium 01/31/2015 9.4  8.4 - 10.5 mg/dL Final  . GFR 01/31/2015 46.10* >60.00 mL/min Final    Physical Examination:  BP 117/83 mmHg  Pulse 79  Temp(Src) 98.4 F (36.9 C)  Resp 16  Ht 5\' 4"  (1.626 m)  Wt  403 lb (182.8 kg)  BMI 69.14 kg/m2  SpO2 95%  LMP 12/08/2012      ASSESSMENT:  Diabetes type 2, uncontrolled with massive obesity Her blood sugars are still poorly controlled and difficult to assess since she is checking blood sugars primarily in the morning hours See history of present illness for detailed discussion of his current management, blood sugar patterns and problems identified She is still quite insulin resistant. Currently taking about 100 units of basal insulin but relatively low amounts of mealtime coverage Appears to have gained a significant amount of weight since her visit in July  Still has poor understanding of day-to-day management and not compliant with monitoring, likely to be noncompliant with diet She has several very high readings and unclear whether this is related to noncompliance with insulin A1c has gone up over 9% This is despite recent improvement in  fasting blood sugars although these are not consistent either; difficult to know how much insulin to prescribe for her meals since she used to does not check readings after meals  CHRONIC kidney disease: Etiology unclear, no change in creatinine recently  Preventive care: She was counseled about the need and safety of influenza vaccine and that it does not cause infections, she agrees to take this today   PLAN:   She will start checking her blood sugar as directed including  after meals consistently  Again needs to go back to nurse educator for more detailed education    she will increase her mealtime coverage all across her meals especially her breakfast and lunch  Advised her to check some readings after supper even if she needs to start eating earlier in the evening  Encouraged to start exercising when she can  She may be a candidate for starting Victoza, this may be an issue with insurance coverage or out-of-pocket expense   Patient Instructions  [place] 95 in am only  NOVOLOG 20 at bfst, 25 at lunch and 30 at supper  Check blood sugars on waking up 3 times a week Also check blood sugars about 2 hours after a meal and do this after different meals by rotation  Recommended blood sugar levels on waking up is 90-130 and about 2 hours after meal is 130-160  Please bring your blood sugar monitor to each visit, thank you  Stay on Invokamet in am only    Counseling time on subjects discussed above is over 50% of today's 25 minute visit  Jaeger Trueheart 02/03/2015, 2:56 PM   Note: This office note was prepared with Dragon voice recognition system technology. Any transcriptional errors that result from this process are unintentional.

## 2015-02-03 NOTE — Patient Instructions (Signed)
[place] 95 in am only  NOVOLOG 20 at bfst, 25 at lunch and 30 at supper  Check blood sugars on waking up 3 times a week Also check blood sugars about 2 hours after a meal and do this after different meals by rotation  Recommended blood sugar levels on waking up is 90-130 and about 2 hours after meal is 130-160  Please bring your blood sugar monitor to each visit, thank you  Stay on Invokamet in am only

## 2015-02-22 ENCOUNTER — Encounter: Payer: Medicare Other | Admitting: Nutrition

## 2015-03-07 ENCOUNTER — Encounter: Payer: Medicare Other | Admitting: Nutrition

## 2015-03-09 ENCOUNTER — Telehealth: Payer: Self-pay | Admitting: *Deleted

## 2015-03-09 ENCOUNTER — Other Ambulatory Visit: Payer: Self-pay | Admitting: Endocrinology

## 2015-03-09 NOTE — Telephone Encounter (Signed)
Patient is requesting a refill of Flexeril, I think you filled it just once? Please advise if okay to refill, or call PCP

## 2015-03-09 NOTE — Telephone Encounter (Signed)
She needs to be established with her PCP for this

## 2015-04-01 ENCOUNTER — Other Ambulatory Visit: Payer: Medicare Other

## 2015-04-06 ENCOUNTER — Telehealth: Payer: Self-pay | Admitting: Endocrinology

## 2015-04-06 ENCOUNTER — Ambulatory Visit: Payer: Medicare Other | Admitting: Endocrinology

## 2015-04-06 NOTE — Telephone Encounter (Signed)
Schedule follow-up appointment, first available

## 2015-04-06 NOTE — Telephone Encounter (Signed)
Patient no showed today's appt. Please advise on how to follow up. °A. No follow up necessary. °B. Follow up urgent. Contact patient immediately. °C. Follow up necessary. Contact patient and schedule visit in ___ days. °D. Follow up advised. Contact patient and schedule visit in ____weeks. ° °

## 2015-04-07 NOTE — Telephone Encounter (Signed)
Rescheduled at the earliest convenient time

## 2015-04-13 NOTE — Telephone Encounter (Signed)
Please call to reschedule.

## 2015-04-14 NOTE — Telephone Encounter (Signed)
Patient scheduled.

## 2015-05-02 ENCOUNTER — Ambulatory Visit (INDEPENDENT_AMBULATORY_CARE_PROVIDER_SITE_OTHER): Payer: Medicare Other | Admitting: Endocrinology

## 2015-05-02 ENCOUNTER — Encounter: Payer: Self-pay | Admitting: Endocrinology

## 2015-05-02 VITALS — BP 126/84 | HR 106 | Temp 98.5°F | Resp 16 | Ht 64.0 in | Wt 386.0 lb

## 2015-05-02 DIAGNOSIS — E038 Other specified hypothyroidism: Secondary | ICD-10-CM | POA: Diagnosis not present

## 2015-05-02 DIAGNOSIS — E063 Autoimmune thyroiditis: Secondary | ICD-10-CM

## 2015-05-02 DIAGNOSIS — Z794 Long term (current) use of insulin: Secondary | ICD-10-CM

## 2015-05-02 DIAGNOSIS — E119 Type 2 diabetes mellitus without complications: Secondary | ICD-10-CM

## 2015-05-02 DIAGNOSIS — E1165 Type 2 diabetes mellitus with hyperglycemia: Secondary | ICD-10-CM

## 2015-05-02 LAB — POCT GLYCOSYLATED HEMOGLOBIN (HGB A1C): Hemoglobin A1C: 7.6

## 2015-05-02 LAB — BASIC METABOLIC PANEL
BUN: 11 mg/dL (ref 6–23)
CHLORIDE: 100 meq/L (ref 96–112)
CO2: 27 meq/L (ref 19–32)
CREATININE: 1.7 mg/dL — AB (ref 0.40–1.20)
Calcium: 9.3 mg/dL (ref 8.4–10.5)
GFR: 41.39 mL/min — ABNORMAL LOW (ref >60.00)
GLUCOSE: 133 mg/dL — AB (ref 70–99)
POTASSIUM: 4.2 meq/L (ref 3.5–5.1)
Sodium: 135 mEq/L (ref 135–145)

## 2015-05-02 LAB — T4, FREE: Free T4: 1.07 ng/dL (ref 0.60–1.60)

## 2015-05-02 LAB — TSH: TSH: 1.14 u[IU]/mL (ref 0.35–4.50)

## 2015-05-02 NOTE — Patient Instructions (Addendum)
Reduce Novolog to 12 if eating less at lunch and less Carbs  Check blood sugars on waking up   times a week Also check blood sugars about 2 hours after a meal and do this after different meals by rotation  Recommended blood sugar levels on waking up is 90-130 and about 2 hours after meal is 130-160  Please bring your blood sugar monitor to each visit, thank you

## 2015-05-02 NOTE — Progress Notes (Signed)
Patient ID: Makayla [name], female   DOB: 1966-01-01, 50 y.o.   MRN: [id number]           Reason for Appointment: Follow-up for Type 2 Diabetes  Referring physician: Ouida Sills  History of Present Illness:          Diagnosis: Type 2 diabetes mellitus, date of diagnosis:  1991      Past history: She believes her blood sugar level was relatively high at the time of diagnosis and she was started on insulin right away. She may have taken metformin and glipizide also at some point and these have been continued Her detailed treatment history is not available but she most likely had been on premixed insulin for some time She has had consistently poor control in the past with A1c ranging from 13-15 in 2012 and 2014 She was also admitted to the hospital in 9/14 for severe hyperglycemia Patient has had poor control of her diabetes prior to her consultation She had been on a regimen of Lantus insulin, 70/30 insulin and Invokamet  Recent history:   INSULIN regimen is described as: [place] 52 twice a day, NovoLog 15 units before breakfast and lunch and 25 before supper    She is on a regimen of Novolog and [place] She has been continued on Invokamet once a day only since renal function is somewhat impaired  Despite reminders she is checking her blood sugars infrequently and does not bring her monitor for download consistently  Her A1c previously had been consistently high but now has come down below 8%   Current blood sugar patterns and problems identified:  Her blood sugars are reportedly near normal in the morning and before supper  She says that she will sometimes go off her diet and that sugars will be higher  Also has not been restricting drinks which sugar recently consistently  Previously had gained weight and is more recently finding that her appetite is reduced and she is watching portions  No recent labs available to assess her level of control except A1c  Oral hypoglycemic drugs  the patient is taking are: None Side effects from medications have been: None  Compliance with the medical regimen: Recently improved    Glucose monitoring:       Glucometer:  One Touch Verio Blood Glucose readings  results and analysis from recall:   PRE-MEAL Fasting Lunch Dinner Bedtime Overall  Glucose range: 95  83-180    Mean/median:          Self-care: The diet that the patient has been following is: tries to limit carbs and high fat meals, no regular soft drinks       Meals: 3 meals per day. Breakfast is cereal or sausage and toast.  Has meat for lunch and dinner, not too many snacks Dinner at 8 pm           Exercise: not walking  some gardening, [facility]       Dietician visit, most recent:none CDE 6/16.                Weight history:  Wt Readings from Last 3 Encounters:  05/02/15 386 lb (175.088 kg)  02/03/15 403 lb (182.8 kg)  10/05/14 378 lb 3.2 oz (171.55 kg)    Glycemic control:   Lab Results  Component Value Date   HGBA1C 7.6 05/02/2015   HGBA1C 9.3* 01/31/2015   HGBA1C 8.6* 10/27/2014   Lab Results  Component Value Date   MICROALBUR <0.7 07/28/2014   LDLCALC 84 08/13/2014   CREATININE 1.55* 01/31/2015         Medication List       This list is accurate as of: 05/02/15  4:14 PM.  Always use your most recent med list.               acetaminophen 500 MG tablet  Commonly known as:  TYLENOL  Take 1,000 mg by mouth every 6 (six) hours as needed for moderate pain.     albuterol 108 (90 Base) MCG/ACT inhaler  Commonly known as:  PROVENTIL HFA;VENTOLIN HFA  Inhale 2 puffs into the lungs every 6 (six) hours as needed. For shortness of breath     amLODipine 5 MG tablet  Commonly known as:  NORVASC  Take 5 mg by mouth daily.     B-D UF III MINI PEN NEEDLES 31G X 5 MM Misc  Generic drug:  Insulin Pen Needle  See admin instructions.     cyclobenzaprine 5 MG tablet  Commonly known as:  FLEXERIL    Take 1 tablet twice a day as needed for Muscle spasms.     cyclobenzaprine 10 MG tablet  Commonly known as:  FLEXERIL  take 1 tablet by mouth NIGHTLY AS NEEDED FOR MUSCLE SPASM     gabapentin 400 MG capsule  Commonly known as:  NEURONTIN  Take 400 mg by mouth 3 (three) times daily.     glucose blood test strip  Commonly known as:  ONETOUCH VERIO  Use as instructed to check blood sugar 2 times daily Dx code E11.9     insulin aspart 100 UNIT/ML injection  Commonly known as:  novoLOG  Inject 6 Units into the skin 3 (three) times daily with meals.     Insulin Syringe-Needle U-100 29G X 1/2" 0.3 ML Misc  Commonly known as:  SAFETY-GLIDE 0.3CC SYR 29GX1/2  Use as directed.     INVOKAMET 4034999755 MG Tabs  Generic drug:  Canagliflozin-Metformin HCl  Take 1 tablet by mouth daily.     levothyroxine 50 MCG tablet  Commonly known as:  SYNTHROID, LEVOTHROID  Take 50 mcg by mouth daily.     methocarbamol 500 MG tablet  Commonly known as:  ROBAXIN  Take 1 tablet (500 mg total) by mouth every 8 (eight) hours as needed for muscle spasms (and pain).     NEXIUM 40 MG capsule  Generic drug:  esomeprazole  Take 40 mg by mouth daily.     NOVOLIN 70/30 (70-30) 100 UNIT/ML injection  Generic drug:  insulin NPH-regular Human  inject 20 units subcutaneously three times a day BEFORE EACH MEAL     nystatin cream  Commonly known as:  MYCOSTATIN  Apply to affected area 2 times daily     rosuvastatin 40 MG tablet  Commonly known as:  CRESTOR  Take 40 mg by mouth daily.     TRESIBA FLEXTOUCH 200 UNIT/ML Sopn  Generic drug:  Insulin Degludec  Inject 60 Units into the skin at bedtime.     UNABLE TO FIND  1. Glucometer x 1. 2. Lancets x 1 box. 3. Glucometer strips x 1 box.        Allergies:  Allergies  Allergen Reactions  . Ibuprofen Other (See Comments)    Can not take due to kidney problems.   Marland Kitchen Peach Flavor Swelling    Peaches.   . Penicillins     Inflates bronchitis.  Has  patient had a PCN reaction causing immediate rash, facial/tongue/throat swelling, SOB or lightheadedness with hypotension: Yes- shortness of breathe.  Has patient had a PCN reaction causing severe rash involving mucus membranes or skin necrosis: No Has patient had a PCN reaction that required hospitalization No Has patient had a PCN reaction occurring within the last 10 years: No If all of the above answers are "NO", then may proceed with Cephalosporin use.   . Strawberry Extract Swelling    Past Medical History  Diagnosis Date  . Diabetes mellitus   . Hypertension   . Cellulitis   . Hyperlipidemia   . CKD (chronic kidney disease), stage III   . Neuropathy ([place])   . Chronic bronchitis [facility])     Past Surgical History  Procedure Laterality Date  . Tonsillectomy      Family History  Problem Relation Age of Onset  . Diabetes Mother   . Diabetes Father   . Diabetes Sister   . Hypertension Mother   . Hyperlipidemia Mother   . Heart disease Father     Social History:  reports that she has been smoking Cigarettes.  She has a 2.3 pack-year smoking history. She does not have any smokeless tobacco history on file. She reports that she drinks alcohol. She reports that she does not use illicit drugs.    Review of Systems    Lipid history:  taking Crestor and WelChol  for treatment       Lab Results  Component Value Date   CHOL 169 08/13/2014   HDL 52.60 08/13/2014   LDLCALC 84 08/13/2014   TRIG 161.0* 08/13/2014   CHOLHDL 3 08/13/2014           Hypertension: Present for years.  This is managed by PCP  Neurological: .   Has had some numbness but no burning, pains or tingling in feet.   Occasionally will have throbbing in her legs and feet and will take gabapentin for this   CKD: Creatinine is persistently mildly  high.  Etiology unclear   LABS:  Office Visit on 05/02/2015  Component Date Value Ref Range Status  . Hemoglobin A1C 05/02/2015 7.6   Final    Physical  Examination:  BP 126/84 mmHg  Pulse 106  Temp(Src) 98.5 F (36.9 C)  Resp 16  Ht 5\' 4"  (1.626 m)  Wt 386 lb (175.088 kg)  BMI 66.22 kg/m2  SpO2 93%  LMP 12/08/2012  Heartrate 100, regular    ASSESSMENT:  Diabetes type 2, uncontrolled with massive obesity See history of present illness for detailed discussion of his current management, blood sugar patterns and problems identified  Her blood sugars are appearing to be better with A1c below 8% now compared to her previous persistently high readings However since she did not bring her monitor not clear how often she is checking and what her blood sugars are at various times Although she is watching her portion she is not able to consistently control her meal content and drinks which sugar  Mild sinus tachycardia: Etiology unclear, asymptomatic   PLAN:   She will need to be checking her blood sugar after meals more often  No change in insulin as yet  To reduce lunchtime dose especially if eating smaller amount of carbohydrate  Consistently avoid drinks which sugar  Reminded her to call if blood sugars start getting low  Check thyroid levels for tachycardia  Patient Instructions  Reduce Novolog to 12 if eating less at lunch and less Carbs  Check blood sugars on waking up   times a week Also check blood sugars about 2 hours after a meal and do this after different meals by rotation  Recommended blood sugar levels on waking up is 90-130 and about 2 hours after meal is 130-160  Please bring your blood sugar monitor to each visit, thank you      [facility] 05/02/2015, 4:14 PM   Note: This office note was prepared with Dragon voice recognition system technology. Any transcriptional errors that result from this process are unintentional.

## 2015-05-03 NOTE — Progress Notes (Signed)
Quick Note:  Please let patient know that kidney test is worse, change Invokamet to Metformin XR 750mg , 2 daily  ______

## 2015-05-06 ENCOUNTER — Other Ambulatory Visit: Payer: Self-pay | Admitting: *Deleted

## 2015-05-06 MED ORDER — METFORMIN HCL ER 750 MG PO TB24: ORAL_TABLET | ORAL | Status: DC

## 2015-08-01 ENCOUNTER — Other Ambulatory Visit: Payer: Medicare Other

## 2015-08-05 ENCOUNTER — Ambulatory Visit: Payer: Medicare Other | Admitting: Endocrinology

## 2015-08-09 ENCOUNTER — Ambulatory Visit: Payer: Medicare Other | Admitting: Endocrinology

## 2015-08-09 ENCOUNTER — Encounter: Payer: Self-pay | Admitting: *Deleted

## 2015-08-09 DIAGNOSIS — Z0289 Encounter for other administrative examinations: Secondary | ICD-10-CM

## 2015-08-26 ENCOUNTER — Encounter: Payer: Self-pay | Admitting: Endocrinology

## 2015-08-29 ENCOUNTER — Telehealth: Payer: Self-pay | Admitting: Endocrinology

## 2015-08-29 NOTE — Telephone Encounter (Signed)
Patient dismissed from [facility] by Elayne Snare MD , effective Aug 26, 2015. Dismissal letter sent out by certified / registered mail. DA[facility]  Received signed domestic return receipt verifying delivery of certified letter on Aug 31, 2015. Article number [id number] [place] [facility]

## 2016-01-30 ENCOUNTER — Emergency Department (HOSPITAL_COMMUNITY)
Admission: EM | Admit: 2016-01-30 | Discharge: 2016-01-30 | Disposition: A | Discharge status: Home / Self Care | Payer: Medicare Other | Attending: Emergency Medicine | Admitting: Emergency Medicine

## 2016-01-30 ENCOUNTER — Encounter (HOSPITAL_COMMUNITY): Payer: Self-pay | Admitting: Emergency Medicine

## 2016-01-30 ENCOUNTER — Emergency Department (HOSPITAL_COMMUNITY): Payer: Medicare Other

## 2016-01-30 DIAGNOSIS — E1140 Type 2 diabetes mellitus with diabetic neuropathy, unspecified: Secondary | ICD-10-CM | POA: Insufficient documentation

## 2016-01-30 DIAGNOSIS — I129 Hypertensive chronic kidney disease with stage 1 through stage 4 chronic kidney disease, or unspecified chronic kidney disease: Secondary | ICD-10-CM | POA: Insufficient documentation

## 2016-01-30 DIAGNOSIS — Z79899 Other long term (current) drug therapy: Secondary | ICD-10-CM | POA: Diagnosis not present

## 2016-01-30 DIAGNOSIS — N183 Chronic kidney disease, stage 3 (moderate): Secondary | ICD-10-CM | POA: Insufficient documentation

## 2016-01-30 DIAGNOSIS — E1122 Type 2 diabetes mellitus with diabetic chronic kidney disease: Secondary | ICD-10-CM | POA: Insufficient documentation

## 2016-01-30 DIAGNOSIS — Z7984 Long term (current) use of oral hypoglycemic drugs: Secondary | ICD-10-CM | POA: Insufficient documentation

## 2016-01-30 DIAGNOSIS — M79672 Pain in left foot: Secondary | ICD-10-CM | POA: Insufficient documentation

## 2016-01-30 DIAGNOSIS — Z794 Long term (current) use of insulin: Secondary | ICD-10-CM | POA: Diagnosis not present

## 2016-01-30 DIAGNOSIS — F17210 Nicotine dependence, cigarettes, uncomplicated: Secondary | ICD-10-CM | POA: Diagnosis not present

## 2016-01-30 LAB — CBG MONITORING, ED: Glucose-Capillary: 93 mg/dL (ref 65–99)

## 2016-01-30 IMAGING — CR DG FOOT COMPLETE 3+V*L*
3 series · 3 of 3 positions shown · non-contrast
Comparison: [DATE].

CLINICAL DATA: Foot pain.  Fall.

EXAM:
LEFT FOOT - COMPLETE 3+ VIEW

[x foot ap left]
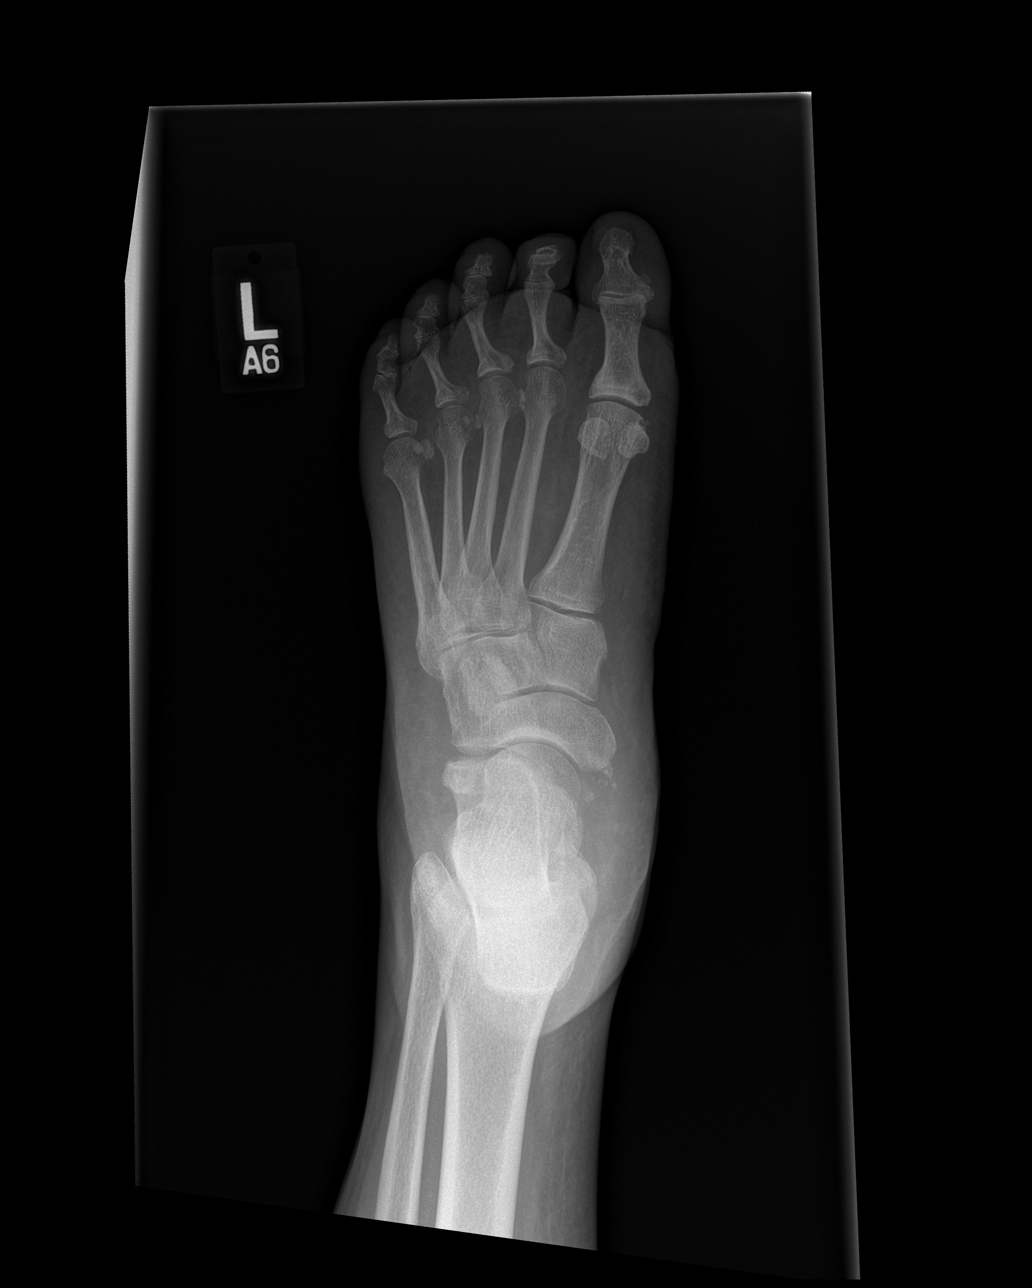

[x foot lat left]
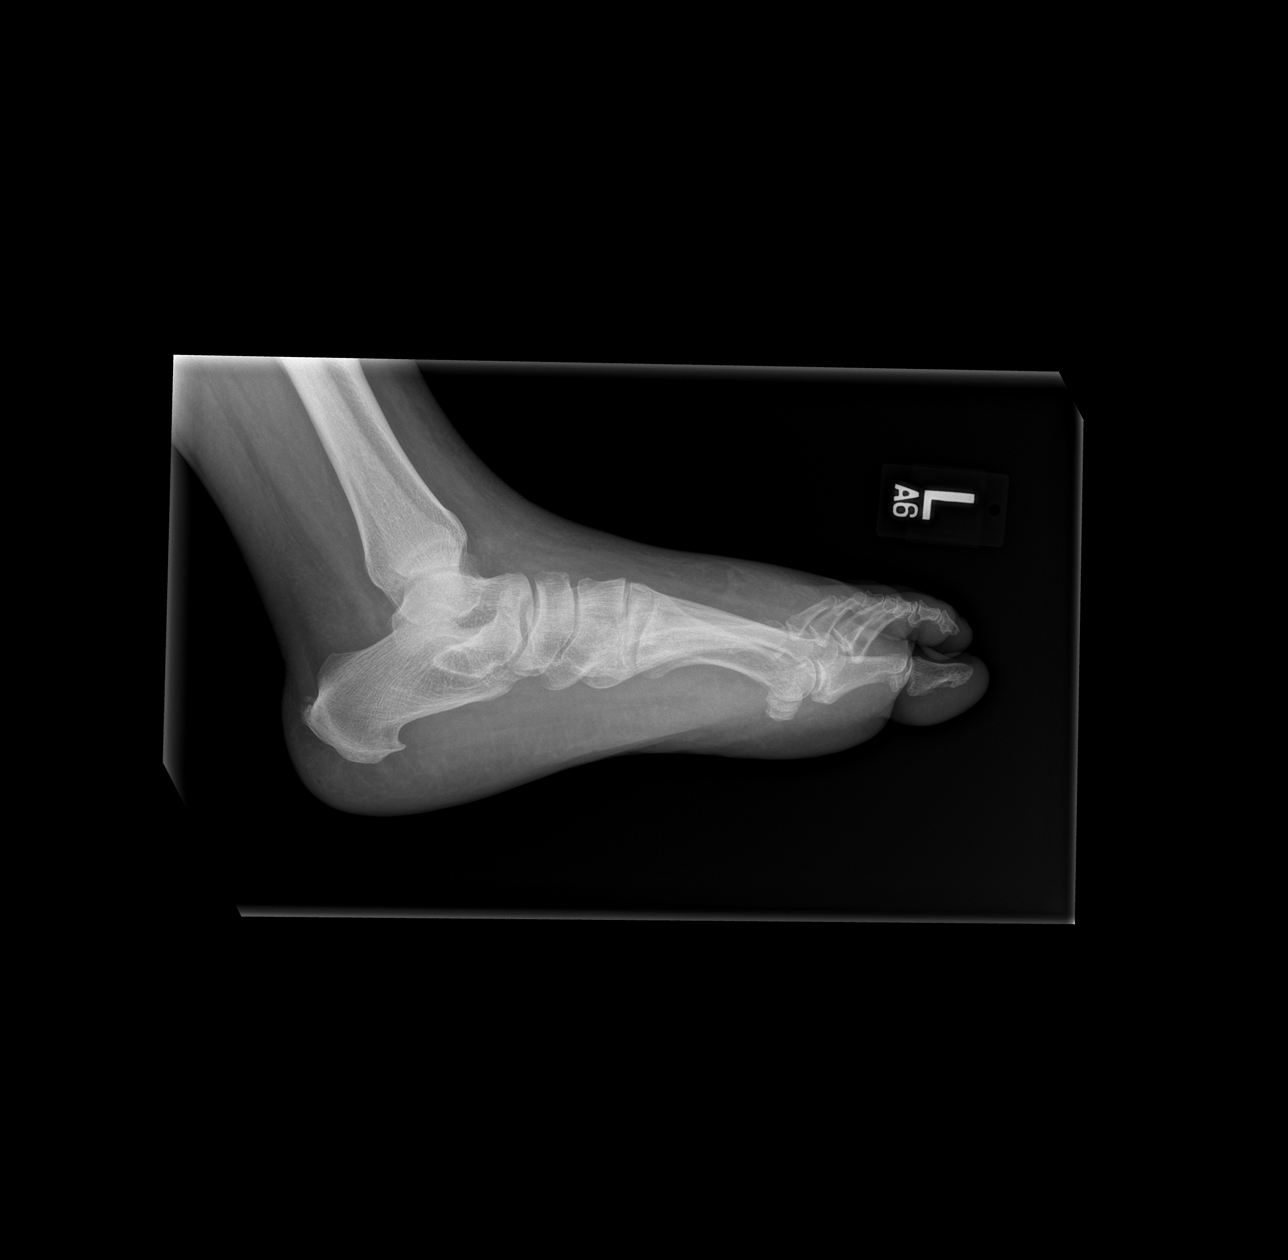

[x foot obl left]
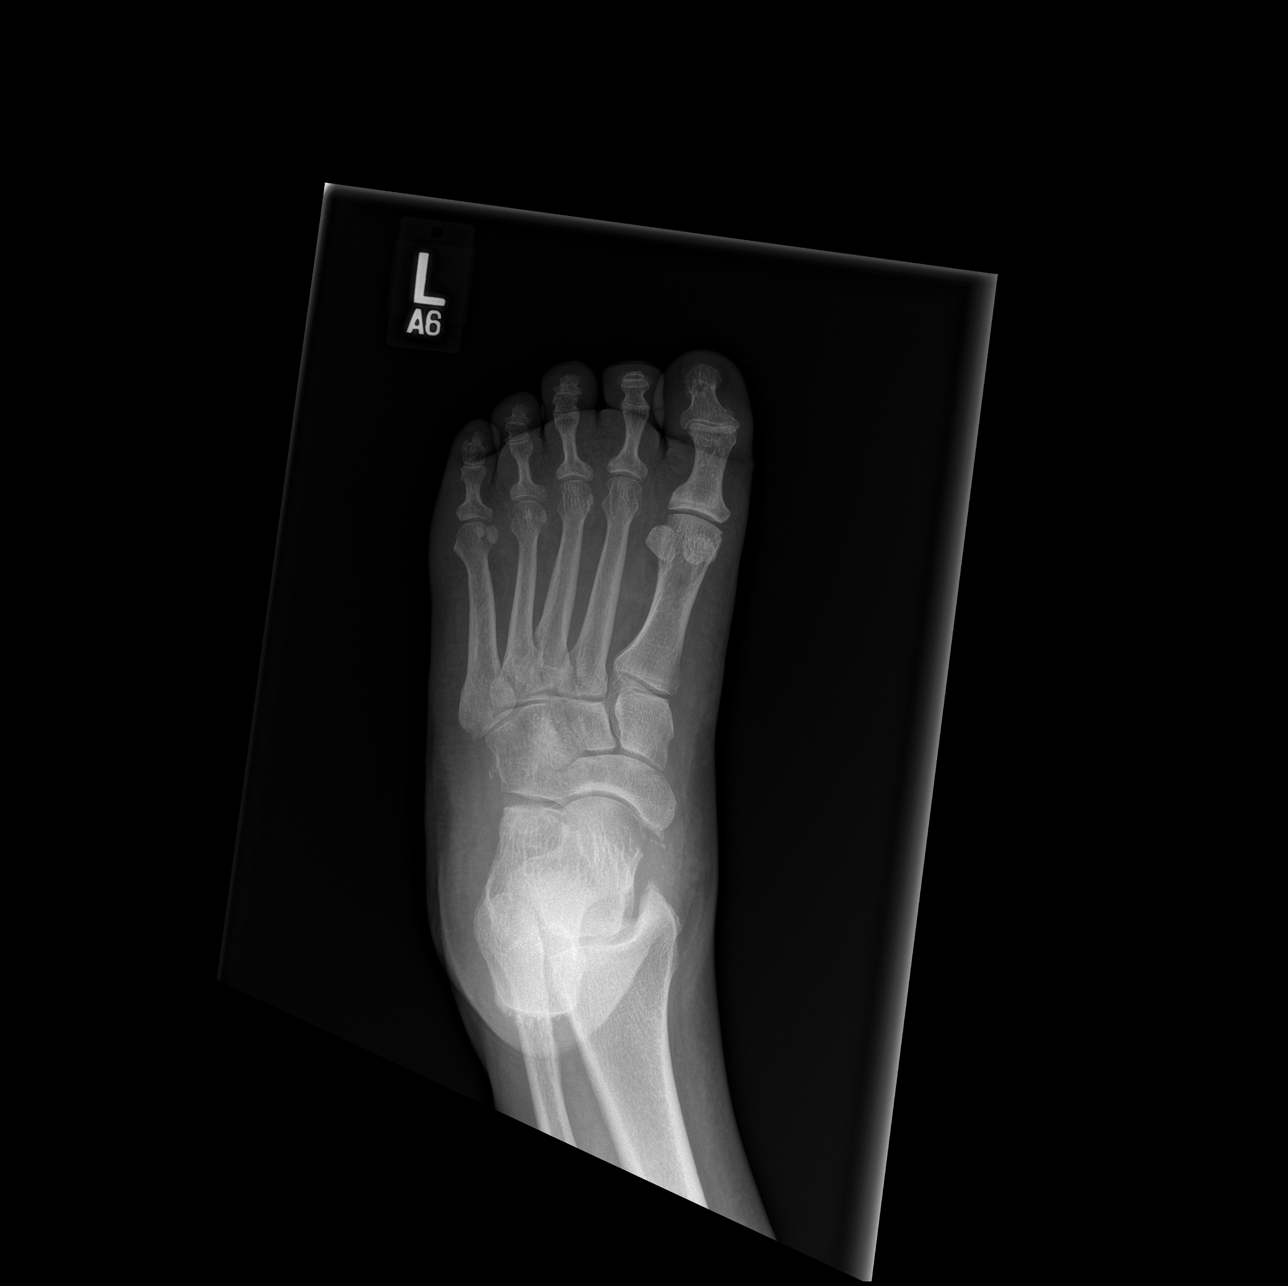

[3 of 3 positions shown; findings below may reference images not displayed]

FINDINGS: Diffuse degenerative change. No evidence of fracture or dislocation.
Diffuse soft tissue swelling.
IMPRESSION: Diffuse soft tissue swelling.  No acute bony abnormality .

## 2016-01-30 MED ORDER — DICLOFENAC SODIUM 1 % TD GEL: 2.0000 g | Freq: Four times a day (QID) | TRANSDERMAL | 0 refills | Status: DC

## 2016-01-30 NOTE — ED Provider Notes (Signed)
[facility] DEPT Provider Note   CSN: [id number] Arrival date & time: 01/30/16  2355     History   Chief Complaint Chief Complaint  Patient presents with  . Foot Pain    HPI Makayla [name] is a 50 y.o. female.  The history is provided by the patient.  Foot Pain  This is a new problem. Episode onset: 2 weeks ago. The problem occurs constantly. The problem has been gradually improving. Associated symptoms comments: Swelling. The symptoms are aggravated by walking. Nothing relieves the symptoms. She has tried a cold compress for the symptoms. The treatment provided no relief.    Past Medical History:  Diagnosis Date  . Cellulitis   . Chronic bronchitis ([place])   . CKD (chronic kidney disease), stage III   . Diabetes mellitus   . Hyperlipidemia   . Hypertension   . Neuropathy [facility])     Patient Active Problem List   Diagnosis Date Noted  . Renal failure, acute on chronic (HCC) 12/10/2012  . Hyperosmolar non-ketotic state in patient with type 2 diabetes mellitus ([place]) 12/08/2012  . Hypokalemia 12/08/2012  . ARF (acute renal failure) ([place]) 12/08/2012  . Dehydration 12/08/2012  . Obesity, morbid ([place]) 12/08/2012  . Tobacco abuse 12/08/2012  . SNORING 06/16/2009  . Diabetes mellitus without complication ([place]) 73/22/0254  . HYPERLIPIDEMIA 05/09/2009  . HYPERTENSION 05/09/2009    Past Surgical History:  Procedure Laterality Date  . TONSILLECTOMY      OB History    No data available       Home Medications    Prior to Admission medications   Medication Sig Start Date End Date Taking? Authorizing Provider  acetaminophen (TYLENOL) 500 MG tablet Take 1,000 mg by mouth every 6 (six) hours as needed for moderate pain.    Historical Provider, MD  albuterol (PROVENTIL HFA;VENTOLIN HFA) 108 (90 BASE) MCG/ACT inhaler Inhale 2 puffs into the lungs every 6 (six) hours as needed. For shortness of breath     Historical Provider, MD  amLODipine (NORVASC) 5 MG tablet Take 5 mg by  mouth daily.    Historical Provider, MD  B-D UF III MINI PEN NEEDLES 31G X 5 MM MISC See admin instructions. 04/28/15   Historical Provider, MD  cyclobenzaprine (FLEXERIL) 10 MG tablet take 1 tablet by mouth NIGHTLY AS NEEDED FOR MUSCLE SPASM 04/28/15   Historical Provider, MD  cyclobenzaprine (FLEXERIL) 5 MG tablet Take 1 tablet twice a day as needed for Muscle spasms. 01/31/15   Elayne Snare, MD  gabapentin (NEURONTIN) 400 MG capsule Take 400 mg by mouth 3 (three) times daily.    Historical Provider, MD  glucose blood (ONETOUCH VERIO) test strip Use as instructed to check blood sugar 2 times daily Dx code E11.9 11/03/14   Elayne Snare, MD  insulin aspart (NOVOLOG) 100 UNIT/ML injection Inject 6 Units into the skin 3 (three) times daily with meals. Patient taking differently: Inject 15 Units into the skin 3 (three) times daily with meals. 15-15-25 08/26/14   Elayne Snare, MD  Insulin Syringe-Needle U-100 (SAFETY-GLIDE 0.3CC SYR 29GX1/2) 29G X 1/2" 0.3 ML MISC Use as directed. 12/11/12   Modena Jansky, MD  levothyroxine (SYNTHROID, LEVOTHROID) 50 MCG tablet Take 50 mcg by mouth daily. 04/28/15   Historical Provider, MD  metFORMIN (GLUCOPHAGE-XR) 750 MG 24 hr tablet Take 2 tablets daily 05/06/15   Elayne Snare, MD  methocarbamol (ROBAXIN) 500 MG tablet Take 1 tablet (500 mg total) by mouth every 8 (eight) hours as needed for  muscle spasms (and pain). 01/17/15   Clayton Bibles, PA-C  NEXIUM 40 MG capsule Take 40 mg by mouth daily.  06/09/14   Historical Provider, MD  NOVOLIN 70/30 (70-30) 100 UNIT/ML injection inject 20 units subcutaneously three times a day [place] 04/28/15   Historical Provider, MD  nystatin cream (MYCOSTATIN) Apply to affected area 2 times daily 01/17/15   Clayton Bibles, PA-C  rosuvastatin (CRESTOR) 40 MG tablet Take 40 mg by mouth daily.     Historical Provider, MD  TRESIBA FLEXTOUCH 200 UNIT/ML SOPN Inject 60 Units into the skin at bedtime. 01/17/15   Clayton Bibles, PA-C  UNABLE TO FIND 1.  Glucometer x 1. 2. Lancets x 1 box. 3. Glucometer strips x 1 box. 12/11/12   Modena Jansky, MD    Family History Family History  Problem Relation Age of Onset  . Diabetes Mother   . Hypertension Mother   . Hyperlipidemia Mother   . Diabetes Father   . Heart disease Father   . Diabetes Sister     Social History Social History  Substance Use Topics  . Smoking status: Current Every Day Smoker    Packs/day: 0.10    Years: 23.00    Types: Cigarettes  . Smokeless tobacco: Never Used  . Alcohol use Yes     Comment: Occasional use: 4 drinks per occasion, once a month     Allergies   Ibuprofen; Peach flavor; Penicillins; and Strawberry extract   Review of Systems Review of Systems  All other systems reviewed and are negative.    Physical Exam Updated Vital Signs BP (!) 143/104 (BP Location: Left Arm)   Pulse 86   Temp 99.5 F (37.5 C) (Oral)   Resp 19   LMP 12/08/2012   SpO2 99%   Physical Exam  Constitutional: She is oriented to person, place, and time. She appears well-developed and well-nourished. No distress.  HENT:  Head: Normocephalic.  Nose: Nose normal.  Eyes: Conjunctivae are normal.  Neck: Neck supple. No tracheal deviation present.  Cardiovascular: Normal rate and regular rhythm.   Pulmonary/Chest: Effort normal. No respiratory distress.  Abdominal: Soft. She exhibits no distension.  Musculoskeletal:       Left foot: There is tenderness (over lateral dorsal foot) and swelling (minimal soft tissue on lateral foot). There is normal range of motion, normal capillary refill and no deformity.  Feet:  Left Foot:  Skin Integrity: Negative for skin breakdown, erythema or warmth.  Neurological: She is alert and oriented to person, place, and time.  Skin: Skin is warm and dry.  Psychiatric: She has a normal mood and affect.     ED Treatments / Results  Labs (all labs ordered are listed, but only abnormal results are displayed) Labs Reviewed - No data  to display  EKG  EKG Interpretation None       Radiology Dg Foot Complete Left  Result Date: 01/30/2016 CLINICAL DATA:  Foot pain.  Fall. EXAM: LEFT FOOT - COMPLETE 3+ VIEW COMPARISON:  07/29/2012. FINDINGS: Diffuse degenerative change. No evidence of fracture or dislocation. Diffuse soft tissue swelling. IMPRESSION: Diffuse soft tissue swelling.  No acute bony abnormality . Electronically Signed   By: Marcello Moores  Register   On: 01/30/2016 11:19    Procedures Procedures (including critical care time)  Medications Ordered in ED Medications - No data to display   Initial Impression / Assessment and Plan / ED Course  I have reviewed the triage vital signs and the nursing notes.  Pertinent labs & imaging results that were available during my care of the patient were reviewed by me and considered in my medical decision making (see chart for details).  Clinical Course    50 y.o. female presents with left foot pain after twisting it 2 weeks ago. She continues to have pain and doesn't feel she can walk on it. She is morbidly obese which makes this more difficult. Plain film negative. Pt given instructions for supportive care including NSAIDs, rest, ice, compression, and elevation to help alleviate symptoms. Has CKD so topical NSAIDs prescribed as nephroprotective measure. Plan to follow up with PCP as needed and return precautions discussed for worsening or new concerning symptoms.   Final Clinical Impressions(s) / ED Diagnoses   Final diagnoses:  Left foot pain    New Prescriptions Discharge Medication List as of 01/30/2016 11:37 AM    START taking these medications   Details  diclofenac sodium (VOLTAREN) 1 % GEL Apply 2 g topically 4 (four) times daily., Starting Mon 01/30/2016, Print         Leo Grosser, MD 01/30/16 [phone]

## 2016-01-30 NOTE — ED Notes (Signed)
Patient transported to X-ray 

## 2016-01-30 NOTE — ED Notes (Signed)
Pt given sandwich, cheese stick and Sprite zero.

## 2016-01-30 NOTE — ED Triage Notes (Signed)
Patient state that for over week been having left foot pain.  Patient states that pain is worse and unable to bear weight on it.  Patient refuses to get into bed. Patient prefers to stay in wheelchair.

## 2017-05-03 ENCOUNTER — Emergency Department (HOSPITAL_COMMUNITY)
Admission: EM | Admit: 2017-05-03 | Discharge: 2017-05-03 | Disposition: A | Discharge status: Home / Self Care | Payer: Medicare Other | Attending: Emergency Medicine | Admitting: Emergency Medicine

## 2017-05-03 ENCOUNTER — Emergency Department (HOSPITAL_COMMUNITY): Payer: Medicare Other

## 2017-05-03 ENCOUNTER — Encounter (HOSPITAL_COMMUNITY): Payer: Self-pay | Admitting: Emergency Medicine

## 2017-05-03 DIAGNOSIS — Z79899 Other long term (current) drug therapy: Secondary | ICD-10-CM | POA: Insufficient documentation

## 2017-05-03 DIAGNOSIS — N183 Chronic kidney disease, stage 3 (moderate): Secondary | ICD-10-CM | POA: Diagnosis not present

## 2017-05-03 DIAGNOSIS — E1122 Type 2 diabetes mellitus with diabetic chronic kidney disease: Secondary | ICD-10-CM | POA: Insufficient documentation

## 2017-05-03 DIAGNOSIS — F17210 Nicotine dependence, cigarettes, uncomplicated: Secondary | ICD-10-CM | POA: Diagnosis not present

## 2017-05-03 DIAGNOSIS — I129 Hypertensive chronic kidney disease with stage 1 through stage 4 chronic kidney disease, or unspecified chronic kidney disease: Secondary | ICD-10-CM | POA: Diagnosis not present

## 2017-05-03 DIAGNOSIS — M10071 Idiopathic gout, right ankle and foot: Secondary | ICD-10-CM | POA: Diagnosis not present

## 2017-05-03 DIAGNOSIS — M79671 Pain in right foot: Secondary | ICD-10-CM | POA: Diagnosis present

## 2017-05-03 DIAGNOSIS — Z794 Long term (current) use of insulin: Secondary | ICD-10-CM | POA: Insufficient documentation

## 2017-05-03 DIAGNOSIS — M109 Gout, unspecified: Secondary | ICD-10-CM

## 2017-05-03 LAB — CBC
HEMATOCRIT: 37.4 % (ref 36.0–46.0)
Hemoglobin: 12.3 g/dL (ref 12.0–15.0)
MCH: 26.2 pg (ref 26.0–34.0)
MCHC: 32.9 g/dL (ref 30.0–36.0)
MCV: 79.7 fL (ref 78.0–100.0)
Platelets: 258 10*3/uL (ref 150–400)
RBC: 4.69 MIL/uL (ref 3.87–5.11)
RDW: 14.5 % (ref 11.5–15.5)
WBC: 11.4 10*3/uL — ABNORMAL HIGH (ref 4.0–10.5)

## 2017-05-03 LAB — BASIC METABOLIC PANEL
Anion gap: 9 (ref 5–15)
BUN: 25 mg/dL — AB (ref 6–20)
CALCIUM: 9.6 mg/dL (ref 8.9–10.3)
CO2: 22 mmol/L (ref 22–32)
Chloride: 102 mmol/L (ref 101–111)
Creatinine, Ser: 2.28 mg/dL — ABNORMAL HIGH (ref 0.44–1.00)
GFR calc Af Amer: 28 mL/min — ABNORMAL LOW (ref >60)
GFR, EST NON AFRICAN AMERICAN: 24 mL/min — AB (ref >60)
GLUCOSE: 215 mg/dL — AB (ref 65–99)
POTASSIUM: 4.5 mmol/L (ref 3.5–5.1)
Sodium: 133 mmol/L — ABNORMAL LOW (ref 135–145)

## 2017-05-03 LAB — CBG MONITORING, ED: Glucose-Capillary: 207 mg/dL — ABNORMAL HIGH (ref 65–99)

## 2017-05-03 LAB — C-REACTIVE PROTEIN: CRP: 21 mg/dL — ABNORMAL HIGH (ref <1.0)

## 2017-05-03 LAB — URIC ACID: Uric Acid, Serum: 11.1 mg/dL — ABNORMAL HIGH (ref 2.3–6.6)

## 2017-05-03 LAB — SEDIMENTATION RATE: SED RATE: 65 mm/h — AB (ref 0–22)

## 2017-05-03 IMAGING — CR DG FOOT COMPLETE 3+V*R*
4 series · 4 of 4 positions shown · non-contrast
Comparison: None.

CLINICAL DATA: Dorsal lateral right foot pain. Fracture 20 years
ago.

EXAM:
RIGHT FOOT COMPLETE - 3+ VIEW

[x foot ap right]
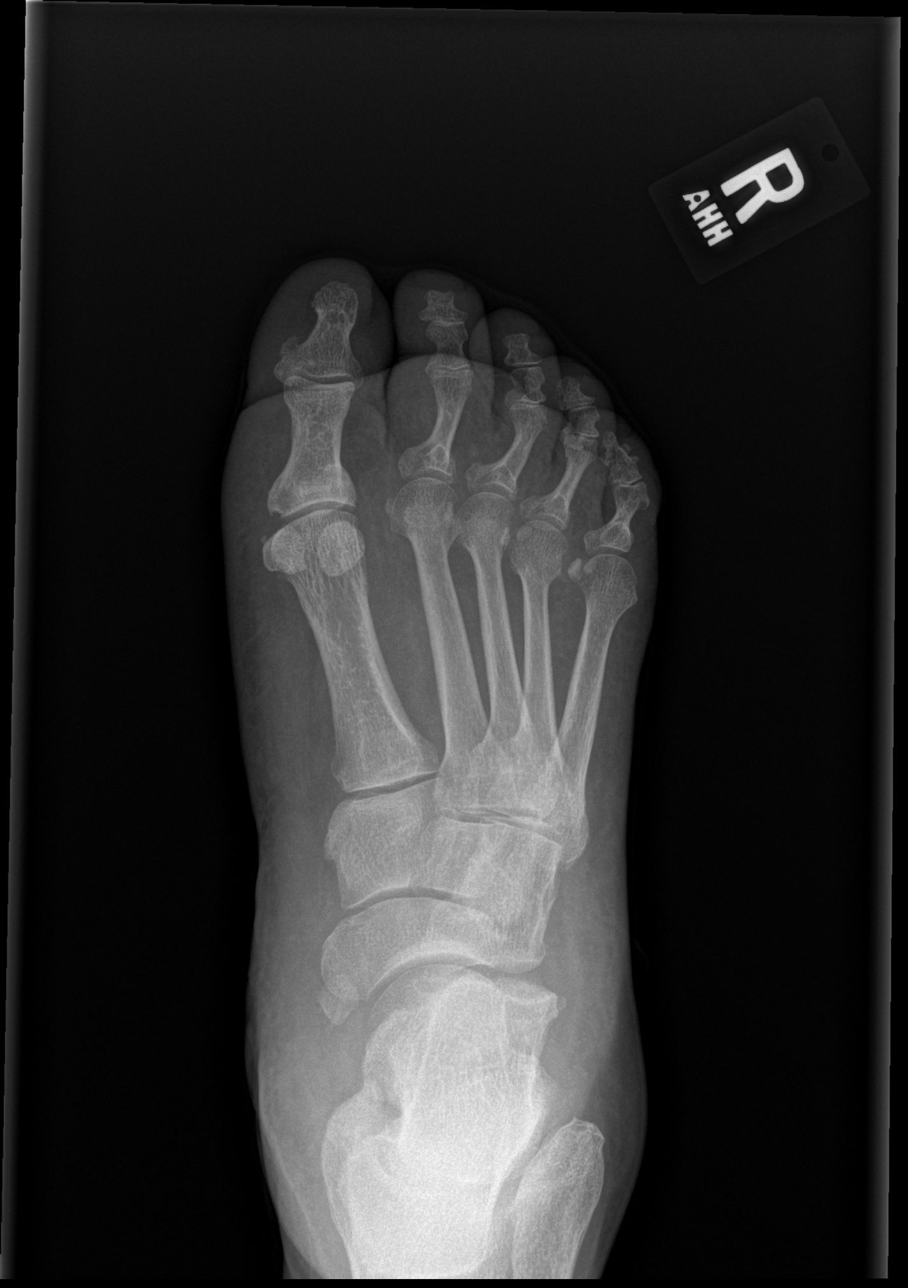

[x foot obl right]
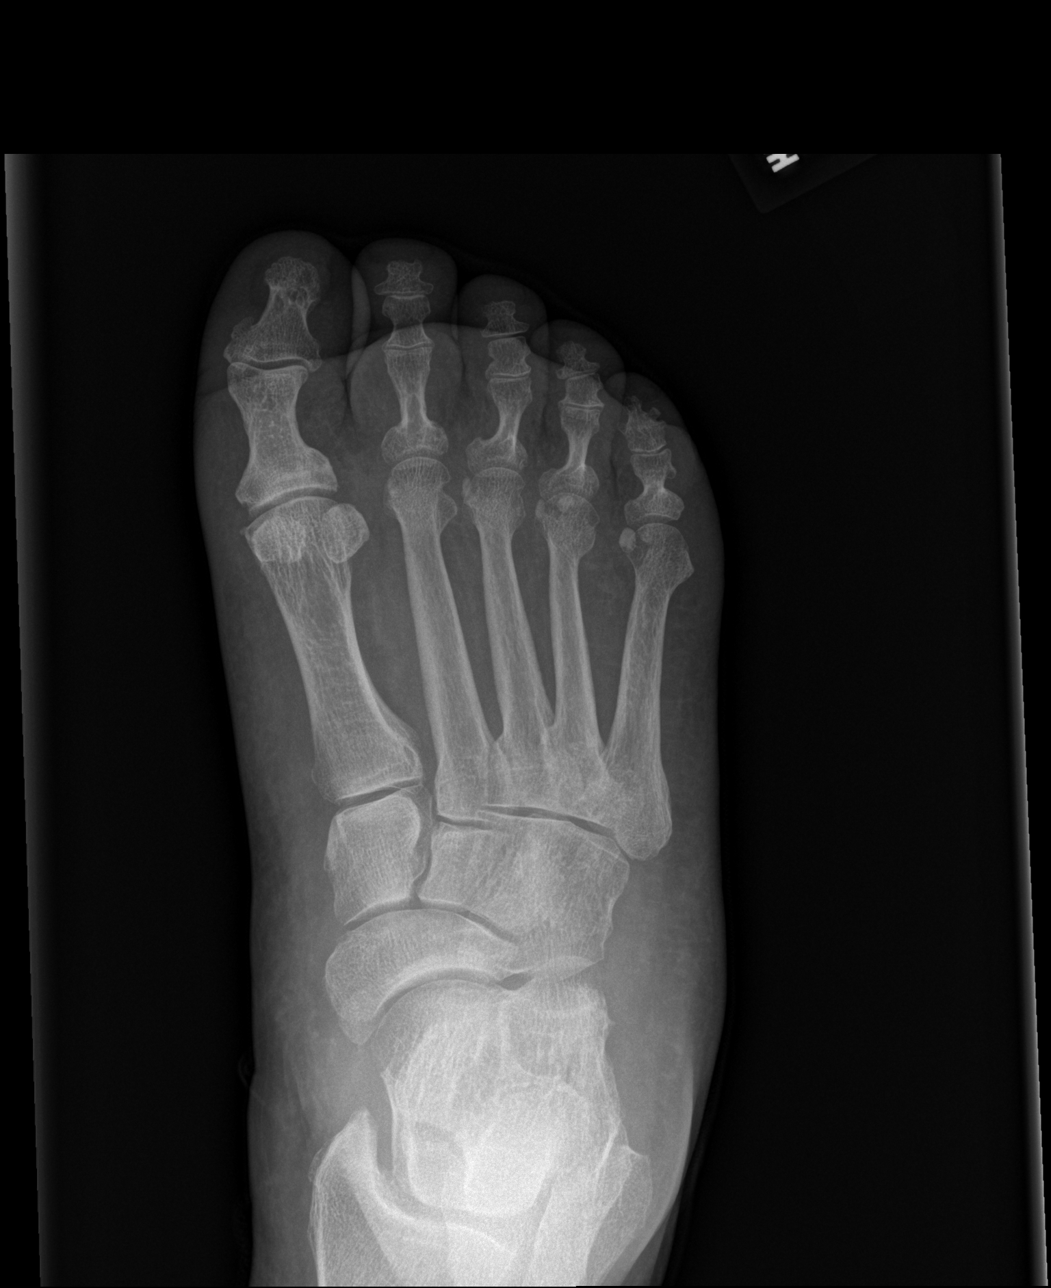

[x foot lat right (1 of 2)]
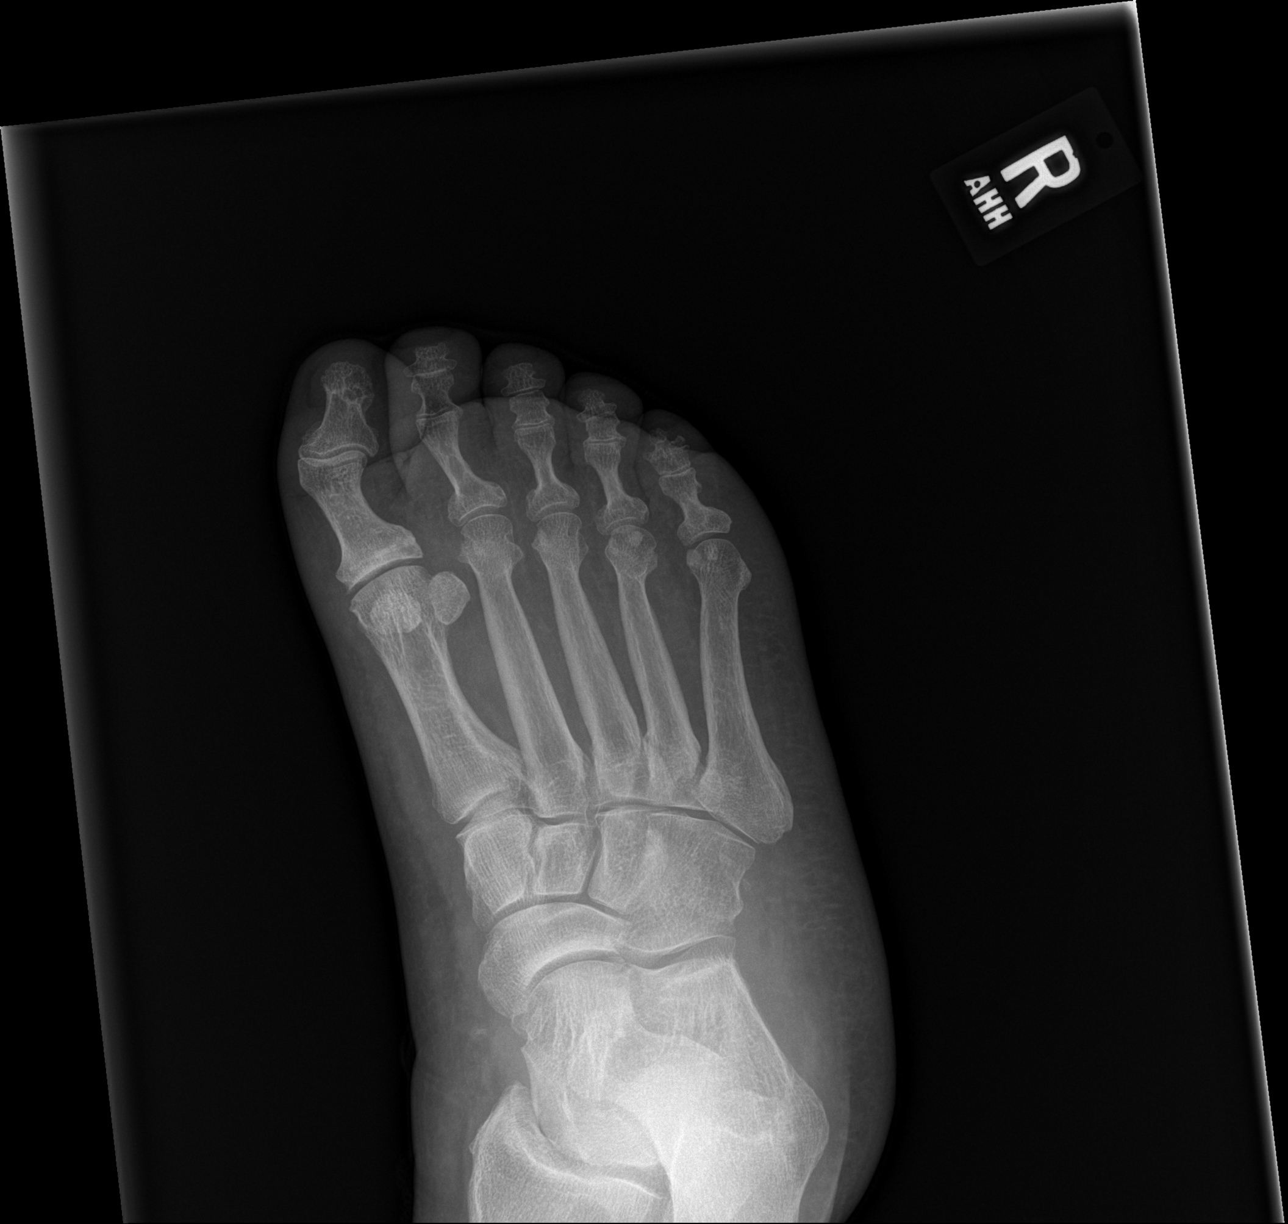

[x foot lat right (2 of 2)]
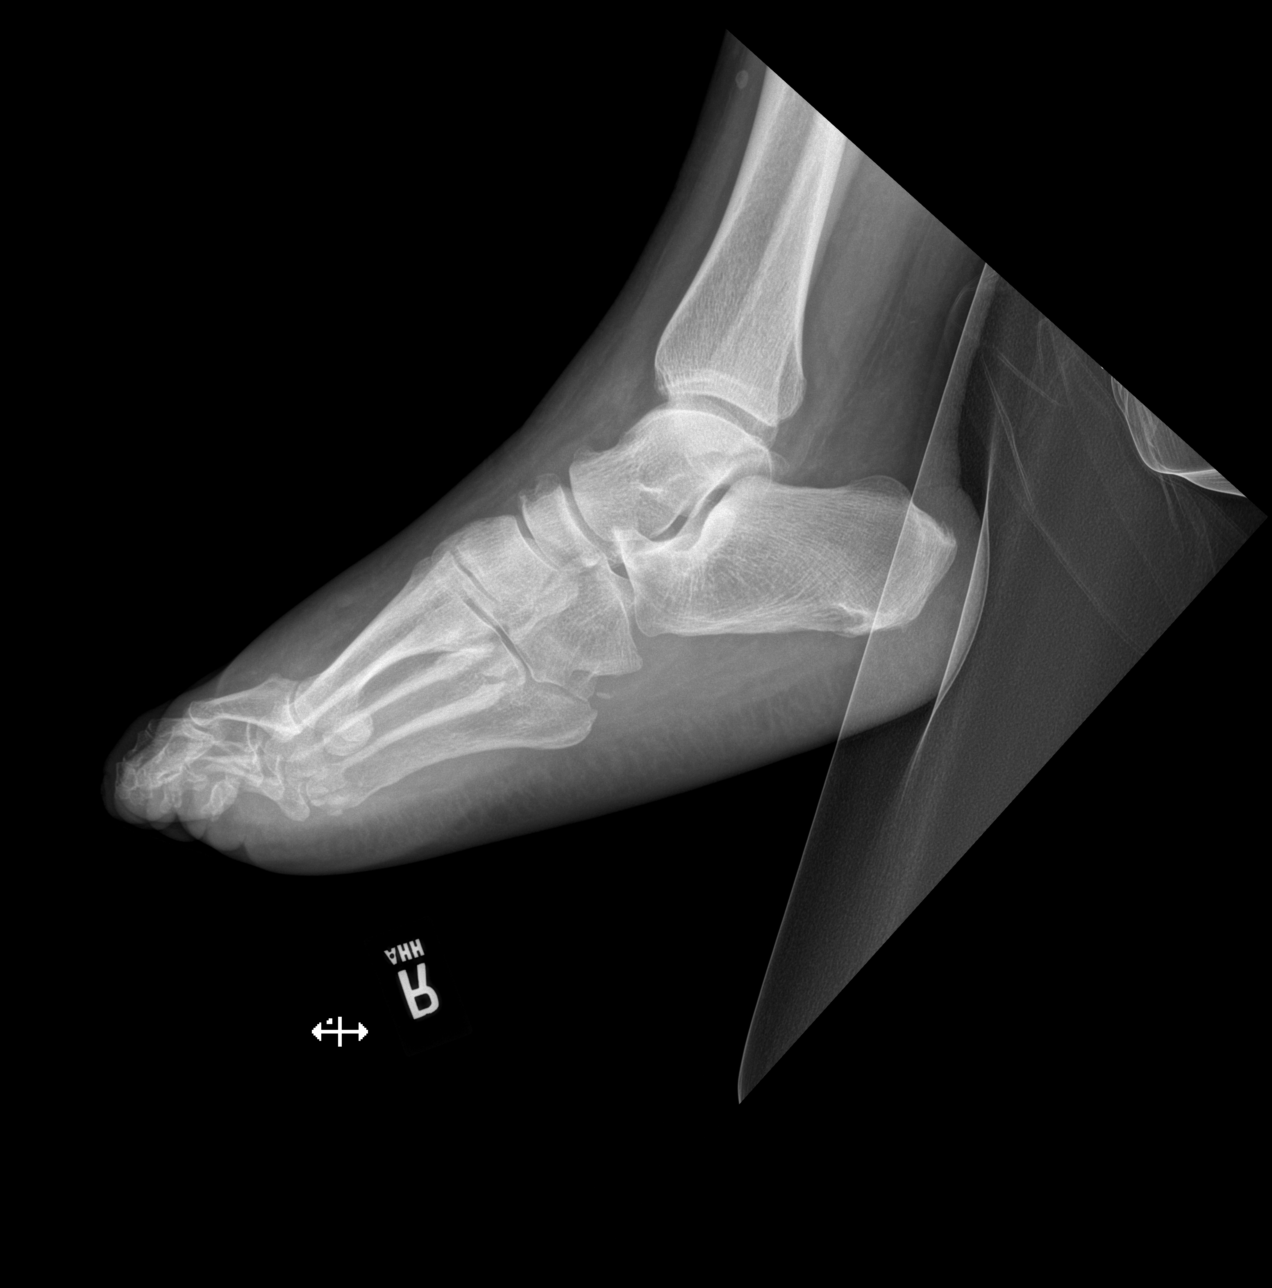

[4 of 4 positions shown; findings below may reference images not displayed]

FINDINGS: Mild interphalangeal articular space narrowing compatible with
osteoarthritis.

2 mm erosion along the medial base of the proximal phalanx great
toe.

Type 2 accessory navicular.  No malalignment at the Lisfranc joint.

Dorsal midfoot spurring. There is dorsal subcutaneous edema along
the forefoot. Small plantar and Achilles calcaneal spurs.
IMPRESSION: 1. Dorsal forefoot soft tissue swelling without findings of
underlying osteomyelitis or foreign body.
2. Small erosion medially along the base of the proximal phalanx
great toe could be from gout or rheumatoid arthritis. Mild articular
space narrowing in the interphalangeal joints.
3. Mild dorsal midfoot degenerative spurring.

## 2017-05-03 MED ORDER — ALLOPURINOL 100 MG PO TABS: 100.0000 mg | ORAL_TABLET | Freq: Every day | ORAL | 0 refills | Status: DC

## 2017-05-03 MED ORDER — HYDROMORPHONE HCL 1 MG/ML IJ SOLN
0.5000 mg | Freq: Once | INTRAMUSCULAR | Status: AC
  Administered 2017-05-03: 0.5 mg via INTRAVENOUS
  Filled 2017-05-03: qty 1

## 2017-05-03 MED ORDER — PREDNISONE 20 MG PO TABS: 40.0000 mg | ORAL_TABLET | Freq: Every day | ORAL | 0 refills | Status: DC

## 2017-05-03 MED ORDER — ONDANSETRON HCL 4 MG/2ML IJ SOLN
4.0000 mg | Freq: Once | INTRAMUSCULAR | Status: AC
  Administered 2017-05-03: 4 mg via INTRAVENOUS
  Filled 2017-05-03: qty 2

## 2017-05-03 MED ORDER — PREDNISONE 20 MG PO TABS
40.0000 mg | ORAL_TABLET | Freq: Once | ORAL | Status: AC
  Administered 2017-05-03: 40 mg via ORAL
  Filled 2017-05-03: qty 2

## 2017-05-03 MED ORDER — SODIUM CHLORIDE 0.9 % IV BOLUS (SEPSIS)
500.0000 mL | Freq: Once | INTRAVENOUS | Status: AC
  Administered 2017-05-03: 500 mL via INTRAVENOUS

## 2017-05-03 NOTE — Discharge Instructions (Addendum)
Xrays reassuring.   Please take 40mg  prednisone daily for the next 5 days. Also take allopurinol daily for gout.  The prednisone will help with inflammation and pain, but it will also increase your blood sugar so please monitor that closely.   Follow up with your primary doctor if your pain is not improving in a week.   Return to the ER if your have fever >100.58F, worsening redness and swelling or have any new or worsening symptoms.

## 2017-05-03 NOTE — ED Triage Notes (Signed)
Patient brought from home by [facility] for right foot pain and swelling that has gotten worse over the past couple days. Patient did break that foot 20 years ago and with weather changes has problems out of it.

## 2017-05-03 NOTE — ED Notes (Signed)
Bed: WTR6 Expected date:  Expected time:  Means of arrival:  Comments: 

## 2017-05-03 NOTE — ED Provider Notes (Signed)
MSE was initiated and I personally evaluated the patient and placed orders (if any) at  12:40 PM on May 03, 2017.  The patient appears stable so that the remainder of the MSE may be completed by another provider.   Seen and evaluated for chief complaint of R foot pain.  Pertinent H&P findings include diabetic, No hx of gout .  Based on initial evaluation, labs are indicated and radiology studies are indicated.  Patient counseled on process, plan, and necessity for staying for completing the evaluation  Objective findings of diffuse redness and swelling along the toes/ MTP line and lateral side of the foot. Nails cut too short providing site of potential entry for cellulitis. No HX of gout or previous issues with foot. R/o infection vs inflammatory     Margarita Mail, PA-C 05/03/17 1243    Davonna Belling, MD 05/03/17 [phone]

## 2017-05-03 NOTE — ED Notes (Signed)
RN will collect labs at IV start 

## 2017-05-03 NOTE — ED Provider Notes (Signed)
[facility] DEPT Provider Note   CSN: [id number] Arrival date & time: 05/03/17  8469     History   Chief Complaint Chief Complaint  Patient presents with  . Foot Pain    HPI Makayla [name] is a 52 y.o. female.  HPI   Makayla [name] is a 52yo female with a history of IDDM, HTN, HLD, CKD stage III, morbid obesity who presents to the Emergency Department for evaluation of right foot pain. Patient states that her pain began two days ago. Pain is primarily located over the MTP joint of the big toe with associated redness and swelling. She states that she had a fracture in this foot 43yr ago and every so often gets pain in the foot, but this is worse than in the past. States pain is severe and "throbbing." Pain is worsened with palpating over the MTP joint of the great toe or with ambulation. She has been using a walker at home to assist with ambulation due to her pain. She denies fever, chills, numbness, weakness, chest pain, shortness of breath, abdominal pain, N/V, dysuria. States that she takes insulin at home for her diabetes and her blood sugar is typically "in the 200s."  Past Medical History:  Diagnosis Date  . Cellulitis   . [place]   . CKD (chronic [place] disease), stage III ([place]   . Diabetes mellitus   . Hyperlipidemia   . Hypertension   . Neuropathy     Patient Active Problem List   Diagnosis Date Noted  . Renal failure, acute on chronic (HCC) 12/10/2012  . Hyperosmolar non-ketotic state in patient with type 2 diabetes mellitus ([place] 12/08/2012  . Hypokalemia 12/08/2012  . ARF (acute renal failure) ([place] 12/08/2012  . Dehydration 12/08/2012  . Obesity, morbid ([place] 12/08/2012  . Tobacco abuse 12/08/2012  . SNORING 06/16/2009  . Diabetes mellitus without complication ([place] 062/95/2841 . HYPERLIPIDEMIA 05/09/2009  . HYPERTENSION 05/09/2009    Past Surgical History:  Procedure Laterality Date  . TONSILLECTOMY      OB  History    No data available       Home Medications    Prior to Admission medications   Medication Sig Start Date End Date Taking? Authorizing Provider  acetaminophen (TYLENOL) 500 MG tablet Take 1,000 mg by mouth every 6 (six) hours as needed for moderate pain.   Yes [provider]  albuterol (PROVENTIL HFA;VENTOLIN HFA) 108 (90 BASE) MCG/ACT inhaler Inhale 2 puffs into the lungs every 6 (six) hours as needed. For shortness of breath    Yes [provider]  amLODipine (NORVASC) 5 MG tablet Take 5 mg by mouth daily.   Yes [provider]  Butalbital-APAP-Caffeine 50-300-40 MG CAPS Take 1 tablet by mouth daily as needed for migraine. 03/13/17  Yes [provider]  chlorthalidone (HYGROTON) 25 MG tablet Take 12.5 mg by mouth daily. 04/18/17  Yes [provider]  cyclobenzaprine (FLEXERIL) 5 MG tablet Take 1 tablet twice a day as needed for Muscle spasms. 01/31/15  Yes KElayne Snare MD  gabapentin (NEURONTIN) 400 MG capsule Take 400 mg by mouth 3 (three) times daily.   Yes [provider]  insulin aspart (NOVOLOG) 100 UNIT/ML injection Inject 6 Units into the skin 3 (three) times daily with meals. Patient taking differently: Inject 15 Units into the skin 3 (three) times daily with meals. 15-15-25 08/26/14  Yes KElayne Snare MD  levothyroxine (SYNTHROID, LEVOTHROID) 50 MCG tablet Take 50 mcg by  mouth daily. 04/28/15  Yes [provider]  lisinopril (PRINIVIL,ZESTRIL) 10 MG tablet Take 10 mg by mouth daily. 04/18/17  Yes [provider]  metFORMIN (GLUCOPHAGE-XR) 750 MG 24 hr tablet Take 2 tablets daily 05/06/15  Yes Elayne Snare, MD  methocarbamol (ROBAXIN) 500 MG tablet Take 1 tablet (500 mg total) by mouth every 8 (eight) hours as needed for muscle spasms (and pain). 01/17/15  Yes West, Emily, PA-C  NEXIUM 40 MG capsule Take 40 mg by mouth daily.  06/09/14  Yes [provider]  NOVOLIN 70/30 (70-30) 100 UNIT/ML injection  inject 20 units subcutaneously three times a day [place] 04/28/15  Yes [provider]  rosuvastatin (CRESTOR) 40 MG tablet Take 40 mg by mouth daily.    Yes [provider]  topiramate (TOPAMAX) 50 MG tablet Take 50 mg by mouth 2 (two) times daily. 04/18/17  Yes [provider]  TRESIBA FLEXTOUCH 200 UNIT/ML SOPN Inject 60 Units into the skin at bedtime. 01/17/15  Yes West, Emily, PA-C  B-D UF III MINI PEN NEEDLES 31G X 5 MM MISC See admin instructions. 04/28/15   [provider]  cyclobenzaprine (FLEXERIL) 10 MG tablet take 1 tablet by mouth NIGHTLY AS NEEDED FOR MUSCLE SPASM 04/28/15   [provider]  diclofenac sodium (VOLTAREN) 1 % GEL Apply 2 g topically 4 (four) times daily. Patient not taking: Reported on 05/03/2017 01/30/16   Leo Grosser, MD  glucose blood [facility] VERIO) test strip Use as instructed to check blood sugar 2 times daily Dx code E11.9 11/03/14   Elayne Snare, MD  Insulin Syringe-Needle U-100 (SAFETY-GLIDE 0.3CC SYR 29GX1/2) 29G X 1/2" 0.3 ML MISC Use as directed. 12/11/12   Hongalgi, Lenis Dickinson, MD  nystatin cream (MYCOSTATIN) Apply to affected area 2 times daily Patient not taking: Reported on 05/03/2017 01/17/15   Clayton Bibles, PA-C  UNABLE TO FIND 1. Glucometer x 1. 2. Lancets x 1 box. 3. Glucometer strips x 1 box. 12/11/12   Hongalgi, Lenis Dickinson, MD    Family History Family History  Problem Relation Age of Onset  . Diabetes Mother   . Hypertension Mother   . Hyperlipidemia Mother   . Diabetes Father   . Heart disease Father   . Diabetes Sister     Social History Social History   Tobacco Use  . Smoking status: Current Every Day Smoker    Packs/day: 0.10    Years: 23.00    Pack years: 2.30    Types: Cigarettes  . Smokeless tobacco: Never Used  Substance Use Topics  . Alcohol use: Yes    Comment: Occasional use: 4 drinks per occasion, once a month  . Drug use: No     Allergies   Ibuprofen; Peach flavor;  Penicillins; and Strawberry extract   Review of Systems Review of Systems  Constitutional: Negative for chills, fatigue and fever.  Eyes: Negative for visual disturbance.  Respiratory: Negative for shortness of breath.   Cardiovascular: Negative for chest pain.  Gastrointestinal: Negative for abdominal pain, nausea and vomiting.  Genitourinary: Negative for difficulty urinating, dysuria, flank pain and frequency.  Musculoskeletal: Positive for arthralgias (right foot pain), gait problem (gait painful) and joint swelling (right big toe).  Skin: Positive for color change (redness in right great toe). Negative for wound.  Neurological: Negative for weakness, numbness and headaches.  Psychiatric/Behavioral: Negative for agitation.  All other systems reviewed and are negative.    Physical Exam Updated Vital Signs BP 121/78 (BP  Location: Right Arm)   Pulse 93   Temp 98.3 F (36.8 C) (Oral)   Resp 18   LMP 12/08/2012   SpO2 99%   Physical Exam  Constitutional: She is oriented to person, place, and time. She appears well-developed and well-nourished. No distress.  Sitting at bedside in no apparent distress.   HENT:  Head: Normocephalic and atraumatic.  Mouth/Throat: Oropharynx is clear and moist. No oropharyngeal exudate.  Eyes: Conjunctivae are normal. Pupils are equal, round, and reactive to light. Right eye exhibits no discharge. Left eye exhibits no discharge.  Neck: Normal range of motion. Neck supple.  Cardiovascular: Normal rate, regular rhythm and intact distal pulses. Exam reveals no friction rub.  No murmur heard. Pulmonary/Chest: Effort normal and breath sounds normal. No stridor. No respiratory distress. She has no wheezes.  Abdominal: Soft. Bowel sounds are normal. There is no tenderness. There is no guarding.  Musculoskeletal:  Tender to palpation over 1st MTP joint of the right foot. There is overlying erythema, warmth and swelling at this joint (see picture below.)  No tenderness over distal phalynx of the great toe or 2nd-5th toe. No ankle tenderness. No leg swelling or calf tenderness. Full active ROM of the toes and ankle of the right foot. DP pulses 2+ bilaterally. Capillary refill <2sec bilaterally.   Neurological: She is alert and oriented to person, place, and time. Coordination normal.  Distal sensation to light/sharp touch intact in bilateral LE. Patellar reflex 1+ bilaterally. Gait normal in coordination and balance, although painful.   Skin: Skin is warm and dry. Capillary refill takes less than 2 seconds. She is not diaphoretic.  Psychiatric: She has a normal mood and affect. Her behavior is normal.  Nursing note and vitals reviewed.      ED Treatments / Results  Labs (all labs ordered are listed, but only abnormal results are displayed) Labs Reviewed  BASIC METABOLIC PANEL - Abnormal; Notable for the following components:      Result Value   Sodium 133 (*)    Glucose, Bld 215 (*)    BUN 25 (*)    Creatinine, Ser 2.28 (*)    GFR calc non Af Amer 24 (*)    GFR calc Af Amer 28 (*)    All other components within normal limits  CBC - Abnormal; Notable for the following components:   WBC 11.4 (*)    All other components within normal limits  SEDIMENTATION RATE - Abnormal; Notable for the following components:   Sed Rate 65 (*)    All other components within normal limits  URIC ACID - Abnormal; Notable for the following components:   Uric Acid, Serum 11.1 (*)    All other components within normal limits  CBG MONITORING, ED - Abnormal; Notable for the following components:   Glucose-Capillary 207 (*)    All other components within normal limits  C-REACTIVE PROTEIN    EKG  EKG Interpretation None       Radiology Dg Foot Complete Right  Result Date: 05/03/2017 CLINICAL DATA:  Dorsal lateral right foot pain. Fracture 20 years ago. EXAM: RIGHT FOOT COMPLETE - 3+ VIEW COMPARISON:  None. FINDINGS: Mild interphalangeal articular  space narrowing compatible with osteoarthritis. 2 mm erosion along the medial base of the proximal phalanx great toe. Type 2 accessory navicular.  No malalignment at the Lisfranc joint. Dorsal midfoot spurring. There is dorsal subcutaneous edema along the forefoot. Small plantar and Achilles calcaneal spurs. IMPRESSION: 1. Dorsal forefoot soft tissue swelling without  findings of underlying osteomyelitis or foreign body. 2. Small erosion medially along the base of the proximal phalanx great toe could be from gout or rheumatoid arthritis. Mild articular space narrowing in the interphalangeal joints. 3. Mild dorsal midfoot degenerative spurring. Electronically Signed   By: Van Clines M.D.   On: 05/03/2017 11:57    Procedures Procedures (including critical care time)  Medications Ordered in ED Medications  sodium chloride 0.9 % bolus 500 mL (500 mLs Intravenous New Bag/Given 05/03/17 1302)  HYDROmorphone (DILAUDID) injection 0.5 mg (0.5 mg Intravenous Given 05/03/17 1302)  ondansetron (ZOFRAN) injection 4 mg (4 mg Intravenous Given 05/03/17 1302)     Initial Impression / Assessment and Plan / ED Course  I have reviewed the triage vital signs and the nursing notes.  Pertinent labs & imaging results that were available during my care of the patient were reviewed by me and considered in my medical decision making (see chart for details).  Pt presents with monoarticular pain, swelling and erythema.  Pt is afebrile and stable. Imaging reviewed, no evidence of occult fracture or injury. CRP, ESR and Uric Acid level elevated. Renal function poor (Cr. 2.28), she has a history of CKD. Will not start NSAID given kidney function. Will start patient on prednisone. Have counseled her that this will raise her blood sugar and it is important that she monitors this at home regularly. Patient states she has the supplies and is able to do this at home. Have also started allopurinol. She is able to ambulate  independently in the ED, although painful. Discussed this pt with Dr. Vanita Panda who agrees with the above treatment plan. Discussed return precautions and patient agrees and voices understanding to the above plan.   Final Clinical Impressions(s) / ED Diagnoses   Final diagnoses:  Podagra    ED Discharge Orders        Ordered    allopurinol (ZYLOPRIM) 100 MG tablet  Daily     05/03/17 1524    predniSONE (DELTASONE) 20 MG tablet  Daily     05/03/17 1524       Bernarda Caffey 05/04/17 0825    Carmin Muskrat, MD 05/05/17 [phone]

## 2017-11-17 ENCOUNTER — Other Ambulatory Visit: Payer: Self-pay

## 2017-11-17 ENCOUNTER — Emergency Department (HOSPITAL_COMMUNITY)
Admission: EM | Admit: 2017-11-17 | Discharge: 2017-11-17 | Disposition: A | Discharge status: Home / Self Care | Payer: Medicare Other | Attending: Emergency Medicine | Admitting: Emergency Medicine

## 2017-11-17 ENCOUNTER — Emergency Department (HOSPITAL_COMMUNITY): Payer: Medicare Other

## 2017-11-17 ENCOUNTER — Encounter (HOSPITAL_COMMUNITY): Payer: Self-pay

## 2017-11-17 DIAGNOSIS — Z794 Long term (current) use of insulin: Secondary | ICD-10-CM | POA: Diagnosis not present

## 2017-11-17 DIAGNOSIS — M79672 Pain in left foot: Secondary | ICD-10-CM | POA: Insufficient documentation

## 2017-11-17 DIAGNOSIS — M79671 Pain in right foot: Secondary | ICD-10-CM | POA: Diagnosis present

## 2017-11-17 DIAGNOSIS — N183 Chronic kidney disease, stage 3 (moderate): Secondary | ICD-10-CM | POA: Insufficient documentation

## 2017-11-17 DIAGNOSIS — E1122 Type 2 diabetes mellitus with diabetic chronic kidney disease: Secondary | ICD-10-CM | POA: Insufficient documentation

## 2017-11-17 DIAGNOSIS — I129 Hypertensive chronic kidney disease with stage 1 through stage 4 chronic kidney disease, or unspecified chronic kidney disease: Secondary | ICD-10-CM | POA: Diagnosis not present

## 2017-11-17 DIAGNOSIS — Z79899 Other long term (current) drug therapy: Secondary | ICD-10-CM | POA: Insufficient documentation

## 2017-11-17 IMAGING — CR DG FOOT COMPLETE 3+V*L*
3 series · 3 of 3 positions shown · non-contrast
Comparison: [DATE]

CLINICAL DATA: Bilateral feet swelling for 3 days.

EXAM:
LEFT FOOT - COMPLETE 3+ VIEW

[x foot lat left]
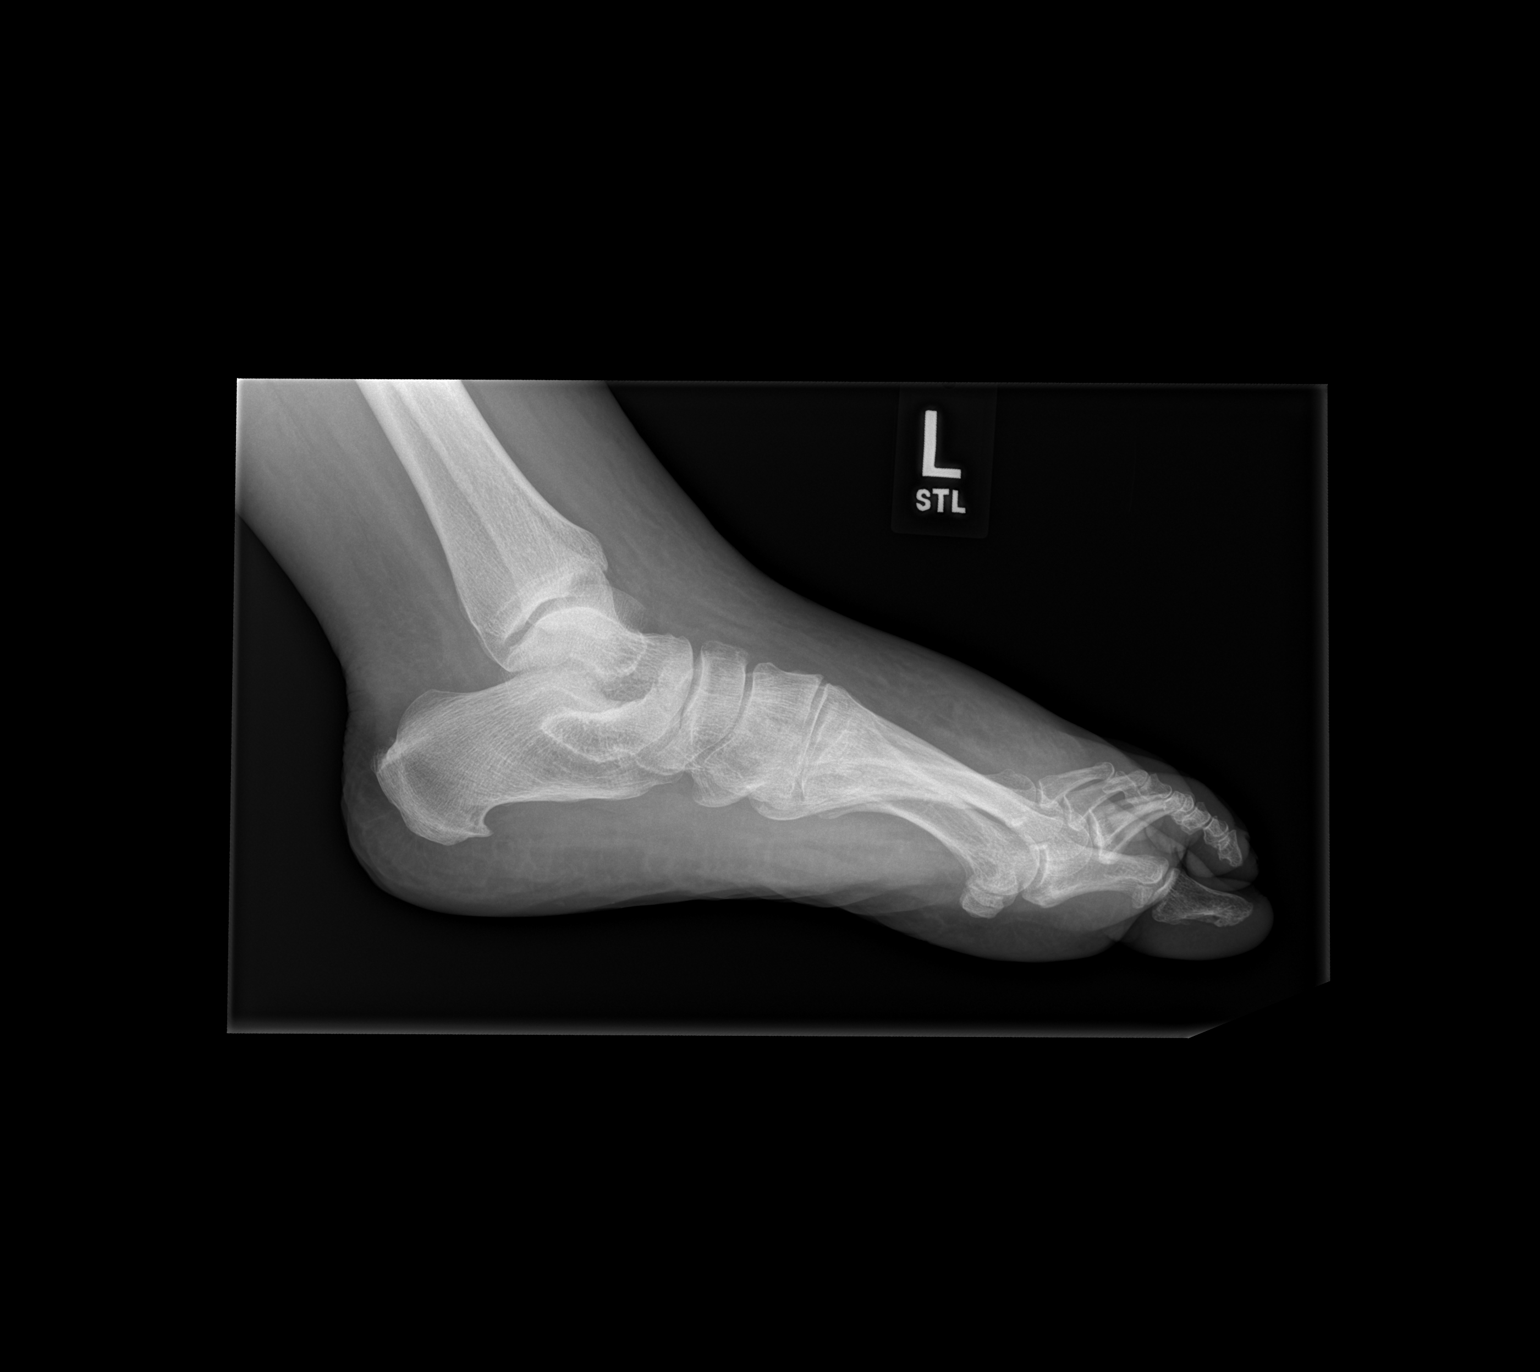

[x foot ap left]
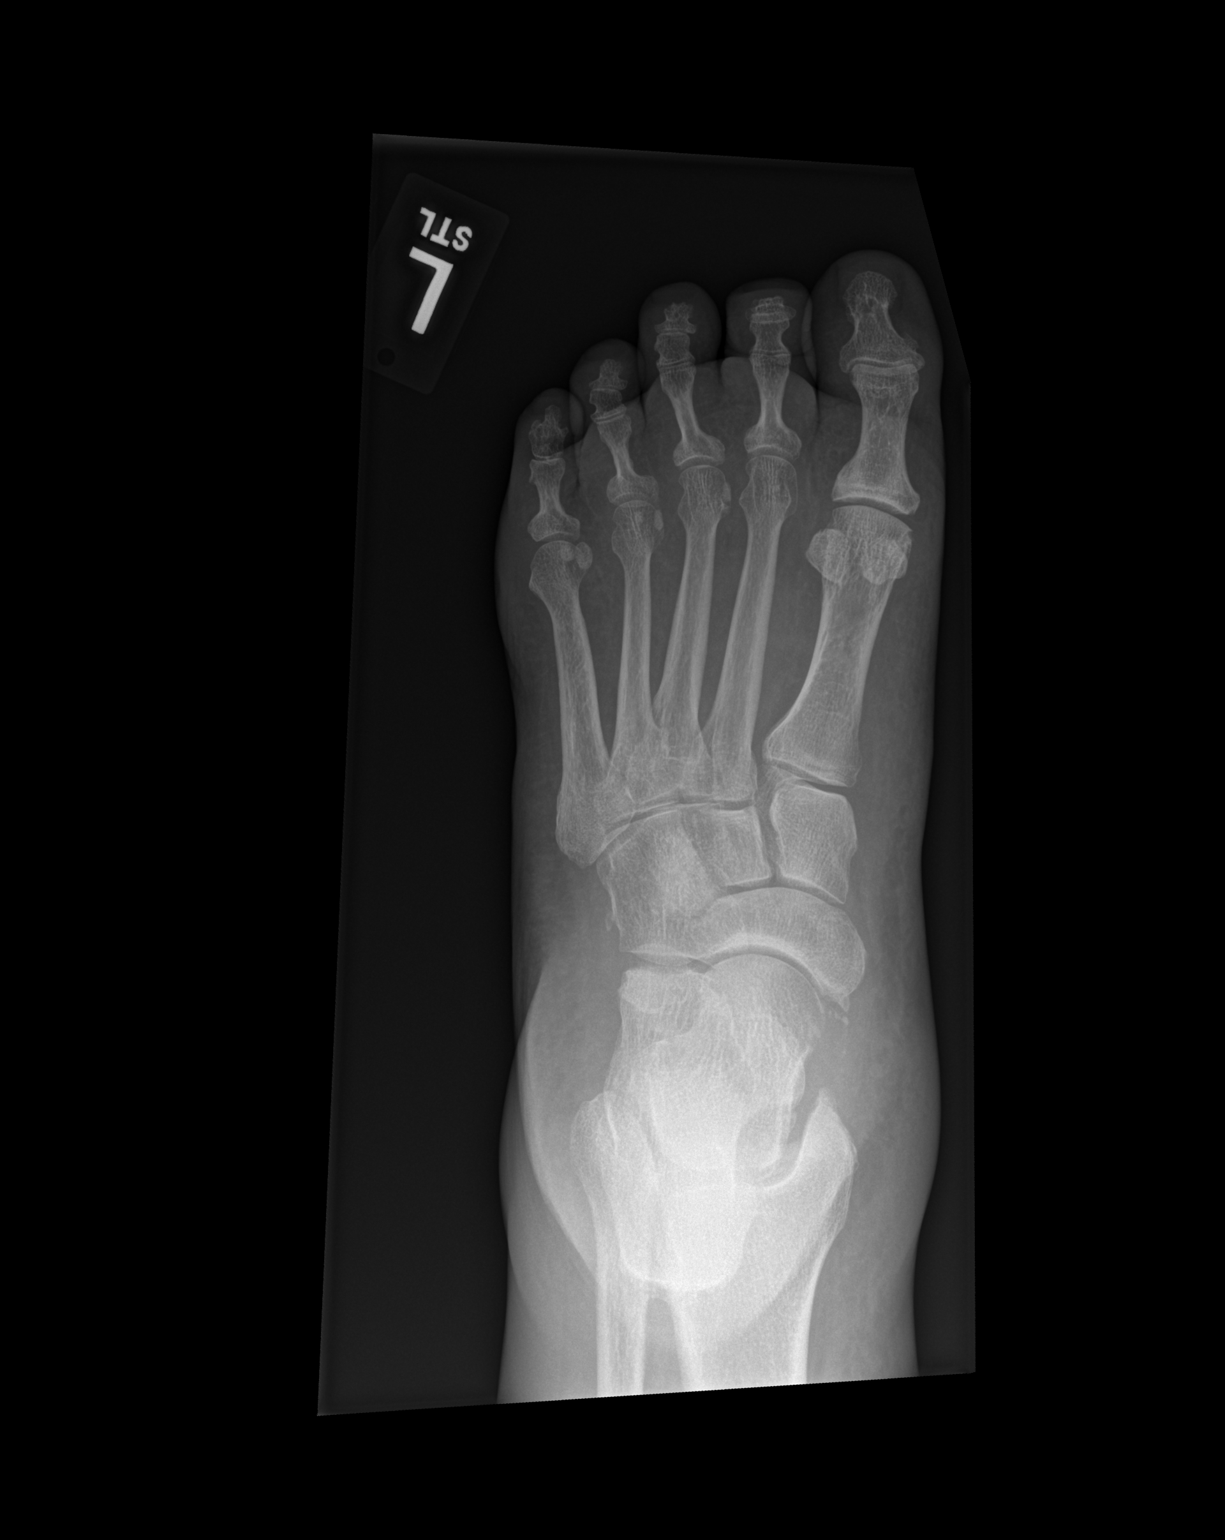

[x foot obl left]
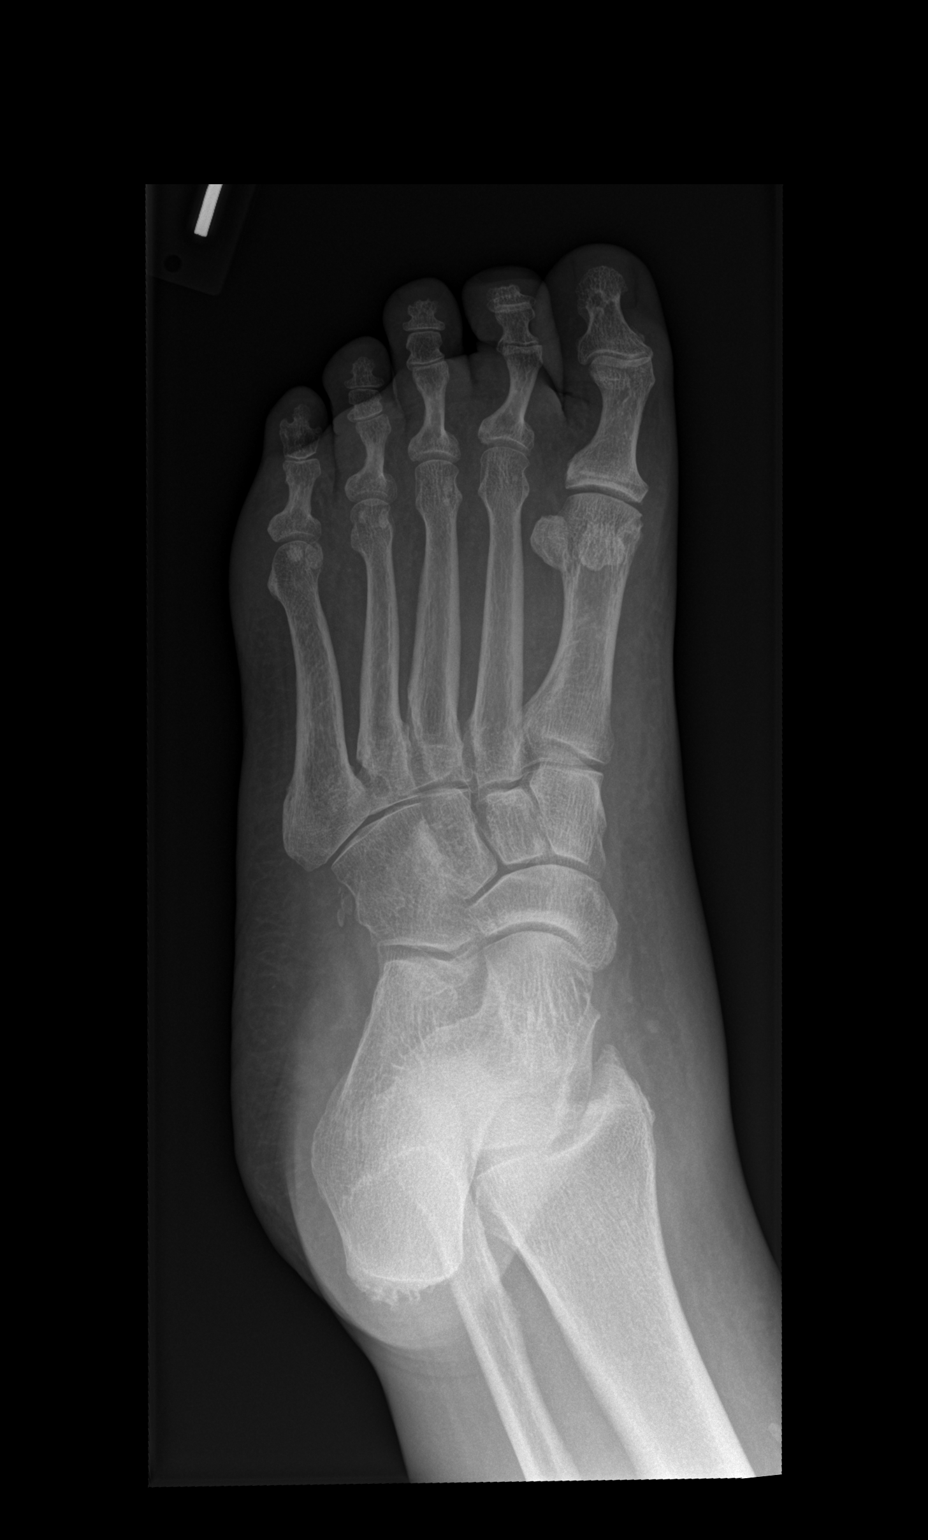

[3 of 3 positions shown; findings below may reference images not displayed]

FINDINGS: There is no evidence of fracture or dislocation. There is probable
generalized soft tissue swelling of the left foot. Plantar calcaneal
spur is identified.
IMPRESSION: No acute fracture or dislocation. Probable generalized soft tissue
swelling of the left foot.

## 2017-11-17 IMAGING — CR DG FOOT COMPLETE 3+V*R*
3 series · 3 of 3 positions shown · non-contrast
Comparison: [DATE]

CLINICAL DATA: Bilateral feet swelling for 3 days.

EXAM:
RIGHT FOOT COMPLETE - 3+ VIEW

[x foot lat right]
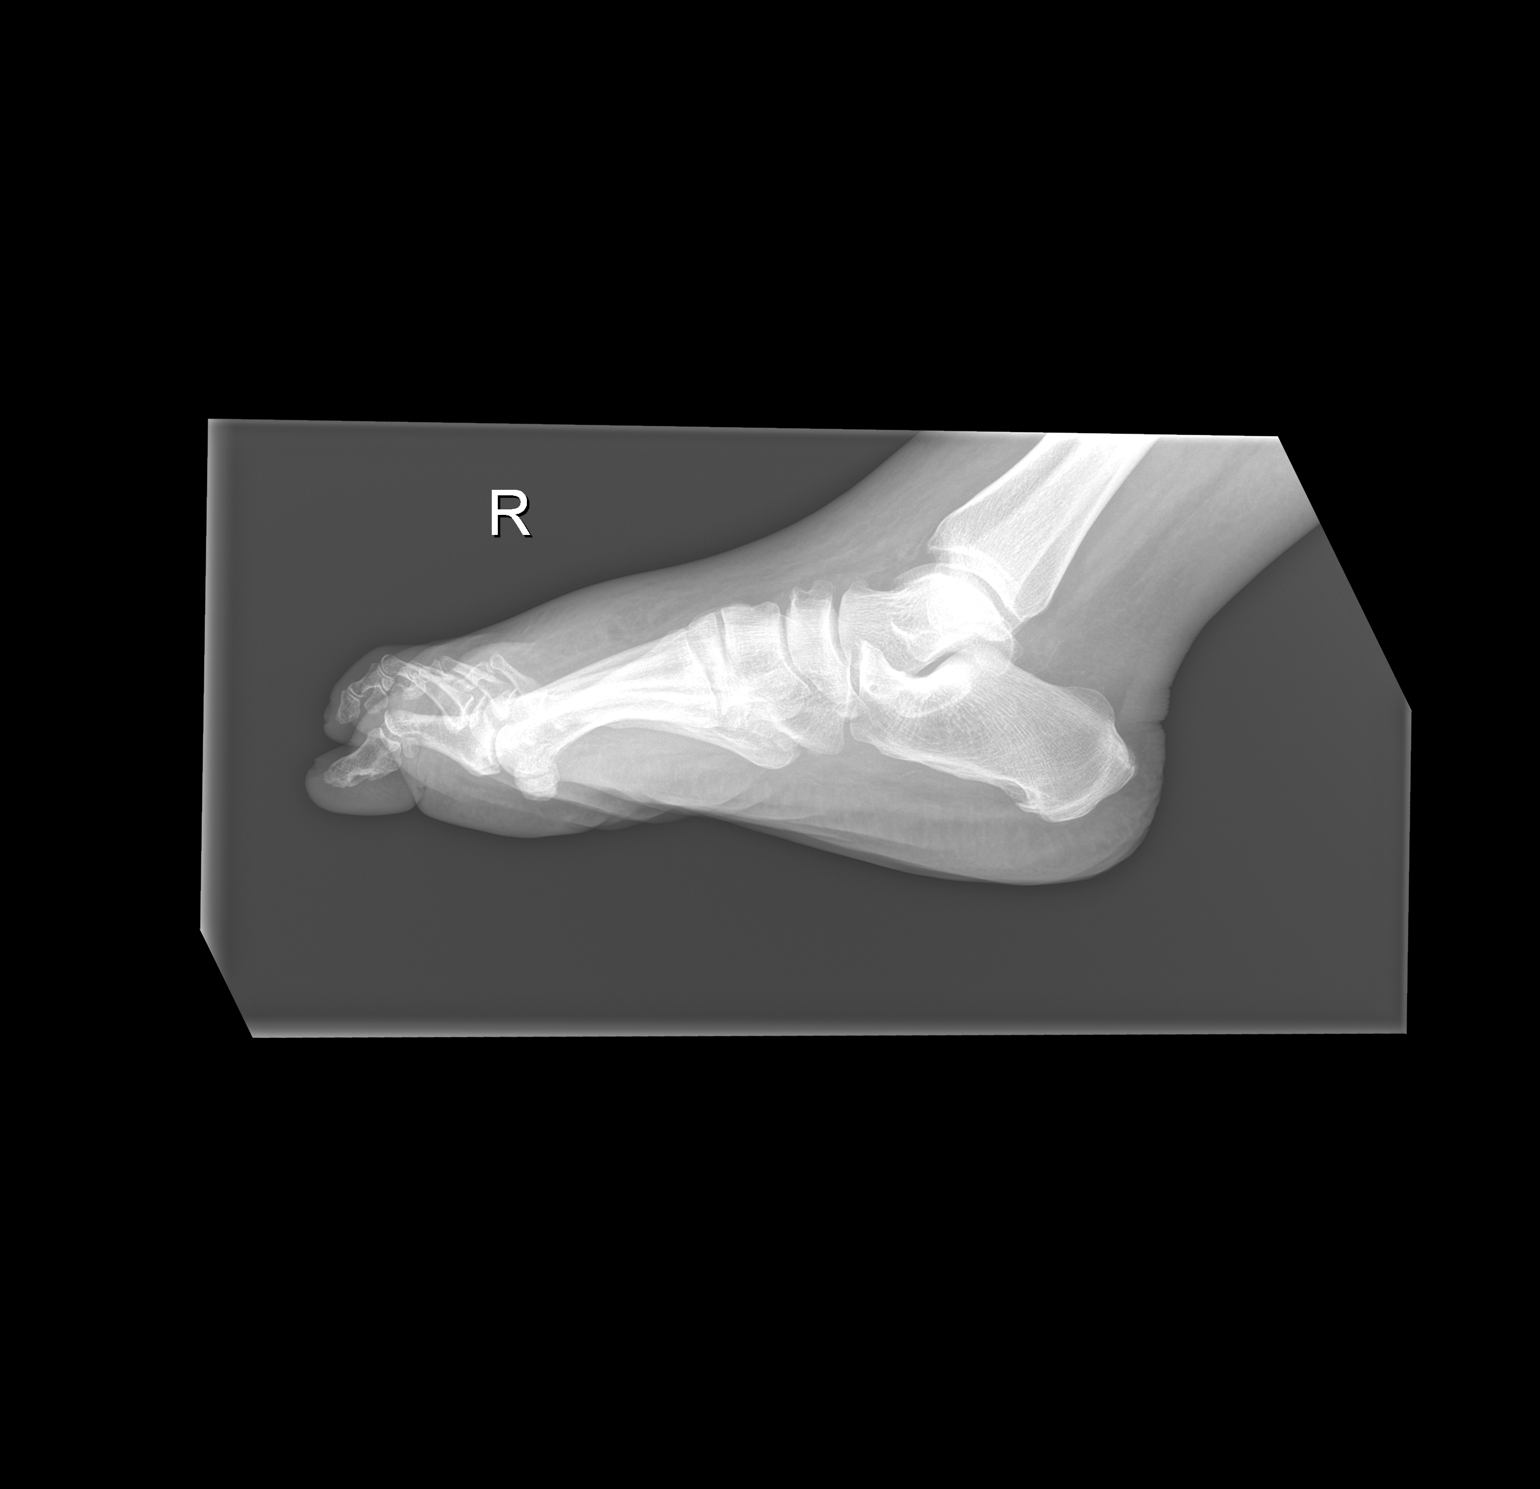

[x foot ap right]
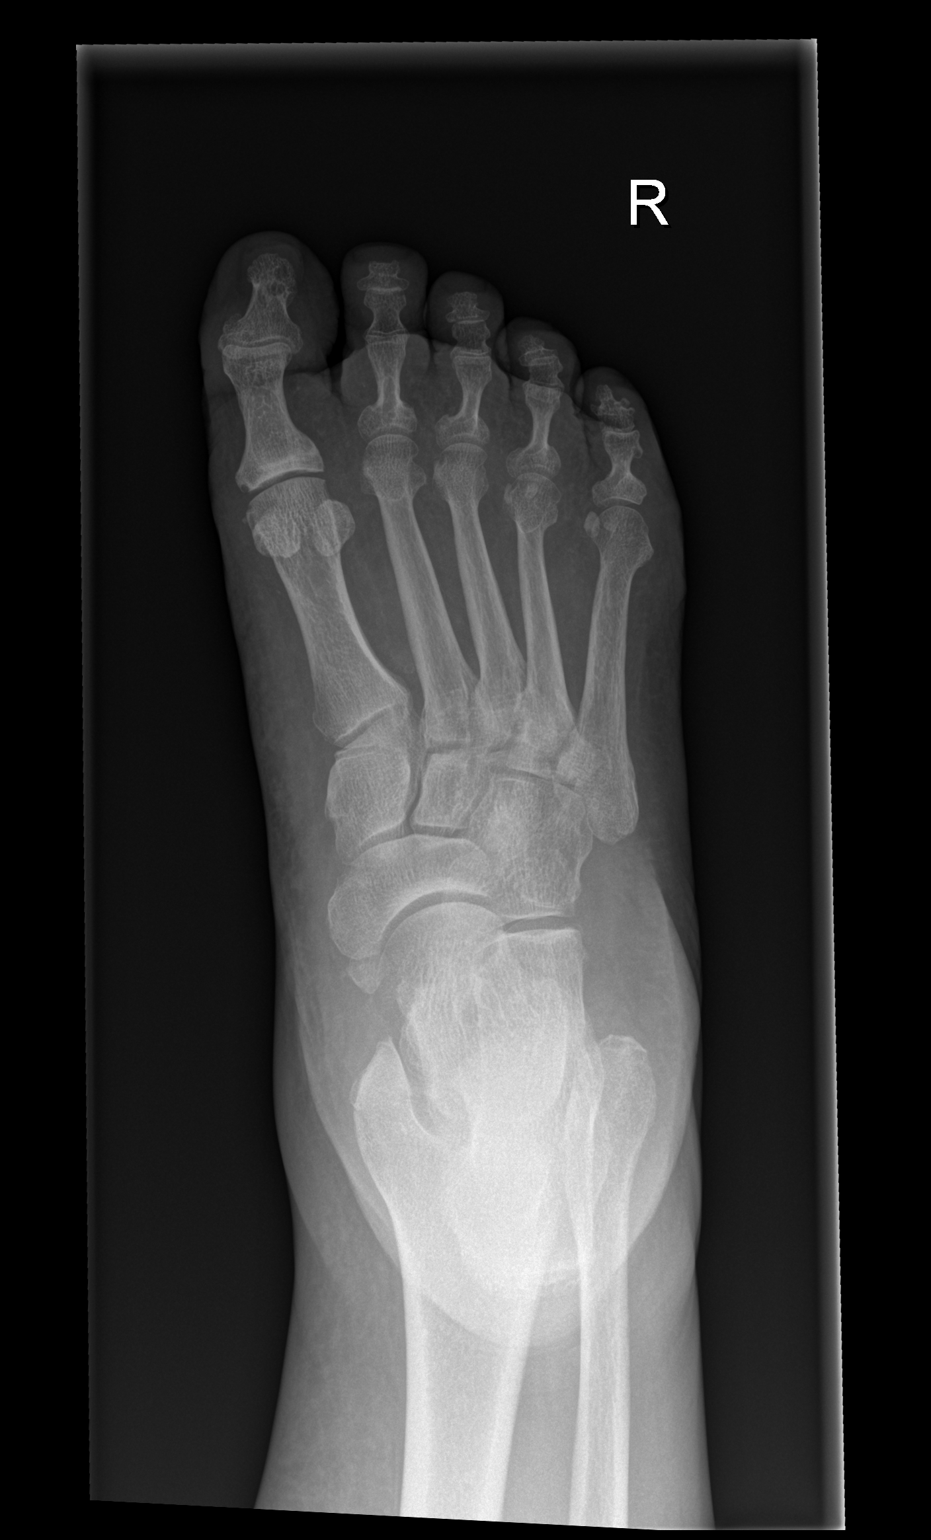

[x foot obl right]
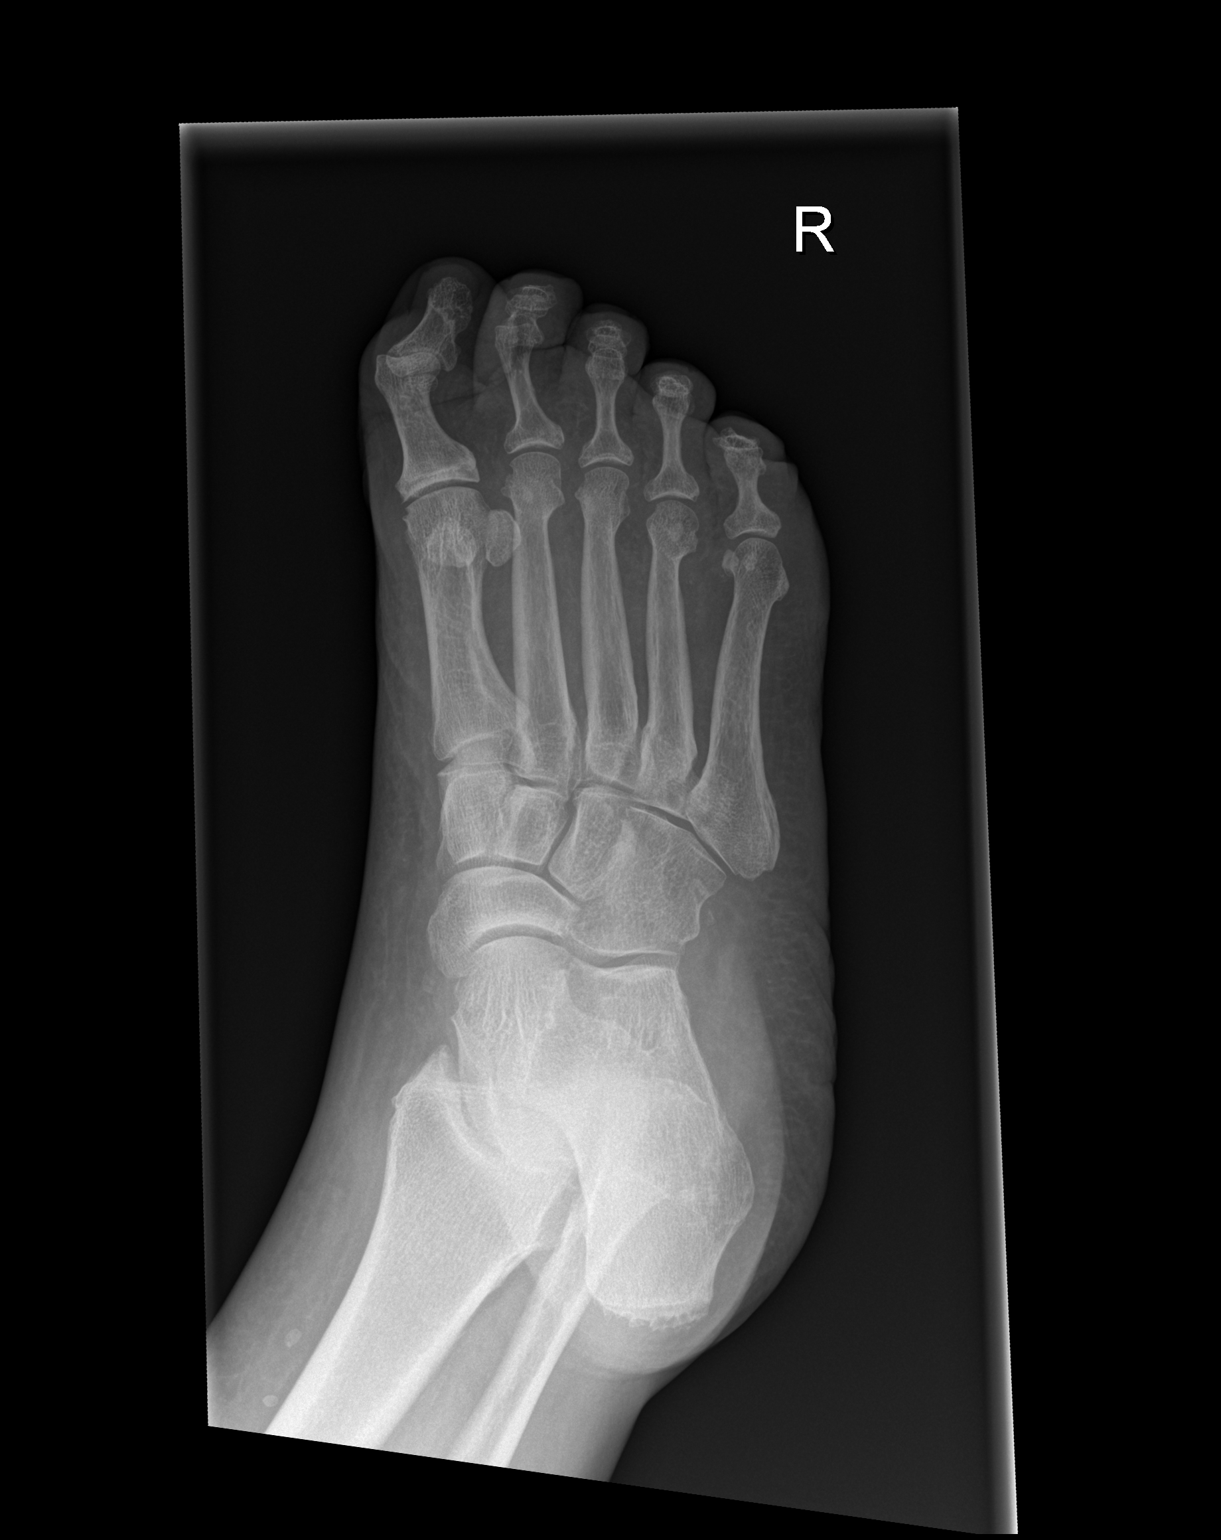

[3 of 3 positions shown; findings below may reference images not displayed]

FINDINGS: There is no evidence of fracture or dislocation. There are small
erosions seen medially at the base of the proximal phalanx of the
great toe and in the medial aspect of the distal aspect of the
first, second and third metatarsals. Generalized soft tissue
swelling of the right foot is noted.
IMPRESSION: No acute fracture or dislocation. Generalized soft tissue swelling
of the right foot.

Small erosions seen in medial aspect at the base of the first
proximal phalanx and distal aspect of the first, second and third
metatarsals. Differential possibility includes rheumatoid arthritis.

## 2017-11-17 MED ORDER — ACETAMINOPHEN 325 MG PO TABS
650.0000 mg | ORAL_TABLET | Freq: Once | ORAL | Status: AC
  Administered 2017-11-17: 650 mg via ORAL
  Filled 2017-11-17: qty 2

## 2017-11-17 MED ORDER — ACETAMINOPHEN 500 MG PO TABS: 1000.0000 mg | ORAL_TABLET | Freq: Four times a day (QID) | ORAL | 0 refills | Status: DC | PRN

## 2017-11-17 NOTE — ED Provider Notes (Addendum)
[facility] DEPT Provider Note   CSN: [id number] Arrival date & time: 11/17/17  1325     History   Chief Complaint Chief Complaint  Patient presents with  . Foot Swelling    HPI Makayla [name] is a 52 y.o. female with a history of type 2 diabetes, morbid obesity, hypertension, hyperlipidemia, gout who presents emergency department today for foot pain x1 week.  Patient reports that one week ago she started having pain in her right big toe with associated swelling and redness.  She reports that this improved after several days without treatment but is now started to have pain in her left foot.  She thinks this may be related to her gout.  She has not seen a specialist in many years for this.  She has not tried anything for symptoms.  She reports some lower extremity swelling.  Patient does walk at baseline with a cane.  She notes after long periods of standing her pain is worse.  She denies any fever, joint swelling, overlying erythema, breaks in skin, falls, trauma, chest pain, shortness of breath, cough, orthopnea or other symptoms at this time.  HPI  Past Medical History:  Diagnosis Date  . Cellulitis   . Chronic bronchitis ([place])   . CKD (chronic kidney disease), stage III ([place])   . Diabetes mellitus   . Hyperlipidemia   . Hypertension   . Neuropathy     Patient Active Problem List   Diagnosis Date Noted  . Renal failure, acute on chronic (HCC) 12/10/2012  . Hyperosmolar non-ketotic state in patient with type 2 diabetes mellitus ([place]) 12/08/2012  . Hypokalemia 12/08/2012  . ARF (acute renal failure) ([place]) 12/08/2012  . Dehydration 12/08/2012  . Obesity, morbid ([place]) 12/08/2012  . Tobacco abuse 12/08/2012  . SNORING 06/16/2009  . Diabetes mellitus without complication ([place]) 81/27/5170  . HYPERLIPIDEMIA 05/09/2009  . HYPERTENSION 05/09/2009    Past Surgical History:  Procedure Laterality Date  . TONSILLECTOMY       OB History   None       Home Medications    Prior to Admission medications   Medication Sig Start Date End Date Taking? Authorizing Provider  acetaminophen (TYLENOL) 500 MG tablet Take 1,000 mg by mouth every 6 (six) hours as needed for moderate pain.    [provider]  albuterol (PROVENTIL HFA;VENTOLIN HFA) 108 (90 BASE) MCG/ACT inhaler Inhale 2 puffs into the lungs every 6 (six) hours as needed. For shortness of breath     [provider]  allopurinol (ZYLOPRIM) 100 MG tablet Take 1 tablet (100 mg total) by mouth daily. 05/03/17   Glyn Ade, PA-C  amLODipine (NORVASC) 5 MG tablet Take 5 mg by mouth daily.    [provider]  B-D UF III MINI PEN NEEDLES 31G X 5 MM MISC See admin instructions. 04/28/15   [provider]  Butalbital-APAP-Caffeine 50-300-40 MG CAPS Take 1 tablet by mouth daily as needed for migraine. 03/13/17   [provider]  chlorthalidone (HYGROTON) 25 MG tablet Take 12.5 mg by mouth daily. 04/18/17   [provider]  cyclobenzaprine (FLEXERIL) 10 MG tablet take 1 tablet by mouth NIGHTLY AS NEEDED FOR MUSCLE SPASM 04/28/15   [provider]  cyclobenzaprine (FLEXERIL) 5 MG tablet Take 1 tablet twice a day as needed for Muscle spasms. 01/31/15   Elayne Snare, MD  diclofenac sodium (VOLTAREN) 1 % GEL Apply 2 g topically 4 (four) times daily. Patient not taking: Reported  on 05/03/2017 01/30/16   Leo Grosser, MD  gabapentin (NEURONTIN) 400 MG capsule Take 400 mg by mouth 3 (three) times daily.    [provider]  glucose blood (ONETOUCH VERIO) test strip Use as instructed to check blood sugar 2 times daily Dx code E11.9 11/03/14   Elayne Snare, MD  insulin aspart (NOVOLOG) 100 UNIT/ML injection Inject 6 Units into the skin 3 (three) times daily with meals. Patient taking differently: Inject 15 Units into the skin 3 (three) times daily with meals. 15-15-25 08/26/14   Elayne Snare, MD  Insulin Syringe-Needle U-100 (SAFETY-GLIDE  0.3CC SYR 29GX1/2) 29G X 1/2" 0.3 ML MISC Use as directed. 12/11/12   Hongalgi, Lenis Dickinson, MD  levothyroxine (SYNTHROID, LEVOTHROID) 50 MCG tablet Take 50 mcg by mouth daily. 04/28/15   [provider]  lisinopril (PRINIVIL,ZESTRIL) 10 MG tablet Take 10 mg by mouth daily. 04/18/17   [provider]  metFORMIN (GLUCOPHAGE-XR) 750 MG 24 hr tablet Take 2 tablets daily 05/06/15   Elayne Snare, MD  methocarbamol (ROBAXIN) 500 MG tablet Take 1 tablet (500 mg total) by mouth every 8 (eight) hours as needed for muscle spasms (and pain). 01/17/15   Clayton Bibles, PA-C  NEXIUM 40 MG capsule Take 40 mg by mouth daily.  06/09/14   [provider]  NOVOLIN 70/30 (70-30) 100 UNIT/ML injection inject 20 units subcutaneously three times a day [place] 04/28/15   [provider]  nystatin cream (MYCOSTATIN) Apply to affected area 2 times daily Patient not taking: Reported on 05/03/2017 01/17/15   Clayton Bibles, PA-C  predniSONE (DELTASONE) 20 MG tablet Take 2 tablets (40 mg total) by mouth daily. 05/03/17   Glyn Ade, PA-C  rosuvastatin (CRESTOR) 40 MG tablet Take 40 mg by mouth daily.     [provider]  topiramate (TOPAMAX) 50 MG tablet Take 50 mg by mouth 2 (two) times daily. 04/18/17   [provider]  TRESIBA FLEXTOUCH 200 UNIT/ML SOPN Inject 60 Units into the skin at bedtime. 01/17/15   Clayton Bibles, PA-C  UNABLE TO FIND 1. Glucometer x 1. 2. Lancets x 1 box. 3. Glucometer strips x 1 box. 12/11/12   Hongalgi, Lenis Dickinson, MD    Family History Family History  Problem Relation Age of Onset  . Diabetes Mother   . Hypertension Mother   . Hyperlipidemia Mother   . Diabetes Father   . Heart disease Father   . Diabetes Sister     Social History Social History   Tobacco Use  . Smoking status: Current Every Day Smoker    Packs/day: 0.10    Years: 23.00    Pack years: 2.30    Types: Cigarettes  . Smokeless tobacco: Never Used  Substance Use Topics  .  Alcohol use: Yes    Comment: Occasional use: 4 drinks per occasion, once a month  . Drug use: No     Allergies   Ibuprofen; Peach flavor; Penicillins; and Strawberry extract   Review of Systems Review of Systems  All other systems reviewed and are negative.    Physical Exam Updated Vital Signs BP (!) 103/52   Pulse (!) 43   Temp 98.4 F (36.9 C) (Oral)   Resp 18   Ht 5\' 4"  (1.626 m)   Wt (!) 181.4 kg   LMP 12/08/2012   SpO2 100%   BMI 68.66 kg/m   Physical Exam  Constitutional: She appears well-developed and well-nourished.  HENT:  Head: Normocephalic and atraumatic.  Right Ear: External ear normal.  Left Ear: External ear normal.  Nose: Nose normal.  Mouth/Throat: Uvula is midline, oropharynx is clear and moist and mucous membranes are normal. No tonsillar exudate.  Eyes: Pupils are equal, round, and reactive to light. Right eye exhibits no discharge. Left eye exhibits no discharge. No scleral icterus.  Neck: Trachea normal. Neck supple. No spinous process tenderness present. No neck rigidity. Normal range of motion present.  Cardiovascular: Normal rate, regular rhythm and intact distal pulses.  No murmur heard. Pulses:      Radial pulses are 2+ on the right side, and 2+ on the left side.  Trace bilateral pedal edema. Calves are symmmetric in size b/l. No calf tenderness. Negative homans sign. Doppler pulses are intact for PT and DP b/l.   Pulmonary/Chest: Effort normal and breath sounds normal. She exhibits no tenderness.  Abdominal: Soft. Bowel sounds are normal. There is no tenderness. There is no rebound and no guarding.  Musculoskeletal: She exhibits no edema.       Right ankle: Normal.       Left ankle: Normal.       Feet:  No joint swelling, erythema or heat. Skin is intact. Compartments are soft. Passive rom of all digits, and b/l ankles without difficulty, pain or limitation.   Lymphadenopathy:    She has no cervical adenopathy.  Neurological: She is  alert. She has normal strength. No sensory deficit.  Reflex Scores:      Achilles reflexes are 2+ on the right side and 2+ on the left side. No foot drop.   Skin: Skin is warm and dry. No rash noted. She is not diaphoretic. No erythema.  Psychiatric: She has a normal mood and affect.  Nursing note and vitals reviewed.    ED Treatments / Results  Labs (all labs ordered are listed, but only abnormal results are displayed) Labs Reviewed - No data to display  EKG None  Radiology No results found.  Procedures Procedures (including critical care time)  Medications Ordered in ED Medications  acetaminophen (TYLENOL) tablet 650 mg (650 mg Oral Given 11/17/17 1525)     Initial Impression / Assessment and Plan / ED Course  I have reviewed the triage vital signs and the nursing notes.  Pertinent labs & imaging results that were available during my care of the patient were reviewed by me and considered in my medical decision making (see chart for details).     52 y.o. female with bilateral feet pain. Trace b/l pedal edema.  No calf tenderness bilaterally.  Calves are equal in size bilaterally.  She denies any chest pain or shortness of breath.  No cough.  Exam is not consistent with gout or septic joint.  Vital signs are reassuring.  X-rays are reassuring.  Will recommend Tylenol, and elevation.  She is to follow with PCP or orthopedics.  Strict return precautions discussed.  Patient appears safe for discharge.  Patient case seen and discussed with Dr. Ralene Bathe who is in agreement with plan.   Final Clinical Impressions(s) / ED Diagnoses   Final diagnoses:  Pain in both feet    ED Discharge Orders         Ordered    acetaminophen (TYLENOL) 500 MG tablet  Every 6 hours PRN     11/17/17 1604           Lorelle Gibbs 11/17/17 1605    Jillyn Ledger, PA-C 11/17/17 1616    Quintella Reichert,  MD 11/20/17 1432

## 2017-11-17 NOTE — ED Triage Notes (Signed)
Pt reports bilateral feet swelling and pain x 2-3 days. Hx gout to right foot, believes it has gone to left foot. Denies chest pain and shortness of breath.

## 2017-11-17 NOTE — Discharge Instructions (Signed)
Your xrays did not show evidence of fracture dislocation. Your exam is not consistent with gout Please take 1000mg  of tylenol (acetaminophen) every 6 hours as needed for pain.  Do not drink alcohol while taking this medication.  Please read labels to ensure other medications and I have Tylenol in it.  Do not take more than 4000 mg of Tylenol in a 24 period.  Please elevate your legs above the level of your heart.  Return for fever, redness of your feet, increased swelling of your legs, chest pain, shortness of breath or other new concerning symptoms/worsening of your symptoms .

## 2017-12-10 ENCOUNTER — Other Ambulatory Visit: Payer: Self-pay | Admitting: Cardiology

## 2017-12-10 DIAGNOSIS — Z6841 Body Mass Index (BMI) 40.0 and over, adult: Secondary | ICD-10-CM

## 2017-12-10 DIAGNOSIS — R079 Chest pain, unspecified: Secondary | ICD-10-CM

## 2017-12-10 DIAGNOSIS — I10 Essential (primary) hypertension: Secondary | ICD-10-CM

## 2017-12-11 ENCOUNTER — Other Ambulatory Visit: Payer: Self-pay | Admitting: Cardiology

## 2017-12-11 DIAGNOSIS — I10 Essential (primary) hypertension: Secondary | ICD-10-CM

## 2017-12-13 ENCOUNTER — Ambulatory Visit (HOSPITAL_COMMUNITY)
Admission: RE | Admit: 2017-12-13 | Discharge: 2017-12-13 | Disposition: A | Discharge status: Home / Self Care | Payer: Medicare Other | Source: Ambulatory Visit | Attending: Nurse Practitioner | Admitting: Nurse Practitioner

## 2017-12-13 DIAGNOSIS — Z6841 Body Mass Index (BMI) 40.0 and over, adult: Secondary | ICD-10-CM | POA: Diagnosis not present

## 2017-12-13 DIAGNOSIS — I10 Essential (primary) hypertension: Secondary | ICD-10-CM | POA: Diagnosis present

## 2017-12-13 DIAGNOSIS — Z01818 Encounter for other preprocedural examination: Secondary | ICD-10-CM | POA: Insufficient documentation

## 2017-12-13 NOTE — Progress Notes (Signed)
  Echocardiogram 2D Echocardiogram has been performed.  Makayla [name] F 12/13/2017, 9:02 AM

## 2017-12-19 ENCOUNTER — Encounter (HOSPITAL_COMMUNITY)
Admission: RE | Admit: 2017-12-19 | Discharge: 2017-12-19 | Disposition: A | Discharge status: Home / Self Care | Payer: Medicare Other | Source: Ambulatory Visit | Attending: Cardiology | Admitting: Cardiology

## 2017-12-19 DIAGNOSIS — I10 Essential (primary) hypertension: Secondary | ICD-10-CM

## 2017-12-19 DIAGNOSIS — Z6841 Body Mass Index (BMI) 40.0 and over, adult: Secondary | ICD-10-CM | POA: Diagnosis present

## 2017-12-19 DIAGNOSIS — E66813 Obesity, class 3: Secondary | ICD-10-CM

## 2017-12-19 DIAGNOSIS — R079 Chest pain, unspecified: Secondary | ICD-10-CM

## 2017-12-19 MED ORDER — REGADENOSON 0.4 MG/5ML IV SOLN
0.4000 mg | Freq: Once | INTRAVENOUS | Status: AC
  Administered 2017-12-19: 0.4 mg via INTRAVENOUS

## 2017-12-19 MED ORDER — TECHNETIUM TC 99M TETROFOSMIN IV KIT
30.0000 | PACK | Freq: Once | INTRAVENOUS | Status: AC | PRN
  Administered 2017-12-19: 30 via INTRAVENOUS

## 2017-12-19 MED ORDER — REGADENOSON 0.4 MG/5ML IV SOLN
INTRAVENOUS | Status: AC
  Filled 2017-12-19: qty 5

## 2017-12-20 ENCOUNTER — Encounter (HOSPITAL_COMMUNITY)
Admission: RE | Admit: 2017-12-20 | Discharge: 2017-12-20 | Disposition: A | Discharge status: Home / Self Care | Payer: Medicare Other | Source: Ambulatory Visit | Attending: Cardiology | Admitting: Cardiology

## 2017-12-20 MED ORDER — TECHNETIUM TC 99M TETROFOSMIN IV KIT
30.0000 | PACK | Freq: Once | INTRAVENOUS | Status: AC | PRN
  Administered 2017-12-20: 30 via INTRAVENOUS

## 2018-09-09 ENCOUNTER — Ambulatory Visit: Payer: Medicare Other | Admitting: Registered"

## 2018-09-11 ENCOUNTER — Encounter: Payer: Medicare Other | Attending: Nurse Practitioner | Admitting: Registered"

## 2018-09-11 ENCOUNTER — Other Ambulatory Visit: Payer: Self-pay

## 2018-09-11 ENCOUNTER — Encounter: Payer: Self-pay | Admitting: Registered"

## 2018-09-11 DIAGNOSIS — E119 Type 2 diabetes mellitus without complications: Secondary | ICD-10-CM | POA: Insufficient documentation

## 2018-09-11 NOTE — Patient Instructions (Addendum)
Instructions/Goals:   Have consistent meals:   Eat every 3-5 hours  Have balanced meals (see plate handout)  Include 2-3 carb choices per meal (30-45 g carbohydrates per meal)  Check blood sugar regularly:  Check fasting each day and 1-2 times 1-2 hours after meals (postprandial 1-2 times per day)  Write down blood sugar and when it was taken (fasting or 1-2 hours after meal)  Continue working to quit smoking

## 2018-09-11 NOTE — Progress Notes (Signed)
Diabetes Self-Management Education  Visit Type: First/Initial  Appt. Start Time: 1400 Appt. End Time: 1500  [name], identified by name and date of birth, is a 53 y.o. female with a diagnosis of Diabetes: Type 2.   ASSESSMENT  Pt present for appointment with older sister. Older sister is a retired Marine scientist and reports she is here to assist pt. Pt reports her primary concern is to lose weight. Pt reports she used to drink a lot of Pepsi, but has stopped. Reports she smokes, but is trying to quit. Has Nicotine Patch. Pt reports she has been checking blood sugar 1 time per day. Reports she needs to get a new glucose monitor. Reports her fasting blood sugar over past week has been ~89-90. Reports that the highest blood sugar over past week was ~160. At end of visit pt's sister inquired about whether appointment would be needed/fill requirement for pt to have liposuction surgery.  Food Allergies/Intolerances: strawberries and peaches: swelling/itching. Lactose intolerant. Reports tomatoes cause acid reflux.   Height 5' 4.5" (1.638 m), weight (!) 344 lb (156 kg), last menstrual period 12/08/2012. Body mass index is 58.14 kg/m.  Diabetes Self-Management Education - 09/11/18 1430      Visit Information   Visit Type  First/Initial      Initial Visit   Diabetes Type  Type 2    Are you currently following a meal plan?  No    Are you taking your medications as prescribed?  Yes    Date Diagnosed  [place]   How would you rate your overall health?  Fair      Psychosocial Assessment   Patient Belief/Attitude about Diabetes  Motivated to manage diabetes    Self-care barriers  Unable to determine    Self-management support  Family    Other persons present  Family Member    Patient Concerns  Weight Control    Special Needs  Unable to determine    Preferred Learning Style  No preference indicated    Learning Readiness  Ready    How often do you need to have someone help you  when you read instructions, pamphlets, or other written materials from your doctor or pharmacy?  3 - Sometimes    What is the last grade level you completed in school?  12th      Pre-Education Assessment   Patient understands the diabetes disease and treatment process.  Needs Instruction    Patient understands incorporating nutritional management into lifestyle.  Needs Instruction    Patient undertands incorporating physical activity into lifestyle.  Needs Instruction    Patient understands using medications safely.  Demonstrates understanding / competency    Patient understands monitoring blood glucose, interpreting and using results  Needs Instruction    Patient understands prevention, detection, and treatment of acute complications.  Needs Instruction    Patient understands prevention, detection, and treatment of chronic complications.  Needs Instruction    Patient understands how to develop strategies to address psychosocial issues.  Needs Instruction    Patient understands how to develop strategies to promote health/change behavior.  Needs Instruction      Complications   Last HgB A1C per patient/outside source  --   Pt did not know.    How often do you check your blood sugar?  1-2 times/day    Fasting Blood glucose range (mg/dL)  70-129    Postprandial Blood glucose range (mg/dL)  --   Does not  check after meals.    Number of hypoglycemic episodes per month  0    Number of hyperglycemic episodes per week  0    Have you had a dilated eye exam in the past 12 months?  No    Have you had a dental exam in the past 12 months?  No    Are you checking your feet?  Yes    How many days per week are you checking your feet?  2      Dietary Intake   Breakfast  (9AM) 1 piece of shrimp, half biscuit, water     Lunch  (12 noon) applesauce, water    Dinner  (6PM) baked chicken, broccoli and cheese, potato salad, water    Beverage(s)  32-48 oz water      Exercise   Exercise Type  ADL's    How  many days per week to you exercise?  0    How many minutes per day do you exercise?  0    Total minutes per week of exercise  0      Patient Education   Previous Diabetes Education  No    Disease state   Definition of diabetes, type 1 and 2, and the diagnosis of diabetes;Factors that contribute to the development of diabetes    Nutrition management   Role of diet in the treatment of diabetes and the relationship between the three main macronutrients and blood glucose level;Carbohydrate counting;Food label reading, portion sizes and measuring food.    Physical activity and exercise   Role of exercise on diabetes management, blood pressure control and cardiac health.    Monitoring  Purpose and frequency of SMBG.;Interpreting lab values - A1C, lipid, urine microalbumina.;Yearly dilated eye exam;Daily foot exams;Identified appropriate SMBG and/or A1C goals.    Acute complications  Taught treatment of hypoglycemia - the 15 rule.    Chronic complications  Relationship between chronic complications and blood glucose control;Assessed and discussed foot care and prevention of foot problems;Retinopathy and reason for yearly dilated eye exams;Reviewed with patient heart disease, higher risk of, and prevention;Nephropathy, what it is, prevention of, the use of ACE, ARB's and early detection of through urine microalbumia.;Dental care;Lipid levels, blood glucose control and heart disease    Psychosocial adjustment  Role of stress on diabetes      Individualized Goals (developed by patient)   Nutrition  Follow meal plan discussed;General guidelines for healthy choices and portions discussed    Medications  take my medication as prescribed    Monitoring   test my blood glucose as discussed    Reducing Risk  examine blood glucose patterns;stop smoking;do foot checks daily;treat hypoglycemia with 15 grams of carbs if blood glucose less than 70mg /dL      Post-Education Assessment   Patient understands the diabetes  disease and treatment process.  Demonstrates understanding / competency    Patient understands incorporating nutritional management into lifestyle.  Demonstrates understanding / competency    Patient undertands incorporating physical activity into lifestyle.  Demonstrates understanding / competency    Patient understands using medications safely.  Demonstrates understanding / competency    Patient understands monitoring blood glucose, interpreting and using results  Demonstrates understanding / competency    Patient understands prevention, detection, and treatment of acute complications.  Demonstrates understanding / competency    Patient understands prevention, detection, and treatment of chronic complications.  Demonstrates understanding / competency    Patient understands how to develop strategies to address psychosocial issues.  Demonstrates understanding / competency    Patient understands how to develop strategies to promote health/change behavior.  Demonstrates understanding / competency      Outcomes   Expected Outcomes  Demonstrated interest in learning. Expect positive outcomes    Future DMSE  4-6 wks    Program Status  Completed       Individualized Plan for Diabetes Self-Management Training:   Learning Objective:  Patient will have a greater understanding of diabetes self-management. Patient education plan is to attend individual and/or group sessions per assessed needs and concerns.   Plan: Have consistent meals:   Eat every 3-5 hours  Have balanced meals (see plate handout)  Include 2-3 carb choices per meal (30-45 g carbohydrates per meal)  Check blood sugar regularly:  Check fasting each day and 1-2 times 1-2 hours after meals (postprandial 1-2 times per day)  Write down blood sugar and when it was taken (fasting or 1-2 hours after meal)  Continue working to quit smoking   Patient Instructions  Instructions/Goals:   Have consistent meals:   Eat every 3-5  hours  Have balanced meals (see plate handout)  Include 2-3 carb choices per meal (30-45 g carbohydrates per meal)  Check blood sugar regularly:  Check fasting each day and 1-2 times 1-2 hours after meals (postprandial 1-2 times daily)  Write down blood sugar and when it was taken (fasting or 1-2 hours after meal)  Continue working to quit smoking  Expected Outcomes:  Demonstrated interest in learning. Expect positive outcomes  Education material provided: ADA - How to Thrive: A Guide for Your Journey with Diabetes, Meal plan card and My Plate  If problems or questions, patient to contact team via:  Phone and Email  Future DSME appointment: 4-6 wks

## 2018-09-18 ENCOUNTER — Other Ambulatory Visit: Payer: Self-pay

## 2018-09-23 ENCOUNTER — Other Ambulatory Visit: Payer: Self-pay | Admitting: Cardiology

## 2018-10-16 ENCOUNTER — Other Ambulatory Visit: Payer: Self-pay

## 2018-10-16 ENCOUNTER — Encounter: Payer: Medicare Other | Attending: Nurse Practitioner | Admitting: Registered"

## 2018-10-16 DIAGNOSIS — E119 Type 2 diabetes mellitus without complications: Secondary | ICD-10-CM | POA: Diagnosis present

## 2018-10-16 NOTE — Patient Instructions (Addendum)
Instructions/Goals:   Have consistent meals:   Eat every 3-5 hours  Have balanced meals   Include 2-3 carb choices per meal (30-45 g carbohydrates per meal)  If having a low appetite, recommend including a serving of carbohydrate and protein even if snack sized to help keep blood sugar balanced.   Great job checking blood sugar regularly.   Continue checking fasting blood sugar and 1-2 times 1-2 hours after meals  Recommend checking into water aerobics at [facility] (as some locations are offering adapted water fitness and swimming by appointment) and including chair exercises as able. Recommend starting slow and gradually increasing minutes per week. Recommend consulting your doctor with any questions regarding whether a new physical activity is appropriate.

## 2018-10-16 NOTE — Progress Notes (Signed)
Diabetes Self-Management Education  Visit Type:    Appt. Start Time: 1410 Appt. End Time: 1440  [name], identified by name and date of birth, is a 53 y.o. female with a diagnosis of Diabetes:     ASSESSMENT   Pt present for appointment alone. Reports blood sugar has not been over 120 since last appointment. Fasting has been between 94-110, after meals has been around 96-120 per pt. No GI concerns. Reports she has been drinking tea sweetened with artificial sweeteners. Continues to take medications as prescribed. Reports reduced appetite since about 1.5 months ago. Reports feeling uninterested in eating at times. Reports skipping a meal due to reduced appetite about 2 days out of the week.    Pt reports she really wants to lose weight and wants to look into doing water aerobics at the [facility].   Food Allergies/Intolerances: strawberries and peaches: swelling/itching. Lactose intolerant. Reports tomatoes cause acid reflux.   Last menstrual period 12/08/2012. There is no height or weight on file to calculate BMI.  24 hour Recall  Breakfast: cereal, 1/2 cup coffee with artificial sweetener, creamer Snk: None reported.  Lunch:  small bag of sour cream and onion potato chips, water  Dinner: BBQ chicken, potato salad, broccoli and cheese, water  Snk: None reported.  Beverages: about 2 bottles of water; 1/2 cup coffee  Individualized Plan for Diabetes Self-Management Training:   Learning Objective:  Patient will have a greater understanding of diabetes self-management. Patient education plan is to attend individual and/or group sessions per assessed needs and concerns.  Plan:  -Dietitian discussed goals for blood sugar. From report, pt's blood sugar has been within range since last visit. Discussed if having low appetite to try to still include a carbohydrate and protein source to help keep blood sugar balanced. Encouraged pt to continue checking blood sugar regularly. Encouraged pt  to look into what water aerobics classes are being offered at [facility] currently. Discussed sending chair exercise handouts to pt as well.     Instructions/Goals:   Have consistent meals:   Eat every 3-5 hours  Have balanced meals   Include 2-3 carb choices per meal (30-45 g carbohydrates per meal)  If having a low appetite, recommend including a serving of carbohydrate and protein even if snack sized to help keep blood sugar balanced.   Great job checking blood sugar regularly.   Continue checking fasting blood sugar and 1-2 times 1-2 hours after meals  Recommend checking into water aerobics at [facility] and including chair exercises as able. Recommend starting slow and gradually increasing minutes per week. Recommend consulting your doctor with any questions regarding whether a new physical activity is appropriate.    Education material provided: snack sheet.   If problems or questions, patient to contact team via:  Phone and Email  Future DSME appointment:  1 month.

## 2018-10-22 ENCOUNTER — Telehealth: Payer: Self-pay | Admitting: Registered"

## 2018-10-22 NOTE — Telephone Encounter (Signed)
ADA Exercise handouts mailed to pt as discussed at last visit on 10/16/2018.

## 2018-11-19 ENCOUNTER — Encounter: Payer: Self-pay | Admitting: Registered"

## 2018-11-19 ENCOUNTER — Encounter: Payer: Medicare Other | Attending: Nurse Practitioner | Admitting: Registered"

## 2018-11-19 ENCOUNTER — Other Ambulatory Visit: Payer: Self-pay

## 2018-11-19 DIAGNOSIS — E119 Type 2 diabetes mellitus without complications: Secondary | ICD-10-CM | POA: Diagnosis present

## 2018-11-19 NOTE — Progress Notes (Signed)
Diabetes Self-Management Education  Visit Type:    Appt. Start Time: 1405 Appt. End Time: [id number]  Makayla [name], identified by name and date of birth, is a 53 y.o. female with a diagnosis of Diabetes:     ASSESSMENT  DSME Follow-Up Appointment: Pt present for appointment alone. Pt reports things have been going well. Reports she has been exercising more in the home doing cleaning. Reports her hip has been acting up since she started being more active. Reports blood sugar is going well. Reports it was 89 fasting this morning. Pt has not been checking after meals. Reports having migraines, reports this is typical for her. Reports having normal blood sugar during migraines (pt has checked it during that time). Reports she has been including more vegetables and has been trying to eat regularly/not skip meals even if not hungry to help manage diabetes. Reports she prepared food using one of the recipes in the ADA book she received at her first appointment and really liked it. Reports now getting in breakfast, lunch, and dinner. Reports she cut out chips and is working on quitting cigarettes. Reports she will be going to see the nephrologist tomorrow for f/u.   Pt reports she has not yet called the [facility] but is still wanting to do water aerobics.   Food Allergies/Intolerances: strawberries and peaches: swelling/itching. Lactose intolerant. Reports tomatoes cause acid reflux.   Last menstrual period 12/08/2012. Body mass index is 61.62 kg/m.  24 hour Recall: Breakfast: small piece sausage, small amount of grits, coffee with artificial sugar, creamer Snk: None reported.  Lunch:  1 hot dog, 4 mini hot dogs wrapped in bread, ranch dressing, water Dinner: baked chicken, garlic mashed potatoes, green beans Snk: None reported.  Beverages: 2 bottles of water; coffee  Individualized Plan for Diabetes Self-Management Training:   Plan:  -Dietitian praised pt's progress with having more consistent eating  pattern, including more vegetables, and working to quit smoking. Encouraged checking blood sugar postprandially 1 time daily as well as fasting and brining log of BG reading to next appointment. Encouraged pt to increase water intake. Reviewed balanced nutrition and consistent carbohydrate diet. Provided website for additional ADA recipes. Encouraged pt to call [facility] nearest to her to learn about their water aerobics options during covid as many have been offering distanced and private versions of classes. Pt appeared agreeable to information/goals discussed.      Instructions/Goals:  Have consistent meals:   Continue to eat every 3-5 hours  Have balanced meals   Include 2-3 carb choices per meal (30-45 g carbohydrates per meal)  If having a low appetite, recommend including a serving of carbohydrate and protein even if snack sized to help keep blood sugar balanced.   Work to include at least 3 bottles of water per day. Ultimate goal: 64 oz daily   [email or web address]   Continue checking fasting blood sugar. Recommend also checking 1-2 hours after a meal at least once daily.   Record time and reading of blood sugar and bring to appointments.   Recommend checking into water aerobics at [facility] and including chair exercises as able. Recommend starting slow and gradually increasing minutes per week. Recommend consulting your doctor with any questions regarding whether a new physical activity is appropriate.    Continue working to gradually include more physical activity.    If problems or questions, patient to contact team via:  Phone and Email  Future DSME appointment:  1 month.

## 2018-11-19 NOTE — Patient Instructions (Addendum)
Instructions/Goals:  Have consistent meals:   Continue to eat every 3-5 hours  Have balanced meals   Include 2-3 carb choices per meal (30-45 g carbohydrates per meal)  If having a low appetite, recommend including a serving of carbohydrate and protein even if snack sized to help keep blood sugar balanced.   Work to include at least 3 bottles of water per day. Ultimate goal: 64 oz daily   [email or web address]   Continue checking fasting blood sugar. Recommend also checking 1-2 hours after a meal at least once daily.   Record time and reading of blood sugar and bring to appointments.   Recommend checking into water aerobics at [facility] and including chair exercises as able. Recommend starting slow and gradually increasing minutes per week. Recommend consulting your doctor with any questions regarding whether a new physical activity is appropriate.    Continue working to gradually include more physical activity.

## 2018-12-24 ENCOUNTER — Encounter: Payer: Self-pay | Admitting: Registered"

## 2018-12-24 ENCOUNTER — Encounter: Payer: Medicare Other | Attending: Nurse Practitioner | Admitting: Registered"

## 2018-12-24 ENCOUNTER — Other Ambulatory Visit: Payer: Self-pay

## 2018-12-24 DIAGNOSIS — E119 Type 2 diabetes mellitus without complications: Secondary | ICD-10-CM | POA: Diagnosis not present

## 2018-12-24 NOTE — Progress Notes (Signed)
Diabetes Self-Management Education  Visit Type:    Appt. Start Time: 0955 Appt. End Time: 41  [name], identified by name and date of birth, is a 54 y.o. female with a diagnosis of Diabetes:     ASSESSMENT  DSME Follow-Up Appointment: Pt present for appointment alone. Pt reports things have been going well apart from soreness in left side of hip. Reports no soda, no cigarettes, and has not been eating anything she is not supposed to eat since last appointment per pt. Reports going off of cigarettes was hard. Reports some of her family told her she would not be able to quit and that made her upset and encouraged her to stop smoking.   Pt reports she has been including more fruit. Reports having some stomach issues-reports some diarrhea, but reports it has been usual for her since she was dx with DM. Reports things have been going well with blood sugar since eating better. Reports getting in 2 meals and snack as a lunch most days. Reports that fasting blood sugar varies depending on how she feels. Reports if she is feeling well it is between 76-90, reports if it is raining out and she is hurting/having a migraine, blood sugar will be between 126-200. Pt did not have log with her. Pt denies any signs of hypoglycemia.   Food Allergies/Intolerances: strawberries and peaches: swelling/itching. Lactose intolerant. Reports tomatoes cause acid reflux.   Last menstrual period 12/08/2012. Body mass index is 58.17 kg/m.  24 hour Recall: Breakfast (8AM): [employer] vegetable soup, orange juice (1/2 cup) Snk: None reported.  Lunch:  None reported.  Dinner (6PM): bbq chicken, collard greens, piece of corn bread, water  Snk: None reported.  Beverages: 1/2 cup orange juice; 1 bottle of water (16 oz)   Pt reports she walks to the mailbox and trash can from her home. Reports no changes in physical activity since last visit. Reports she called the [facility] to look into water aerobics but they have not  yet called her back.   Individualized Plan for Diabetes Self-Management Training:   Plan:  - Dietitian praised pt for changes reported including pt report that she has stopped smoking. Dietitian discussed eating every 3-5 hours, even if only having a snack portion. Discussed if having a snack in place of a meal-want to ensure it includes a carbohydrate and protein serving-discussed balanced snack ideas within pt's preferences. Discussed checking postprandial blood sugar at least some days to provide information about how foods are affecting blood sugar. Encouraged pt to increase water to 3-4 x 16 oz water bottles per day. Encouraged pt to f/u with [facility] regarding water aerobics. Pt appeared agreeable to information/goals discussed.     Instructions/Goals:  Have consistent meals:   Continue trying to eat every 3-5 hours  Have balanced meals   Include 2-3 carb choices per meal (30-45 g carbohydrates per meal)  If having a low appetite, recommend including a serving of carbohydrate and protein even if snack sized to help keep blood sugar balanced. Examples: chicken salad and whole grain low sodium crackers OR [place] yogurt OR boiled egg(s) with whole grain toast OR crackers OR cheese and a piece of fruit (see handout).   Work to include at least 3 bottles of water per day. Ultimate goal: 64 oz daily    Continue checking fasting blood sugar. Recommend also checking 1-2 hours after a meal at least once daily.   Record time and reading of blood sugar and bring to appointments.  Recommend following up with [facility] about water aerobics and including chair exercises as able. Recommend starting slow and gradually increasing minutes per week. Recommend consulting your doctor with any questions regarding whether a new physical activity is appropriate.     If problems or questions, patient to contact team via:  Phone and Email  Future DSME appointment:  1 month.

## 2018-12-24 NOTE — Patient Instructions (Addendum)
Instructions/Goals:  Have consistent meals:   Continue trying to eat every 3-5 hours  Have balanced meals   Include 2-3 carb choices per meal (30-45 g carbohydrates per meal)  If having a low appetite, recommend including a serving of carbohydrate and protein even if snack sized to help keep blood sugar balanced. Examples: chicken salad and whole grain low sodium crackers OR [place] yogurt OR boiled egg(s) with whole grain toast OR crackers OR cheese and a piece of fruit (see handout).   Work to include at least 3 bottles of water per day. Ultimate goal: 64 oz daily    Continue checking fasting blood sugar. Recommend also checking 1-2 hours after a meal at least once daily.   Record time and reading of blood sugar and bring to appointments.   Recommend following up with [facility] about water aerobics and including chair exercises as able. Recommend starting slow and gradually increasing minutes per week. Recommend consulting your doctor with any questions regarding whether a new physical activity is appropriate.

## 2019-01-28 ENCOUNTER — Encounter: Payer: Self-pay | Admitting: Registered"

## 2019-01-28 ENCOUNTER — Other Ambulatory Visit: Payer: Self-pay

## 2019-01-28 ENCOUNTER — Encounter: Payer: Medicare Other | Attending: Nurse Practitioner | Admitting: Registered"

## 2019-01-28 DIAGNOSIS — E119 Type 2 diabetes mellitus without complications: Secondary | ICD-10-CM | POA: Insufficient documentation

## 2019-01-28 NOTE — Patient Instructions (Addendum)
Instructions/Goals:  Have consistent meals:   Continue trying to eat every 3-5 hours  Have balanced meals   Include 2-3 carb choices per meal (30-45 g carbohydrates per meal)  Recommend trying a lactose free milk: can try Lactaid milk or soy milk   Water goal: 64 oz per day: 8 cups or 4 x 16 oz bottles. Recommend starting with adding in 2 more cups of water daily and continuing to add in another cup until you reach 8 cups. Want to work to replace soda with water instead to help with blood sugar.    Continue checking fasting blood sugar. Want to check fasting in the morning before eating or drinking and 1 time 1-2 hours after a meal as well  Record time and reading of blood sugar and bring to appointments.   Recommend following up with [facility] about water aerobics and including chair exercises as able. Recommend starting slow and gradually increasing minutes per week. Recommend consulting your doctor with any questions regarding whether a new physical activity is appropriate.

## 2019-01-28 NOTE — Progress Notes (Signed)
Diabetes Self-Management Education  Visit Type:    Appt. Start Time: 0915 Appt. End Time: 71  Makayla [name], identified by name and date of birth, is a 53 y.o. female with a diagnosis of Diabetes:     ASSESSMENT  DSME Follow-Up: Pt reports that her blood sugars have been 210-215 before meals. Pt has not been checking fasting. Pt reports having mirgraines at night and stomach problems (heartburn). Reports she feels she is eating right but still having GERD problems. Reports she will be having updated hgbA1c next month. Denies any other GI issues. Reports usually things like BBQ sauce will cause her to have reflux, but she has been avoiding those type foods. Reports feeling more stressed and feels that is why her blood sugar has been higher than usual. Reports having family stress. Pt reports she has still been having poor appetite. Reports she has not been feeling like eating. Makes herself eat at meals. Reports having 3 meals per day and no snacks typically.   Food Allergies/Intolerances: strawberries and peaches: swelling/itching. Lactose intolerant. Reports tomatoes cause acid reflux.   Avoided foods: whole grains; peanut butter; oatmeal; cheese; milk; yogurt; beans.   Last menstrual period 12/08/2012. Body mass index is 57.14 kg/m.  24 hour Recall: Breakfast (8AM): 1.5 pieces sausage, 2 tbsp grits, 1 cup regular coffee, creamer, Splenda Lunch:  Half bologna sandwich on white bread, half cup Sprite Dinner (PM): baked 1 pork chop, 1/3 cup mac and cheese, green beans, 1/3 cup corn, 1/2 cup Sprite Snk: None reported.  Beverages: 1/2 cup water   Typically about 2 cups water and 2 cups Sprite  Pt reports she walks to the mailbox and trash can from her home. Reports no changes in physical activity since last visit. Reports she called the [facility] to look into water aerobics but they have not yet called her back.   Individualized Plan for Diabetes Self-Management Training:   Plan:  -  Dietitian encouraged pt to check fasting blood sugar as well. Discussed effects of stress on blood sugar. Discussed including more water and less soda/sweetened drinks. Discussed trying lactose free milks. Encouraged pt to follow up with [facility] regarding water aerobics. Pt appeared agreeable to information/goals discussed.     Instructions/Goals:  Have consistent meals:   Continue trying to eat every 3-5 hours  Have balanced meals   Include 2-3 carb choices per meal (30-45 g carbohydrates per meal)  Recommend trying a lactose free milk: can try Lactaid milk or soy milk   Water goal: 64 oz per day: 8 cups or 4 x 16 oz bottles. Recommend starting with adding in 2 more cups of water daily and continuing to add in another cup until you reach 8 cups. Want to work to replace soda with water instead to help with blood sugar.    Continue checking fasting blood sugar. Want to check fasting in the morning before eating or drinking and 1 time 1-2 hours after a meal as well  Record time and reading of blood sugar and bring to appointments.   Recommend following up with [facility] about water aerobics and including chair exercises as able. Recommend starting slow and gradually increasing minutes per week. Recommend consulting your doctor with any questions regarding whether a new physical activity is appropriate.     If problems or questions, patient to contact team via:  Phone and Email  Future DSME appointment:  1 month.

## 2019-03-11 ENCOUNTER — Ambulatory Visit: Payer: Medicare Other | Admitting: Registered"

## 2019-12-24 ENCOUNTER — Encounter (HOSPITAL_COMMUNITY): Payer: Self-pay

## 2019-12-24 ENCOUNTER — Other Ambulatory Visit: Payer: Self-pay

## 2019-12-24 ENCOUNTER — Emergency Department (HOSPITAL_COMMUNITY)
Admission: EM | Admit: 2019-12-24 | Discharge: 2019-12-24 | Disposition: A | Discharge status: Home / Self Care | Payer: Medicare (Managed Care) | Attending: Emergency Medicine | Admitting: Emergency Medicine

## 2019-12-24 DIAGNOSIS — I120 Hypertensive chronic kidney disease with stage 5 chronic kidney disease or end stage renal disease: Secondary | ICD-10-CM | POA: Diagnosis not present

## 2019-12-24 DIAGNOSIS — Z79899 Other long term (current) drug therapy: Secondary | ICD-10-CM | POA: Insufficient documentation

## 2019-12-24 DIAGNOSIS — F17210 Nicotine dependence, cigarettes, uncomplicated: Secondary | ICD-10-CM | POA: Diagnosis not present

## 2019-12-24 DIAGNOSIS — Z794 Long term (current) use of insulin: Secondary | ICD-10-CM | POA: Insufficient documentation

## 2019-12-24 DIAGNOSIS — N185 Chronic kidney disease, stage 5: Secondary | ICD-10-CM | POA: Insufficient documentation

## 2019-12-24 DIAGNOSIS — R739 Hyperglycemia, unspecified: Secondary | ICD-10-CM

## 2019-12-24 LAB — BASIC METABOLIC PANEL
Anion gap: 13 (ref 5–15)
BUN: 26 mg/dL — ABNORMAL HIGH (ref 6–20)
CO2: 25 mmol/L (ref 22–32)
Calcium: 9.6 mg/dL (ref 8.9–10.3)
Chloride: 87 mmol/L — ABNORMAL LOW (ref 98–111)
Creatinine, Ser: 2.34 mg/dL — ABNORMAL HIGH (ref 0.44–1.00)
GFR calc Af Amer: 27 mL/min — ABNORMAL LOW (ref >60)
GFR calc non Af Amer: 23 mL/min — ABNORMAL LOW (ref >60)
Glucose, Bld: 665 mg/dL (ref 70–99)
Potassium: 3.8 mmol/L (ref 3.5–5.1)
Sodium: 125 mmol/L — ABNORMAL LOW (ref 135–145)

## 2019-12-24 LAB — BLOOD GAS, VENOUS
Acid-base deficit: 0.4 mmol/L (ref 0.0–2.0)
Bicarbonate: 26.8 mmol/L (ref 20.0–28.0)
O2 Saturation: 22 %
Patient temperature: 98.6
pCO2, Ven: 52.3 mmHg (ref 44.0–60.0)
pH, Ven: 7.33 (ref 7.250–7.430)
pO2, Ven: 19.6 mmHg — CL (ref 32.0–45.0)

## 2019-12-24 LAB — I-STAT BETA HCG BLOOD, ED (MC, WL, AP ONLY)
I-stat hCG, quantitative: <5 m[IU]/mL (ref <5)
I-stat hCG, quantitative: <5 m[IU]/mL (ref <5)

## 2019-12-24 LAB — URINALYSIS, ROUTINE W REFLEX MICROSCOPIC
Bilirubin Urine: NEGATIVE
Glucose, UA: >=500 mg/dL — AB
Hgb urine dipstick: NEGATIVE
Ketones, ur: NEGATIVE mg/dL
Leukocytes,Ua: NEGATIVE
Nitrite: NEGATIVE
Protein, ur: NEGATIVE mg/dL
Specific Gravity, Urine: 1.016 (ref 1.005–1.030)
pH: 5 (ref 5.0–8.0)

## 2019-12-24 LAB — CBG MONITORING, ED
Glucose-Capillary: >600 mg/dL (ref 70–99)
Glucose-Capillary: >600 mg/dL (ref 70–99)
Glucose-Capillary: 584 mg/dL (ref 70–99)
Glucose-Capillary: 470 mg/dL — ABNORMAL HIGH (ref 70–99)
Glucose-Capillary: 508 mg/dL (ref 70–99)
Glucose-Capillary: 349 mg/dL — ABNORMAL HIGH (ref 70–99)
Glucose-Capillary: 369 mg/dL — ABNORMAL HIGH (ref 70–99)
Glucose-Capillary: 362 mg/dL — ABNORMAL HIGH (ref 70–99)
Glucose-Capillary: 328 mg/dL — ABNORMAL HIGH (ref 70–99)

## 2019-12-24 LAB — CBC
HCT: 42.4 % (ref 36.0–46.0)
Hemoglobin: 14.1 g/dL (ref 12.0–15.0)
MCH: 27.9 pg (ref 26.0–34.0)
MCHC: 33.3 g/dL (ref 30.0–36.0)
MCV: 83.8 fL (ref 80.0–100.0)
Platelets: 154 10*3/uL (ref 150–400)
RBC: 5.06 MIL/uL (ref 3.87–5.11)
RDW: 13.2 % (ref 11.5–15.5)
WBC: 7 10*3/uL (ref 4.0–10.5)
nRBC: 0 % (ref 0.0–0.2)

## 2019-12-24 LAB — BETA-HYDROXYBUTYRIC ACID: Beta-Hydroxybutyric Acid: 0.9 mmol/L — ABNORMAL HIGH (ref 0.05–0.27)

## 2019-12-24 MED ORDER — DEXTROSE IN LACTATED RINGERS 5 % IV SOLN: INTRAVENOUS | Status: DC

## 2019-12-24 MED ORDER — POTASSIUM CHLORIDE CRYS ER 20 MEQ PO TBCR
40.0000 meq | EXTENDED_RELEASE_TABLET | Freq: Once | ORAL | Status: AC
  Administered 2019-12-24: 40 meq via ORAL
  Filled 2019-12-24: qty 2

## 2019-12-24 MED ORDER — SODIUM CHLORIDE 0.9 % IV BOLUS
1000.0000 mL | INTRAVENOUS | Status: AC
  Administered 2019-12-24: 1000 mL via INTRAVENOUS

## 2019-12-24 MED ORDER — LACTATED RINGERS IV SOLN: INTRAVENOUS | Status: DC

## 2019-12-24 MED ORDER — INSULIN REGULAR(HUMAN) IN NACL 100-0.9 UT/100ML-% IV SOLN
INTRAVENOUS | Status: DC
  Administered 2019-12-24: 11.5 [IU]/h via INTRAVENOUS
  Filled 2019-12-24: qty 100

## 2019-12-24 MED ORDER — DEXTROSE 50 % IV SOLN: 0.0000 mL | INTRAVENOUS | Status: DC | PRN

## 2019-12-24 NOTE — ED Provider Notes (Signed)
[facility] DEPT Provider Note   CSN: [id number] Arrival date & time: 12/24/19  1153     History Chief Complaint  Patient presents with  . Hyperglycemia    Makayla [name] is a 54 y.o. female with pertinent past medical history of CKD stage III not on dialysis, diabetes, hyperlipidemia, hypertension that presents the emergency department today for hyperglycemia via EMS.  Patient states that Makayla [name] thinks that her blood sugar has been high for a couple weeks, states that Makayla [name] went to a nephrologist 2 days ago and the reading was over 700.  States that today at home it was over 600.  States that Makayla [name] has been compliant with her diabetes medication, including her insulin.  Her diabetes regimen includes, Metformin, liraglutide, insulin Novolin and aspart.  States that Makayla [name] has been drinking a lot of Sprite and sweet tea at home, even though Makayla [name] is knows Makayla [name] is not supposed to.  States that Makayla [name] goes through 12 pack of Sprite in a week, also has been putting packet and sugar in her tea throughout the day.  Denies any symptoms currently.  Denies any nausea, vomiting, abdominal pain, fevers, chills, back pain, shortness of breath, chest pain, myalgias.  States that Makayla [name] is generally healthy, has been walking around with her cane per normal.  Denies any sick contacts.  Denies any cough, URI symptoms.  States that Makayla [name] feels fine, was told to come here due to her sugars being high.  Lives alone at home.  HPI     Past Medical History:  Diagnosis Date  . Cellulitis   . Chronic bronchitis ([place])   . CKD (chronic kidney disease), stage III   . Diabetes mellitus   . Hyperlipidemia   . Hypertension   . Neuropathy     Patient Active Problem List   Diagnosis Date Noted  . Renal failure, acute on chronic (HCC) 12/10/2012  . Hyperosmolar non-ketotic state in patient with type 2 diabetes mellitus ([place]) 12/08/2012  . Hypokalemia 12/08/2012  . ARF (acute renal failure) ([place]) 12/08/2012    . Dehydration 12/08/2012  . Obesity, morbid ([place]) 12/08/2012  . Tobacco abuse 12/08/2012  . SNORING 06/16/2009  . Diabetes mellitus without complication ([place]) 41/32/4401  . HYPERLIPIDEMIA 05/09/2009  . HYPERTENSION 05/09/2009    Past Surgical History:  Procedure Laterality Date  . TONSILLECTOMY       OB History   No obstetric history on file.     Family History  Problem Relation Age of Onset  . Diabetes Mother   . Hypertension Mother   . Hyperlipidemia Mother   . Diabetes Father   . Heart disease Father   . Diabetes Sister     Social History   Tobacco Use  . Smoking status: Current Every Day Smoker    Packs/day: 0.10    Years: 23.00    Pack years: 2.30    Types: Cigarettes  . Smokeless tobacco: Never Used  Vaping Use  . Vaping Use: Never used  Substance Use Topics  . Alcohol use: Yes    Comment: Occasional use: 4 drinks per occasion, once a month  . Drug use: No    Home Medications Prior to Admission medications   Medication Sig Start Date End Date Taking? Authorizing Provider  acetaminophen (TYLENOL) 500 MG tablet Take 2 tablets (1,000 mg total) by mouth every 6 (six) hours as needed for mild pain or moderate pain. 11/17/17  Yes Maczis, Barth Kirks, PA-C  albuterol (PROVENTIL HFA;VENTOLIN HFA) 108 (  90 BASE) MCG/ACT inhaler Inhale 2 puffs into the lungs every 6 (six) hours as needed. For shortness of breath    Yes [provider]  allopurinol (ZYLOPRIM) 100 MG tablet Take 1 tablet (100 mg total) by mouth daily. 05/03/17  Yes Glyn Ade, PA-C  amLODipine-benazepril (LOTREL) 5-10 MG capsule Take 1 capsule by mouth daily. 10/29/17  Yes [provider]  atorvastatin (LIPITOR) 20 MG tablet TAKE 1 TABLET BY MOUTH DAILY 09/24/18  Yes Adrian Prows, MD  buPROPion [facility] SR) 150 MG 12 hr tablet Take 150 mg by mouth 2 (two) times daily. 10/02/17  Yes [provider]  gabapentin (NEURONTIN) 400 MG capsule Take 400 mg by mouth 3 (three) times  daily.   Yes [provider]  levothyroxine (SYNTHROID) 50 MCG tablet Take 50 mcg by mouth daily before breakfast.    Yes [provider]  Liraglutide (VICTOZA [place]) Inject into the skin.   Yes [provider]  liraglutide (VICTOZA) 18 MG/3ML SOPN Inject 18 mg into the skin daily.   Yes [provider]  rosuvastatin (CRESTOR) 40 MG tablet Take 40 mg by mouth daily.    Yes [provider]  SYMBICORT 160-4.5 MCG/ACT inhaler Inhale 2 puffs into the lungs 2 (two) times daily. 10/02/17  Yes [provider]  TRESIBA FLEXTOUCH 200 UNIT/ML SOPN Inject 60 Units into the skin at bedtime. Patient taking differently: Inject 26 Units into the skin at bedtime.  01/17/15  Yes West, Emily, PA-C  B-D UF III MINI PEN NEEDLES 31G X 5 MM MISC See admin instructions. 04/28/15   [provider]  cyclobenzaprine (FLEXERIL) 5 MG tablet Take 1 tablet twice a day as needed for Muscle spasms. Patient not taking: Reported on 12/24/2019 01/31/15   Elayne Snare, MD  diclofenac sodium (VOLTAREN) 1 % GEL Apply 2 g topically 4 (four) times daily. Patient not taking: Reported on 11/17/2017 01/30/16   Leo Grosser, MD  glucose blood [facility] VERIO) test strip Use as instructed to check blood sugar 2 times daily Dx code E11.9 11/03/14   Elayne Snare, MD  insulin aspart (NOVOLOG) 100 UNIT/ML injection Inject 6 Units into the skin 3 (three) times daily with meals. Patient not taking: Reported on 11/17/2017 08/26/14   Elayne Snare, MD  Insulin Syringe-Needle U-100 (SAFETY-GLIDE 0.3CC SYR 29GX1/2) 29G X 1/2" 0.3 ML MISC Use as directed. 12/11/12   Modena Jansky, MD  metFORMIN (GLUCOPHAGE-XR) 750 MG 24 hr tablet Take 2 tablets daily Patient not taking: Reported on 11/17/2017 05/06/15   Elayne Snare, MD  methocarbamol (ROBAXIN) 500 MG tablet Take 1 tablet (500 mg total) by mouth every 8 (eight) hours as needed for muscle spasms (and pain). Patient not taking: Reported on 11/17/2017  01/17/15   Clayton Bibles, PA-C  nystatin cream (MYCOSTATIN) Apply to affected area 2 times daily Patient not taking: Reported on 05/03/2017 01/17/15   Clayton Bibles, PA-C  predniSONE (DELTASONE) 20 MG tablet Take 2 tablets (40 mg total) by mouth daily. Patient not taking: Reported on 11/17/2017 05/03/17   Glyn Ade, PA-C  UNABLE TO FIND 1. Glucometer x 1. 2. Lancets x 1 box. 3. Glucometer strips x 1 box. 12/11/12   Hongalgi, Lenis Dickinson, MD    Allergies    Ibuprofen, Peach flavor, Penicillins, and Strawberry extract  Review of Systems   Review of Systems  Constitutional: Negative for chills, diaphoresis, fatigue and fever.  HENT: Negative for congestion, facial swelling, hearing loss, sinus pressure, sore throat and  trouble swallowing.   Eyes: Negative for pain and visual disturbance.  Respiratory: Negative for cough, chest tightness, shortness of breath and wheezing.   Cardiovascular: Negative for chest pain, palpitations and leg swelling.  Gastrointestinal: Negative for abdominal distention, abdominal pain, diarrhea, nausea and vomiting.  Genitourinary: Negative for difficulty urinating.  Musculoskeletal: Negative for back pain, joint swelling, neck pain and neck stiffness.  Skin: Negative for pallor.  Neurological: Negative for dizziness, seizures, syncope, facial asymmetry, speech difficulty, weakness, light-headedness, numbness and headaches.  Psychiatric/Behavioral: Negative for confusion.    Physical Exam Updated Vital Signs BP 92/69 (BP Location: Right Arm)   Pulse 79   Temp 98.5 F (36.9 C)   Resp 16   Ht 5' 4.5" (1.638 m)   Wt (!) 181.4 kg   LMP 12/08/2012   SpO2 92%   BMI 67.60 kg/m   Physical Exam Constitutional:      General: Makayla [name] is not in acute distress.    Appearance: Normal appearance. Makayla [name] is not ill-appearing, toxic-appearing or diaphoretic.  HENT:     Mouth/Throat:     Mouth: Mucous membranes are moist.     Pharynx: Oropharynx is clear.  Eyes:      General: No scleral icterus.    Extraocular Movements: Extraocular movements intact.     Pupils: Pupils are equal, round, and reactive to light.  Cardiovascular:     Rate and Rhythm: Normal rate and regular rhythm.     Pulses: Normal pulses.     Heart sounds: Normal heart sounds.  Pulmonary:     Effort: Pulmonary effort is normal. No respiratory distress.     Breath sounds: Normal breath sounds. No stridor. No wheezing, rhonchi or rales.  Chest:     Chest wall: No tenderness.  Abdominal:     General: Abdomen is flat. There is no distension.     Palpations: Abdomen is soft.     Tenderness: There is no abdominal tenderness. There is no guarding or rebound.  Musculoskeletal:        General: No swelling or tenderness. Normal range of motion.     Cervical back: Normal range of motion and neck supple. No rigidity.     Right lower leg: No edema.     Left lower leg: No edema.  Skin:    General: Skin is warm and dry.     Capillary Refill: Capillary refill takes less than 2 seconds.     Coloration: Skin is not pale.  Neurological:     General: No focal deficit present.     Mental Status: Makayla [name] is alert and oriented to person, place, and time.     Comments: Alert. Clear speech. No facial droop. CNIII-XII grossly intact. Bilateral upper and lower extremities' sensation grossly intact. 5/5 symmetric strength with grip strength and with plantar and dorsi flexion bilaterally.  Psychiatric:        Mood and Affect: Mood normal.        Behavior: Behavior normal.     ED Results / Procedures / Treatments   Labs (all labs ordered are listed, but only abnormal results are displayed) Labs Reviewed  BASIC METABOLIC PANEL - Abnormal; Notable for the following components:      Result Value   Sodium 125 (*)    Chloride 87 (*)    Glucose, Bld 665 (*)    BUN 26 (*)    Creatinine, Ser 2.34 (*)    GFR calc non Af Amer 23 (*)    GFR calc  Af Amer 27 (*)    All other components within normal limits    URINALYSIS, ROUTINE W REFLEX MICROSCOPIC - Abnormal; Notable for the following components:   Glucose, UA >=500 (*)    Bacteria, UA RARE (*)    All other components within normal limits  BETA-HYDROXYBUTYRIC ACID - Abnormal; Notable for the following components:   Beta-Hydroxybutyric Acid 0.90 (*)    All other components within normal limits  BLOOD GAS, VENOUS - Abnormal; Notable for the following components:   pO2, Ven 19.6 (*)    All other components within normal limits  CBG MONITORING, ED - Abnormal; Notable for the following components:   Glucose-Capillary >600 (*)    All other components within normal limits  CBG MONITORING, ED - Abnormal; Notable for the following components:   Glucose-Capillary >600 (*)    All other components within normal limits  CBG MONITORING, ED - Abnormal; Notable for the following components:   Glucose-Capillary 584 (*)    All other components within normal limits  CBG MONITORING, ED - Abnormal; Notable for the following components:   Glucose-Capillary 508 (*)    All other components within normal limits  CBG MONITORING, ED - Abnormal; Notable for the following components:   Glucose-Capillary 470 (*)    All other components within normal limits  CBG MONITORING, ED - Abnormal; Notable for the following components:   Glucose-Capillary 369 (*)    All other components within normal limits  CBG MONITORING, ED - Abnormal; Notable for the following components:   Glucose-Capillary 349 (*)    All other components within normal limits  CBG MONITORING, ED - Abnormal; Notable for the following components:   Glucose-Capillary 362 (*)    All other components within normal limits  CBG MONITORING, ED - Abnormal; Notable for the following components:   Glucose-Capillary 328 (*)    All other components within normal limits  CBC  BETA-HYDROXYBUTYRIC ACID  BETA-HYDROXYBUTYRIC ACID  I-STAT BETA HCG BLOOD, ED ([facility], [facility], [facility] ONLY)  I-STAT BETA HCG BLOOD, ED ([facility], [facility], [facility]  ONLY)    EKG EKG Interpretation  Date/Time:  Thursday December 24 2019 14:09:18 EDT Ventricular Rate:  82 PR Interval:    QRS Duration: 92 QT Interval:  373 QTC Calculation: 436 R Axis:   87 Text Interpretation: Sinus rhythm No significant change since prior 4/12 Confirmed by Aletta Edouard [phone]) on 12/24/2019 2:11:03 PM   Radiology No results found.  Procedures .Critical Care Performed by: Alfredia Client, PA-C Authorized by: Alfredia Client, PA-C   Critical care provider statement:    Critical care time (minutes):  45   Critical care was necessary to treat or prevent imminent or life-threatening deterioration of the following conditions:  Metabolic crisis   Critical care was time spent personally by me on the following activities:  Discussions with consultants, evaluation of patient's response to treatment, examination of patient, ordering and performing treatments and interventions, ordering and review of laboratory studies, ordering and review of radiographic studies, pulse oximetry, re-evaluation of patient's condition, obtaining history from patient or surrogate and review of old charts   (including critical care time)  Medications Ordered in ED Medications  sodium chloride 0.9 % bolus 1,000 mL (0 mLs Intravenous Stopped 12/24/19 1503)  insulin regular, human (MYXREDLIN) 100 units/ 100 mL infusion ( Intravenous Stopped 12/24/19 2042)  lactated ringers infusion ( Intravenous New Bag/Given 12/24/19 1601)  dextrose 5 % in lactated ringers infusion (has no administration in time range)  dextrose 50 %  solution 0-50 mL (has no administration in time range)  potassium chloride SA (KLOR-CON) CR tablet 40 mEq (40 mEq Oral Given 12/24/19 1447)    ED Course  I have reviewed the triage vital signs and the nursing notes.  Pertinent labs & imaging results that were available during my care of the patient were reviewed by me and considered in my medical decision making (see chart for  details).  Clinical Course as of Dec 23 2041  Thu Dec 23, 5265  406 54 year old female with history of diabetes here with elevated blood sugar.  Makayla [name] does not have any real complaints other than polyuria.  Makayla [name] does not have a machine at home to check her sugars.  Labs here showing elevated glucose but normal pH.  Normal gap.  Beta hydroxybutyrate is slightly elevated.  Needs IV fluids and better diabetic management plan.   [MB]    Clinical Course User Index [MB] Hayden Rasmussen, MD   MDM Rules/Calculators/A&P                         Elowen Debruyn is a 54 y.o. female with pertinent past medical history of CKD stage III not on dialysis, diabetes, hyperlipidemia, hypertension that presents the emergency department today for hyperglycemia via EMS.  Sugar greater than 600, patient is currently asymptomatic.  Will start IV fluids and obtain labs to evaluate if patient is in DKA, HHS, hyperglycemic state.  Satting at 100% with good waveform, no respiratory distress.  Work-up shows that patient is most likely in hyperglycemic state without DKA or HHS.  Patient patient's anion gap 13, CO2 25.  Serum osmolality 296.  Sodium 125, after this was corrected sodium 134. Cr at baseline. We will start IV insulin, did give oral potassium.  Will reassess.  After reassessment glucose down to 328, pt remains asymptomatic. Wants to go home. Urine without signs of infection. Pt to be discharged with strict return precautions and education on diabetes management. Pt agreeable.   Doubt need for further emergent work up at this time. I explained the diagnosis and have given explicit precautions to return to the ER including for any other new or worsening symptoms. The patient understands and accepts the medical plan as it's been dictated and I have answered their questions. Discharge instructions concerning home care and prescriptions have been given. The patient is STABLE and is discharged to home in good condition.  I  discussed this case with my attending physician who cosigned this note including patient's presenting symptoms, physical exam, and planned diagnostics and interventions. Attending physician stated agreement with plan or made changes to plan which were implemented.   Attending physician assessed patient at bedside.   Final Clinical Impression(s) / ED Diagnoses Final diagnoses:  Hyperglycemia    Rx / DC Orders ED Discharge Orders    None       Alfredia Client, PA-C 12/24/19 2045    Hayden Rasmussen, MD 12/25/19 1018

## 2019-12-24 NOTE — ED Triage Notes (Signed)
Per EMS- Patient c/o blood sugar reading high on the monitor at home.

## 2019-12-24 NOTE — ED Notes (Signed)
CRITICAL VALUE ALERT  Critical Value:  Glucose 665  Date & Time Notied:  12/24/2019  1339  Provider Notified: Posey Pronto PA   Orders Received/Actions taken: Provider aware

## 2019-12-24 NOTE — Discharge Instructions (Addendum)
You are seen today for high blood sugar, this is most likely because you have been drinking a lot of Sprite and sweet tea.  You should continue taking your diabetic medications and follow-up with your primary care in the next couple of days.  Please stop drinking Sprite and sweet tea, this is severely increasing your blood sugar at this time.  Please use the attached instructions.  Continue to keep an eye on your sugars.  If you have any new or worsening concerning symptoms come back to the emergency department.

## 2019-12-24 NOTE — ED Provider Notes (Signed)
Plan to follow up on urine, if not infected discharge.   UA with out infection.  Patient discharged.   After patient was discharged nursing expressed concern that patient had only been getting short acting insulin without long-acting insulin. I spoke with patient.  She states that she has her usual nighttime medications at home, has not run out of them.  She and I discussed the importance of taking them tonight, along with risks of not doing so and she states her understanding.  Patient is awake and alert, lying in bed in no obvious distress.  We discussed the importance of dietary modification and her overall treatment regimen.  She already has a follow-up appointment with [facility], her PCP, tomorrow.     As she does not know the names and dosages of her medications I am unable to provide them for her in the ER tonight and do not want to give long acting insulin that may interact with her other medications and make her possibly hypoglycemic.  I also recommended that next time she come to a [facility] facility she make sure she brings a updated medication list.  The decision to not give long acting insulin was discussed with Dr. Ashok Cordia.   Return precautions were discussed with patient who states their understanding.  At the time of discharge patient denied any unaddressed complaints or concerns.  Patient is agreeable for discharge home.  Note: Portions of this report may have been transcribed using voice recognition software. Every effort was made to ensure accuracy; however, inadvertent computerized transcription errors may be present            Ollen Gross 12/24/19 2044    Lajean Saver, MD 12/24/19 2314

## 2020-03-15 ENCOUNTER — Other Ambulatory Visit: Payer: Self-pay | Admitting: General Practice

## 2020-03-15 DIAGNOSIS — Z1231 Encounter for screening mammogram for malignant neoplasm of breast: Secondary | ICD-10-CM

## 2020-05-30 ENCOUNTER — Other Ambulatory Visit: Payer: Self-pay

## 2020-05-30 ENCOUNTER — Encounter: Payer: Self-pay | Admitting: Student

## 2020-05-30 ENCOUNTER — Ambulatory Visit: Payer: Medicare Other | Admitting: Student

## 2020-05-30 VITALS — BP 138/78 | HR 100 | Temp 98.2°F | Resp 12 | Ht 64.5 in | Wt 334.2 lb

## 2020-05-30 DIAGNOSIS — E118 Type 2 diabetes mellitus with unspecified complications: Secondary | ICD-10-CM

## 2020-05-30 DIAGNOSIS — I10 Essential (primary) hypertension: Secondary | ICD-10-CM

## 2020-05-30 DIAGNOSIS — I358 Other nonrheumatic aortic valve disorders: Secondary | ICD-10-CM

## 2020-05-30 DIAGNOSIS — F17200 Nicotine dependence, unspecified, uncomplicated: Secondary | ICD-10-CM

## 2020-05-30 DIAGNOSIS — R0600 Dyspnea, unspecified: Secondary | ICD-10-CM

## 2020-05-30 DIAGNOSIS — N1832 Chronic kidney disease, stage 3b: Secondary | ICD-10-CM

## 2020-05-30 DIAGNOSIS — R0609 Other forms of dyspnea: Secondary | ICD-10-CM

## 2020-05-30 DIAGNOSIS — E7800 Pure hypercholesterolemia, unspecified: Secondary | ICD-10-CM

## 2020-05-30 NOTE — Progress Notes (Signed)
Primary Physician/Referring:  Vonna Drafts, FNP  Patient ID: Makayla [name], female    DOB: 1965/08/05, 55 y.o.   MRN: [id number]  Chief Complaint  Patient presents with  . Hypertension  . Follow-up    Referred by Andree Moro, MD   HPI:    Makayla [name]  is a 55 y.o. AA female with history of uncontrolled diabetes mellitus, hypertension, stage IIIb chronic kidney disease, hyperlipidemia, obesity, obesity hypoventilation, 20-pack-year history of tobacco use.  She was last seen in our office 12/30/2017 by Binnie Kand, FNP-C for cardiovascular risk stratification prior to bariatric surgery.  At that time patient underwent low risk nuclear stress test and normal echocardiogram.  She is now referred back to our office by Frederica Kuster, for management of hypertension.  Patient states she is here for reevaluation of preoperative risk stratification for bariatric surgery, as well as management of hypertension and cardiovascular risk factors.  Patient lives relatively inactive lifestyle, without exercise or frequent activity.  She states no strenuous activity she is done over the last several months is clean up around her house.  She does report dyspnea on exertion which requires her to take frequent breaks while cleaning her home.  Unfortunately she does continue to smoke approximately 2 cigarettes/day.  She admits to stopping her Crestor approximately 1 month ago, however it is unclear why this medication was stopped.  Patient did not bring a list of medications or medication bottles with her today, therefore medication reconciliation is difficult.   Overall patient states she feels well with the exception of left hip pain which limits her activity.  Denies chest pain, palpitations, leg swelling, syncope, near syncope.  Patient is unsure of the clinic with which she follows for weight management and does not know the name of the surgeon who will be performing bariatric surgery.  Notably surgery is not  currently scheduled.  Past Medical History:  Diagnosis Date  . Cellulitis   . Chronic bronchitis ([place])   . CKD (chronic kidney disease), stage III ([place])   . Diabetes mellitus   . Hyperlipidemia   . Hypertension   . Neuropathy    Past Surgical History:  Procedure Laterality Date  . TONSILLECTOMY     Family History  Problem Relation Age of Onset  . Diabetes Mother   . Hypertension Mother   . Hyperlipidemia Mother   . Diabetes Father   . Heart disease Father   . Diabetes Sister     Social History   Tobacco Use  . Smoking status: Current Some Day Smoker    Packs/day: 0.10    Years: 23.00    Pack years: 2.30    Types: Cigarettes  . Smokeless tobacco: Never Used  Substance Use Topics  . Alcohol use: Not Currently    Comment: Occasional use: 4 drinks per occasion, once a month   Marital Status: Single   ROS  Review of Systems  Constitutional: Negative for malaise/fatigue and weight gain.  Cardiovascular: Positive for dyspnea on exertion. Negative for chest pain, claudication, leg swelling, near-syncope, orthopnea, palpitations, paroxysmal nocturnal dyspnea and syncope.  Hematologic/Lymphatic: Does not bruise/bleed easily.  Musculoskeletal: Positive for joint pain (bilateral knees, left hip).  Gastrointestinal: Negative for melena.  Neurological: Negative for dizziness and weakness.    Objective  Blood pressure 138/78, pulse 100, temperature 98.2 F (36.8 C), temperature source Temporal, resp. rate 12, height 5' 4.5" (1.638 m), weight (!) 334 lb 3.2 oz (151.6 kg), last menstrual period 12/08/2012, SpO2 93 %.  Vitals with BMI 05/30/2020 12/24/2019 12/24/2019  Height 5' 4.5" - -  Weight 334 lbs 3 oz - -  BMI 123XX123 - -  Systolic 0000000 92 97  Diastolic 78 69 64  Pulse 123XX123 79 84      Physical Exam Vitals reviewed.  Constitutional:      Appearance: She is morbidly obese.  HENT:     Head: Normocephalic and atraumatic.  Eyes:     Extraocular Movements:     Right eye:  Nystagmus present.     Left eye: Nystagmus present.  Cardiovascular:     Rate and Rhythm: Normal rate and regular rhythm.     Pulses: Intact distal pulses.          Carotid pulses are 2+ on the right side and 2+ on the left side.      Radial pulses are 2+ on the right side and 2+ on the left side.       Dorsalis pedis pulses are 1+ on the right side and 1+ on the left side.       Posterior tibial pulses are 0 on the right side and 0 on the left side.     Heart sounds: S1 normal and S2 normal. Murmur heard.   Harsh midsystolic murmur is present with a grade of 2/6 at the upper right sternal border. No gallop.      Comments: Unable to evaluate femoral and popliteal pulses due to patient body habitus Pulmonary:     Effort: Pulmonary effort is normal. No respiratory distress.     Breath sounds: No wheezing, rhonchi or rales.  Abdominal:     General: Bowel sounds are normal.     Palpations: Abdomen is soft.     Comments: Large panus present   Musculoskeletal:     Right lower leg: Edema (1+ pitting) present.     Left lower leg: Edema (1+ pitting ) present.  Skin:    General: Skin is warm and dry.     Comments: Acanthosis nigricans  Neurological:     General: No focal deficit present.     Mental Status: She is alert and oriented to person, place, and time.  Psychiatric:        Mood and Affect: Mood normal.        Behavior: Behavior normal.     Laboratory examination:   Recent Labs    12/24/19 1235  NA 125*  K 3.8  CL 87*  CO2 25  GLUCOSE 665*  BUN 26*  CREATININE 2.34*  CALCIUM 9.6  GFRNONAA 23*  GFRAA 27*   CrCl cannot be calculated (Patient's most recent lab result is older than the maximum 21 days allowed.).  CMP Latest Ref Rng & Units 12/24/2019 05/03/2017 05/02/2015  Glucose 70 - 99 mg/dL 665(HH) 215(H) 133(H)  BUN 6 - 20 mg/dL 26(H) 25(H) 11  Creatinine 0.44 - 1.00 mg/dL 2.34(H) 2.28(H) 1.70(H)  Sodium 135 - 145 mmol/L 125(L) 133(L) 135  Potassium 3.5 - 5.1 mmol/L  3.8 4.5 4.2  Chloride 98 - 111 mmol/L 87(L) 102 100  CO2 22 - 32 mmol/L '25 22 27  '$ Calcium 8.9 - 10.3 mg/dL 9.6 9.6 9.3  Total Protein 6.0 - 8.3 g/dL - - -  Total Bilirubin 0.2 - 1.2 mg/dL - - -  Alkaline Phos 39 - 117 U/L - - -  AST 0 - 37 U/L - - -  ALT 0 - 35 U/L - - -   CBC Latest Ref Rng &  Units 12/24/2019 05/03/2017 01/17/2015  WBC 4.0 - 10.5 K/uL 7.0 11.4(H) 5.1  Hemoglobin 12.0 - 15.0 g/dL 14.1 12.3 14.7  Hematocrit 36.0 - 46.0 % 42.4 37.4 45.2  Platelets 150 - 400 K/uL 154 258 162    Lipid Panel No results for input(s): CHOL, TRIG, LDLCALC, VLDL, HDL, CHOLHDL, LDLDIRECT in the last 8760 hours.  HEMOGLOBIN A1C Lab Results  Component Value Date   HGBA1C 7.6 05/02/2015   MPG 381 (H) 12/08/2012   TSH No results for input(s): TSH in the last 8760 hours.  External labs:   03/01/2020: Glucose 99, BUN 20, creatinine 1.8, GFR 32, sodium 131, potassium 5.1 Total cholesterol 219, HDL 66, triglycerides 120, LDL 130, non-HDL 153 Hemoglobin 13.5, hematocrit 40.5, MCV 85.3, platelet 230 TSH 6.34 (high), free T4 1.3 (normal)  Medications and allergies   Allergies  Allergen Reactions  . Ibuprofen Other (See Comments)    Can not take due to kidney problems.   Marland Kitchen Peach Flavor Swelling    Peaches.   . Penicillins     Inflates bronchitis.  Has patient had a PCN reaction causing immediate rash, facial/tongue/throat swelling, SOB or lightheadedness with hypotension: Yes- shortness of breathe.  Has patient had a PCN reaction causing severe rash involving mucus membranes or skin necrosis: No Has patient had a PCN reaction that required hospitalization No Has patient had a PCN reaction occurring within the last 10 years: No If all of the above answers are "NO", then may proceed with Cephalosporin use.   . Strawberry Extract Swelling     Outpatient Medications Prior to Visit  Medication Sig Dispense Refill  . acetaminophen (TYLENOL) 500 MG tablet Take 2 tablets (1,000 mg total) by  mouth every 6 (six) hours as needed for mild pain or moderate pain. 30 tablet 0  . albuterol (PROVENTIL HFA;VENTOLIN HFA) 108 (90 BASE) MCG/ACT inhaler Inhale 2 puffs into the lungs every 6 (six) hours as needed. For shortness of breath    . amLODipine-benazepril (LOTREL) 5-10 MG capsule Take 1 capsule by mouth daily.  5  . B-D UF III MINI PEN NEEDLES 31G X 5 MM MISC See admin instructions.  0  . cyclobenzaprine (FLEXERIL) 5 MG tablet Take 1 tablet twice a day as needed for Muscle spasms. 10 tablet 0  . diclofenac sodium (VOLTAREN) 1 % GEL Apply 2 g topically 4 (four) times daily. 100 g 0  . gabapentin (NEURONTIN) 400 MG capsule Take 400 mg by mouth 3 (three) times daily.    Marland Kitchen glucose blood (ONETOUCH VERIO) test strip Use as instructed to check blood sugar 2 times daily Dx code E11.9 100 each 3  . Insulin Syringe-Needle U-100 (SAFETY-GLIDE 0.3CC SYR 29GX1/2) 29G X 1/2" 0.3 ML MISC Use as directed. 200 each 0  . nystatin cream (MYCOSTATIN) Apply to affected area 2 times daily 15 g 0  . rosuvastatin (CRESTOR) 40 MG tablet Take 40 mg by mouth daily.    . SYMBICORT 160-4.5 MCG/ACT inhaler Inhale 2 puffs into the lungs 2 (two) times daily.  3  . TRESIBA FLEXTOUCH 200 UNIT/ML SOPN Inject 60 Units into the skin at bedtime. (Patient taking differently: Inject 26 Units into the skin at bedtime.) 6 pen 0  . UNABLE TO FIND 1. Glucometer x 1. 2. Lancets x 1 box. 3. Glucometer strips x 1 box. 1 Units 0  . allopurinol (ZYLOPRIM) 100 MG tablet Take 1 tablet (100 mg total) by mouth daily. 14 tablet 0  . atorvastatin (LIPITOR) 20 MG  tablet TAKE 1 TABLET BY MOUTH DAILY 90 tablet 0  . buPROPion (WELLBUTRIN SR) 150 MG 12 hr tablet Take 150 mg by mouth 2 (two) times daily.  5  . insulin aspart (NOVOLOG) 100 UNIT/ML injection Inject 6 Units into the skin 3 (three) times daily with meals. (Patient not taking: Reported on 11/17/2017) 1 vial 3  . levothyroxine (SYNTHROID) 50 MCG tablet Take 50 mcg by mouth daily before  breakfast.     . Liraglutide (VICTOZA [place]) Inject into the skin.    Marland Kitchen liraglutide (VICTOZA) 18 MG/3ML SOPN Inject 18 mg into the skin daily.    . metFORMIN (GLUCOPHAGE-XR) 750 MG 24 hr tablet Take 2 tablets daily (Patient not taking: No sig reported) 60 tablet 2  . methocarbamol (ROBAXIN) 500 MG tablet Take 1 tablet (500 mg total) by mouth every 8 (eight) hours as needed for muscle spasms (and pain). 12 tablet 0  . predniSONE (DELTASONE) 20 MG tablet Take 2 tablets (40 mg total) by mouth daily. (Patient not taking: No sig reported) 10 tablet 0   No facility-administered medications prior to visit.     Radiology:   No results found.  Cardiac Studies:   Echocardiogram 12/13/2017:  Left ventricle cavity size is mildly reduced.  Systolic function normal.  No diastolic regional wall motion abnormality,.  Left ventricular diastolic function parameters were normal.  Nuclear stress test 12/19/2017: 1.  Moderate reversible defect within the septum. 2.  Normal left ventricular wall motion 3.  Left ventricular ejection fraction 77% 4.  Noninvasive for stratification: Intermediate In view of decreased tracer uptake only in the septum and no wall motion abnormalities or decreased LVEF, perfusion abnormality likely breast tissue attenuation and study can be considered low risk.  EKG:   EKG 05/30/2020: Sinus rhythm at a rate of 86 bpm.  Normal axis.  Poor R wave progression, cannot exclude anteroseptal infarct old.  Nonspecific T wave abnormality.  Low voltage complexes, consider pulmonary disease pattern.  Compared to EKG 12/06/2017, poor R wave progression new.  EKG 12/06/2017: Normal sinus rhythm at a rate of 88 bpm, normal axis.  No evidence of ischemia.  Low voltage complexes.  Normal EKG.  Assessment     ICD-10-CM   1. Essential hypertension  I10 EKG 12-Lead    PCV ECHOCARDIOGRAM COMPLETE    PCV MYOCARDIAL PERFUSION WITH LEXISCAN  2. Hypercholesterolemia  E78.00 PCV MYOCARDIAL PERFUSION WITH  LEXISCAN  3. Type 2 diabetes mellitus with complication, with long-term current use of insulin (HCC)  E11.8 PCV MYOCARDIAL PERFUSION WITH LEXISCAN   Z79.4   4. Morbid obesity ([place])  E66.01 PCV MYOCARDIAL PERFUSION WITH LEXISCAN  5. Tobacco use disorder  F17.200 PCV MYOCARDIAL PERFUSION WITH LEXISCAN  6. Stage 3b chronic kidney disease ([facility])  N18.32   7. Systolic murmur of aorta  I35.8 PCV ECHOCARDIOGRAM COMPLETE  8. Dyspnea on exertion  R06.00 PCV ECHOCARDIOGRAM COMPLETE    PCV MYOCARDIAL PERFUSION WITH LEXISCAN     Medications Discontinued During This Encounter  Medication Reason  . allopurinol (ZYLOPRIM) 100 MG tablet Discontinued by provider  . buPROPion (WELLBUTRIN SR) 150 MG 12 hr tablet Discontinued by provider  . atorvastatin (LIPITOR) 20 MG tablet Change in therapy  . insulin aspart (NOVOLOG) 100 UNIT/ML injection Change in therapy  . levothyroxine (SYNTHROID) 50 MCG tablet No longer needed (for PRN medications)  . methocarbamol (ROBAXIN) 500 MG tablet Error  . metFORMIN (GLUCOPHAGE-XR) 750 MG 24 hr tablet Error  . Liraglutide (VICTOZA [place]) Error  .  liraglutide (VICTOZA) 18 MG/3ML SOPN Error  . predniSONE (DELTASONE) 20 MG tablet Error  . UNABLE TO FIND Error    No orders of the defined types were placed in this encounter.   Recommendations:   Lajeune Marchel is a 55 y.o. AA female with history of uncontrolled diabetes mellitus, hypertension, stage IIIb chronic kidney disease, hyperlipidemia, obesity, obesity hypoventilation, 20-pack-year history of tobacco use.  She was last seen in our office 12/30/2017 by Binnie Kand, FNP-C for cardiovascular risk stratification prior to bariatric surgery.  At that time patient underwent low risk nuclear stress test and normal echocardiogram.  She is now referred back to our office by Frederica Kuster, for management of hypertension.  Patient is referred back to our office to establish care for preoperative for stratification as well as management  of hypertension and cardiovascular risk factors.  Patient has multiple cardiovascular risk factors including diabetes, hypertension, hyperlipidemia, morbid obesity, and tobacco use, as well as sedentary lifestyle.  10-year risk of cardiovascular event is 20.3% presently.  In view of elevated cardiovascular risk and present symptoms of dyspnea on exertion, will obtain nuclear stress test, she will need 2-day protocol.  Patient has new systolic murmur noted at the right upper sternal border on physical exam, which is new.  In view of murmur and dyspnea will obtain echocardiogram.  In regard to hypertension, blood pressure is elevated above goal today and patient does not bring with her home readings.  Could consider increasing amlodipine, however patient is unclear of exactly which medication she is presently taking.  Therefore I will hold off on making changes to her antihypertensive medications until accurate medication reconciliation can be done.  Also counseled patient regarding weight loss and improvement of blood pressure control.  In regard to hyperlipidemia, lipids are uncontrolled.  Patient states she is no longer taking Crestor, upon accurate medication reconciliation if this is truly the case I would recommend restarting rosuvastatin 40 mg daily as patient states she tolerated it well and is not sure why it was stopped.  Will defer further management of diabetes mellitus and thyroid dysfunction to patient's PCP.  Discussed at length with patient regarding the importance of controlling cardiovascular factors including hypertension, hyperlipidemia, obesity, diabetes.  She verbalized understanding.  Patient is very focused on preparing for bariatric surgery, stating that it will improve all of this for her.  Follow up in 4 weeks, sooner if needed, for results of cardiac testing.    This was a 45-minute encounter with face-to-face counseling, medical records review, coordination of care, explanation  of complex medical issues, complex medical decision making.    During this visit I reviewed and updated: Tobacco history  allergies medication reconciliation  medical history  surgical history  family history  social history.  This note was created using a voice recognition software as a result there may be grammatical errors inadvertently enclosed that do not reflect the nature of this encounter. Every attempt is made to correct such errors.   Alethia Berthold, PA-C 06/01/2020, 8:58 AM Office: [phone]

## 2020-06-01 ENCOUNTER — Other Ambulatory Visit: Payer: Self-pay

## 2020-06-13 ENCOUNTER — Other Ambulatory Visit: Payer: Self-pay

## 2020-06-13 ENCOUNTER — Ambulatory Visit: Payer: Medicare Other

## 2020-06-13 DIAGNOSIS — I358 Other nonrheumatic aortic valve disorders: Secondary | ICD-10-CM

## 2020-06-13 DIAGNOSIS — I10 Essential (primary) hypertension: Secondary | ICD-10-CM

## 2020-06-13 DIAGNOSIS — R0600 Dyspnea, unspecified: Secondary | ICD-10-CM

## 2020-06-13 DIAGNOSIS — R0609 Other forms of dyspnea: Secondary | ICD-10-CM

## 2020-06-16 NOTE — Progress Notes (Signed)
Attempted to call pt, the number is not in service. Unable to leave a message.

## 2020-06-16 NOTE — Progress Notes (Signed)
Please inform patient echo is normal.  Also please verify pt has f/u scheduled 06/27/20 or within a couple weeks after that.

## 2020-06-17 NOTE — Progress Notes (Signed)
2nd attempt: Patient number on file is disconnected.

## 2020-10-22 ENCOUNTER — Emergency Department (HOSPITAL_COMMUNITY)
Admission: EM | Admit: 2020-10-22 | Discharge: 2020-10-22 | Disposition: A | Discharge status: Home / Self Care | Payer: Medicare Other | Attending: Emergency Medicine | Admitting: Emergency Medicine

## 2020-10-22 ENCOUNTER — Other Ambulatory Visit: Payer: Self-pay

## 2020-10-22 ENCOUNTER — Encounter (HOSPITAL_COMMUNITY): Payer: Self-pay | Admitting: Emergency Medicine

## 2020-10-22 DIAGNOSIS — Y9301 Activity, walking, marching and hiking: Secondary | ICD-10-CM | POA: Diagnosis not present

## 2020-10-22 DIAGNOSIS — Z794 Long term (current) use of insulin: Secondary | ICD-10-CM | POA: Diagnosis not present

## 2020-10-22 DIAGNOSIS — E1122 Type 2 diabetes mellitus with diabetic chronic kidney disease: Secondary | ICD-10-CM | POA: Insufficient documentation

## 2020-10-22 DIAGNOSIS — W108XXA Fall (on) (from) other stairs and steps, initial encounter: Secondary | ICD-10-CM | POA: Diagnosis not present

## 2020-10-22 DIAGNOSIS — E1140 Type 2 diabetes mellitus with diabetic neuropathy, unspecified: Secondary | ICD-10-CM | POA: Diagnosis not present

## 2020-10-22 DIAGNOSIS — M791 Myalgia, unspecified site: Secondary | ICD-10-CM

## 2020-10-22 DIAGNOSIS — M545 Low back pain, unspecified: Secondary | ICD-10-CM | POA: Insufficient documentation

## 2020-10-22 DIAGNOSIS — I129 Hypertensive chronic kidney disease with stage 1 through stage 4 chronic kidney disease, or unspecified chronic kidney disease: Secondary | ICD-10-CM | POA: Diagnosis not present

## 2020-10-22 DIAGNOSIS — N183 Chronic kidney disease, stage 3 unspecified: Secondary | ICD-10-CM | POA: Diagnosis not present

## 2020-10-22 DIAGNOSIS — Z79899 Other long term (current) drug therapy: Secondary | ICD-10-CM | POA: Insufficient documentation

## 2020-10-22 DIAGNOSIS — F17210 Nicotine dependence, cigarettes, uncomplicated: Secondary | ICD-10-CM | POA: Diagnosis not present

## 2020-10-22 DIAGNOSIS — W19XXXA Unspecified fall, initial encounter: Secondary | ICD-10-CM

## 2020-10-22 MED ORDER — HYDROCODONE-ACETAMINOPHEN 5-325 MG PO TABS
2.0000 | ORAL_TABLET | ORAL | 0 refills | Status: DC | PRN
Start: 2020-10-22 — End: 2021-04-13

## 2020-10-22 MED ORDER — HYDROCODONE-ACETAMINOPHEN 5-325 MG PO TABS
1.0000 | ORAL_TABLET | Freq: Once | ORAL | Status: AC
  Administered 2020-10-22: 1 via ORAL
  Filled 2020-10-22: qty 1

## 2020-10-22 NOTE — ED Triage Notes (Signed)
Patient BIBA from home d/t a fall earlier today. She reports having muscle spasms in her thighs which caused her to fall. She also reports having gout in her L foot which attributed to the fall. She states she fell backwards onto her buttocks. Denies LOC or hitting head. Denies taking blood thinners.   VS WDL.

## 2020-10-22 NOTE — ED Provider Notes (Signed)
[facility] DEPT Provider Note   CSN: [id number] Arrival date & time: 10/22/20  1611     History Chief Complaint  Patient presents with   Makayla [name]    Makayla [name] is a 55 y.o. female.  HPI She presents by EMS for evaluation treatment after a fall today.  She describes walking into her home, carrying a drink in a fish sandwich, and fell as she stepped up onto a stair step.  She states that she slipped, because the step was wet.  She was not able to ambulate afterwards, and called EMS who transferred her here.  She complains of "muscle spasm", and points to her bilateral lower back, as the site of that discomfort.  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  She states that she has gout in her feet.  When asked if she was injured in the fall today she states "not a scratch."  She denies weakness, paresthesias, fever or chills.  There are no other known active modifying factors.    Past Medical History:  Diagnosis Date   Cellulitis    Chronic bronchitis ([place])    CKD (chronic kidney disease), stage III ([place])    Diabetes mellitus    Hyperlipidemia    Hypertension    Neuropathy     Patient Active Problem List   Diagnosis Date Noted   Renal failure, acute on chronic ([place]) 12/10/2012   Hyperosmolar non-ketotic state in patient with type 2 diabetes mellitus ([place]) 12/08/2012   Hypokalemia 12/08/2012   ARF (acute renal failure) ([place]) 12/08/2012   Dehydration 12/08/2012   Obesity, morbid ([place]) 12/08/2012   Tobacco abuse 12/08/2012   SNORING 06/16/2009   Diabetes mellitus without complication ([place]) [id number]   HYPERLIPIDEMIA 05/09/2009   HYPERTENSION 05/09/2009    Past Surgical History:  Procedure Laterality Date   TONSILLECTOMY       OB History   No obstetric history on file.     Family History  Problem Relation Age of Onset    Diabetes Mother    Hypertension Mother    Hyperlipidemia Mother    Diabetes Father    Heart disease Father    Diabetes Sister     Social History   Tobacco Use   Smoking status: Some Days    Packs/day: 0.10    Years: 23.00    Pack years: 2.30    Types: Cigarettes   Smokeless tobacco: Never  Vaping Use   Vaping Use: Never used  Substance Use Topics   Alcohol use: Not Currently    Comment: Occasional use: 4 drinks per occasion, once a month   Drug use: No    Home Medications Prior to Admission medications   Medication Sig Start Date End Date Taking? Authorizing Provider  HYDROcodone-acetaminophen (NORCO) 5-325 MG tablet Take 2 tablets by mouth every 4 (four) hours as needed. 10/22/20  Yes Daleen Bo, MD  acetaminophen (TYLENOL) 500 MG tablet Take 2 tablets (1,000 mg total) by mouth every 6 (six) hours as needed for mild pain or moderate pain. 11/17/17   Maczis, Barth Kirks, PA-C  albuterol (PROVENTIL HFA;VENTOLIN HFA) 108 (90 BASE) MCG/ACT inhaler Inhale 2 puffs into the lungs every 6 (six) hours as needed. For shortness of breath    [provider]  amLODipine-benazepril (LOTREL) 5-10 MG capsule Take 1 capsule by mouth daily. 10/29/17   [provider]  B-D UF III MINI PEN NEEDLES 31G X 5 MM MISC See admin instructions. 04/28/15   [provider]  cyclobenzaprine (FLEXERIL) 5 MG tablet Take 1 tablet twice a day as needed for Muscle spasms. 01/31/15   Elayne Snare, MD  diclofenac sodium (VOLTAREN) 1 % GEL Apply 2 g topically 4 (four) times daily. 01/30/16   Leo Grosser, MD  gabapentin (NEURONTIN) 400 MG capsule Take 400 mg by mouth 3 (three) times daily.    [provider]  glucose blood (ONETOUCH VERIO) test strip Use as instructed to check blood sugar 2 times daily Dx code E11.9 11/03/14   Elayne Snare, MD  Insulin Syringe-Needle U-100 (SAFETY-GLIDE 0.3CC SYR 29GX1/2) 29G X 1/2" 0.3 ML MISC Use as directed. 12/11/12   Hongalgi, Lenis Dickinson, MD  nystatin  cream (MYCOSTATIN) Apply to affected area 2 times daily 01/17/15   [place], Emily, PA-C  rosuvastatin (CRESTOR) 40 MG tablet Take 40 mg by mouth daily.    [provider]  SYMBICORT 160-4.5 MCG/ACT inhaler Inhale 2 puffs into the lungs 2 (two) times daily. 10/02/17   [provider]  TRESIBA FLEXTOUCH 200 UNIT/ML SOPN Inject 60 Units into the skin at bedtime. Patient taking differently: Inject 26 Units into the skin at bedtime. 01/17/15   Clayton Bibles, PA-C    Allergies    Ibuprofen, Peach flavor, Penicillins, and Strawberry extract  Review of Systems   Review of Systems  All other systems reviewed and are negative.  Physical Exam Updated Vital Signs BP (!) 149/77   Pulse 92   Temp 98.2 F (36.8 C) (Oral)   Resp 18   LMP 12/08/2012   SpO2 96%   Physical Exam Vitals and nursing note reviewed.  Constitutional:      General: She is not in acute distress.    Appearance: She is well-developed. She is obese. She is not toxic-appearing or diaphoretic.     Comments: Patient was examined while sitting in a chair in the examination room.  HENT:     Head: Normocephalic and atraumatic.     Right Ear: External ear normal.     Left Ear: External ear normal.  Eyes:     Conjunctiva/sclera: Conjunctivae normal.     Pupils: Pupils are equal, round, and reactive to light.  Neck:     Trachea: Phonation normal.  Cardiovascular:     Rate and Rhythm: Normal rate.  Pulmonary:     Effort: Pulmonary effort is normal.  Abdominal:     General: There is no distension.     Comments: Very large panniculus  Musculoskeletal:        General: No swelling, tenderness or deformity. Normal range of motion.     Cervical back: Normal range of motion and neck supple.  Skin:    General: Skin is warm and dry.  Neurological:     Mental Status: She is alert and oriented to person, place, and time.     Cranial Nerves: No cranial nerve deficit.     Sensory: No sensory deficit.     Motor: No  abnormal muscle tone.     Coordination: Coordination normal.  Psychiatric:        Mood and Affect: Mood normal.        Behavior: Behavior normal.        Thought Content: Thought content normal.        Judgment: Judgment normal.    ED Results / Procedures / Treatments   Labs (all labs ordered  are listed, but only abnormal results are displayed) Labs Reviewed - No data to display  EKG None  Radiology No results found.  Procedures Procedures   Medications Ordered in ED Medications  HYDROcodone-acetaminophen (NORCO/VICODIN) 5-325 MG per tablet 1 tablet (1 tablet Oral Given 10/22/20 1645)    ED Course  I have reviewed the triage vital signs and the nursing notes.  Pertinent labs & imaging results that were available during my care of the patient were reviewed by me and considered in my medical decision making (see chart for details).    MDM Rules/Calculators/A&P                           Patient Vitals for the past 24 hrs:  BP Temp Temp src Pulse Resp SpO2  10/22/20 1621 (!) 149/77 98.2 F (36.8 C) Oral 92 18 96 %    5:56 PM Reevaluation with update and discussion. After initial assessment and treatment, an updated evaluation reveals no change in clinical status, findings cussed with the patient and all questions were answered.  Patient states she is more comfortable at this time. Daleen Bo   Medical Decision Making:  This patient is presenting for evaluation of acute on chronic discomfort, which does require a range of treatment options, and is a complaint that involves a low risk of morbidity and mortality. The differential diagnoses include contusion, sprain, fracture, chronic disability. I decided to review old records, and in summary morbidly obese female presenting with nonspecific complaints after a fall today.  I did not require additional historical information from anyone.  Critical Interventions-clinical evaluation, medication treatment,  reevaluation  After These Interventions, the Patient was reevaluated and was found stable for discharge.  Suspect acute and chronic discomfort, but no serious injury from fall today.  CRITICAL CARE-no Performed by: Daleen Bo  Nursing Notes Reviewed/ Care Coordinated Applicable Imaging Reviewed Interpretation of Laboratory Data incorporated into ED treatment  The patient appears reasonably screened and/or stabilized for discharge and I doubt any other medical condition or other [facility] requiring further screening, evaluation, or treatment in the ED at this time prior to discharge.  Plan: Home Medications-continue usual; Home Treatments-gradual advance activity, symptomatic cold and heat therapy; return here if the recommended treatment, does not improve the symptoms; Recommended follow up-PCP, PRN     Final Clinical Impression(s) / ED Diagnoses Final diagnoses:  Fall, initial encounter  Generalized muscle ache    Rx / DC Orders ED Discharge Orders          Ordered    HYDROcodone-acetaminophen (NORCO) 5-325 MG tablet  Every 4 hours PRN        10/22/20 1753             Daleen Bo, MD 10/22/20 1757

## 2020-10-22 NOTE — Discharge Instructions (Addendum)
Use ice on the sore areas which you have for 2 days, after that use heat.  Typically it helps to use these for about 30 minutes, 3-4 times a day.  Try taking Tylenol or Motrin for your pain.  Use the pain reliever only if needed for severe pain.  Do not drink alcohol or drive when you are using the pain reliever.

## 2021-03-15 ENCOUNTER — Institutional Professional Consult (permissible substitution): Payer: Medicare Other | Admitting: Plastic Surgery

## 2021-03-21 ENCOUNTER — Inpatient Hospital Stay (HOSPITAL_COMMUNITY)
Admission: EM | Admit: 2021-03-21 | Discharge: 2021-03-24 | DRG: 603 | Disposition: A | Discharge status: Home Health | Payer: Medicare Other | Attending: Internal Medicine | Admitting: Internal Medicine

## 2021-03-21 ENCOUNTER — Emergency Department (HOSPITAL_COMMUNITY): Payer: Medicare Other

## 2021-03-21 ENCOUNTER — Encounter (HOSPITAL_COMMUNITY): Payer: Self-pay

## 2021-03-21 ENCOUNTER — Other Ambulatory Visit: Payer: Self-pay

## 2021-03-21 DIAGNOSIS — N179 Acute kidney failure, unspecified: Secondary | ICD-10-CM | POA: Diagnosis present

## 2021-03-21 DIAGNOSIS — E1169 Type 2 diabetes mellitus with other specified complication: Secondary | ICD-10-CM | POA: Diagnosis present

## 2021-03-21 DIAGNOSIS — E1159 Type 2 diabetes mellitus with other circulatory complications: Secondary | ICD-10-CM | POA: Diagnosis present

## 2021-03-21 DIAGNOSIS — Z794 Long term (current) use of insulin: Secondary | ICD-10-CM

## 2021-03-21 DIAGNOSIS — Z833 Family history of diabetes mellitus: Secondary | ICD-10-CM

## 2021-03-21 DIAGNOSIS — R748 Abnormal levels of other serum enzymes: Secondary | ICD-10-CM | POA: Diagnosis present

## 2021-03-21 DIAGNOSIS — I152 Hypertension secondary to endocrine disorders: Secondary | ICD-10-CM | POA: Diagnosis present

## 2021-03-21 DIAGNOSIS — L03311 Cellulitis of abdominal wall: Secondary | ICD-10-CM | POA: Diagnosis not present

## 2021-03-21 DIAGNOSIS — N838 Other noninflammatory disorders of ovary, fallopian tube and broad ligament: Secondary | ICD-10-CM | POA: Diagnosis present

## 2021-03-21 DIAGNOSIS — E785 Hyperlipidemia, unspecified: Secondary | ICD-10-CM | POA: Diagnosis present

## 2021-03-21 DIAGNOSIS — E162 Hypoglycemia, unspecified: Principal | ICD-10-CM

## 2021-03-21 DIAGNOSIS — E1122 Type 2 diabetes mellitus with diabetic chronic kidney disease: Secondary | ICD-10-CM | POA: Diagnosis present

## 2021-03-21 DIAGNOSIS — F17210 Nicotine dependence, cigarettes, uncomplicated: Secondary | ICD-10-CM | POA: Diagnosis present

## 2021-03-21 DIAGNOSIS — Z8249 Family history of ischemic heart disease and other diseases of the circulatory system: Secondary | ICD-10-CM

## 2021-03-21 DIAGNOSIS — L0390 Cellulitis, unspecified: Secondary | ICD-10-CM | POA: Diagnosis present

## 2021-03-21 DIAGNOSIS — Z20822 Contact with and (suspected) exposure to covid-19: Secondary | ICD-10-CM | POA: Diagnosis present

## 2021-03-21 DIAGNOSIS — E11649 Type 2 diabetes mellitus with hypoglycemia without coma: Secondary | ICD-10-CM | POA: Diagnosis present

## 2021-03-21 DIAGNOSIS — Z6841 Body Mass Index (BMI) 40.0 and over, adult: Secondary | ICD-10-CM

## 2021-03-21 DIAGNOSIS — N1832 Chronic kidney disease, stage 3b: Secondary | ICD-10-CM | POA: Diagnosis present

## 2021-03-21 DIAGNOSIS — J42 Unspecified chronic bronchitis: Secondary | ICD-10-CM | POA: Diagnosis present

## 2021-03-21 DIAGNOSIS — I129 Hypertensive chronic kidney disease with stage 1 through stage 4 chronic kidney disease, or unspecified chronic kidney disease: Secondary | ICD-10-CM | POA: Diagnosis present

## 2021-03-21 DIAGNOSIS — Z66 Do not resuscitate: Secondary | ICD-10-CM | POA: Diagnosis present

## 2021-03-21 LAB — GLUCOSE, CAPILLARY
Glucose-Capillary: 120 mg/dL — ABNORMAL HIGH (ref 70–99)
Glucose-Capillary: 70 mg/dL (ref 70–99)

## 2021-03-21 LAB — CBC WITH DIFFERENTIAL/PLATELET
Abs Immature Granulocytes: 0.03 10*3/uL (ref 0.00–0.07)
Basophils Absolute: 0 10*3/uL (ref 0.0–0.1)
Basophils Relative: 0 %
Eosinophils Absolute: 0 10*3/uL (ref 0.0–0.5)
Eosinophils Relative: 0 %
HCT: 47.9 % — ABNORMAL HIGH (ref 36.0–46.0)
Hemoglobin: 15.1 g/dL — ABNORMAL HIGH (ref 12.0–15.0)
Immature Granulocytes: 1 %
Lymphocytes Relative: 20 %
Lymphs Abs: 1.1 10*3/uL (ref 0.7–4.0)
MCH: 27.7 pg (ref 26.0–34.0)
MCHC: 31.5 g/dL (ref 30.0–36.0)
MCV: 87.9 fL (ref 80.0–100.0)
Monocytes Absolute: 0.4 10*3/uL (ref 0.1–1.0)
Monocytes Relative: 7 %
Neutro Abs: 3.9 10*3/uL (ref 1.7–7.7)
Neutrophils Relative %: 72 %
Platelets: 212 10*3/uL (ref 150–400)
RBC: 5.45 MIL/uL — ABNORMAL HIGH (ref 3.87–5.11)
RDW: 14.9 % (ref 11.5–15.5)
WBC: 5.4 10*3/uL (ref 4.0–10.5)
nRBC: 0 % (ref 0.0–0.2)

## 2021-03-21 LAB — LACTIC ACID, PLASMA: Lactic Acid, Venous: 1.2 mmol/L (ref 0.5–1.9)

## 2021-03-21 LAB — COMPREHENSIVE METABOLIC PANEL
ALT: 23 U/L (ref 0–44)
AST: 57 U/L — ABNORMAL HIGH (ref 15–41)
Albumin: 3.5 g/dL (ref 3.5–5.0)
Alkaline Phosphatase: 85 U/L (ref 38–126)
Anion gap: 10 (ref 5–15)
BUN: 14 mg/dL (ref 6–20)
CO2: 25 mmol/L (ref 22–32)
Calcium: 9.1 mg/dL (ref 8.9–10.3)
Chloride: 100 mmol/L (ref 98–111)
Creatinine, Ser: 1.92 mg/dL — ABNORMAL HIGH (ref 0.44–1.00)
GFR, Estimated: 31 mL/min — ABNORMAL LOW (ref >60)
Glucose, Bld: 93 mg/dL (ref 70–99)
Potassium: 3.6 mmol/L (ref 3.5–5.1)
Sodium: 135 mmol/L (ref 135–145)
Total Bilirubin: 0.6 mg/dL (ref 0.3–1.2)
Total Protein: 8.5 g/dL — ABNORMAL HIGH (ref 6.5–8.1)

## 2021-03-21 LAB — CBG MONITORING, ED
Glucose-Capillary: 41 mg/dL — CL (ref 70–99)
Glucose-Capillary: 65 mg/dL — ABNORMAL LOW (ref 70–99)
Glucose-Capillary: 85 mg/dL (ref 70–99)

## 2021-03-21 LAB — RESP PANEL BY RT-PCR (FLU A&B, COVID) ARPGX2
Influenza A by PCR: NEGATIVE
Influenza B by PCR: NEGATIVE
SARS Coronavirus 2 by RT PCR: NEGATIVE

## 2021-03-21 LAB — CK: Total CK: 1060 U/L — ABNORMAL HIGH (ref 38–234)

## 2021-03-21 IMAGING — CT CT ABD-PELV W/O CM
2 of 4 series · 15 of 46 positions shown, 17 images · non-contrast
Comparison: None.

CLINICAL DATA: Sepsis, abdominal pain

EXAM:
CT ABDOMEN AND PELVIS WITHOUT CONTRAST
TECHNIQUE: Multidetector CT imaging of the abdomen and pelvis was performed
following the standard protocol without IV contrast. Unenhanced CT
was performed per clinician order. Lack of IV contrast limits
sensitivity and specificity, especially for evaluation of
abdominal/pelvic solid viscera.

[Series 2: axial st · axial · 0.96mm/px · z∈[-422,+8]mm · 12 of 102 slices shown, 14 images]
[im 8/102  soft-tissue]
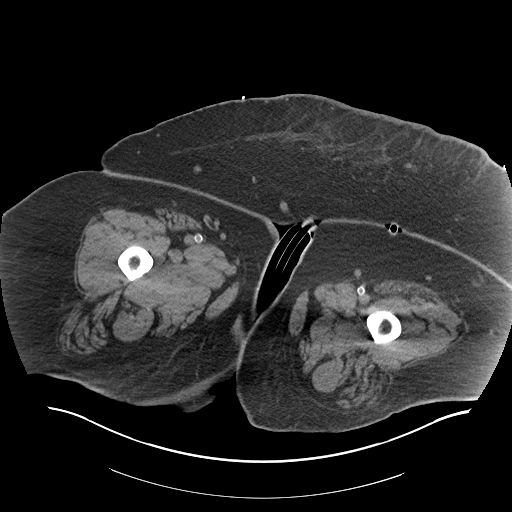
[im 8/102  bone]
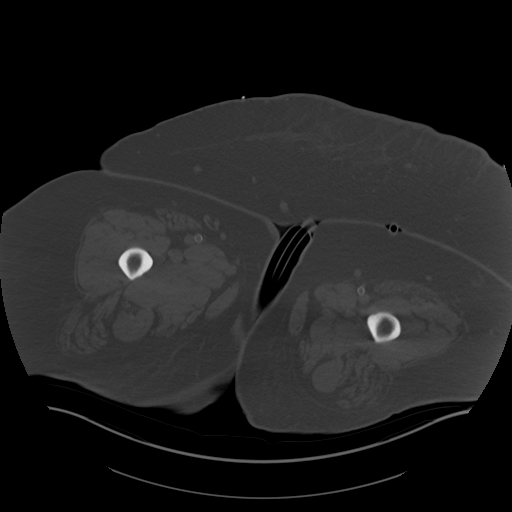
[im 16/102  soft-tissue]
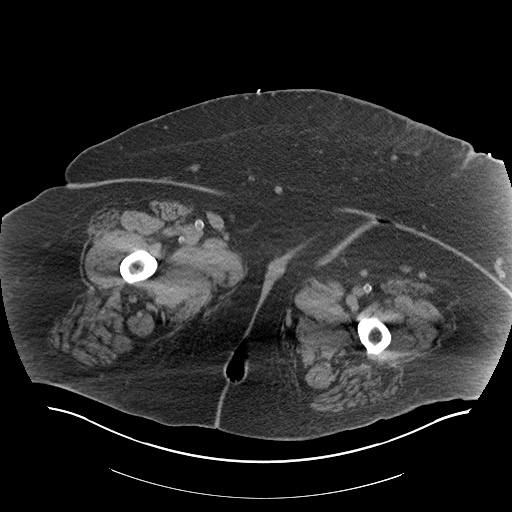
[im 24/102  soft-tissue]
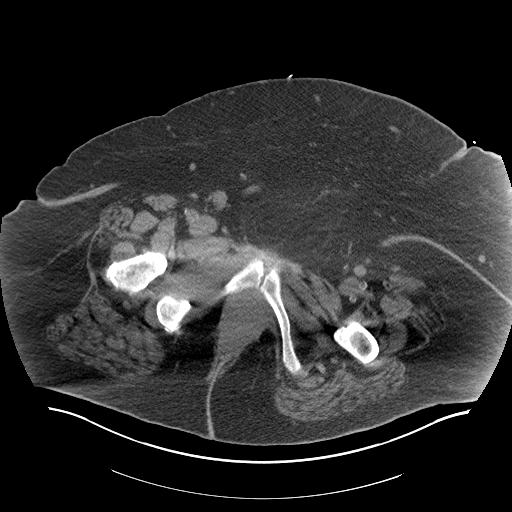
[im 32/102  soft-tissue]
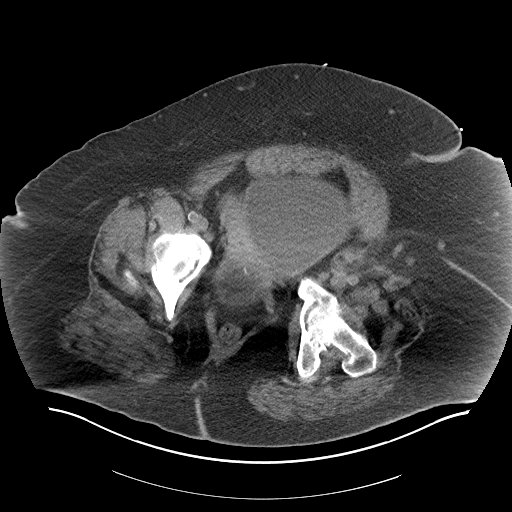
[im 39/102  soft-tissue]
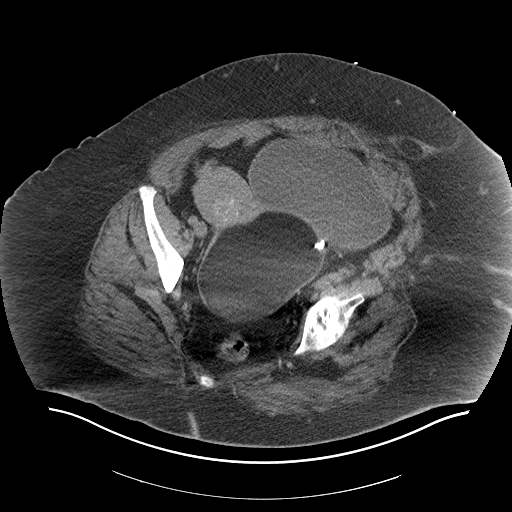
[im 47/102  soft-tissue]
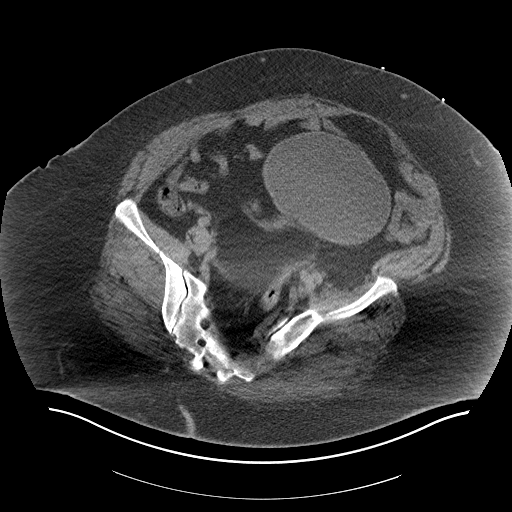
[im 55/102  soft-tissue]
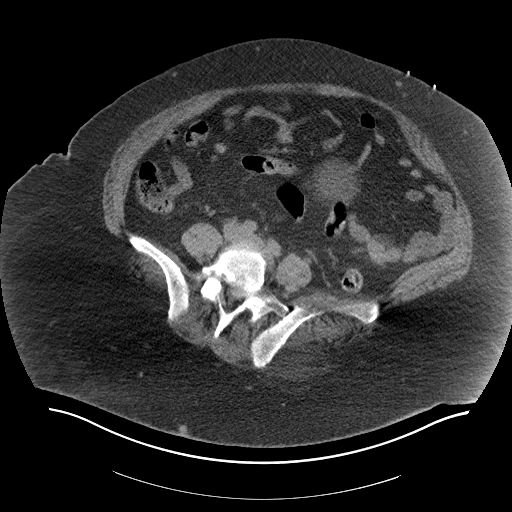
[im 63/102  soft-tissue]
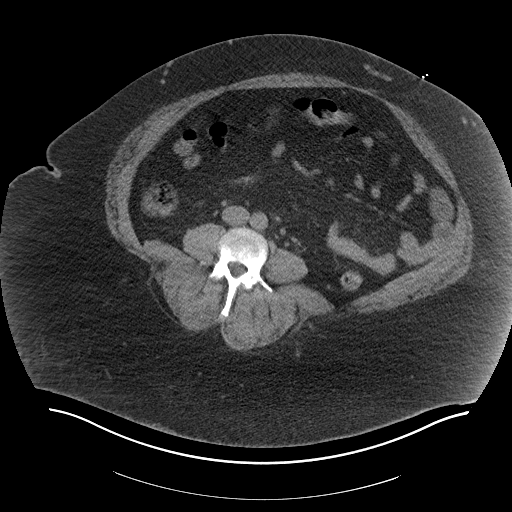
[im 70/102  soft-tissue]
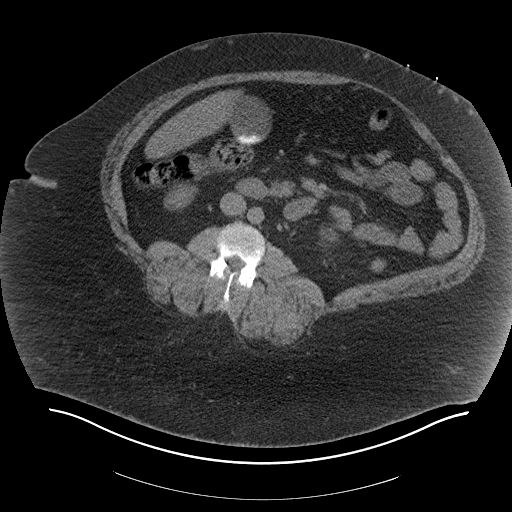
[im 70/102  bone]
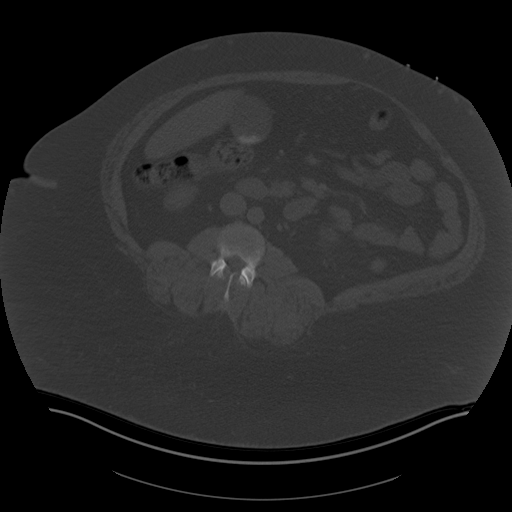
[im 78/102  soft-tissue]
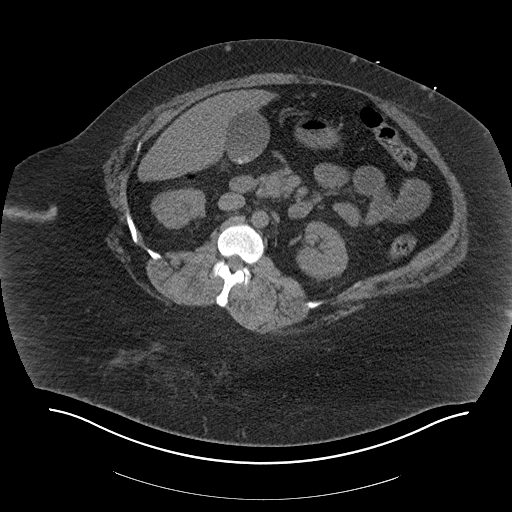
[im 86/102  soft-tissue]
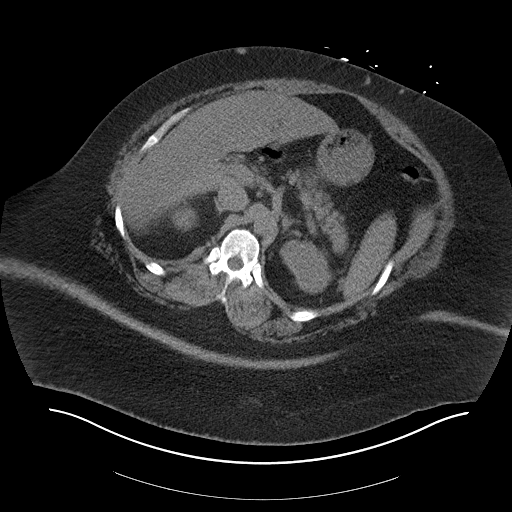
[im 94/102  soft-tissue]
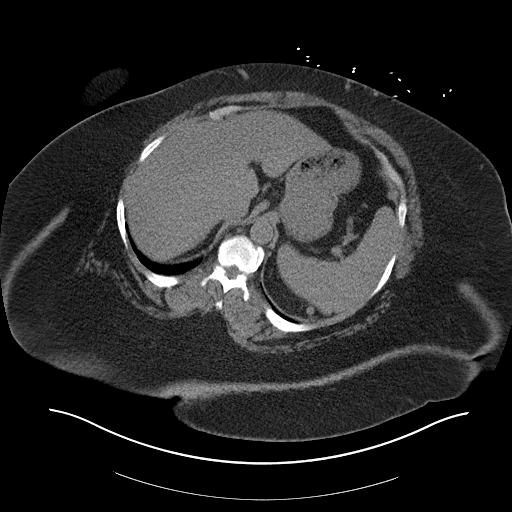

[Series 5: coronal st · coronal · 1.05mm/px · 3 of 219 slices shown]
[im 73/219  soft-tissue]
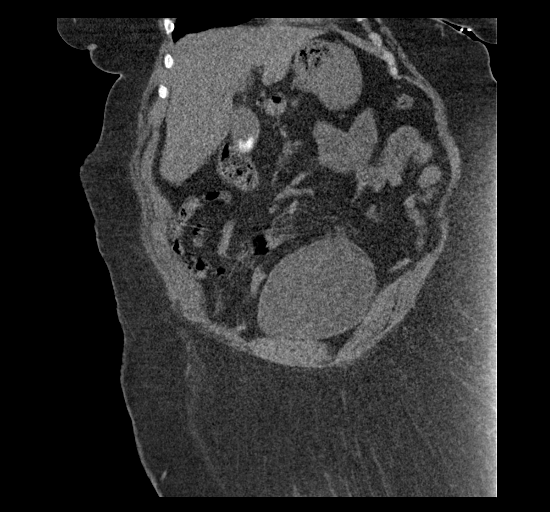
[im 97/219  soft-tissue]
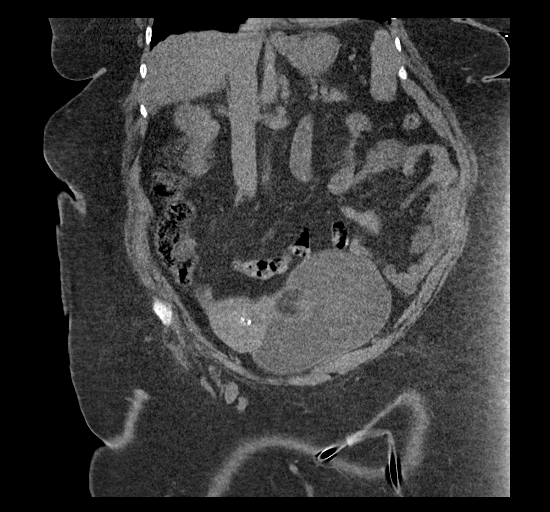
[im 122/219  soft-tissue]
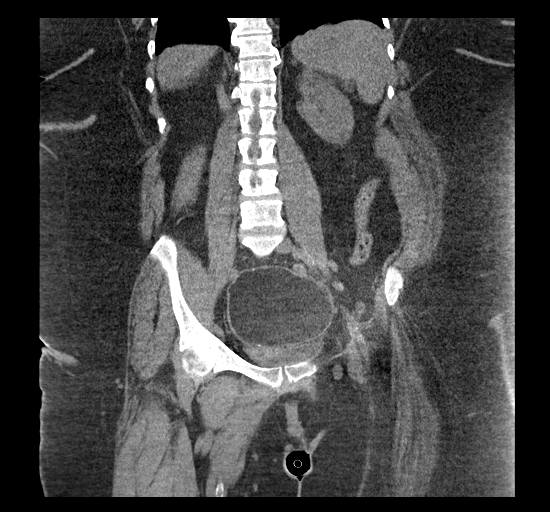

[15 of 46 positions shown; findings below may reference images not displayed]

FINDINGS: Lower chest: No acute pleural or parenchymal lung disease.

Hepatobiliary: Calcified gallstones without cholecystitis.
Unremarkable unenhanced appearance of the liver.

Pancreas: Unremarkable unenhanced appearance.

Spleen: Unremarkable unenhanced appearance.

Adrenals/Urinary Tract: No urinary tract calculi or obstructive
uropathy. The adrenals are unremarkable. There is moderate
distention of the bladder without filling defect.

Stomach/Bowel: No bowel obstruction or ileus. Normal appendix right
lower quadrant. No bowel wall thickening or inflammatory change.

Vascular/Lymphatic: No significant vascular findings are present. No
enlarged abdominal or pelvic lymph nodes.

Reproductive: Intrauterine calcifications most consistent with
degenerating fibroids. There is a large left ovarian mass containing
macroscopic fat and calcification, measuring 11.0 x 12.4 x 9.3 cm
consistent with ovarian dermoid. No right adnexal mass.

Other: No free fluid or free gas.  No abdominal wall hernia.

Musculoskeletal: There are no acute bony abnormalities. There is
severe bilateral hip osteoarthritis, left greater than right.

Imaging was performed through the perineum, with no inflammatory
changes in the perineum or labia to suggest infection. However,
there is subcutaneous fat stranding and dermal thickening within the
inferior margin of the abdominal pannus, not completely imaged on
this study, which could reflect abdominal wall cellulitis. No fluid
collection or evidence of abscess.

Reconstructed images demonstrate no additional findings.
IMPRESSION: 1. Large left ovarian mass consistent with dermoid. Maximal
dimension 12.4 cm.
2. Subcutaneous fat stranding and dermal thickening within the
inferior margin of the abdominal wall pannus, which could reflect
cellulitis. No fluid collection or abscess.
3. Cholelithiasis without cholecystitis.
4. Fibroid uterus.
5. Severe bilateral hip osteoarthritis, left greater than right. No
acute fractures.

## 2021-03-21 MED ORDER — VANCOMYCIN HCL IN DEXTROSE 1-5 GM/200ML-% IV SOLN
1000.0000 mg | Freq: Once | INTRAVENOUS | Status: AC
  Administered 2021-03-21: 1000 mg via INTRAVENOUS
  Filled 2021-03-21: qty 200

## 2021-03-21 MED ORDER — ACETAMINOPHEN 650 MG RE SUPP: 650.0000 mg | Freq: Four times a day (QID) | RECTAL | Status: DC | PRN

## 2021-03-21 MED ORDER — SODIUM CHLORIDE 0.9 % IV SOLN
2.0000 g | Freq: Once | INTRAVENOUS | Status: AC
  Administered 2021-03-21: 2 g via INTRAVENOUS
  Filled 2021-03-21: qty 2

## 2021-03-21 MED ORDER — CEFAZOLIN SODIUM-DEXTROSE 2-4 GM/100ML-% IV SOLN
2.0000 g | Freq: Three times a day (TID) | INTRAVENOUS | Status: DC
  Administered 2021-03-21 – 2021-03-24 (×8): 2 g via INTRAVENOUS
  Filled 2021-03-21 (×9): qty 100

## 2021-03-21 MED ORDER — SODIUM CHLORIDE 0.9 % IV SOLN: INTRAVENOUS | Status: AC

## 2021-03-21 MED ORDER — BENAZEPRIL HCL 10 MG PO TABS
10.0000 mg | ORAL_TABLET | Freq: Every day | ORAL | Status: DC
  Administered 2021-03-22: 10:00:00 10 mg via ORAL
  Filled 2021-03-21: qty 1

## 2021-03-21 MED ORDER — ONDANSETRON HCL 4 MG/2ML IJ SOLN: 4.0000 mg | Freq: Four times a day (QID) | INTRAMUSCULAR | Status: DC | PRN

## 2021-03-21 MED ORDER — GABAPENTIN 400 MG PO CAPS
400.0000 mg | ORAL_CAPSULE | Freq: Every day | ORAL | Status: DC | PRN
  Filled 2021-03-21: qty 1

## 2021-03-21 MED ORDER — MORPHINE SULFATE (PF) 4 MG/ML IV SOLN
4.0000 mg | Freq: Once | INTRAVENOUS | Status: AC
  Administered 2021-03-21: 4 mg via INTRAVENOUS
  Filled 2021-03-21: qty 1

## 2021-03-21 MED ORDER — CYCLOBENZAPRINE HCL 5 MG PO TABS
5.0000 mg | ORAL_TABLET | Freq: Two times a day (BID) | ORAL | Status: DC | PRN
  Administered 2021-03-21: 5 mg via ORAL
  Filled 2021-03-21 (×2): qty 1

## 2021-03-21 MED ORDER — ALBUTEROL SULFATE (2.5 MG/3ML) 0.083% IN NEBU: 2.5000 mg | INHALATION_SOLUTION | Freq: Four times a day (QID) | RESPIRATORY_TRACT | Status: DC | PRN

## 2021-03-21 MED ORDER — AMLODIPINE BESYLATE 5 MG PO TABS
5.0000 mg | ORAL_TABLET | Freq: Every day | ORAL | Status: DC
  Administered 2021-03-22 – 2021-03-24 (×2): 5 mg via ORAL
  Filled 2021-03-21 (×3): qty 1

## 2021-03-21 MED ORDER — ONDANSETRON HCL 4 MG PO TABS: 4.0000 mg | ORAL_TABLET | Freq: Four times a day (QID) | ORAL | Status: DC | PRN

## 2021-03-21 MED ORDER — ACETAMINOPHEN 325 MG PO TABS
650.0000 mg | ORAL_TABLET | Freq: Four times a day (QID) | ORAL | Status: DC | PRN
  Filled 2021-03-21: qty 2

## 2021-03-21 MED ORDER — DEXTROSE 50 % IV SOLN
50.0000 mL | Freq: Once | INTRAVENOUS | Status: AC
  Administered 2021-03-21: 50 mL via INTRAVENOUS
  Filled 2021-03-21: qty 50

## 2021-03-21 MED ORDER — MOMETASONE FURO-FORMOTEROL FUM 200-5 MCG/ACT IN AERO
2.0000 | INHALATION_SPRAY | Freq: Two times a day (BID) | RESPIRATORY_TRACT | Status: DC
  Administered 2021-03-22 – 2021-03-24 (×5): 2 via RESPIRATORY_TRACT
  Filled 2021-03-21: qty 8.8

## 2021-03-21 MED ORDER — ENOXAPARIN SODIUM 80 MG/0.8ML IJ SOSY
70.0000 mg | PREFILLED_SYRINGE | INTRAMUSCULAR | Status: DC
  Administered 2021-03-21 – 2021-03-23 (×3): 70 mg via SUBCUTANEOUS
  Filled 2021-03-21 (×3): qty 0.8

## 2021-03-21 MED ORDER — SODIUM CHLORIDE 0.9 % IV BOLUS
1000.0000 mL | Freq: Once | INTRAVENOUS | Status: AC
  Administered 2021-03-21: 1000 mL via INTRAVENOUS

## 2021-03-21 MED ORDER — ALBUTEROL SULFATE HFA 108 (90 BASE) MCG/ACT IN AERS: 2.0000 | INHALATION_SPRAY | Freq: Four times a day (QID) | RESPIRATORY_TRACT | Status: DC | PRN

## 2021-03-21 NOTE — ED Provider Notes (Signed)
[facility] DEPT Provider Note   CSN: [id number] Arrival date & time: 03/21/21  1426     History Chief Complaint  Patient presents with   Hypoglycemia    Makayla [name] is a 55 y.o. female history of CKD, diabetes, hypertension, here presenting with multiple complaints.  Patient states that she noticed some pain in the left thigh area.  She states that it is behind her pannus.  She states that she has weight loss about 200 pounds over several weeks.  Patient states that she has some leg spasms.  She has not eaten much since yesterday and has not used her insulin.  EMS got there and she was noted to have sugar of 48.  She also recently had bronchitis.  Patient denies any exposure to COVID.  The history is provided by the patient.      Past Medical History:  Diagnosis Date   Cellulitis    Chronic bronchitis ([place])    CKD (chronic kidney disease), stage III ([place])    Diabetes mellitus    Hyperlipidemia    Hypertension    Neuropathy     Patient Active Problem List   Diagnosis Date Noted   Renal failure, acute on chronic ([place]) 12/10/2012   Hyperosmolar non-ketotic state in patient with type 2 diabetes mellitus ([place]) 12/08/2012   Hypokalemia 12/08/2012   ARF (acute renal failure) ([place]) 12/08/2012   Dehydration 12/08/2012   Obesity, morbid ([place]) 12/08/2012   Tobacco abuse 12/08/2012   SNORING 06/16/2009   Diabetes mellitus without complication ([place]) 00/34/9179   HYPERLIPIDEMIA 05/09/2009   HYPERTENSION 05/09/2009    Past Surgical History:  Procedure Laterality Date   TONSILLECTOMY       OB History   No obstetric history on file.     Family History  Problem Relation Age of Onset   Diabetes Mother    Hypertension Mother    Hyperlipidemia Mother    Diabetes Father    Heart disease Father    Diabetes Sister     Social History   Tobacco Use   Smoking status: Some Days    Packs/day: 0.10    Years: 23.00    Pack years: 2.30    Types:  Cigarettes   Smokeless tobacco: Never  Vaping Use   Vaping Use: Never used  Substance Use Topics   Alcohol use: Not Currently    Comment: Occasional use: 4 drinks per occasion, once a month   Drug use: No    Home Medications Prior to Admission medications   Medication Sig Start Date End Date Taking? Authorizing Provider  acetaminophen (TYLENOL) 500 MG tablet Take 2 tablets (1,000 mg total) by mouth every 6 (six) hours as needed for mild pain or moderate pain. 11/17/17   Maczis, Barth Kirks, PA-C  albuterol (PROVENTIL HFA;VENTOLIN HFA) 108 (90 BASE) MCG/ACT inhaler Inhale 2 puffs into the lungs every 6 (six) hours as needed. For shortness of breath    [provider]  amLODipine-benazepril (LOTREL) 5-10 MG capsule Take 1 capsule by mouth daily. 10/29/17   [provider]  B-D UF III MINI PEN NEEDLES 31G X 5 MM MISC See admin instructions. 04/28/15   [provider]  cyclobenzaprine (FLEXERIL) 5 MG tablet Take 1 tablet twice a day as needed for Muscle spasms. 01/31/15   Elayne Snare, MD  diclofenac sodium (VOLTAREN) 1 % GEL Apply 2 g topically 4 (four) times daily. 01/30/16   Leo Grosser, MD  gabapentin (NEURONTIN) 400 MG capsule Take  400 mg by mouth 3 (three) times daily.    [provider]  glucose blood (ONETOUCH VERIO) test strip Use as instructed to check blood sugar 2 times daily Dx code E11.9 11/03/14   Elayne Snare, MD  HYDROcodone-acetaminophen (NORCO) 5-325 MG tablet Take 2 tablets by mouth every 4 (four) hours as needed. 10/22/20   Daleen Bo, MD  Insulin Syringe-Needle U-100 (SAFETY-GLIDE 0.3CC SYR 29GX1/2) 29G X 1/2" 0.3 ML MISC Use as directed. 12/11/12   Hongalgi, Lenis Dickinson, MD  nystatin cream (MYCOSTATIN) Apply to affected area 2 times daily 01/17/15   [place], Emily, PA-C  rosuvastatin (CRESTOR) 40 MG tablet Take 40 mg by mouth daily.    [provider]  SYMBICORT 160-4.5 MCG/ACT inhaler Inhale 2 puffs into the lungs 2 (two) times daily.  10/02/17   [provider]  TRESIBA FLEXTOUCH 200 UNIT/ML SOPN Inject 60 Units into the skin at bedtime. Patient taking differently: Inject 26 Units into the skin at bedtime. 01/17/15   Clayton Bibles, PA-C    Allergies    Ibuprofen, Peach flavor, Penicillins, and Strawberry extract  Review of Systems   Review of Systems  Gastrointestinal:  Positive for abdominal pain.  Musculoskeletal:        Left leg pain   All other systems reviewed and are negative.  Physical Exam Updated Vital Signs BP (!) 167/104    Pulse (!) 103    Temp 97.8 F (36.6 C) (Oral)    Resp (!) 24    Ht 5\' 4"  (1.626 m)    Wt (!) 140.6 kg    LMP 12/08/2012    SpO2 100%    BMI 53.21 kg/m   Physical Exam Vitals and nursing note reviewed.  Constitutional:      Appearance: Normal appearance.  HENT:     Head: Normocephalic.     Nose: Nose normal.     Mouth/Throat:     Mouth: Mucous membranes are moist.  Eyes:     Extraocular Movements: Extraocular movements intact.     Pupils: Pupils are equal, round, and reactive to light.  Cardiovascular:     Rate and Rhythm: Normal rate and regular rhythm.     Pulses: Normal pulses.     Heart sounds: Normal heart sounds.  Pulmonary:     Effort: Pulmonary effort is normal.     Breath sounds: Normal breath sounds.  Abdominal:     Comments: Patient has a large pannus, under the left pannus, there is a area of purulent drainage.  There is diffuse cellulitis under the pannus.  There is no obvious yeast infection  Musculoskeletal:        General: Normal range of motion.     Cervical back: Normal range of motion and neck supple.  Skin:    General: Skin is warm.     Capillary Refill: Capillary refill takes less than 2 seconds.     Findings: Erythema present.  Neurological:     General: No focal deficit present.     Mental Status: She is alert and oriented to person, place, and time.  Psychiatric:        Mood and Affect: Mood normal.        Behavior: Behavior normal.     ED Results / Procedures / Treatments   Labs (all labs ordered are listed, but only abnormal results are displayed) Labs Reviewed  CBC WITH DIFFERENTIAL/PLATELET - Abnormal; Notable for the following components:      Result Value  RBC 5.45 (*)    Hemoglobin 15.1 (*)    HCT 47.9 (*)    All other components within normal limits  COMPREHENSIVE METABOLIC PANEL - Abnormal; Notable for the following components:   Creatinine, Ser 1.92 (*)    Total Protein 8.5 (*)    AST 57 (*)    GFR, Estimated 31 (*)    All other components within normal limits  CK - Abnormal; Notable for the following components:   Total CK 1,060 (*)    All other components within normal limits  CBG MONITORING, ED - Abnormal; Notable for the following components:   Glucose-Capillary 41 (*)    All other components within normal limits  CBG MONITORING, ED - Abnormal; Notable for the following components:   Glucose-Capillary 65 (*)    All other components within normal limits  RESP PANEL BY RT-PCR (FLU A&B, COVID) ARPGX2  CULTURE, BLOOD (ROUTINE X 2)  CULTURE, BLOOD (ROUTINE X 2)  LACTIC ACID, PLASMA  LACTIC ACID, PLASMA  CBG MONITORING, ED    EKG None  Radiology CT ABDOMEN PELVIS WO CONTRAST  Result Date: 03/21/2021 CLINICAL DATA:  Sepsis, abdominal pain EXAM: CT ABDOMEN AND PELVIS WITHOUT CONTRAST TECHNIQUE: Multidetector CT imaging of the abdomen and pelvis was performed following the standard protocol without IV contrast. Unenhanced CT was performed per clinician order. Lack of IV contrast limits sensitivity and specificity, especially for evaluation of abdominal/pelvic solid viscera. COMPARISON:  None. FINDINGS: Lower chest: No acute pleural or parenchymal lung disease. Hepatobiliary: Calcified gallstones without cholecystitis. Unremarkable unenhanced appearance of the liver. Pancreas: Unremarkable unenhanced appearance. Spleen: Unremarkable unenhanced appearance. Adrenals/Urinary Tract: No urinary tract  calculi or obstructive uropathy. The adrenals are unremarkable. There is moderate distention of the bladder without filling defect. Stomach/Bowel: No bowel obstruction or ileus. Normal appendix right lower quadrant. No bowel wall thickening or inflammatory change. Vascular/Lymphatic: No significant vascular findings are present. No enlarged abdominal or pelvic lymph nodes. Reproductive: Intrauterine calcifications most consistent with degenerating fibroids. There is a large left ovarian mass containing macroscopic fat and calcification, measuring 11.0 x 12.4 x 9.3 cm consistent with ovarian dermoid. No right adnexal mass. Other: No free fluid or free gas.  No abdominal wall hernia. Musculoskeletal: There are no acute bony abnormalities. There is severe bilateral hip osteoarthritis, left greater than right. Imaging was performed through the perineum, with no inflammatory changes in the perineum or labia to suggest infection. However, there is subcutaneous fat stranding and dermal thickening within the inferior margin of the abdominal pannus, not completely imaged on this study, which could reflect abdominal wall cellulitis. No fluid collection or evidence of abscess. Reconstructed images demonstrate no additional findings. IMPRESSION: 1. 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 Maximal dimension 12.4 cm. 2. Subcutaneous fat stranding and dermal thickening within the inferior margin of the abdominal wall pannus, which could reflect cellulitis. No fluid collection or abscess. 3. Cholelithiasis without cholecystitis. 4. Fibroid uterus. 5. Severe bilateral hip osteoarthritis, left greater than right. No acute fractures. Electronically Signed   By: Randa Ngo M.D.   On: 03/21/2021 17:28   DG Chest 2 View  Result Date: 03/21/2021 CLINICAL DATA:  Short of breath, fell yesterday, back pain EXAM: CHEST - 2 VIEW COMPARISON:  12/08/2012 FINDINGS: Frontal and lateral views of the chest demonstrate an  unremarkable cardiac silhouette. No acute airspace disease, effusion, or pneumothorax. There are no acute bony abnormalities. IMPRESSION: 1. No acute intrathoracic process. Electronically Signed   By: Diana Eves.D.  On: 03/21/2021 15:17    Procedures Procedures   CRITICAL CARE Performed by: Wandra Arthurs   Total critical care time: 30 minutes  Critical care time was exclusive of separately billable procedures and treating other patients.  Critical care was necessary to treat or prevent imminent or life-threatening deterioration.  Critical care was time spent personally by me on the following activities: development of treatment plan with patient and/or surrogate as well as nursing, discussions with consultants, evaluation of patient's response to treatment, examination of patient, obtaining history from patient or surrogate, ordering and performing treatments and interventions, ordering and review of laboratory studies, ordering and review of radiographic studies, pulse oximetry and re-evaluation of patient's condition.   Medications Ordered in ED Medications  vancomycin (VANCOCIN) IVPB 1000 mg/200 mL premix (has no administration in time range)  sodium chloride 0.9 % bolus 1,000 mL (1,000 mLs Intravenous New Bag/Given 03/21/21 1658)  dextrose 50 % solution 50 mL (50 mLs Intravenous Given 03/21/21 1700)  ceFEPIme (MAXIPIME) 2 g in sodium chloride 0.9 % 100 mL IVPB (2 g Intravenous New Bag/Given 03/21/21 1659)    ED Course  I have reviewed the triage vital signs and the nursing notes.  Pertinent labs & imaging results that were available during my care of the patient were reviewed by me and considered in my medical decision making (see chart for details).    MDM Rules/Calculators/A&P                           Makayla [name] is a 55 y.o. female Here presenting with left leg pain and  Cellulitis under the left pannus. Patient is noted to be hypoglycemic as well. Will do sepsis  workup with cbc, cmp, lactate, cultures, CT ab/pel. Will give IV antibiotics and recheck her blood sugar.   5:50 PM WBC is normal. Lactate nl. Initial sugar was 41 and given food and D50 and glucose increased to 85. CK is 1000. CT showed cellulitis but no abscess. There is dermoid mass. Will admit for abdominal wall cellulitis, hypoglycemia (requiring multiple rechecks and D50).    Final Clinical Impression(s) / ED Diagnoses Final diagnoses:  None    Rx / DC Orders ED Discharge Orders     None        Drenda Freeze, MD 03/21/21 1845

## 2021-03-21 NOTE — ED Triage Notes (Signed)
Pt arrived via EMS, from home, slip out of bed onto bottom due to muscle spasms in left leg.    48 CBG , given oral 15 G glucose

## 2021-03-21 NOTE — H&P (Signed)
History and Physical    Makayla [name] KNL:[id number] DOB: 1969-05-28 DOA: 03/21/2021  PCP: Vonna Drafts, FNP  Patient coming from: Home via EMS  I have personally briefly reviewed patient's old medical records in [place]  Chief Complaint: Hypoglycemia  HPI: Makayla [name] is a 55 y.o. female with medical history significant for insulin-dependent type 2 diabetes, CKD stage IIIb, HTN, HLD, and morbid obesity who presented to the ED for evaluation of hypoglycemia.  Patient states that she has been having spasms in her left upper thigh left lower abdomen area.  She states that she has had low appetite recently and has not eaten since yesterday.  She slipped out of bed onto her bottom which she attributes to her leg spasms.  EMS were called to her home.  CBG check was 48 and she was given oral glucose 15 g per ED triage documentation.  Patient also has increased erythema beneath her abdominal pannus, more so on the left side.  This is in the area where she says her spasms have been occurring.  She has not been able to visualize this area due to her body habitus.  ED Course:  Initial vitals showed BP 150/79, pulse 110, RR 16, temp 97.8 F, SPO2 100% on room air.  Labs show WBC 5.4, hemoglobin 15.1, platelets 212,000, sodium 135, potassium 3.6, bicarb 25, BUN 14, creatinine 1.92, serum glucose 93, AST 57, ALT 23, alk phos 85, total bilirubin 0.6, CK 1060.  SARS-CoV-2 and influenza PCR negative.  Blood cultures collected and in process.  Lactic acid 1.2.  2 view chest x-ray negative for focal consolidation, edema, effusion.  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  Subcutaneous fat stranding and dermal thickening within the inferior abdominal wall pannus noted without fluid collection or abscess.  Cholelithiasis without cholecystitis seen.  Fibroid uterus and severe bilateral hip osteoarthritis left greater than  right noted without acute fractures.  Patient was given 1 L normal saline, 1 amp D50, IV vancomycin and cefepime, IV morphine 4 mg.  The hospitalist service was consulted to admit for further evaluation and management.  Review of Systems: All systems reviewed and are negative except as documented in history of present illness above.   Past Medical History:  Diagnosis Date   Cellulitis    Chronic bronchitis (HCC)    CKD (chronic kidney disease), stage III ([place])    Diabetes mellitus    Hyperlipidemia    Hypertension    Neuropathy     Past Surgical History:  Procedure Laterality Date   TONSILLECTOMY      Social History:  reports that she has been smoking cigarettes. She has a 2.30 pack-year smoking history. She has never used smokeless tobacco. She reports that she does not currently use alcohol. She reports that she does not use drugs.  Allergies  Allergen Reactions   Ibuprofen Other (See Comments)    Can not take due to kidney problems.    Peach Flavor Swelling    Peaches.    Penicillins     Inflates bronchitis.  Has patient had a PCN reaction causing immediate rash, facial/tongue/throat swelling, SOB or lightheadedness with hypotension: Yes- shortness of breathe.  Has patient had a PCN reaction causing severe rash involving mucus membranes or skin necrosis: No Has patient had a PCN reaction that required hospitalization No Has patient had a PCN reaction occurring within the last 10 years: No If all of the above  answers are "NO", then may proceed with Cephalosporin use.    Strawberry Extract Swelling    Family History  Problem Relation Age of Onset   Diabetes Mother    Hypertension Mother    Hyperlipidemia Mother    Diabetes Father    Heart disease Father    Diabetes Sister      Prior to Admission medications   Medication Sig Start Date End Date Taking? Authorizing Provider  acetaminophen (TYLENOL) 500 MG tablet Take 2 tablets (1,000 mg total) by mouth every 6  (six) hours as needed for mild pain or moderate pain. 11/17/17   Maczis, Barth Kirks, PA-C  albuterol (PROVENTIL HFA;VENTOLIN HFA) 108 (90 BASE) MCG/ACT inhaler Inhale 2 puffs into the lungs every 6 (six) hours as needed. For shortness of breath    [provider]  amLODipine-benazepril (LOTREL) 5-10 MG capsule Take 1 capsule by mouth daily. 10/29/17   [provider]  B-D UF III MINI PEN NEEDLES 31G X 5 MM MISC See admin instructions. 04/28/15   [provider]  cyclobenzaprine (FLEXERIL) 5 MG tablet Take 1 tablet twice a day as needed for Muscle spasms. 01/31/15   Elayne Snare, MD  diclofenac sodium (VOLTAREN) 1 % GEL Apply 2 g topically 4 (four) times daily. 01/30/16   Leo Grosser, MD  gabapentin (NEURONTIN) 400 MG capsule Take 400 mg by mouth 3 (three) times daily.    [provider]  glucose blood (ONETOUCH VERIO) test strip Use as instructed to check blood sugar 2 times daily Dx code E11.9 11/03/14   Elayne Snare, MD  HYDROcodone-acetaminophen (NORCO) 5-325 MG tablet Take 2 tablets by mouth every 4 (four) hours as needed. 10/22/20   Daleen Bo, MD  Insulin Syringe-Needle U-100 (SAFETY-GLIDE 0.3CC SYR 29GX1/2) 29G X 1/2" 0.3 ML MISC Use as directed. 12/11/12   Hongalgi, Lenis Dickinson, MD  nystatin cream (MYCOSTATIN) Apply to affected area 2 times daily 01/17/15   [place], Emily, PA-C  rosuvastatin (CRESTOR) 40 MG tablet Take 40 mg by mouth daily.    [provider]  SYMBICORT 160-4.5 MCG/ACT inhaler Inhale 2 puffs into the lungs 2 (two) times daily. 10/02/17   [provider]  TRESIBA FLEXTOUCH 200 UNIT/ML SOPN Inject 60 Units into the skin at bedtime. Patient taking differently: Inject 26 Units into the skin at bedtime. 01/17/15   Clayton Bibles, PA-C    Physical Exam: Vitals:   03/21/21 1622 03/21/21 1642 03/21/21 1700 03/21/21 1830  BP: (!) 147/92 (!) 153/96 (!) 167/104 (!) 138/100  Pulse: (!) 107 (!) 105 (!) 103 81  Resp: 18 18 (!) 24 (!) 24  Temp:       TempSrc:      SpO2: 98% 98% 100% 99%  Weight:  (!) 140.6 kg    Height:  _0  (1.626 m)     Constitutional: Morbidly obese woman resting in bed with head elevated, NAD, calm, comfortable Eyes: PERRL, lids and conjunctivae normal ENMT: Mucous membranes are moist. Posterior pharynx clear of any exudate or lesions.Normal dentition.  Voice is hoarse. Neck: normal, supple, no masses. Respiratory: clear to auscultation bilaterally, no wheezing, no crackles. Normal respiratory effort. No accessory muscle use.  Cardiovascular: Regular rate and rhythm, no murmurs / rubs / gallops. No extremity edema. 2+ pedal pulses. Abdomen: no tenderness, no masses palpated. No hepatosplenomegaly. Bowel sounds positive.  Musculoskeletal: no clubbing / cyanosis. No joint deformity upper and lower extremities. Good ROM, no contractures. Normal muscle tone.  Skin: Increased erythema and warmth  under abdominal pannus consistent with cellulitis Neurologic: CN 2-12 grossly intact. Sensation intact. Strength 5/5 in all 4.  Psychiatric: Alert and oriented x 3. Normal mood.   Labs on Admission: I have personally reviewed following labs and imaging studies  CBC: Recent Labs  Lab 03/21/21 1456  WBC 5.4  NEUTROABS 3.9  HGB 15.1*  HCT 47.9*  MCV 87.9  PLT 295   Basic Metabolic Panel: Recent Labs  Lab 03/21/21 1456  NA 135  K 3.6  CL 100  CO2 25  GLUCOSE 93  BUN 14  CREATININE 1.92*  CALCIUM 9.1   GFR: Estimated Creatinine Clearance: 47.7 mL/min (A) (by C-G formula based on SCr of 1.92 mg/dL (H)). Liver Function Tests: Recent Labs  Lab 03/21/21 1456  AST 57*  ALT 23  ALKPHOS 85  BILITOT 0.6  PROT 8.5*  ALBUMIN 3.5   No results for input(s): LIPASE, AMYLASE in the last 168 hours. No results for input(s): AMMONIA in the last 168 hours. Coagulation Profile: No results for input(s): INR, PROTIME in the last 168 hours. Cardiac Enzymes: Recent Labs  Lab 03/21/21 1644  CKTOTAL 1,060*   BNP  (last 3 results) No results for input(s): PROBNP in the last 8760 hours. HbA1C: No results for input(s): HGBA1C in the last 72 hours. CBG: Recent Labs  Lab 03/21/21 1446 03/21/21 1616 03/21/21 1737  GLUCAP 41* 65* 85   Lipid Profile: No results for input(s): CHOL, HDL, LDLCALC, TRIG, CHOLHDL, LDLDIRECT in the last 72 hours. Thyroid Function Tests: No results for input(s): TSH, T4TOTAL, FREET4, T3FREE, THYROIDAB in the last 72 hours. Anemia Panel: No results for input(s): VITAMINB12, FOLATE, FERRITIN, TIBC, IRON, RETICCTPCT in the last 72 hours. Urine analysis:    Component Value Date/Time   COLORURINE YELLOW 12/24/2019 1846   APPEARANCEUR CLEAR 12/24/2019 1846   LABSPEC 1.016 12/24/2019 1846   PHURINE 5.0 12/24/2019 1846   GLUCOSEU >=500 (A) 12/24/2019 1846   HGBUR NEGATIVE 12/24/2019 1846   BILIRUBINUR NEGATIVE 12/24/2019 1846   BILIRUBINUR Neg 07/28/2014 1307   KETONESUR NEGATIVE 12/24/2019 1846   PROTEINUR NEGATIVE 12/24/2019 1846   UROBILINOGEN negative 07/28/2014 1307   UROBILINOGEN 0.2 12/08/2012 1252   NITRITE NEGATIVE 12/24/2019 1846   LEUKOCYTESUR NEGATIVE 12/24/2019 1846    Radiological Exams on Admission: CT ABDOMEN PELVIS WO CONTRAST  Result Date: 03/21/2021 CLINICAL DATA:  Sepsis, abdominal pain EXAM: CT ABDOMEN AND PELVIS WITHOUT CONTRAST TECHNIQUE: Multidetector CT imaging of the abdomen and pelvis was performed following the standard protocol without IV contrast. Unenhanced CT was performed per clinician order. Lack of IV contrast limits sensitivity and specificity, especially for evaluation of abdominal/pelvic solid viscera. COMPARISON:  None. FINDINGS: Lower chest: No acute pleural or parenchymal lung disease. Hepatobiliary: Calcified gallstones without cholecystitis. Unremarkable unenhanced appearance of the liver. Pancreas: Unremarkable unenhanced appearance. Spleen: Unremarkable unenhanced appearance. Adrenals/Urinary Tract: No urinary tract calculi or  obstructive uropathy. The adrenals are unremarkable. There is moderate distention of the bladder without filling defect. Stomach/Bowel: No bowel obstruction or ileus. Normal appendix right lower quadrant. No bowel wall thickening or inflammatory change. Vascular/Lymphatic: No significant vascular findings are present. No enlarged abdominal or pelvic lymph nodes. Reproductive: Intrauterine calcifications most consistent with degenerating fibroids. There is a large left ovarian mass containing macroscopic fat and calcification, measuring 11.0 x 12.4 x 9.3 cm consistent with ovarian dermoid. No right adnexal mass. Other: No free fluid or free gas.  No abdominal wall hernia. Musculoskeletal: There are no acute bony abnormalities. There is severe bilateral  hip osteoarthritis, left greater than right. Imaging was performed through the perineum, with no inflammatory changes in the perineum or labia to suggest infection. However, there is subcutaneous fat stranding and dermal thickening within the inferior margin of the abdominal pannus, not completely imaged on this study, which could reflect abdominal wall cellulitis. No fluid collection or evidence of abscess. Reconstructed images demonstrate no additional findings. IMPRESSION: 1. 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 Maximal dimension 12.4 cm. 2. Subcutaneous fat stranding and dermal thickening within the inferior margin of the abdominal wall pannus, which could reflect cellulitis. No fluid collection or abscess. 3. Cholelithiasis without cholecystitis. 4. Fibroid uterus. 5. Severe bilateral hip osteoarthritis, left greater than right. No acute fractures. Electronically Signed   By: Randa Ngo M.D.   On: 03/21/2021 17:28   DG Chest 2 View  Result Date: 03/21/2021 CLINICAL DATA:  Short of breath, fell yesterday, back pain EXAM: CHEST - 2 VIEW COMPARISON:  12/08/2012 FINDINGS: Frontal and lateral views of the chest demonstrate an unremarkable cardiac  silhouette. No acute airspace disease, effusion, or pneumothorax. There are no acute bony abnormalities. IMPRESSION: 1. No acute intrathoracic process. Electronically Signed   By: Randa Ngo M.D.   On: 03/21/2021 15:17    EKG: Ordered and pending.  Assessment/Plan Principal Problem:   Cellulitis of abdominal wall Active Problems:   Hyperlipidemia associated with type 2 diabetes mellitus ([place])   Hypertension associated with diabetes ([place])   Type 2 diabetes mellitus with hypoglycemia without coma ([place])   Stage 3b chronic kidney disease (CKD) ([place])   Elevated CK   Makayla [name] is a 55 y.o. female with medical history significant for insulin-dependent type 2 diabetes, CKD stage IIIb, HTN, HLD, and morbid obesity who is admitted with abdominal wall cellulitis.  Cellulitis of abdominal wall: Cellulitic changes present beneath abdominal pannus.  Continue IV Ancef and follow blood cultures.  Hypoglycemia in setting of insulin-dependent type 2 diabetes: Likely secondary to insulin and glipizide use in setting of decreased oral intake. -Hold insulin/glipizide, monitor CBG q4h and prn with hypoglycemia protocol -Check A1c  Elevated CK: CK 1060 on admission.  No clear evidence of rhabdomyolysis.  Renal function near baseline. -Continue IV fluid hydration overnight -Hold statin  CKD stage IIIb: Stable, continue to monitor.  Hypertension: Continue amlodipine-benazepril.  Chronic bronchitis: Continue Symbicort and albuterol as needed.  Hyperlipidemia: Holding statin with elevated CK.  Left ovarian mass: Large left ovarian mass seen on CT A/P 03/21/2021 with maximal dimension 12.4 cm.  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  DVT prophylaxis: Lovenox Code Status: DNR, confirmed with patient on admission Family Communication: Discussed with patient, she has discussed with family Disposition Plan: From home and likely discharge to home pending clinical  progress Consults called: None Level of care: Med-Surg Admission status:  Status is: Observation  The patient remains OBS appropriate and will d/c before 2 midnights.   Zada Finders MD [facility]  If 7PM-7AM, please contact night-coverage [email or web address]  03/21/2021, 7:16 PM

## 2021-03-21 NOTE — ED Notes (Signed)
Patient transported to CT 

## 2021-03-21 NOTE — ED Provider Notes (Signed)
Emergency Medicine Provider Triage Evaluation Note  Makayla [name] , a 55 y.o. female  was evaluated in triage.  Pt complains of leg spasms, fall from bed last night. Patient endorses leg spasms for seemingly no reason, with some pain. Patient denies back pain. Patient is diabetic, reports she has not eaten since yesterday, has not taken insulin today. Arrival CBG was 48 -- given 15 g oral glucose, is eating, drinking juice during interview. Endorses some SOB, "bronchitis" symptoms. 1PPD tobacco use prior to 2 months ago.  Review of Systems  Positive: Leg spasms, hypoglycemia, SOB Negative: Chest pain, back pain, numbness, tingling  Physical Exam  BP (!) 150/79 (BP Location: Left Arm)    Pulse (!) 110    Temp 97.8 F (36.6 C) (Oral)    Resp 16    LMP 12/08/2012    SpO2 100%  Gen:   Awake, no distress   Resp:  Normal effort  MSK:   Moves extremities without difficulty, no fasciculations noted Other:  Intact dp, pt pulses  Medical Decision Making  Medically screening exam initiated at 2:56 PM.  Appropriate orders placed.  Makayla [name] was informed that the remainder of the evaluation will be completed by another provider, this initial triage assessment does not replace that evaluation, and the importance of remaining in the ED until their evaluation is complete.  Multiple issues, given sugar and food for hypoglycemia in triage   Dorien Chihuahua 03/21/21 1458    Milton Ferguson, MD 03/21/21 1606

## 2021-03-21 NOTE — ED Notes (Signed)
Meal given to pt at this time.

## 2021-03-21 NOTE — ED Notes (Signed)
Patient returned back from CT at this time.  °

## 2021-03-21 NOTE — Progress Notes (Signed)
A consult was received from an ED physician for cefepime per pharmacy dosing.  The patient's profile has been reviewed for ht/wt/allergies/indication/available labs.    A one time order has been placed for cefepime 2gm IV x1.  Further antibiotics/pharmacy consults should be ordered by admitting physician if indicated.                       Thank you, Lynelle Doctor 03/21/2021  4:23 PM

## 2021-03-22 DIAGNOSIS — N179 Acute kidney failure, unspecified: Secondary | ICD-10-CM | POA: Diagnosis present

## 2021-03-22 DIAGNOSIS — F17210 Nicotine dependence, cigarettes, uncomplicated: Secondary | ICD-10-CM | POA: Diagnosis present

## 2021-03-22 DIAGNOSIS — Z20822 Contact with and (suspected) exposure to covid-19: Secondary | ICD-10-CM | POA: Diagnosis present

## 2021-03-22 DIAGNOSIS — E1122 Type 2 diabetes mellitus with diabetic chronic kidney disease: Secondary | ICD-10-CM | POA: Diagnosis present

## 2021-03-22 DIAGNOSIS — Z66 Do not resuscitate: Secondary | ICD-10-CM | POA: Diagnosis present

## 2021-03-22 DIAGNOSIS — E785 Hyperlipidemia, unspecified: Secondary | ICD-10-CM | POA: Diagnosis present

## 2021-03-22 DIAGNOSIS — I129 Hypertensive chronic kidney disease with stage 1 through stage 4 chronic kidney disease, or unspecified chronic kidney disease: Secondary | ICD-10-CM | POA: Diagnosis present

## 2021-03-22 DIAGNOSIS — N838 Other noninflammatory disorders of ovary, fallopian tube and broad ligament: Secondary | ICD-10-CM | POA: Diagnosis present

## 2021-03-22 DIAGNOSIS — L0390 Cellulitis, unspecified: Secondary | ICD-10-CM | POA: Diagnosis present

## 2021-03-22 DIAGNOSIS — Z6841 Body Mass Index (BMI) 40.0 and over, adult: Secondary | ICD-10-CM | POA: Diagnosis not present

## 2021-03-22 DIAGNOSIS — L03311 Cellulitis of abdominal wall: Secondary | ICD-10-CM | POA: Diagnosis present

## 2021-03-22 DIAGNOSIS — J42 Unspecified chronic bronchitis: Secondary | ICD-10-CM | POA: Diagnosis present

## 2021-03-22 DIAGNOSIS — Z794 Long term (current) use of insulin: Secondary | ICD-10-CM | POA: Diagnosis not present

## 2021-03-22 DIAGNOSIS — Z8249 Family history of ischemic heart disease and other diseases of the circulatory system: Secondary | ICD-10-CM | POA: Diagnosis not present

## 2021-03-22 DIAGNOSIS — Z833 Family history of diabetes mellitus: Secondary | ICD-10-CM | POA: Diagnosis not present

## 2021-03-22 DIAGNOSIS — E11649 Type 2 diabetes mellitus with hypoglycemia without coma: Secondary | ICD-10-CM | POA: Diagnosis present

## 2021-03-22 DIAGNOSIS — N1832 Chronic kidney disease, stage 3b: Secondary | ICD-10-CM | POA: Diagnosis present

## 2021-03-22 DIAGNOSIS — E1169 Type 2 diabetes mellitus with other specified complication: Secondary | ICD-10-CM | POA: Diagnosis present

## 2021-03-22 LAB — BASIC METABOLIC PANEL
Anion gap: 10 (ref 5–15)
BUN: 15 mg/dL (ref 6–20)
CO2: 21 mmol/L — ABNORMAL LOW (ref 22–32)
Calcium: 8.8 mg/dL — ABNORMAL LOW (ref 8.9–10.3)
Chloride: 103 mmol/L (ref 98–111)
Creatinine, Ser: 1.85 mg/dL — ABNORMAL HIGH (ref 0.44–1.00)
GFR, Estimated: 32 mL/min — ABNORMAL LOW (ref >60)
Glucose, Bld: 82 mg/dL (ref 70–99)
Potassium: 3.7 mmol/L (ref 3.5–5.1)
Sodium: 134 mmol/L — ABNORMAL LOW (ref 135–145)

## 2021-03-22 LAB — CBC
HCT: 42.6 % (ref 36.0–46.0)
Hemoglobin: 13.1 g/dL (ref 12.0–15.0)
MCH: 27.5 pg (ref 26.0–34.0)
MCHC: 30.8 g/dL (ref 30.0–36.0)
MCV: 89.5 fL (ref 80.0–100.0)
Platelets: 180 10*3/uL (ref 150–400)
RBC: 4.76 MIL/uL (ref 3.87–5.11)
RDW: 14.7 % (ref 11.5–15.5)
WBC: 7 10*3/uL (ref 4.0–10.5)
nRBC: 0 % (ref 0.0–0.2)

## 2021-03-22 LAB — GLUCOSE, CAPILLARY
Glucose-Capillary: 83 mg/dL (ref 70–99)
Glucose-Capillary: 81 mg/dL (ref 70–99)
Glucose-Capillary: 85 mg/dL (ref 70–99)
Glucose-Capillary: 89 mg/dL (ref 70–99)

## 2021-03-22 LAB — CK: Total CK: 1029 U/L — ABNORMAL HIGH (ref 38–234)

## 2021-03-22 LAB — HIV ANTIBODY (ROUTINE TESTING W REFLEX): HIV Screen 4th Generation wRfx: NONREACTIVE

## 2021-03-22 LAB — HEMOGLOBIN A1C
Hgb A1c MFr Bld: 6 % — ABNORMAL HIGH (ref 4.8–5.6)
Mean Plasma Glucose: 125.5 mg/dL

## 2021-03-22 MED ORDER — HYDROCODONE-ACETAMINOPHEN 5-325 MG PO TABS
2.0000 | ORAL_TABLET | ORAL | Status: DC | PRN
  Administered 2021-03-22 – 2021-03-24 (×2): 2 via ORAL
  Filled 2021-03-22 (×2): qty 2

## 2021-03-22 MED ORDER — SODIUM CHLORIDE 0.9 % IV SOLN: INTRAVENOUS | Status: AC

## 2021-03-22 NOTE — Assessment & Plan Note (Signed)
Continue amlodipine/benazepril.

## 2021-03-22 NOTE — Progress Notes (Signed)
Transition of Care [facility]) Screening Note  Patient Details  Name: Makayla [name] Date of Birth: 10-26-1965  Transition of Care [facility]) CM/SW Contact:    Sherie Don, LCSW Phone Number: 03/22/2021, 10:58 AM  Transition of Care Department [facility]) has reviewed patient and no TOC needs have been identified at this time. We will continue to monitor patient advancement through interdisciplinary progression rounds. If new patient transition needs arise, please place a TOC consult.

## 2021-03-22 NOTE — Assessment & Plan Note (Signed)
Large left ovarian mass seen on CT A/P 03/21/2021 with maximal dimension 12.4 cm.  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

## 2021-03-22 NOTE — Assessment & Plan Note (Signed)
Stable, continue to monitor  ?

## 2021-03-22 NOTE — Assessment & Plan Note (Signed)
CK 1060 on admission.  No clear evidence of rhabdomyolysis.  Renal function near baseline. -Continue IV fluid hydration overnight -Hold statin

## 2021-03-22 NOTE — Assessment & Plan Note (Signed)
Cellulitic changes present beneath abdominal pannus.  Continue IV Ancef and follow blood cultures.

## 2021-03-22 NOTE — Progress Notes (Signed)
Inpatient Diabetes Program Recommendations  AACE/ADA: New Consensus Statement on Inpatient Glycemic Control (2015)  Target Ranges:  Prepandial:   less than 140 mg/dL      Peak postprandial:   less than 180 mg/dL (1-2 hours)      Critically ill patients:  140 - 180 mg/dL   Lab Results  Component Value Date   GLUCAP 81 03/22/2021   HGBA1C 6.0 (H) 03/22/2021    Review of Glycemic Control  Latest Reference Range & Units 03/21/21 14:46 03/21/21 16:16 03/21/21 17:37 03/21/21 20:44 03/21/21 23:50 03/22/21 04:02 03/22/21 08:11  Glucose-Capillary 70 - 99 mg/dL 41 (LL) 65 (L) 85 70 120 (H) 83 81   Diabetes history: DM 2 Outpatient Diabetes medications: Tresiba 56 units qhs (half life 42 hours), Victoza 0.6 mg bid, Glipizide 5 mg Daily Current orders for Inpatient glycemic control:  None CBG checks due to hypoglycemia  A1c 6% on 12/14  Note pt reported 200 pound wt loss over the past few weeks  Wt: 151.6 Kg at appt on 05/30/20 104.6 Kg on 03/21/2021  Pts weight loss has been 24.21 lbs since February  Has had 4 visit at [facility] for diabetes Lifestyle education in fall of 2020  Spoke with pt at bedside. Pt reports not checking her glucose at home and doing what she is suppose to be doing. Discussed with pt to check her fasting glucose and a second check later in the day either before a meal or at bedtime. Explained our bodies are never the same and continually monitoring is how she knows if she needs medication adjustment. Pt reports weight loss recently. Her last MD visit was around a month ago, she is not sure of medication changes. PT reports being compliant with diet and that is how she has lost weight. Explained Tyler Aas has a long half life (42 hours) and we will have to watch her glucose trends in order to see how much insulin her body requires at this time.  Inpatient Diabetes Program Recommendations:    Monitor on current regimen.  D/C: -   Would continue victoza at home would further  help with wt loss. Monitor trends to determine if Tresiba dose needs to decrease an or glipizide d/c'd  Thanks,  Tama Headings RN, MSN, BC-ADM Inpatient Diabetes Coordinator Team Pager [phone] (8a-5p)

## 2021-03-22 NOTE — Assessment & Plan Note (Addendum)
Likely secondary to insulin and glipizide use in setting of decreased oral intake. -patient also does not check her blood sugar like she is supposed to do -Hold insulin/glipizide, monitor CBG q4h and prn with hypoglycemia protocol - A1c: 6 -Makayla [name] has a long half life (42 hours) and we will have to watch her glucose trends in order to see how much insulin her body requires

## 2021-03-22 NOTE — Evaluation (Signed)
Physical Therapy Evaluation Patient Details Name: Makayla [name] MRN: [id number] DOB: [birth year] Today's Date: 03/22/2021  History of Present Illness  Patient is 55 y.o. female presenting to ED for hypoglycemia and LE spasms and pain in abdominal area. PMH significant for DMII, CKD III, HTN, HLD, obesity, neuropathy, cellulitis.    Clinical Impression  Makayla [name] is 54 y.o. female admitted with above HPI and diagnosis. Patient is currently limited by functional impairments below (see PT problem list). Patient lives with a roommate and is has help from family for driving, groceries, and is independent with SPC at baseline. Patient currently required min guard with bari walker for mobility to ambulate short distance in room. Patient will benefit from continued skilled PT interventions to address impairments and progress independence with mobility, recommending HHPT vs no follow up pending progress with therapy. Acute PT will follow and progress as able.        Recommendations for follow up therapy are one component of a multi-disciplinary discharge planning process, led by the attending physician.  Recommendations may be updated based on patient status, additional functional criteria and insurance authorization.  Follow Up Recommendations Home health PT (HHPT vs no follow up pending progress)    Assistance Recommended at Discharge Intermittent Supervision/Assistance  Functional Status Assessment Patient has had a recent decline in their functional status and demonstrates the ability to make significant improvements in function in a reasonable and predictable amount of time.  Equipment Recommendations  None recommended by PT    Recommendations for Other Services       Precautions / Restrictions Precautions Precautions: Fall Restrictions Weight Bearing Restrictions: No Other Position/Activity Restrictions: WBAT      Mobility  Bed Mobility               General bed mobility  comments: pt sitting EOB at start    Transfers Overall transfer level: Needs assistance Equipment used: Rolling walker (2 wheels) Transfers: Sit to/from Stand Sit to Stand: Min guard           General transfer comment: pt using some momentum and bil UE use to power up from EOB. pt took small steps to sit on bench by window. Min guard to rise from bench.    Ambulation/Gait Ambulation/Gait assistance: Min guard;Supervision Gait Distance (Feet): 15 Feet Assistive device: Rolling walker (2 wheels) Gait Pattern/deviations: Step-through pattern;Decreased step length - right;Decreased step length - left;Decreased stride length;Wide base of support;Shuffle;Antalgic (WBOS for habitus) Gait velocity: decr     General Gait Details: pt overall steady with wide bas to accomodate habitus and mild antalgia noted with reduce stance time on Rt LE. pt has fallen and landed on knees previously at home.  Stairs            Wheelchair Mobility    Modified Rankin (Stroke Patients Only)       Balance Overall balance assessment: Mild deficits observed, not formally tested                                           Pertinent Vitals/Pain Pain Assessment: No/denies pain    Home Living Family/patient expects to be discharged to:: Private residence Living Arrangements: Other (Comment) (room mate) Available Help at Discharge: Family;Friend(s) Type of Home: Apartment Home Access: Stairs to enter Entrance Stairs-Rails: None Entrance Stairs-Number of Steps: 2   Home Layout: One level Home Equipment: Conservation officer, nature (2  wheels);Cane - single point Additional Comments: pt is planning to move in with her mother    Prior Function Prior Level of Function : Independent/Modified Independent             Mobility Comments: family provides transportation and does grocery shopping for pt ADLs Comments: reports independent to get in tub and shower     Hand Dominance    Dominant Hand: Right    Extremity/Trunk Assessment   Upper Extremity Assessment Upper Extremity Assessment: Overall WFL for tasks assessed    Lower Extremity Assessment Lower Extremity Assessment: Generalized weakness    Cervical / Trunk Assessment Cervical / Trunk Assessment: Other exceptions Cervical / Trunk Exceptions: body habitus  Communication   Communication: No difficulties (SOB and unable)  Cognition Arousal/Alertness: Awake/alert Behavior During Therapy: WFL for tasks assessed/performed Overall Cognitive Status: Within Functional Limits for tasks assessed                                          General Comments      Exercises     Assessment/Plan    PT Assessment Patient needs continued PT services  PT Problem List Decreased strength;Decreased activity tolerance;Decreased balance;Decreased mobility;Decreased knowledge of use of DME;Obesity       PT Treatment Interventions DME instruction;Gait training;Stair training;Functional mobility training;Therapeutic activities;Therapeutic exercise;Balance training;Patient/family education    PT Goals (Current goals can be found in the Care Plan section)  Acute Rehab PT Goals Patient Stated Goal: get recovered and home PT Goal Formulation: With patient Time For Goal Achievement: 04/05/21 Potential to Achieve Goals: Good    Frequency Min 3X/week   Barriers to discharge        Co-evaluation               AM-PAC PT "6 Clicks" Mobility  Outcome Measure Help needed turning from your back to your side while in a flat bed without using bedrails?: None Help needed moving from lying on your back to sitting on the side of a flat bed without using bedrails?: None Help needed moving to and from a bed to a chair (including a wheelchair)?: A Little Help needed standing up from a chair using your arms (e.g., wheelchair or bedside chair)?: A Little Help needed to walk in hospital room?: A Little Help  needed climbing 3-5 steps with a railing? : A Lot 6 Click Score: 19    End of Session Equipment Utilized During Treatment: Gait belt Activity Tolerance: Patient tolerated treatment well Patient left: in chair;with call bell/phone within reach;with chair alarm set;with family/visitor present Nurse Communication: Mobility status PT Visit Diagnosis: Muscle weakness (generalized) (M62.81);Difficulty in walking, not elsewhere classified (R26.2)    Time: 4081-4481 PT Time Calculation (min) (ACUTE ONLY): 26 min   Charges:   PT Evaluation $PT Eval Low Complexity: 1 Low PT Treatments $Gait Training: 8-22 mins        Verner Mould, DPT Acute Rehabilitation Services Office [phone] Pager [phone]    Jacques Navy 03/22/2021, 12:11 PM

## 2021-03-22 NOTE — Progress Notes (Signed)
°  Progress Note    Makayla [name]   TTS:[id number]  DOB: 07/06/1965  DOA: 03/21/2021     0 Date of Service: 03/22/2021   Clinical Course Makayla [name] is a 55 y.o. female with medical history significant for insulin-dependent type 2 diabetes, CKD stage IIIb, HTN, HLD, and morbid obesity who is admitted with abdominal wall cellulitis and hypoglycemia.   Assessment and Plan * Cellulitis of abdominal wall Cellulitic changes present beneath abdominal pannus.  Continue IV Ancef and follow blood cultures.  Type 2 diabetes mellitus with hypoglycemia without coma ([place]) Likely secondary to insulin and glipizide use in setting of decreased oral intake. -patient also does not check her blood sugar like she is supposed to do -Hold insulin/glipizide, monitor CBG q4h and prn with hypoglycemia protocol - A1c: 6 -Tyler Aas has a long half life (42 hours) and we will have to watch her glucose trends in order to see how much insulin her body requires    Elevated CK CK 1060 on admission.  No clear evidence of rhabdomyolysis.  Renal function near baseline. -Continue IV fluid hydration overnight -Hold statin  Stage 3b chronic kidney disease (CKD) (HCC) Stable, continue to monitor  Obesity, morbid ([place]) Estimated body mass index is 53.21 kg/m as calculated from the following:   Height as of this encounter: 5\' 4"  (1.626 m).   Weight as of this encounter: 140.6 kg.  Hypertension associated with diabetes ([place]) Continue amlodipine-benazepril.  Ovarian mass, left Large left ovarian mass seen on CT A/P 03/21/2021 with maximal dimension 12.4 cm.  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    Subjective:  No SOB more than normal  Objective Vitals:   03/21/21 2053 03/22/21 0108 03/22/21 0809 03/22/21 1025  BP: 137/90 (!) 147/93  133/81  Pulse: (!) 101 (!) 104  (!) 110  Resp: 18 17  20   Temp: 97.8 F (36.6 C) 98.1 F (36.7 C)  98.5 F (36.9 C)  TempSrc: Oral Oral  Oral  SpO2:  100% 98% 99% 98%  Weight:      Height:       (!) 140.6 kg     Exam  General: Appearance:    Severely obese female in no acute distress   Large abdominal panus  Lungs:     respirations unlabored  Heart:    Tachycardic.   MS:   All extremities are intact.    Neurologic:   Awake, alert, oriented x 3. No apparent focal neurological           defect.      Labs / Other Information Cr: 1.85-- down from 1.9 CK still elevated > 1000   Disposition Plan: Status is: Observation  The patient will require care spanning > 2 midnights and should be moved to inpatient because: need to monitor BS for at least 48 hours      Eulogio Bear [facility] 03/22/2021, 1:59 PM

## 2021-03-22 NOTE — Assessment & Plan Note (Signed)
Estimated body mass index is 53.21 kg/m as calculated from the following:   Height as of this encounter: 5\' 4"  (1.626 m).   Weight as of this encounter: 140.6 kg.

## 2021-03-22 NOTE — Plan of Care (Signed)
  Problem: Coping: Goal: Level of anxiety will decrease Outcome: Progressing   Problem: Elimination: Goal: Will not experience complications related to bowel motility Outcome: Progressing   Problem: Pain Managment: Goal: General experience of comfort will improve Outcome: Progressing   

## 2021-03-23 LAB — CBC
HCT: 38.3 % (ref 36.0–46.0)
Hemoglobin: 12.1 g/dL (ref 12.0–15.0)
MCH: 27.9 pg (ref 26.0–34.0)
MCHC: 31.6 g/dL (ref 30.0–36.0)
MCV: 88.2 fL (ref 80.0–100.0)
Platelets: 158 10*3/uL (ref 150–400)
RBC: 4.34 MIL/uL (ref 3.87–5.11)
RDW: 14.8 % (ref 11.5–15.5)
WBC: 5.7 10*3/uL (ref 4.0–10.5)
nRBC: 0 % (ref 0.0–0.2)

## 2021-03-23 LAB — GLUCOSE, CAPILLARY
Glucose-Capillary: 126 mg/dL — ABNORMAL HIGH (ref 70–99)
Glucose-Capillary: 132 mg/dL — ABNORMAL HIGH (ref 70–99)
Glucose-Capillary: 193 mg/dL — ABNORMAL HIGH (ref 70–99)
Glucose-Capillary: 250 mg/dL — ABNORMAL HIGH (ref 70–99)
Glucose-Capillary: 98 mg/dL (ref 70–99)

## 2021-03-23 LAB — BASIC METABOLIC PANEL
Anion gap: 10 (ref 5–15)
BUN: 24 mg/dL — ABNORMAL HIGH (ref 6–20)
CO2: 23 mmol/L (ref 22–32)
Calcium: 8.4 mg/dL — ABNORMAL LOW (ref 8.9–10.3)
Chloride: 100 mmol/L (ref 98–111)
Creatinine, Ser: 2.98 mg/dL — ABNORMAL HIGH (ref 0.44–1.00)
GFR, Estimated: 18 mL/min — ABNORMAL LOW (ref >60)
Glucose, Bld: 114 mg/dL — ABNORMAL HIGH (ref 70–99)
Potassium: 3.9 mmol/L (ref 3.5–5.1)
Sodium: 133 mmol/L — ABNORMAL LOW (ref 135–145)

## 2021-03-23 LAB — CK: Total CK: 736 U/L — ABNORMAL HIGH (ref 38–234)

## 2021-03-23 MED ORDER — PROSOURCE PLUS PO LIQD
30.0000 mL | Freq: Two times a day (BID) | ORAL | Status: DC
  Administered 2021-03-23 – 2021-03-24 (×3): 30 mL via ORAL
  Filled 2021-03-23 (×3): qty 30

## 2021-03-23 MED ORDER — ADULT MULTIVITAMIN W/MINERALS CH
1.0000 | ORAL_TABLET | Freq: Every day | ORAL | Status: DC
  Administered 2021-03-23 – 2021-03-24 (×2): 1 via ORAL
  Filled 2021-03-23 (×2): qty 1

## 2021-03-23 MED ORDER — SODIUM CHLORIDE 0.9 % IV SOLN: INTRAVENOUS | Status: AC

## 2021-03-23 NOTE — Consult Note (Signed)
WOC Nurse Consult Note: Reason for Consult: full thickness lesion in left subpannicular skin fold. Patient reports that it is chronic, that it heals "by itself" but "comes back" Wound type: infectious, pressure, moisture Pressure Injury POA: Yes Measurement: 2cm x 3.6cm with depth undetermined due to the presence of nonviable slough (fibrinous material) Wound bed: 100% white fibrinous material Drainage (amount, consistency, odor) small amount serous Periwound: intact with evidence of previous wound healing (scarring), no erythema, induration or warmth Dressing procedure/placement/frequency: I will ask Nursing to today begin daily wound care to this lesion using a soap and water cleanse followed by dressing with a xeroform gauze topped with dry dressings and secured with Medipore tape. This will promote autolytic debridement and wound healing.   [facility] nursing team will not follow, but will remain available to this patient, the nursing and medical teams.  Please re-consult if needed. Thanks, Maudie Flakes, MSN, RN, [place], Arther Abbott  Pager# [phone]

## 2021-03-23 NOTE — Progress Notes (Signed)
Initial Nutrition Assessment  DOCUMENTATION CODES:   Morbid obesity  INTERVENTION:   -Prosource Plus PO TID, each provides 100 kcals and 15g protein  -Multivitamin with minerals daily  NUTRITION DIAGNOSIS:   Increased nutrient needs related to wound healing as evidenced by estimated needs.  GOAL:   Patient will meet greater than or equal to 90% of their needs  MONITOR:   PO intake, Supplement acceptance, Labs, Weight trends, I & O's, Skin  REASON FOR ASSESSMENT:   Malnutrition Screening Tool    ASSESSMENT:   55 y.o. female with medical history significant for insulin-dependent type 2 diabetes, CKD stage IIIb, HTN, HLD, and morbid obesity who is admitted with abdominal wall cellulitis and hypoglycemia.  Patient currently consuming 100% of meals at this time. Noted allergies to strawberry and peach, thus limiting some supplement options for patient. Per chart review, pt was followed by outpatient RD for diabetes management. Will order Prosource supplements for additional protein to aid in healing of wound.   Per weight records, pt has lost 23 lbs since 2/21 (6% wt loss  x 10 months, insignificant for time frame).  Medications reviewed.  Labs reviewed:  CBGs: 81-126 Low Na  NUTRITION - FOCUSED PHYSICAL EXAM:  No depletions noted.  Diet Order:   Diet Order             Diet renal/carb modified with fluid restriction Diet-HS Snack? Nothing; Fluid restriction: 1200 mL Fluid; Room service appropriate? Yes; Fluid consistency: Thin  Diet effective now                   EDUCATION NEEDS:   No education needs have been identified at this time  Skin:  Skin Assessment: Skin Integrity Issues: Skin Integrity Issues:: Other (Comment) Other: non-pressure open wound of abdomen  Last BM:  PTA  Height:   Ht Readings from Last 1 Encounters:  03/21/21 5\' 4"  (1.626 m)    Weight:   Wt Readings from Last 1 Encounters:  03/21/21 (!) 140.6 kg    BMI:  Body mass  index is 53.21 kg/m.  Estimated Nutritional Needs:   Kcal:  1950-2150  Protein:  85-100g  Fluid:  1.2L/day  Clayton Bibles, MS, RD, LDN Inpatient Clinical Dietitian Contact information available via Amion

## 2021-03-23 NOTE — Progress Notes (Addendum)
Progress Note    Makayla [name]   [id number]:[id number]  DOB: [birth year]  DOA: 03/21/2021     1 Date of Service: 03/23/2021   Clinical Course Makayla [name] is a 55 y.o. female with medical history significant for insulin-dependent type 2 diabetes, CKD stage IIIb, HTN, HLD, and morbid obesity who is admitted with abdominal wall cellulitis and hypoglycemia.  03/23/21: Patient was seen and examined at her bedside.  Her abdominal wall cellulitis is improved.  Hypoglycemia is also improved.  Worsening renal function noted this morning.  Holding off her home benazepril.  Gentle IV fluid hydration today, will repeat BMP at 1700 to see if there is improvement in her renal function.  Anticipate DC on 03/23/21.   Assessment and Plan * Cellulitis of abdominal wall Cellulitic changes present beneath abdominal pannus.  Continue IV Ancef and follow blood cultures.  Ovarian mass, left Large left ovarian mass seen on CT A/P 03/21/2021 with maximal dimension 12.4 cm.  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  Elevated CK CK 1060 on admission.  No clear evidence of rhabdomyolysis.  Renal function near baseline. -Continue IV fluid hydration overnight -Hold statin  Stage 3b chronic kidney disease (CKD) (HCC) Stable, continue to monitor  Type 2 diabetes mellitus with hypoglycemia without coma ([place]) Likely secondary to insulin and glipizide use in setting of decreased oral intake. -patient also does not check her blood sugar like she is supposed to do -Hold insulin/glipizide, monitor CBG q4h and prn with hypoglycemia protocol - A1c: 6 -Tyler Aas has a long half life (42 hours) and we will have to watch her glucose trends in order to see how much insulin her body requires    Obesity, morbid ([place]) Estimated body mass index is 53.21 kg/m as calculated from the following:   Height as of this encounter: 5\' 4"  (1.626 m).   Weight as of this encounter: 140.6 kg.  Hypertension associated with  diabetes ([place]) Continue amlodipine-benazepril.  AKI on CKD 3B Uptrending of creatinine 2.98 with GFR of 27. Hold off on benazepril Start gentle IV fluid hydration normal saline at 50 cc/h x 1 day Repeat BMP at 1700. Closely monitor urine output Avoid nephrotoxic agents\  Ambulate dysfunction PT assessed and recommended home health PT Continue PT with assistance and fall precautions TOC consulted to assist with home health services arrangement  Prediabetes Hemoglobin A1c 6.0 on 03/22/2021 CBG stable  No SOB more than normal  Objective Vitals:   03/22/21 2015 03/23/21 0618 03/23/21 0744 03/23/21 0747  BP: (!) 114/58 98/74    Pulse: 92 92    Resp: 18 18    Temp: 97.9 F (36.6 C) 98.8 F (37.1 C)    TempSrc: Oral Oral    SpO2: 96% 98% 97% 97%  Weight:      Height:       (!) 140.6 kg     Exam  General: Appearance:  Severely obese in no acute distress.  She is alert and oriented x3.   Large abdominal panus  Lungs:   Clear to auscultation with no wheezes or rales.  Good inspiratory effort.  Heart:  Regular rate and rhythm no rubs or gallops.  No JVD or thyromegaly.  MS: No lower extremity edema bilaterally.   Neurologic: Alert and oriented x3.  Nonfocal exam. GI: Obese, nontender bowel sounds present. Skin: Cellulitic lesion appears much improved. Psych: Mood is appropriate for condition and setting.     Labs / Other Information Cr: 1.85-- down from 1.9 CK  still elevated > 1000   Disposition Plan: Status is: Inpatient status.  The patient will require care spanning > 2 midnights and should be moved to inpatient because: need to monitor BS for at least 48 hours      [facility] 03/23/2021, 10:09 AM

## 2021-03-24 ENCOUNTER — Other Ambulatory Visit (HOSPITAL_COMMUNITY): Payer: Self-pay

## 2021-03-24 LAB — BASIC METABOLIC PANEL
Anion gap: 9 (ref 5–15)
BUN: 32 mg/dL — ABNORMAL HIGH (ref 6–20)
CO2: 19 mmol/L — ABNORMAL LOW (ref 22–32)
Calcium: 8.7 mg/dL — ABNORMAL LOW (ref 8.9–10.3)
Chloride: 102 mmol/L (ref 98–111)
Creatinine, Ser: 2.78 mg/dL — ABNORMAL HIGH (ref 0.44–1.00)
GFR, Estimated: 20 mL/min — ABNORMAL LOW (ref >60)
Glucose, Bld: 153 mg/dL — ABNORMAL HIGH (ref 70–99)
Potassium: 4.3 mmol/L (ref 3.5–5.1)
Sodium: 130 mmol/L — ABNORMAL LOW (ref 135–145)

## 2021-03-24 LAB — GLUCOSE, CAPILLARY
Glucose-Capillary: 186 mg/dL — ABNORMAL HIGH (ref 70–99)
Glucose-Capillary: 145 mg/dL — ABNORMAL HIGH (ref 70–99)
Glucose-Capillary: 162 mg/dL — ABNORMAL HIGH (ref 70–99)

## 2021-03-24 MED ORDER — SACCHAROMYCES BOULARDII 250 MG PO CAPS
250.0000 mg | ORAL_CAPSULE | Freq: Two times a day (BID) | ORAL | 0 refills | Status: AC
  Filled 2021-03-24: qty 28, 14d supply, fill #0

## 2021-03-24 MED ORDER — SACCHAROMYCES BOULARDII 250 MG PO CAPS
250.0000 mg | ORAL_CAPSULE | Freq: Two times a day (BID) | ORAL | Status: DC
  Administered 2021-03-24: 250 mg via ORAL
  Filled 2021-03-24: qty 1

## 2021-03-24 MED ORDER — AMLODIPINE BESYLATE 5 MG PO TABS
5.0000 mg | ORAL_TABLET | Freq: Every day | ORAL | 0 refills | Status: DC
  Filled 2021-03-24: qty 90, 90d supply, fill #0

## 2021-03-24 MED ORDER — CEPHALEXIN 500 MG PO CAPS
500.0000 mg | ORAL_CAPSULE | Freq: Three times a day (TID) | ORAL | Status: DC
  Administered 2021-03-24: 500 mg via ORAL
  Filled 2021-03-24: qty 1

## 2021-03-24 MED ORDER — CEPHALEXIN 500 MG PO CAPS
500.0000 mg | ORAL_CAPSULE | Freq: Three times a day (TID) | ORAL | 0 refills | Status: AC
  Filled 2021-03-24: qty 21, 7d supply, fill #0

## 2021-03-24 MED ORDER — ADULT MULTIVITAMIN W/MINERALS CH
1.0000 | ORAL_TABLET | Freq: Every day | ORAL | 0 refills | Status: DC
  Filled 2021-03-24: qty 90, 90d supply, fill #0

## 2021-03-24 NOTE — TOC Transition Note (Signed)
Transition of Care [facility]) - CM/SW Discharge Note  Patient Details  Name: Makayla [name] MRN: [id number] Date of Birth: 27-Jul-1965  Transition of [facility] CM/SW Contact:  Sherie Don, LCSW Phone Number: 03/24/2021, 12:52 PM  Clinical Narrative: PT evaluation recommended HHPT. Patient has been set up with [facility]. CSW updated patient's sisters. TOC signing off.  Final next level of care: [place] Barriers to Discharge: Barriers Resolved  Patient Goals and CMS Choice Patient states their goals for this hospitalization and ongoing recovery are:: Get home health CMS Medicare.gov Compare Post Acute Care list provided to:: Patient Represenative (must comment) Choice offered to / list presented to : Sibling  Discharge Plan and Services        DME Arranged: N/A DME Agency: NA HH Arranged: PT HH Agency: [place] Date [place]: 03/24/21 Representative spoke with at [place]: Amy  Readmission Risk Interventions No flowsheet data found.

## 2021-03-24 NOTE — Care Management Important Message (Signed)
Important Message  Patient Details IM Letter given to the Patient. Name: Makayla [name] MRN: [id number] Date of Birth: 10/24/1965   Medicare Important Message Given:  Yes     Kerin Salen 03/24/2021, 1:29 PM

## 2021-03-24 NOTE — Progress Notes (Signed)
Nurse reviewed discharge instructions with pt.  Pt verbalized understanding of discharge instructions, follow up appointment and new medications.  No concerns at time of discharge.  Pt's sister [name] picking pt up and taking her home.

## 2021-03-24 NOTE — Progress Notes (Signed)
Physical Therapy Treatment Patient Details Name: Makayla [name] MRN: [id number] DOB: Nov 07, 1965 Today's Date: 03/24/2021   History of Present Illness Patient is 55 y.o. female presenting to ED for hypoglycemia and LE spasms and pain in abdominal area. PMH significant for DMII, CKD III, HTN, HLD, obesity, neuropathy, cellulitis.    PT Comments    Patient progressing gradually and ambulated increased distance with RW in hall today. She fatigues quickly and required multiple/frequent rest breaks both standing and seated. Pt mildly unsteady with reduced weight shift Lt due to pain and occasional lateral tip of RW due to uneven weight distribution. Cues for safety and walker management. Discussed discharge plan and concern for stair mobility to enter home. Pt verbalized prior strategy of side step technique and single railing at her mothers home. She will benefit form bariatric walker for safe ambulation at home. Acute PT will continue to follow as able. Recommend HHPT f/u.   Recommendations for follow up therapy are one component of a multi-disciplinary discharge planning process, led by the attending physician.  Recommendations may be updated based on patient status, additional functional criteria and insurance authorization.  Follow Up Recommendations  Home health PT (HHPT vs no follow up pending progress)     Assistance Recommended at Discharge Intermittent Supervision/Assistance  Equipment Recommendations  Rolling walker (2 wheels) (bariatric)    Recommendations for Other Services       Precautions / Restrictions Precautions Precautions: Fall Restrictions Weight Bearing Restrictions: No Other Position/Activity Restrictions: WBAT     Mobility  Bed Mobility               General bed mobility comments: pt sitting EOB at start    Transfers Overall transfer level: Needs assistance Equipment used: Rolling walker (2 wheels) Transfers: Sit to/from Stand Sit to Stand: Min  guard;Min assist           General transfer comment: pt using some momentum and bil UE use to power up from EOB. pt with multiple attempts to rise and unable cues required to position hands on RW to facilitate anterior weight shift for improved power up. assist to stabilize walker.    Ambulation/Gait Ambulation/Gait assistance: Min guard;Min assist Gait Distance (Feet): 60 Feet (10,20,30) Assistive device: Rolling walker (2 wheels) Gait Pattern/deviations: Step-through pattern;Decreased step length - right;Decreased step length - left;Decreased stride length;Wide base of support;Shuffle;Antalgic (WBOS for habitus) Gait velocity: decr     General Gait Details: pt overall steady with wide BOS to accomodate habitus and mild antalgia noted with reduced stance time on Lt LE due to pain on Lt side. pt required frequent standing rest breaks and 2 seated breaks due to fatigue and Lt LE pain. intermittent assist for walker to steady.   Stairs             Wheelchair Mobility    Modified Rankin (Stroke Patients Only)       Balance Overall balance assessment: Mild deficits observed, not formally tested;Needs assistance Sitting-balance support: Feet supported;Single extremity supported;No upper extremity supported   Sitting balance - Comments: pt able to don sock in sitting EOB on Rt foot. pt taking significant extra time to weight shift at EOB and attempt donning Lt sock. assist required to complete.                                    Cognition Arousal/Alertness: Awake/alert Behavior During Therapy: WFL for tasks assessed/performed Overall  Cognitive Status: Within Functional Limits for tasks assessed                                          Exercises      General Comments        Pertinent Vitals/Pain Pain Assessment: No/denies pain    Home Living                          Prior Function            PT Goals (current goals  can now be found in the care plan section) Acute Rehab PT Goals Patient Stated Goal: get recovered and home PT Goal Formulation: With patient Time For Goal Achievement: 04/05/21 Potential to Achieve Goals: Good Progress towards PT goals: Progressing toward goals    Frequency    Min 3X/week      PT Plan      Co-evaluation              AM-PAC PT "6 Clicks" Mobility   Outcome Measure  Help needed turning from your back to your side while in a flat bed without using bedrails?: None Help needed moving from lying on your back to sitting on the side of a flat bed without using bedrails?: None Help needed moving to and from a bed to a chair (including a wheelchair)?: A Little Help needed standing up from a chair using your arms (e.g., wheelchair or bedside chair)?: A Little Help needed to walk in hospital room?: A Little Help needed climbing 3-5 steps with a railing? : A Lot 6 Click Score: 19    End of Session Equipment Utilized During Treatment: Gait belt Activity Tolerance: Patient tolerated treatment well Patient left: in chair;with call bell/phone within reach;with chair alarm set;with family/visitor present Nurse Communication: Mobility status PT Visit Diagnosis: Muscle weakness (generalized) (M62.81);Difficulty in walking, not elsewhere classified (R26.2)     Time: 2919-1660 PT Time Calculation (min) (ACUTE ONLY): 40 min  Charges:  $Gait Training: 23-37 mins $Therapeutic Activity: 8-22 mins                     Verner Mould, DPT Acute Rehabilitation Services Office [phone] Pager [phone]    Jacques Navy 03/24/2021, 2:01 PM

## 2021-03-24 NOTE — Discharge Summary (Addendum)
Discharge Summary  Makayla [name] RKY:[id number] DOB: [birth year]  PCP: Vonna Drafts, FNP  Admit date: 03/21/2021 Discharge date: 03/24/2021  Time spent: 35 minutes.  Recommendations for Outpatient Follow-up:  Follow-up with your PCP within a week Follow-up with gynecology in 1 to 2 weeks Take your medications as prescribed Continue PT with assistance and fall precautions.  Discharge Diagnoses:  Active Hospital Problems   Diagnosis Date Noted   Cellulitis of abdominal wall 03/21/2021   Cellulitis 03/22/2021   Type 2 diabetes mellitus with hypoglycemia without coma ([place]) 03/21/2021   Stage 3b chronic kidney disease (CKD) ([place]) 03/21/2021   Elevated CK 03/21/2021   Ovarian mass, left 03/21/2021   Obesity, morbid ([place]) 12/08/2012   Hypertension associated with diabetes ([place]) 05/09/2009   Hyperlipidemia associated with type 2 diabetes mellitus ([place]) 05/09/2009    Resolved Hospital Problems  No resolved problems to display.    Discharge Condition: Stable  Diet recommendation: Resume previous diet  Vitals:   03/24/21 0820 03/24/21 1450  BP:    Pulse:    Resp:    Temp:    SpO2: 94% 94%    History of present illness:   Makayla [name] is a 55 y.o. female with medical history significant for insulin-dependent type 2 diabetes, CKD stage IIIb, HTN, HLD, and morbid obesity who is admitted with abdominal wall cellulitis and hypoglycemia.  Hospital course complicated by AKI on CKD 3 for which her home benazepril was held and she received IV fluids with improvement of her creatinine 2.78 from 2.98.   03/24/21: Seen at her bedside.  There were no acute events overnight.  She has no new complaints.  She is eager to go home.  Hospital Course:  Principal Problem:   Cellulitis of abdominal wall Active Problems:   Hyperlipidemia associated with type 2 diabetes mellitus ([place])   Hypertension associated with diabetes ([place])   Obesity, morbid ([place])   Type 2 diabetes mellitus with  hypoglycemia without coma ([facility])   Stage 3b chronic kidney disease (CKD) ([facility])   Elevated CK   Ovarian mass, left   Cellulitis * Cellulitis of abdominal wall Cellulitic changes present beneath abdominal pannus.  Received IV Ancef switch to Keflex 500 mg 3 times daily x7 days on 03/24/2021.  Florastor 250 mg twice daily x14 days. Blood cultures negative to date Follow-up with your PCP   Ovarian mass, left Large left ovarian mass seen on CT A/P 03/21/2021 with maximal dimension 12.4 cm.  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 Follow-up with gynecology   Elevated CK CK 1060 on admission.  CPK downtrending. -She received IV fluid hydration. Follow-up with your primary care provider   Type 2 diabetes mellitus with hypoglycemia without coma ([facility]) Hemoglobin A1c 6.0 on 03/22/2021 Resume home regimen and closely follow-up with PCP within a week Avoid hypoglycemia   Obesity, morbid ([place]) Estimated body mass index is 53.21 kg/m as calculated from the following:   Height as of this encounter: 5\' 4"  (1.626 m).   Weight as of this encounter: 140.6 kg.   Hypertension associated with diabetes ([place]) Continue home amlodipine Hold off home benazepril due to AKI   Improving nonoliguric AKI on CKD 3B Hold off on benazepril, received gentle IV fluid hydration with improvement of renal function, creatinine 2.78 from 2.98. Continue to avoid nephrotoxic agents and dehydration. Repeat BMP in 1 week at PCPs office.  Ambulatory dysfunction PT assessed and recommended home health PT Continue PT with assistance and fall precautions TOC consulted to  assist with home health services arrangement  Discharge Exam: BP 134/67 (BP Location: Right Arm)    Pulse (!) 103    Temp 99.5 F (37.5 C) (Oral)    Resp 18    Ht 5\' 4"  (1.626 m)    Wt (!) 140.6 kg    LMP 12/08/2012    SpO2 94%    BMI 53.21 kg/m  General: 55 y.o. year-old female well developed well nourished in no acute distress.   Alert and oriented x3. Cardiovascular: Regular rate and rhythm with no rubs or gallops.  No thyromegaly or JVD noted.   Respiratory: Clear to auscultation with no wheezes or rales. Good inspiratory effort. Abdomen: Soft nontender nondistended with normal bowel sounds x4 quadrants. Musculoskeletal: No lower extremity edema. 2/4 pulses in all 4 extremities. Skin: No ulcerative lesions noted or rashes, Psychiatry: Mood is appropriate for condition and setting  Discharge Instructions You were cared for by a hospitalist during your hospital stay. If you have any questions about your discharge medications or the care you received while you were in the hospital after you are discharged, you can call the unit and asked to speak with the hospitalist on call if the hospitalist that took care of you is not available. Once you are discharged, your primary care physician will handle any further medical issues. Please note that NO REFILLS for any discharge medications will be authorized once you are discharged, as it is imperative that you return to your primary care physician (or establish a relationship with a primary care physician if you do not have one) for your aftercare needs so that they can reassess your need for medications and monitor your lab values.   Allergies as of 03/24/2021       Reactions   Ibuprofen Other (See Comments)   Can not take due to kidney problems.    Peach Flavor Swelling   Peaches.    Penicillins    Inflates bronchitis.  Has patient had a PCN reaction causing immediate rash, facial/tongue/throat swelling, SOB or lightheadedness with hypotension: Yes- shortness of breathe.  Has patient had a PCN reaction causing severe rash involving mucus membranes or skin necrosis: No Has patient had a PCN reaction that required hospitalization No Has patient had a PCN reaction occurring within the last 10 years: No If all of the above answers are "NO", then may proceed with Cephalosporin  use.   Strawberry Extract Swelling        Medication List     STOP taking these medications    amLODipine-benazepril 5-10 MG capsule Commonly known as: LOTREL Replaced by: amLODipine 5 MG tablet   diclofenac sodium 1 % Gel Commonly known as: Voltaren   nystatin cream Commonly known as: MYCOSTATIN   Tresiba FlexTouch 200 UNIT/ML FlexTouch Pen Generic drug: insulin degludec       TAKE these medications    acetaminophen 325 MG tablet Commonly known as: TYLENOL Take 650 mg by mouth every 6 (six) hours as needed for moderate pain.   albuterol 108 (90 Base) MCG/ACT inhaler Commonly known as: VENTOLIN HFA Inhale 2 puffs into the lungs every 6 (six) hours as needed for wheezing or shortness of breath. For shortness of breath   amLODipine 5 MG tablet Commonly known as: NORVASC Take 1 tablet by mouth daily. Replaces: amLODipine-benazepril 5-10 MG capsule   B-D UF III MINI PEN NEEDLES 31G X 5 MM Misc Generic drug: Insulin Pen Needle See admin instructions.   cephALEXin 500 MG capsule Commonly  known as: KEFLEX Take 1 capsule by mouth every 8 hours for 7 days.   chlorzoxazone 500 MG tablet Commonly known as: PARAFON Take 500 mg by mouth daily as needed for muscle spasms.   cyclobenzaprine 5 MG tablet Commonly known as: FLEXERIL Take 1 tablet twice a day as needed for Muscle spasms. What changed:  how much to take how to take this when to take this reasons to take this additional instructions   gabapentin 400 MG capsule Commonly known as: NEURONTIN Take 400 mg by mouth daily as needed (pain).   glipiZIDE 5 MG tablet Commonly known as: GLUCOTROL Take 5 mg by mouth daily before breakfast.   glucose blood test strip Commonly known as: OneTouch Verio Use as instructed to check blood sugar 2 times daily Dx code E11.9   HYDROcodone-acetaminophen 5-325 MG tablet Commonly known as: Norco Take 2 tablets by mouth every 4 (four) hours as needed. What changed:  reasons to take this   Insulin Syringe-Needle U-100 29G X 1/2" 0.3 ML Misc Commonly known as: SAFETY-GLIDE 0.3CC SYR 29GX1/2 Use as directed.   multivitamin with minerals Tabs tablet Take 1 tablet by mouth daily.   saccharomyces boulardii 250 MG capsule Commonly known as: FLORASTOR Take 1 capsule (250 mg total) by mouth 2 (two) times daily for 14 days.   Symbicort 160-4.5 MCG/ACT inhaler Generic drug: budesonide-formoterol Inhale 2 puffs into the lungs 2 (two) times daily.   Victoza 18 MG/3ML Sopn Generic drug: liraglutide Inject 0.6 mg into the skin 2 (two) times daily.       Allergies  Allergen Reactions   Ibuprofen Other (See Comments)    Can not take due to kidney problems.    Peach Flavor Swelling    Peaches.    Penicillins     Inflates bronchitis.  Has patient had a PCN reaction causing immediate rash, facial/tongue/throat swelling, SOB or lightheadedness with hypotension: Yes- shortness of breathe.  Has patient had a PCN reaction causing severe rash involving mucus membranes or skin necrosis: No Has patient had a PCN reaction that required hospitalization No Has patient had a PCN reaction occurring within the last 10 years: No If all of the above answers are "NO", then may proceed with Cephalosporin use.    Strawberry Extract Swelling    Follow-up Information     Vonna Drafts, FNP. Call today.   Specialty: Nurse Practitioner Why: please call for a post hospital follow up appointment. Contact information: [address] [place] [address] [phone]         [facility] Follow up.   Why: PT                 The results of significant diagnostics from this hospitalization (including imaging, microbiology, ancillary and laboratory) are listed below for reference.    Significant Diagnostic Studies: CT ABDOMEN PELVIS WO CONTRAST  Result Date: 03/21/2021 CLINICAL DATA:  Sepsis, abdominal pain EXAM: CT ABDOMEN  AND PELVIS WITHOUT CONTRAST TECHNIQUE: Multidetector CT imaging of the abdomen and pelvis was performed following the standard protocol without IV contrast. Unenhanced CT was performed per clinician order. Lack of IV contrast limits sensitivity and specificity, especially for evaluation of abdominal/pelvic solid viscera. COMPARISON:  None. FINDINGS: Lower chest: No acute pleural or parenchymal lung disease. Hepatobiliary: Calcified gallstones without cholecystitis. Unremarkable unenhanced appearance of the liver. Pancreas: Unremarkable unenhanced appearance. Spleen: Unremarkable unenhanced appearance. Adrenals/Urinary Tract: No urinary tract calculi or obstructive uropathy. The adrenals are unremarkable. There  is moderate distention of the bladder without filling defect. Stomach/Bowel: No bowel obstruction or ileus. Normal appendix right lower quadrant. No bowel wall thickening or inflammatory change. Vascular/Lymphatic: No significant vascular findings are present. No enlarged abdominal or pelvic lymph nodes. Reproductive: Intrauterine calcifications most consistent with degenerating fibroids. There is a large left ovarian mass containing macroscopic fat and calcification, measuring 11.0 x 12.4 x 9.3 cm consistent with ovarian dermoid. No right adnexal mass. Other: No free fluid or free gas.  No abdominal wall hernia. Musculoskeletal: There are no acute bony abnormalities. There is severe bilateral hip osteoarthritis, left greater than right. Imaging was performed through the perineum, with no inflammatory changes in the perineum or labia to suggest infection. However, there is subcutaneous fat stranding and dermal thickening within the inferior margin of the abdominal pannus, not completely imaged on this study, which could reflect abdominal wall cellulitis. No fluid collection or evidence of abscess. Reconstructed images demonstrate no additional findings. IMPRESSION: 1. Large left ovarian mass consistent with  dermoid. Maximal dimension 12.4 cm. 2. Subcutaneous fat stranding and dermal thickening within the inferior margin of the abdominal wall pannus, which could reflect cellulitis. No fluid collection or abscess. 3. Cholelithiasis without cholecystitis. 4. Fibroid uterus. 5. Severe bilateral hip osteoarthritis, left greater than right. No acute fractures. Electronically Signed   By: Randa Ngo M.D.   On: 03/21/2021 17:28   DG Chest 2 View  Result Date: 03/21/2021 CLINICAL DATA:  Short of breath, fell yesterday, back pain EXAM: CHEST - 2 VIEW COMPARISON:  12/08/2012 FINDINGS: Frontal and lateral views of the chest demonstrate an unremarkable cardiac silhouette. No acute airspace disease, effusion, or pneumothorax. There are no acute bony abnormalities. IMPRESSION: 1. No acute intrathoracic process. Electronically Signed   By: Randa Ngo M.D.   On: 03/21/2021 15:17    Microbiology: Recent Results (from the past 240 hour(s))  Resp Panel by RT-PCR (Flu A&B, Covid) Nasopharyngeal Swab     Status: None   Collection Time: 03/21/21  4:28 PM   Specimen: Nasopharyngeal Swab; Nasopharyngeal(NP) swabs in vial transport medium  Result Value Ref Range Status   SARS Coronavirus 2 by RT PCR NEGATIVE NEGATIVE Final    Comment: (NOTE) SARS-CoV-2 target nucleic acids are NOT DETECTED.  The SARS-CoV-2 RNA is generally detectable in upper respiratory specimens during the acute phase of infection. The lowest concentration of SARS-CoV-2 viral copies this assay can detect is 138 copies/mL. A negative result does not preclude SARS-Cov-2 infection and should not be used as the sole basis for treatment or other patient management decisions. A negative result may occur with  improper specimen collection/handling, submission of specimen other than nasopharyngeal swab, presence of viral mutation(s) within the areas targeted by this assay, and inadequate number of viral copies(<138 copies/mL). A negative result must  be combined with clinical observations, patient history, and epidemiological information. The expected result is Negative.  Fact Sheet for Patients:  EntrepreneurPulse.com.au  Fact Sheet for Healthcare Providers:  IncredibleEmployment.be  This test is no t yet approved or cleared by the [place] FDA and  has been authorized for detection and/or diagnosis of SARS-CoV-2 by FDA under an Emergency Use Authorization (EUA). This EUA will remain  in effect (meaning this test can be used) for the duration of the COVID-19 declaration under Section 564(b)(1) of the Act, 21 U.S.C.section 360bbb-3(b)(1), unless the authorization is terminated  or revoked sooner.       Influenza A by PCR NEGATIVE NEGATIVE Final   Influenza B  by PCR NEGATIVE NEGATIVE Final    Comment: (NOTE) The Xpert Xpress SARS-CoV-2/FLU/RSV plus assay is intended as an aid in the diagnosis of influenza from Nasopharyngeal swab specimens and should not be used as a sole basis for treatment. Nasal washings and aspirates are unacceptable for Xpert Xpress SARS-CoV-2/FLU/RSV testing.  Fact Sheet for Patients: EntrepreneurPulse.com.au  Fact Sheet for Healthcare Providers: IncredibleEmployment.be  This test is not yet approved or cleared by the [place] FDA and has been authorized for detection and/or diagnosis of SARS-CoV-2 by FDA under an Emergency Use Authorization (EUA). This EUA will remain in effect (meaning this test can be used) for the duration of the COVID-19 declaration under Section 564(b)(1) of the Act, 21 U.S.C. section 360bbb-3(b)(1), unless the authorization is terminated or revoked.  Performed at [facility], [place] [address], [place], [place] [address]   Blood culture (routine x 2)     Status: None (Preliminary result)   Collection Time: 03/21/21  4:30 PM   Specimen: BLOOD  Result Value Ref Range Status    Specimen Description   Final    BLOOD RIGHT ANTECUBITAL Performed at [facility] [address], [place], [place] [address]    Special Requests   Final    Blood Culture results may not be optimal due to an excessive volume of blood received in culture bottles BOTTLES DRAWN AEROBIC AND ANAEROBIC Performed at [facility], [place] [address] [place], [place] [address]    Culture   Final    NO GROWTH 3 DAYS Performed at [facility], [place] [address] [place], [place] [address]    Report Status PENDING  Incomplete  Blood culture (routine x 2)     Status: None (Preliminary result)   Collection Time: 03/21/21  5:30 PM   Specimen: BLOOD  Result Value Ref Range Status   Specimen Description   Final    BLOOD RIGHT FOREARM Performed at [facility] [address] [place], [place] [address]    Special Requests   Final    BOTTLES DRAWN AEROBIC AND ANAEROBIC Blood Culture adequate volume Performed at [facility] [address], [place], [place] [address]    Culture   Final    NO GROWTH 3 DAYS Performed at [facility], [place] [address], [place], [place] [address]    Report Status PENDING  Incomplete     Labs: Basic Metabolic Panel: Recent Labs  Lab 03/21/21 1456 03/22/21 0427 03/23/21 0422 03/24/21 0607  NA 135 134* 133* 130*  K 3.6 3.7 3.9 4.3  CL 100 103 100 102  CO2 25 21* 23 19*  GLUCOSE 93 82 114* 153*  BUN 14 15 24* 32*  CREATININE 1.92* 1.85* 2.98* 2.78*  CALCIUM 9.1 8.8* 8.4* 8.7*   Liver Function Tests: Recent Labs  Lab 03/21/21 1456  AST 57*  ALT 23  ALKPHOS 85  BILITOT 0.6  PROT 8.5*  ALBUMIN 3.5   No results for input(s): LIPASE, AMYLASE in the last 168 hours. No results for input(s): AMMONIA in the last 168 hours. CBC: Recent Labs  Lab 03/21/21 1456 03/22/21 0427 03/23/21 0422  WBC 5.4 7.0 5.7  NEUTROABS 3.9  --   --   HGB 15.1* 13.1 12.1  HCT 47.9* 42.6  38.3  MCV 87.9 89.5 88.2  PLT 212 180 158   Cardiac Enzymes: Recent Labs  Lab 03/21/21 1644 03/22/21 0427 03/23/21 0422  CKTOTAL 1,060* 1,029* 736*  BNP: BNP (last 3 results) No results for input(s): BNP in the last 8760 hours.  ProBNP (last 3 results) No results for input(s): PROBNP in the last 8760 hours.  CBG: Recent Labs  Lab 03/23/21 1956 03/23/21 2352 03/24/21 0407 03/24/21 0733 03/24/21 1158  GLUCAP 250* 193* 186* 145* 162*       Signed:  Kayleen Memos, MD [facility] 03/24/2021, 5:15 PM

## 2021-03-26 LAB — CULTURE, BLOOD (ROUTINE X 2)
Culture: NO GROWTH
Culture: NO GROWTH
Special Requests: ADEQUATE

## 2021-04-04 ENCOUNTER — Encounter (HOSPITAL_COMMUNITY): Payer: Self-pay

## 2021-04-04 ENCOUNTER — Other Ambulatory Visit: Payer: Self-pay

## 2021-04-04 ENCOUNTER — Emergency Department (HOSPITAL_COMMUNITY)
Admission: EM | Admit: 2021-04-04 | Discharge: 2021-04-05 | Disposition: A | Discharge status: Home / Self Care | Payer: Medicare Other | Attending: Emergency Medicine | Admitting: Emergency Medicine

## 2021-04-04 DIAGNOSIS — Z79899 Other long term (current) drug therapy: Secondary | ICD-10-CM | POA: Insufficient documentation

## 2021-04-04 DIAGNOSIS — N1832 Chronic kidney disease, stage 3b: Secondary | ICD-10-CM | POA: Insufficient documentation

## 2021-04-04 DIAGNOSIS — Z794 Long term (current) use of insulin: Secondary | ICD-10-CM | POA: Insufficient documentation

## 2021-04-04 DIAGNOSIS — M791 Myalgia, unspecified site: Secondary | ICD-10-CM | POA: Diagnosis present

## 2021-04-04 DIAGNOSIS — R000 Tachycardia, unspecified: Secondary | ICD-10-CM | POA: Diagnosis not present

## 2021-04-04 DIAGNOSIS — R531 Weakness: Secondary | ICD-10-CM | POA: Diagnosis not present

## 2021-04-04 DIAGNOSIS — F17210 Nicotine dependence, cigarettes, uncomplicated: Secondary | ICD-10-CM | POA: Insufficient documentation

## 2021-04-04 DIAGNOSIS — R252 Cramp and spasm: Secondary | ICD-10-CM

## 2021-04-04 DIAGNOSIS — E1122 Type 2 diabetes mellitus with diabetic chronic kidney disease: Secondary | ICD-10-CM | POA: Insufficient documentation

## 2021-04-04 DIAGNOSIS — I129 Hypertensive chronic kidney disease with stage 1 through stage 4 chronic kidney disease, or unspecified chronic kidney disease: Secondary | ICD-10-CM | POA: Insufficient documentation

## 2021-04-04 DIAGNOSIS — Z20822 Contact with and (suspected) exposure to covid-19: Secondary | ICD-10-CM | POA: Insufficient documentation

## 2021-04-04 LAB — RESP PANEL BY RT-PCR (FLU A&B, COVID) ARPGX2
Influenza A by PCR: NEGATIVE
Influenza B by PCR: NEGATIVE
SARS Coronavirus 2 by RT PCR: NEGATIVE

## 2021-04-04 LAB — COMPREHENSIVE METABOLIC PANEL
ALT: 8 U/L (ref 0–44)
AST: 30 U/L (ref 15–41)
Albumin: 3 g/dL — ABNORMAL LOW (ref 3.5–5.0)
Alkaline Phosphatase: 67 U/L (ref 38–126)
Anion gap: 11 (ref 5–15)
BUN: 12 mg/dL (ref 6–20)
CO2: 21 mmol/L — ABNORMAL LOW (ref 22–32)
Calcium: 8.7 mg/dL — ABNORMAL LOW (ref 8.9–10.3)
Chloride: 97 mmol/L — ABNORMAL LOW (ref 98–111)
Creatinine, Ser: 1.39 mg/dL — ABNORMAL HIGH (ref 0.44–1.00)
GFR, Estimated: 45 mL/min — ABNORMAL LOW (ref >60)
Glucose, Bld: 324 mg/dL — ABNORMAL HIGH (ref 70–99)
Potassium: 3.9 mmol/L (ref 3.5–5.1)
Sodium: 129 mmol/L — ABNORMAL LOW (ref 135–145)
Total Bilirubin: 0.9 mg/dL (ref 0.3–1.2)
Total Protein: 8 g/dL (ref 6.5–8.1)

## 2021-04-04 LAB — CBC WITH DIFFERENTIAL/PLATELET
Abs Immature Granulocytes: 0.13 10*3/uL — ABNORMAL HIGH (ref 0.00–0.07)
Basophils Absolute: 0 10*3/uL (ref 0.0–0.1)
Basophils Relative: 0 %
Eosinophils Absolute: 0 10*3/uL (ref 0.0–0.5)
Eosinophils Relative: 0 %
HCT: 41.5 % (ref 36.0–46.0)
Hemoglobin: 13.2 g/dL (ref 12.0–15.0)
Immature Granulocytes: 1 %
Lymphocytes Relative: 11 %
Lymphs Abs: 1.3 10*3/uL (ref 0.7–4.0)
MCH: 27.4 pg (ref 26.0–34.0)
MCHC: 31.8 g/dL (ref 30.0–36.0)
MCV: 86.3 fL (ref 80.0–100.0)
Monocytes Absolute: 1.5 10*3/uL — ABNORMAL HIGH (ref 0.1–1.0)
Monocytes Relative: 13 %
Neutro Abs: 8.7 10*3/uL — ABNORMAL HIGH (ref 1.7–7.7)
Neutrophils Relative %: 75 %
Platelets: 317 10*3/uL (ref 150–400)
RBC: 4.81 MIL/uL (ref 3.87–5.11)
RDW: 14 % (ref 11.5–15.5)
WBC: 11.7 10*3/uL — ABNORMAL HIGH (ref 4.0–10.5)
nRBC: 0 % (ref 0.0–0.2)

## 2021-04-04 LAB — CBG MONITORING, ED: Glucose-Capillary: 316 mg/dL — ABNORMAL HIGH (ref 70–99)

## 2021-04-04 LAB — MAGNESIUM: Magnesium: 1.2 mg/dL — ABNORMAL LOW (ref 1.7–2.4)

## 2021-04-04 LAB — CK: Total CK: 288 U/L — ABNORMAL HIGH (ref 38–234)

## 2021-04-04 MED ORDER — DIAZEPAM 5 MG/ML IJ SOLN
5.0000 mg | Freq: Once | INTRAMUSCULAR | Status: AC
  Administered 2021-04-04: 22:00:00 5 mg via INTRAVENOUS
  Filled 2021-04-04: qty 2

## 2021-04-04 MED ORDER — LACTATED RINGERS IV BOLUS
1000.0000 mL | Freq: Once | INTRAVENOUS | Status: AC
  Administered 2021-04-04: 20:00:00 1000 mL via INTRAVENOUS

## 2021-04-04 MED ORDER — MAGNESIUM SULFATE 2 GM/50ML IV SOLN
2.0000 g | Freq: Once | INTRAVENOUS | Status: AC
  Administered 2021-04-04: 22:00:00 2 g via INTRAVENOUS
  Filled 2021-04-04: qty 50

## 2021-04-04 MED ORDER — LACTATED RINGERS IV BOLUS
1000.0000 mL | Freq: Once | INTRAVENOUS | Status: AC
  Administered 2021-04-05: 01:00:00 1000 mL via INTRAVENOUS

## 2021-04-04 NOTE — ED Notes (Signed)
Unable to draw labs from IV site. Will attempt again

## 2021-04-04 NOTE — ED Provider Notes (Addendum)
[facility] DEPT Provider Note   CSN: [id number] Arrival date & time: 04/04/21  1828     History Chief Complaint  Patient presents with   Leg Pain    Makayla [name] is a 55 y.o. female.  HPI Patient presents via EMS from home after being stuck on the toilet.  EMS states that she was on there for 1.5 hours.  Patient states that she was on there since yesterday.  She normally walks with a walker.  She was able to ambulate normally to the toilet.  Once on the toilet, she was not able to get up.  She does have her 39 year old mother at home.  She called to her mother and ultimately, EMS was called.  Patient endorses muscle cramps and pain in her bilateral lower extremities.  She states that she has a wound on her buttocks from the toilet seat.  Medical history notable for DM2, morbid obesity, CKD 3.  She had a recent hospital admission for abdominal wall/pannus cellulitis and AKI.  She was treated with Ancef and transition to oral Keflex.  She was discharged 11 days ago with prescription for 7 days of Keflex.  She states that she took only 1 of these Keflex tablets.    Past Medical History:  Diagnosis Date   Cellulitis    Chronic bronchitis ([employer])    CKD (chronic kidney disease), stage III ([place])    Diabetes mellitus    Hyperlipidemia    Hypertension    Neuropathy     Patient Active Problem List   Diagnosis Date Noted   Cellulitis 03/22/2021   Cellulitis of abdominal wall 03/21/2021   Type 2 diabetes mellitus with hypoglycemia without coma ([place]) 03/21/2021   Stage 3b chronic kidney disease (CKD) ([place]) 03/21/2021   Elevated CK 03/21/2021   Ovarian mass, left 03/21/2021   Renal failure, acute on chronic ([place]) 12/10/2012   Hypokalemia 12/08/2012   ARF (acute renal failure) ([place]) 12/08/2012   Dehydration 12/08/2012   Obesity, morbid ([place]) 12/08/2012   Tobacco abuse 12/08/2012   SNORING 06/16/2009   Diabetes mellitus without complication ([place]) 18/29/9371    Hyperlipidemia associated with type 2 diabetes mellitus ([place]) 05/09/2009   Hypertension associated with diabetes ([place]) 05/09/2009    Past Surgical History:  Procedure Laterality Date   TONSILLECTOMY       OB History   No obstetric history on file.     Family History  Problem Relation Age of Onset   Diabetes Mother    Hypertension Mother    Hyperlipidemia Mother    Diabetes Father    Heart disease Father    Diabetes Sister     Social History   Tobacco Use   Smoking status: Some Days    Packs/day: 0.10    Years: 23.00    Pack years: 2.30    Types: Cigarettes   Smokeless tobacco: Never  Vaping Use   Vaping Use: Never used  Substance Use Topics   Alcohol use: Not Currently    Comment: Occasional use: 4 drinks per occasion, once a month   Drug use: No    Home Medications Prior to Admission medications   Medication Sig Start Date End Date Taking? Authorizing Provider  acetaminophen (TYLENOL) 325 MG tablet Take 650 mg by mouth every 6 (six) hours as needed for moderate pain.    [provider]  albuterol (PROVENTIL HFA;VENTOLIN HFA) 108 (90 BASE) MCG/ACT inhaler Inhale 2 puffs into the lungs every 6 (six) hours as  needed for wheezing or shortness of breath. For shortness of breath    [provider]  amLODipine (NORVASC) 5 MG tablet Take 1 tablet by mouth daily. 03/24/21 06/22/21  Kayleen Memos, DO  B-D UF III MINI PEN NEEDLES 31G X 5 MM MISC See admin instructions. 04/28/15   [provider]  chlorzoxazone (PARAFON) 500 MG tablet Take 500 mg by mouth daily as needed for muscle spasms.    [provider]  cyclobenzaprine (FLEXERIL) 5 MG tablet Take 1 tablet twice a day as needed for Muscle spasms. Patient taking differently: Take 5 mg by mouth daily as needed for muscle spasms. 01/31/15   Elayne Snare, MD  gabapentin (NEURONTIN) 400 MG capsule Take 400 mg by mouth daily as needed (pain).    [provider]  glipiZIDE  (GLUCOTROL) 5 MG tablet Take 5 mg by mouth daily before breakfast.    [provider]  glucose blood (ONETOUCH VERIO) test strip Use as instructed to check blood sugar 2 times daily Dx code E11.9 11/03/14   Elayne Snare, MD  HYDROcodone-acetaminophen (NORCO) 5-325 MG tablet Take 2 tablets by mouth every 4 (four) hours as needed. Patient taking differently: Take 2 tablets by mouth every 4 (four) hours as needed for severe pain. 10/22/20   Daleen Bo, MD  Insulin Syringe-Needle U-100 (SAFETY-GLIDE 0.3CC SYR 29GX1/2) 29G X 1/2" 0.3 ML MISC Use as directed. 12/11/12   Hongalgi, Lenis Dickinson, MD  liraglutide (VICTOZA) 18 MG/3ML SOPN Inject 0.6 mg into the skin 2 (two) times daily.    [provider]  Multiple Vitamin (MULTIVITAMIN WITH MINERALS) TABS tablet Take 1 tablet by mouth daily. 03/24/21 06/22/21  Kayleen Memos, DO  saccharomyces boulardii (FLORASTOR) 250 MG capsule Take 1 capsule (250 mg total) by mouth 2 (two) times daily for 14 days. 03/24/21 04/07/21  Kayleen Memos, DO  SYMBICORT 160-4.5 MCG/ACT inhaler Inhale 2 puffs into the lungs 2 (two) times daily. 10/02/17   [provider]    Allergies    Ibuprofen, Peach flavor, Penicillins, and Strawberry extract  Review of Systems   Review of Systems  Constitutional:  Negative for chills and fever.  HENT:  Negative for ear pain and sore throat.   Eyes:  Negative for pain and visual disturbance.  Respiratory:  Negative for cough and shortness of breath.   Cardiovascular:  Negative for chest pain and palpitations.  Gastrointestinal:  Negative for abdominal pain and vomiting.  Genitourinary:  Negative for dysuria, flank pain, hematuria and pelvic pain.  Musculoskeletal:  Positive for myalgias. Negative for arthralgias, back pain, joint swelling and neck stiffness.  Skin:  Positive for wound. Negative for color change and rash.  Neurological:  Positive for weakness (Generalized). Negative for dizziness, seizures, syncope,  speech difficulty, numbness and headaches.  Hematological:  Does not bruise/bleed easily.  Psychiatric/Behavioral:  Negative for confusion and decreased concentration.   All other systems reviewed and are negative.  Physical Exam Updated Vital Signs BP (!) 153/112 (BP Location: Right Arm)    Pulse (!) 109    Temp 98.5 F (36.9 C) (Oral)    Resp (!) 24    Ht 5\' 4"  (1.626 m)    Wt (!) 181.4 kg    LMP 12/08/2012    SpO2 96%    BMI 68.66 kg/m   Physical Exam Vitals and nursing note reviewed.  Constitutional:      General: She is not in acute distress.    Appearance: She is well-developed.  She is obese. She is not toxic-appearing or diaphoretic.  HENT:     Head: Normocephalic and atraumatic.     Right Ear: External ear normal.     Left Ear: External ear normal.     Nose: Nose normal.     Mouth/Throat:     Mouth: Mucous membranes are dry.     Pharynx: Oropharynx is clear.  Eyes:     General: No scleral icterus.    Extraocular Movements: Extraocular movements intact.     Conjunctiva/sclera: Conjunctivae normal.  Cardiovascular:     Rate and Rhythm: Regular rhythm. Tachycardia present.     Heart sounds: No murmur heard. Pulmonary:     Effort: Pulmonary effort is normal. No respiratory distress.  Abdominal:     Palpations: Abdomen is soft.     Tenderness: There is no abdominal tenderness.  Musculoskeletal:        General: No swelling or deformity. Normal range of motion.     Cervical back: Normal range of motion and neck supple. No rigidity.     Right lower leg: No edema.     Left lower leg: No edema.  Skin:    General: Skin is warm and dry.     Capillary Refill: Capillary refill takes less than 2 seconds.     Coloration: Skin is not jaundiced or pale.     Comments: Scattered area of skin breakdown in intertriginous areas.  No obvious yeast infections.  Thickened skin in area of pannus.  Neurological:     General: No focal deficit present.     Mental Status: She is alert and  oriented to person, place, and time.     Cranial Nerves: No cranial nerve deficit.     Sensory: No sensory deficit.     Motor: No weakness.     Coordination: Coordination normal.  Psychiatric:        Mood and Affect: Mood is anxious.        Speech: Speech normal.        Behavior: Behavior normal. Behavior is cooperative.    ED Results / Procedures / Treatments   Labs (all labs ordered are listed, but only abnormal results are displayed) Labs Reviewed  CBC WITH DIFFERENTIAL/PLATELET - Abnormal; Notable for the following components:      Result Value   WBC 11.7 (*)    Neutro Abs 8.7 (*)    Monocytes Absolute 1.5 (*)    Abs Immature Granulocytes 0.13 (*)    All other components within normal limits  COMPREHENSIVE METABOLIC PANEL - Abnormal; Notable for the following components:   Sodium 129 (*)    Chloride 97 (*)    CO2 21 (*)    Glucose, Bld 324 (*)    Creatinine, Ser 1.39 (*)    Calcium 8.7 (*)    Albumin 3.0 (*)    GFR, Estimated 45 (*)    All other components within normal limits  MAGNESIUM - Abnormal; Notable for the following components:   Magnesium 1.2 (*)    All other components within normal limits  CK - Abnormal; Notable for the following components:   Total CK 288 (*)    All other components within normal limits  CBG MONITORING, ED - Abnormal; Notable for the following components:   Glucose-Capillary 316 (*)    All other components within normal limits  RESP PANEL BY RT-PCR (FLU A&B, COVID) ARPGX2  URINALYSIS, ROUTINE W REFLEX MICROSCOPIC  RAPID URINE DRUG SCREEN, HOSP PERFORMED  I-STAT  BETA HCG BLOOD, ED ([facility], [facility], [facility] ONLY)    EKG None  Radiology No results found.  Procedures Procedures   Medications Ordered in ED Medications  lactated ringers bolus 1,000 mL (0 mLs Intravenous Stopped 04/05/21 0054)  magnesium sulfate IVPB 2 g 50 mL (0 g Intravenous Stopped 04/04/21 2306)  diazepam (VALIUM) injection 5 mg (5 mg Intravenous Given 04/04/21 2206)   lactated ringers bolus 1,000 mL (1,000 mLs Intravenous New Bag/Given 04/05/21 0100)    ED Course  I have reviewed the triage vital signs and the nursing notes.  Pertinent labs & imaging results that were available during my care of the patient were reviewed by me and considered in my medical decision making (see chart for details).    MDM Rules/Calculators/A&P                         55 year old female presenting by EMS from home due to inability to get up off of the toilet.  She states that she was unable to due to painful spasms in her legs and generalized weakness.  She was recently admitted for pannus cellulitis.  It does appear that she did not take her prescribed Keflex for this following discharge.  Vital signs on arrival are notable for tachycardia.  Patient is afebrile.  Physical exam is limited by her habitus.  She does not appear to have any areas of focal tenderness.  She does portions of minor skin breakdown within intertriginous areas.  Midline skin underlying pannus remains thickened.  She states that she sustained a wound on her backside from the toilet seat but this is not appreciable on exam.  Oral mucosa does appear dry.  IV fluid bolus was ordered.  Diagnostic work-up was initiated.  Valium was ordered for symptomatic relief of muscle spasms.  Lab work showed hypomagnesemia.  IV magnesium was given in the ED.  Other lab work showed a mild leukocytosis of 11.7.  This is increased from her previous hospitalization.  CK is mildly elevated but this has down trended since she was last seen in the hospital.  Sugar slightly elevated at 324 without evidence of DKA.  Creatinine has improved significantly since her recent admission.  At time of signout, urinalysis is pending acquisition of a urine sample.  Patient was given additional IV fluids.  Care of patient was signed out to oncoming ED provider.  Final Clinical Impression(s) / ED Diagnoses Final diagnoses:  Generalized weakness   Muscle cramps    Rx / DC Orders ED Discharge Orders     None        Godfrey Pick, MD 04/05/21 3299    Godfrey Pick, MD 04/05/21 0225

## 2021-04-04 NOTE — ED Triage Notes (Signed)
BIB EMS from home after sitting on the commode for an hour and a half because her BIL LE were too painful and spasming.

## 2021-04-04 NOTE — ED Provider Notes (Signed)
°  Provider Note MRN:  [id number]  Arrival date & time: 04/05/21    ED Course and Medical Decision Making  Assumed care from Dr. Doren Custard at shift change.  Patient could not get herself off of the toilet at home, recent admission for cellulitis of the pannus, states that she did not take the antibiotics prescribed.  Per recent discharge summary the plan was to set her up with home health but she does not have any help currently.  Awaiting labs, urinalysis and will reassess.  Will consider admission versus TOC evaluation in the morning.  On reassessment at 5 AM, patient is feeling well.  Her work-up is reassuring, no indication for admission, largely without symptoms at this time, no indication for further testing.  She explains that she feels safe at home and would like to be discharged.  She is in talks with a case manager regarding home health assistance.  Procedures  Final Clinical Impressions(s) / ED Diagnoses     ICD-10-CM   1. Generalized weakness  R53.1     2. Muscle cramps  R25.2       ED Discharge Orders     None         Discharge Instructions      You were evaluated in the Emergency Department and after careful evaluation, we did not find any emergent condition requiring admission or further testing in the hospital.  Your exam/testing today was overall reassuring.  Please return to the Emergency Department if you experience any worsening of your condition.  Thank you for allowing [place] to be a part of your care.       Barth Kirks. [place]onia Small, [place] [email or web address]    Maudie Flakes, MD 04/05/21 [phone]

## 2021-04-05 LAB — URINALYSIS, ROUTINE W REFLEX MICROSCOPIC
Bacteria, UA: NONE SEEN
Bilirubin Urine: NEGATIVE
Glucose, UA: 50 mg/dL — AB
Ketones, ur: NEGATIVE mg/dL
Leukocytes,Ua: NEGATIVE
Nitrite: NEGATIVE
Protein, ur: >=300 mg/dL — AB
Specific Gravity, Urine: 1.013 (ref 1.005–1.030)
pH: 6 (ref 5.0–8.0)

## 2021-04-05 LAB — RAPID URINE DRUG SCREEN, HOSP PERFORMED
Amphetamines: NOT DETECTED
Barbiturates: NOT DETECTED
Benzodiazepines: NOT DETECTED
Cocaine: NOT DETECTED
Opiates: NOT DETECTED
Tetrahydrocannabinol: NOT DETECTED

## 2021-04-05 NOTE — Discharge Instructions (Signed)
You were evaluated in the Emergency Department and after careful evaluation, we did not find any emergent condition requiring admission or further testing in the hospital.  Your exam/testing today was overall reassuring.  Please return to the Emergency Department if you experience any worsening of your condition.  Thank you for allowing us to be a part of your care.  

## 2021-04-12 ENCOUNTER — Observation Stay (HOSPITAL_COMMUNITY)
Admission: EM | Admit: 2021-04-12 | Discharge: 2021-04-19 | Disposition: A | Discharge status: Skilled Nursing Facility | Payer: Medicare Other | Attending: Internal Medicine | Admitting: Internal Medicine

## 2021-04-12 ENCOUNTER — Encounter (HOSPITAL_COMMUNITY): Payer: Self-pay | Admitting: Emergency Medicine

## 2021-04-12 ENCOUNTER — Emergency Department (HOSPITAL_COMMUNITY): Payer: Medicare Other

## 2021-04-12 ENCOUNTER — Other Ambulatory Visit: Payer: Self-pay

## 2021-04-12 DIAGNOSIS — Z7984 Long term (current) use of oral hypoglycemic drugs: Secondary | ICD-10-CM | POA: Diagnosis not present

## 2021-04-12 DIAGNOSIS — N839 Noninflammatory disorder of ovary, fallopian tube and broad ligament, unspecified: Secondary | ICD-10-CM | POA: Insufficient documentation

## 2021-04-12 DIAGNOSIS — R9089 Other abnormal findings on diagnostic imaging of central nervous system: Secondary | ICD-10-CM

## 2021-04-12 DIAGNOSIS — Z6841 Body Mass Index (BMI) 40.0 and over, adult: Secondary | ICD-10-CM | POA: Diagnosis not present

## 2021-04-12 DIAGNOSIS — N1832 Chronic kidney disease, stage 3b: Secondary | ICD-10-CM | POA: Insufficient documentation

## 2021-04-12 DIAGNOSIS — E119 Type 2 diabetes mellitus without complications: Secondary | ICD-10-CM

## 2021-04-12 DIAGNOSIS — E1169 Type 2 diabetes mellitus with other specified complication: Secondary | ICD-10-CM

## 2021-04-12 DIAGNOSIS — F17210 Nicotine dependence, cigarettes, uncomplicated: Secondary | ICD-10-CM | POA: Insufficient documentation

## 2021-04-12 DIAGNOSIS — L8992 Pressure ulcer of unspecified site, stage 2: Secondary | ICD-10-CM

## 2021-04-12 DIAGNOSIS — I152 Hypertension secondary to endocrine disorders: Secondary | ICD-10-CM | POA: Diagnosis present

## 2021-04-12 DIAGNOSIS — Z79899 Other long term (current) drug therapy: Secondary | ICD-10-CM | POA: Insufficient documentation

## 2021-04-12 DIAGNOSIS — N179 Acute kidney failure, unspecified: Secondary | ICD-10-CM

## 2021-04-12 DIAGNOSIS — I129 Hypertensive chronic kidney disease with stage 1 through stage 4 chronic kidney disease, or unspecified chronic kidney disease: Secondary | ICD-10-CM | POA: Insufficient documentation

## 2021-04-12 DIAGNOSIS — E11649 Type 2 diabetes mellitus with hypoglycemia without coma: Secondary | ICD-10-CM

## 2021-04-12 DIAGNOSIS — E871 Hypo-osmolality and hyponatremia: Principal | ICD-10-CM | POA: Insufficient documentation

## 2021-04-12 DIAGNOSIS — N838 Other noninflammatory disorders of ovary, fallopian tube and broad ligament: Secondary | ICD-10-CM | POA: Diagnosis present

## 2021-04-12 DIAGNOSIS — E039 Hypothyroidism, unspecified: Secondary | ICD-10-CM | POA: Diagnosis not present

## 2021-04-12 DIAGNOSIS — Z20822 Contact with and (suspected) exposure to covid-19: Secondary | ICD-10-CM | POA: Diagnosis not present

## 2021-04-12 DIAGNOSIS — L03311 Cellulitis of abdominal wall: Secondary | ICD-10-CM

## 2021-04-12 DIAGNOSIS — E1122 Type 2 diabetes mellitus with diabetic chronic kidney disease: Secondary | ICD-10-CM | POA: Diagnosis not present

## 2021-04-12 DIAGNOSIS — R8290 Unspecified abnormal findings in urine: Secondary | ICD-10-CM

## 2021-04-12 DIAGNOSIS — Z794 Long term (current) use of insulin: Secondary | ICD-10-CM | POA: Insufficient documentation

## 2021-04-12 DIAGNOSIS — R29898 Other symptoms and signs involving the musculoskeletal system: Secondary | ICD-10-CM

## 2021-04-12 DIAGNOSIS — Z720 Tobacco use: Secondary | ICD-10-CM

## 2021-04-12 DIAGNOSIS — E785 Hyperlipidemia, unspecified: Secondary | ICD-10-CM

## 2021-04-12 DIAGNOSIS — R531 Weakness: Secondary | ICD-10-CM | POA: Diagnosis present

## 2021-04-12 DIAGNOSIS — L304 Erythema intertrigo: Secondary | ICD-10-CM | POA: Diagnosis not present

## 2021-04-12 DIAGNOSIS — L8990 Pressure ulcer of unspecified site, unspecified stage: Secondary | ICD-10-CM | POA: Insufficient documentation

## 2021-04-12 LAB — COMPREHENSIVE METABOLIC PANEL
ALT: 10 U/L (ref 0–44)
AST: 22 U/L (ref 15–41)
Albumin: 3.1 g/dL — ABNORMAL LOW (ref 3.5–5.0)
Alkaline Phosphatase: 72 U/L (ref 38–126)
Anion gap: 9 (ref 5–15)
BUN: 18 mg/dL (ref 6–20)
CO2: 22 mmol/L (ref 22–32)
Calcium: 8.9 mg/dL (ref 8.9–10.3)
Chloride: 94 mmol/L — ABNORMAL LOW (ref 98–111)
Creatinine, Ser: 1.42 mg/dL — ABNORMAL HIGH (ref 0.44–1.00)
GFR, Estimated: 44 mL/min — ABNORMAL LOW (ref >60)
Glucose, Bld: 286 mg/dL — ABNORMAL HIGH (ref 70–99)
Potassium: 4.6 mmol/L (ref 3.5–5.1)
Sodium: 125 mmol/L — ABNORMAL LOW (ref 135–145)
Total Bilirubin: 0.6 mg/dL (ref 0.3–1.2)
Total Protein: 8.1 g/dL (ref 6.5–8.1)

## 2021-04-12 LAB — I-STAT CHEM 8, ED
BUN: 16 mg/dL (ref 6–20)
Calcium, Ion: 1.14 mmol/L — ABNORMAL LOW (ref 1.15–1.40)
Chloride: 94 mmol/L — ABNORMAL LOW (ref 98–111)
Creatinine, Ser: 1.3 mg/dL — ABNORMAL HIGH (ref 0.44–1.00)
Glucose, Bld: 281 mg/dL — ABNORMAL HIGH (ref 70–99)
HCT: 45 % (ref 36.0–46.0)
Hemoglobin: 15.3 g/dL — ABNORMAL HIGH (ref 12.0–15.0)
Potassium: 4.6 mmol/L (ref 3.5–5.1)
Sodium: 127 mmol/L — ABNORMAL LOW (ref 135–145)
TCO2: 22 mmol/L (ref 22–32)

## 2021-04-12 LAB — RESP PANEL BY RT-PCR (FLU A&B, COVID) ARPGX2
Influenza A by PCR: NEGATIVE
Influenza B by PCR: NEGATIVE
SARS Coronavirus 2 by RT PCR: NEGATIVE

## 2021-04-12 LAB — DIFFERENTIAL
Abs Immature Granulocytes: 0.18 10*3/uL — ABNORMAL HIGH (ref 0.00–0.07)
Basophils Absolute: 0 10*3/uL (ref 0.0–0.1)
Basophils Relative: 0 %
Eosinophils Absolute: 0 10*3/uL (ref 0.0–0.5)
Eosinophils Relative: 0 %
Immature Granulocytes: 1 %
Lymphocytes Relative: 12 %
Lymphs Abs: 1.6 10*3/uL (ref 0.7–4.0)
Monocytes Absolute: 1.5 10*3/uL — ABNORMAL HIGH (ref 0.1–1.0)
Monocytes Relative: 11 %
Neutro Abs: 9.9 10*3/uL — ABNORMAL HIGH (ref 1.7–7.7)
Neutrophils Relative %: 76 %

## 2021-04-12 LAB — I-STAT BETA HCG BLOOD, ED (MC, WL, AP ONLY): I-stat hCG, quantitative: <5 m[IU]/mL (ref <5)

## 2021-04-12 LAB — CBC
HCT: 40.8 % (ref 36.0–46.0)
Hemoglobin: 13.3 g/dL (ref 12.0–15.0)
MCH: 27.3 pg (ref 26.0–34.0)
MCHC: 32.6 g/dL (ref 30.0–36.0)
MCV: 83.6 fL (ref 80.0–100.0)
Platelets: 282 10*3/uL (ref 150–400)
RBC: 4.88 MIL/uL (ref 3.87–5.11)
RDW: 14.1 % (ref 11.5–15.5)
WBC: 13.2 10*3/uL — ABNORMAL HIGH (ref 4.0–10.5)
nRBC: 0 % (ref 0.0–0.2)

## 2021-04-12 LAB — APTT: aPTT: 32 seconds (ref 24–36)

## 2021-04-12 LAB — LACTIC ACID, PLASMA
Lactic Acid, Venous: 1.5 mmol/L (ref 0.5–1.9)
Lactic Acid, Venous: 1.6 mmol/L (ref 0.5–1.9)

## 2021-04-12 LAB — PROTIME-INR
INR: 1.2 (ref 0.8–1.2)
Prothrombin Time: 15 seconds (ref 11.4–15.2)

## 2021-04-12 LAB — ETHANOL: Alcohol, Ethyl (B): <10 mg/dL (ref <10)

## 2021-04-12 LAB — AMMONIA: Ammonia: 20 umol/L (ref 9–35)

## 2021-04-12 IMAGING — MR MR HEAD W/O CM
12 series · 48 of 48 positions shown · non-contrast
Comparison: Head CT from earlier the same day.

CLINICAL DATA: Initial evaluation for neuro deficit, stroke
suspected.

EXAM:
MRI HEAD WITHOUT CONTRAST
TECHNIQUE: Multiplanar, multiecho pulse sequences of the brain and surrounding
structures were obtained without intravenous contrast.

[Series 5: DWI · axial · 3.0mm · 1.36mm/px · z∈[-55,+76]mm · 6 of 96 slices shown (1 of 4)]
[im 1/96]
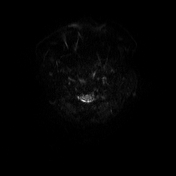
[im 20/96]
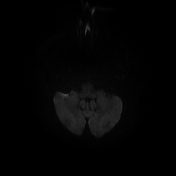
[im 39/96]
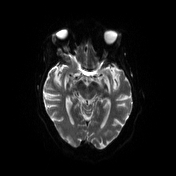
[im 58/96]
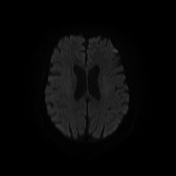
[im 77/96]
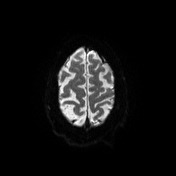
[im 96/96]
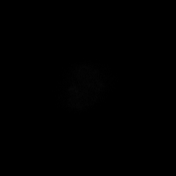

[Series 6: DWI · axial · 3.0mm · 1.36mm/px · z∈[-55,+76]mm · 4 of 48 slices shown (2 of 4)]
[im 1/48]
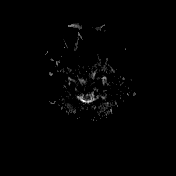
[im 16/48]
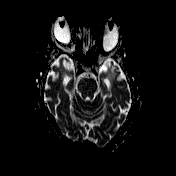
[im 32/48]
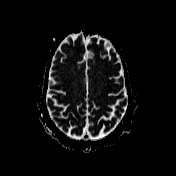
[im 48/48]
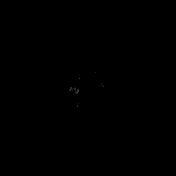

[Series 7: T1 · sagittal · 5.0mm · 0.75mm/px · 2 of 24 slices shown (1 of 3)]
[im 1/24]
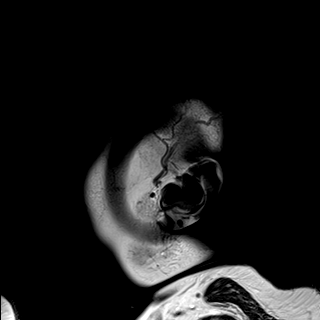
[im 24/24]
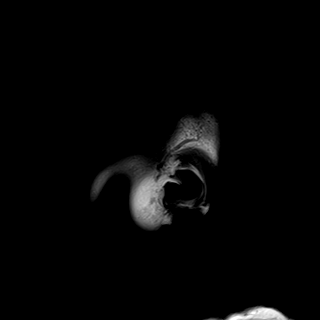

[Series 8: T2 · axial · 5.0mm · 0.62mm/px · z∈[-48,+85]mm · 2 of 23 slices shown (1 of 2)]
[im 1/23]
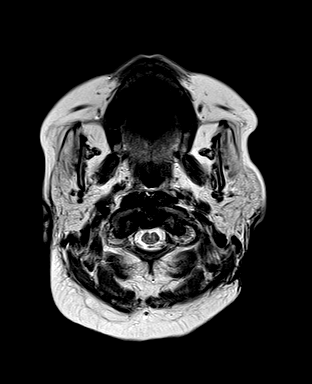
[im 23/23]
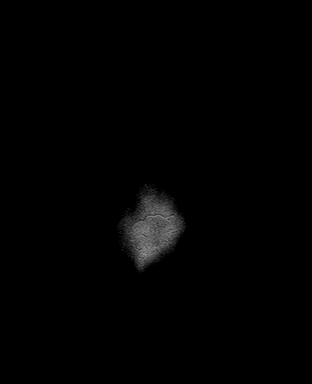

[Series 9: swi_images · axial · 3.0mm · 0.75mm/px · z∈[-81,+117]mm · 5 of 72 slices shown]
[im 1/72]
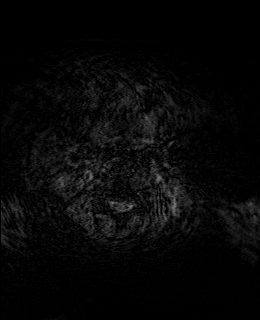
[im 18/72]
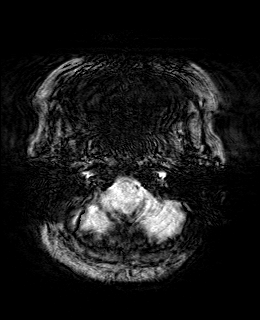
[im 36/72]
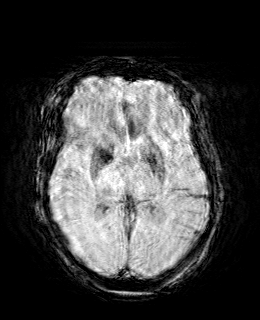
[im 54/72]
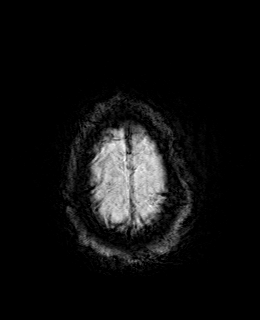
[im 72/72]
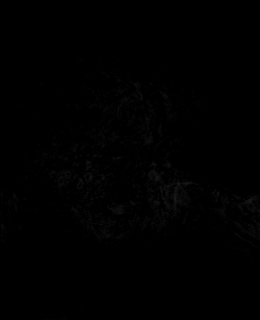

[Series 11: FLAIR · axial · 3.0mm · 0.75mm/px · z∈[-53,+89]mm · 4 of 52 slices shown (1 of 2)]
[im 1/52]
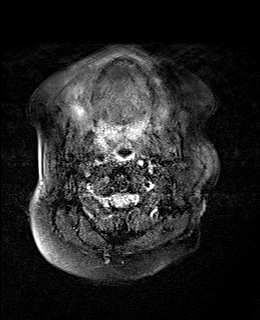
[im 18/52]
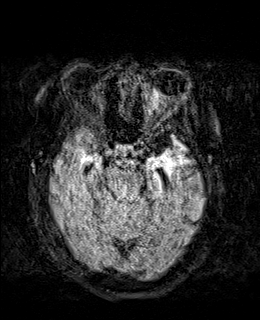
[im 35/52]
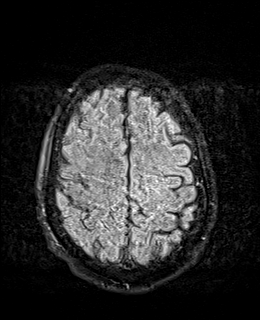
[im 52/52]
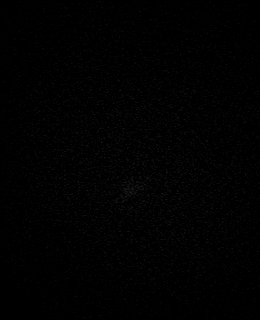

[Series 12: T1 · axial · 1.0mm · 0.94mm/px · z∈[-48,+85]mm · 11 of 144 slices shown (2 of 3)]
[im 1/144]
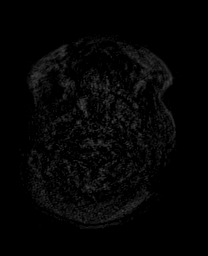
[im 15/144]
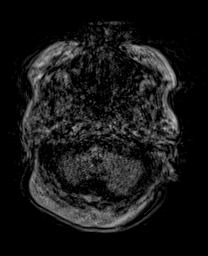
[im 29/144]
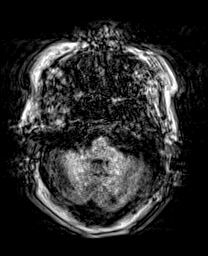
[im 43/144]
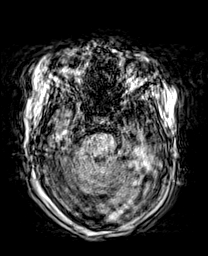
[im 58/144]
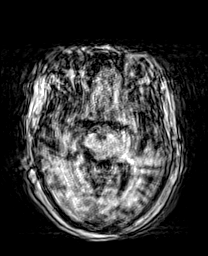
[im 72/144]
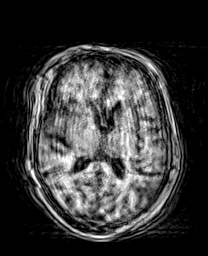
[im 86/144]
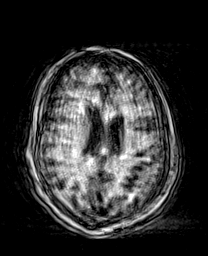
[im 101/144]
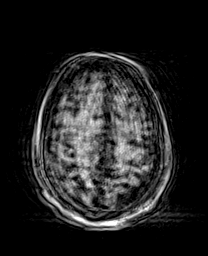
[im 115/144]
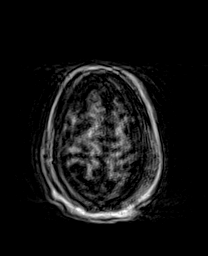
[im 129/144]
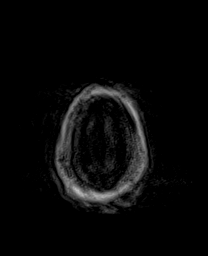
[im 144/144]
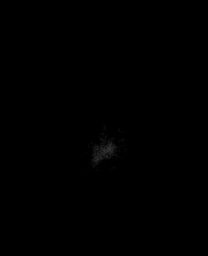

[Series 13: DWI · coronal · 5.0mm · 1.31mm/px · 4 of 52 slices shown (3 of 4)]
[im 1/52]
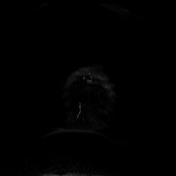
[im 18/52]
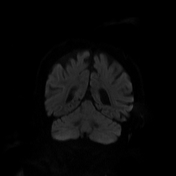
[im 35/52]
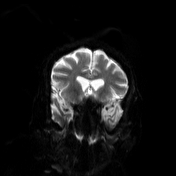
[im 52/52]
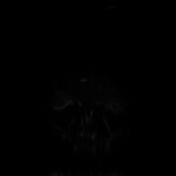

[Series 14: DWI · coronal · 5.0mm · 1.31mm/px · 2 of 26 slices shown (4 of 4)]
[im 1/26]
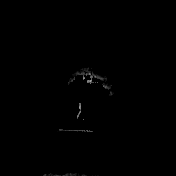
[im 26/26]
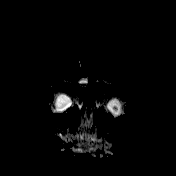

[Series 15: T2 · coronal · 5.0mm · 0.86mm/px · 2 of 33 slices shown (2 of 2)]
[im 1/33]
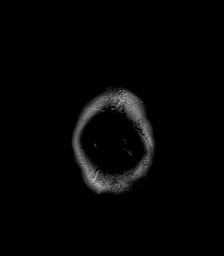
[im 33/33]
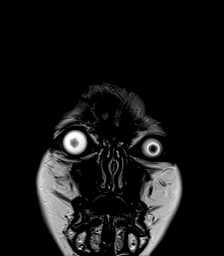

[Series 16: FLAIR · axial · 3.0mm · 0.86mm/px · z∈[-55,+88]mm · 4 of 52 slices shown (2 of 2)]
[im 1/52]
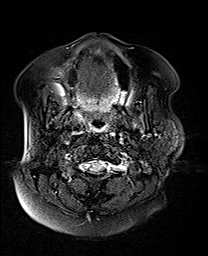
[im 18/52]
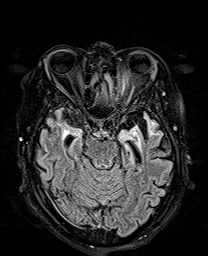
[im 35/52]
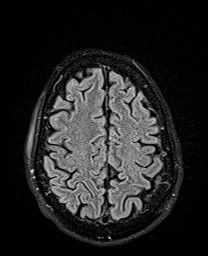
[im 52/52]
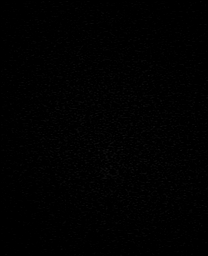

[Series 17: T1 · axial · 5.0mm · 0.94mm/px · z∈[-43,+86]mm · 2 of 24 slices shown (3 of 3)]
[im 1/24]
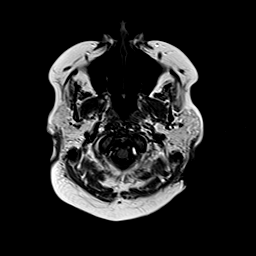
[im 24/24]
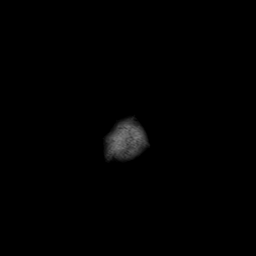

[48 of 48 positions shown; findings below may reference images not displayed]

FINDINGS: Brain: Examination moderately degraded by motion artifact.

Diffuse prominence of the CSF containing spaces compatible with
cerebral atrophy, advanced for age. Changes are most pronounced at
the anterior temporal poles bilaterally where there is fairly
pronounced atrophy/encephalomalacia (series 8, image 9). Associated
T2/FLAIR hyperintensity seen within this region. Finding is
nonspecific, and could possibly related to prior trauma. Changes of
CADASIL or possibly chronic microvascular ischemic disease could
also be considered.

No abnormal foci of restricted diffusion to suggest acute or
subacute ischemia. Gray-white matter differentiation otherwise
maintained. No other areas of chronic cortical infarction. No
visible evidence for acute or chronic intracranial hemorrhage.

No mass lesion, mass effect or midline shift. No hydrocephalus or
extra-axial fluid collection. Pituitary gland suprasellar region
within normal limits. Midline structures intact.

Vascular: Right vertebral artery hypoplastic and not well seen.
Major intracranial vascular flow voids are otherwise maintained.

Skull and upper cervical spine: Craniocervical junction within
normal limits. Bone marrow signal intensity diffusely decreased on
T1 weighted sequence, nonspecific, but most commonly related to
anemia, smoking or obesity. No focal marrow replacing lesion. No
scalp soft tissue abnormality.

Sinuses/Orbits: Globes orbital soft tissues demonstrate no acute
finding. Paranasal sinuses are largely clear. No significant mastoid
effusion.

Other: None.
IMPRESSION: 1. No acute intracranial abnormality.
2. Advanced cerebral atrophy for age, with fairly pronounced
atrophy/encephalomalacia involving the anterior temporal poles
bilaterally. Finding is nonspecific, and could possibly related to
prior trauma or ischemia. Changes of CADASIL could also be
considered.

## 2021-04-12 IMAGING — MR MR CERVICAL SPINE W/O CM
5 of 6 series · 34 of 48 positions shown · non-contrast
Comparison: None available.

CLINICAL DATA: Initial evaluation for acute myelopathy.

EXAM:
MRI CERVICAL SPINE WITHOUT CONTRAST
TECHNIQUE: Multiplanar, multisequence MR imaging of the cervical spine was
performed. No intravenous contrast was administered.

[Series 5: T1 · sagittal · 3.0mm · 0.69mm/px · 6 of 15 slices shown (1 of 2)]
[im 1/15]
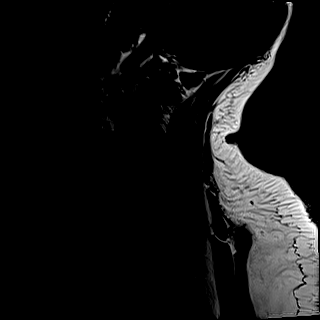
[im 3/15]
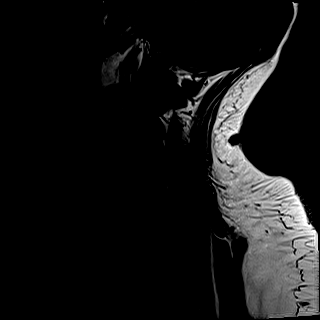
[im 6/15]
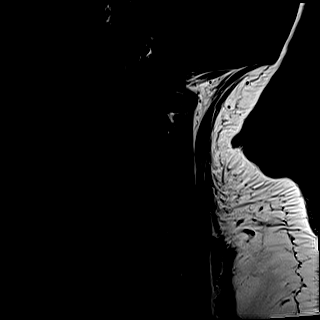
[im 9/15]
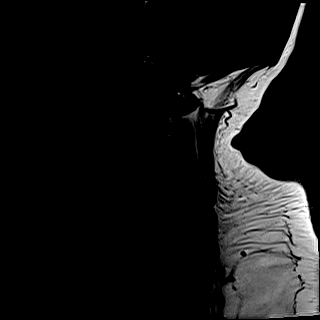
[im 12/15]
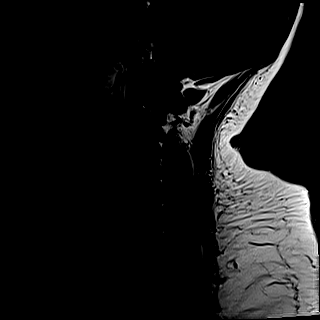
[im 15/15]
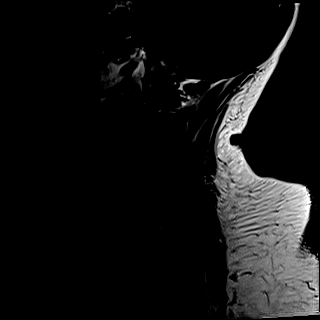

[Series 6: T2 · sagittal · 3.0mm · 0.69mm/px · 6 of 15 slices shown (1 of 2)]
[im 1/15]
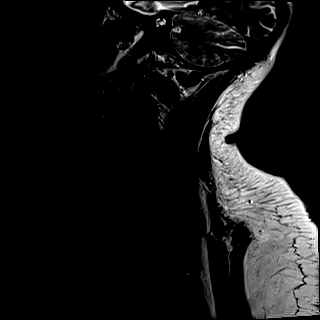
[im 3/15]
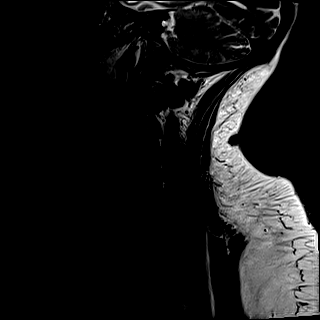
[im 6/15]
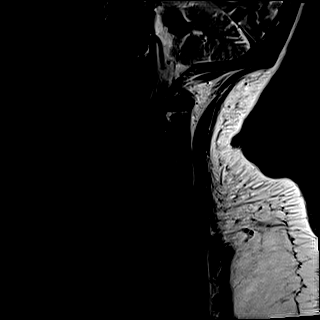
[im 9/15]
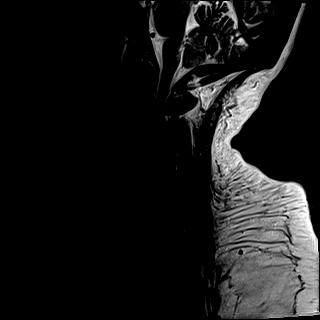
[im 12/15]
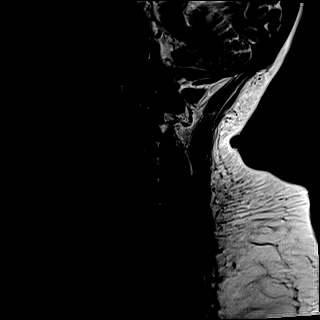
[im 15/15]
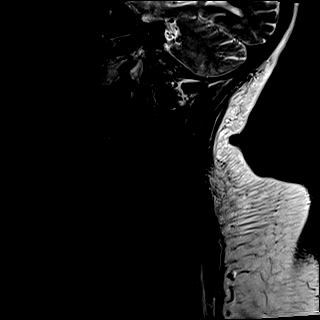

[Series 7: STIR · sagittal · 3.0mm · 0.86mm/px · 6 of 15 slices shown]
[im 1/15]
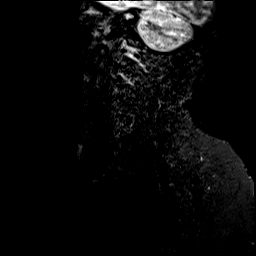
[im 3/15]
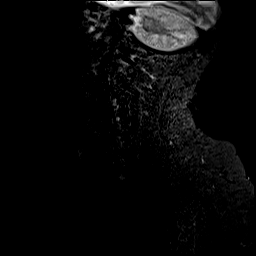
[im 6/15]
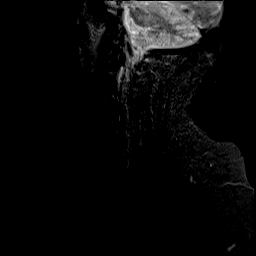
[im 9/15]
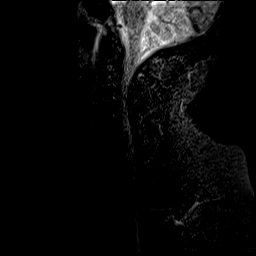
[im 12/15]
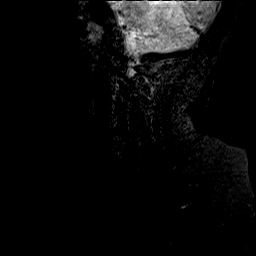
[im 15/15]
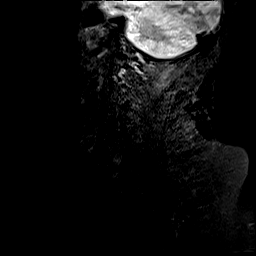

[Series 8: T2 · axial · 3.0mm · 0.70mm/px · z∈[-68,+23]mm · 8 of 27 slices shown (2 of 2)]
[im 1/27]
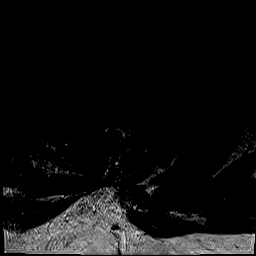
[im 3/27]
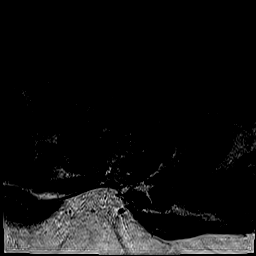
[im 9/27]
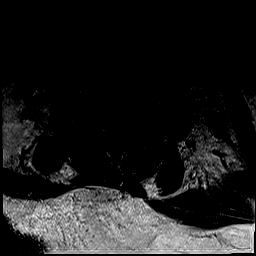
[im 12/27]
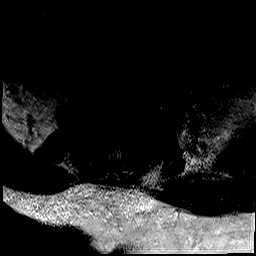
[im 15/27]
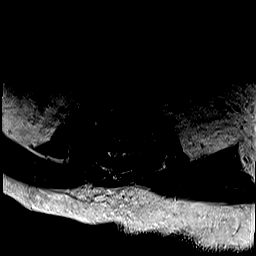
[im 18/27]
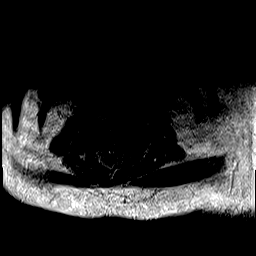
[im 24/27]
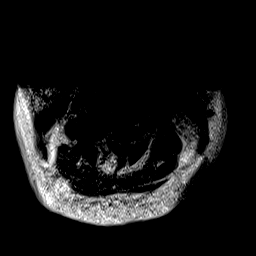
[im 27/27]
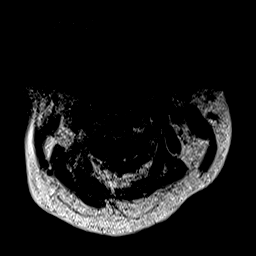

[Series 9: T1 · axial · 3.0mm · 0.35mm/px · z∈[-68,+23]mm · 8 of 27 slices shown (2 of 2)]
[im 1/27]
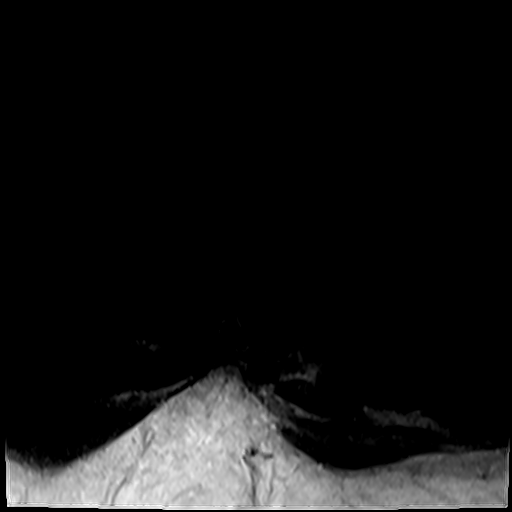
[im 3/27]
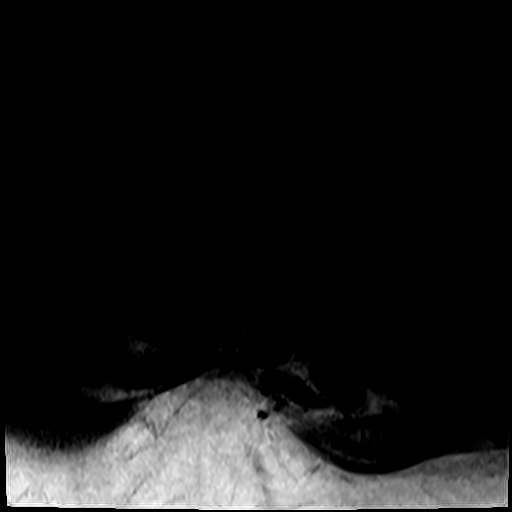
[im 9/27]
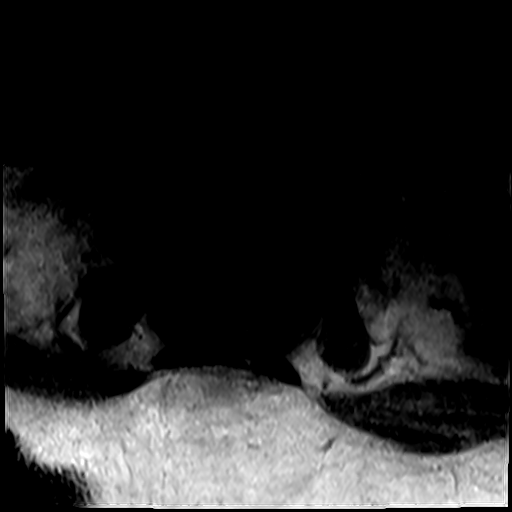
[im 12/27]
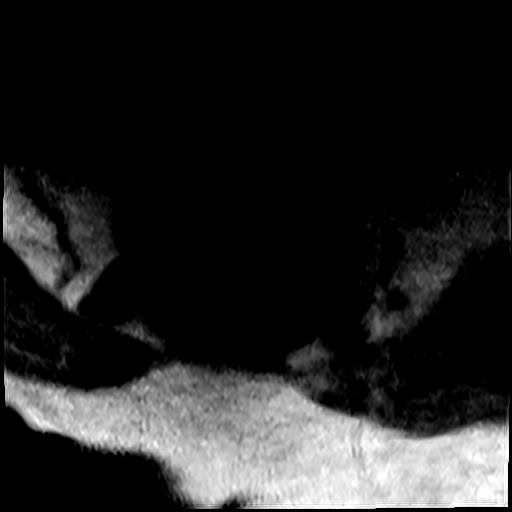
[im 15/27]
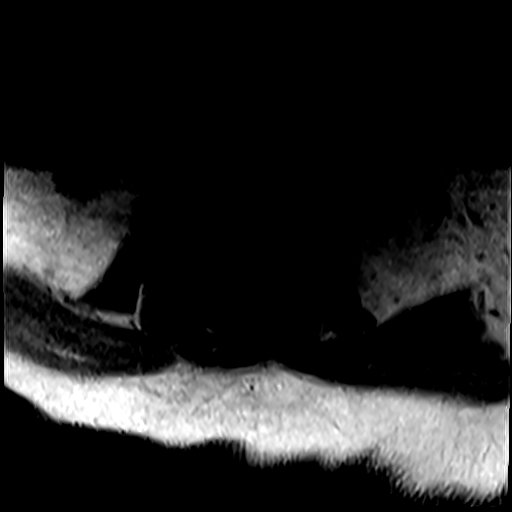
[im 18/27]
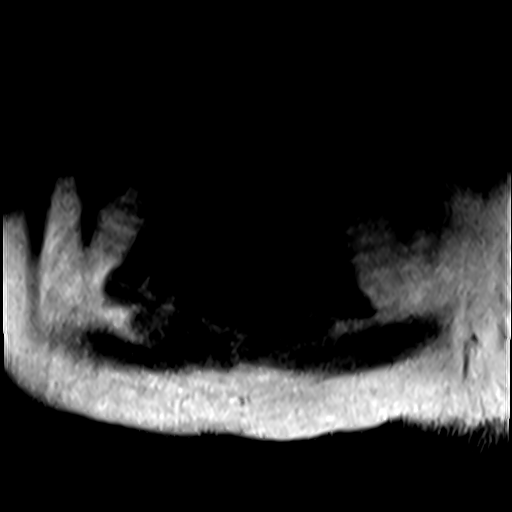
[im 24/27]
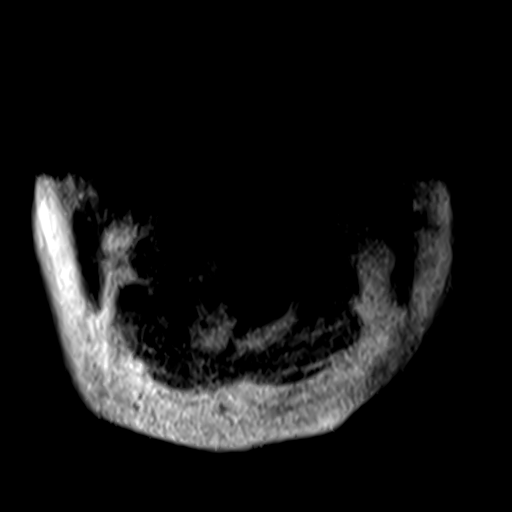
[im 27/27]
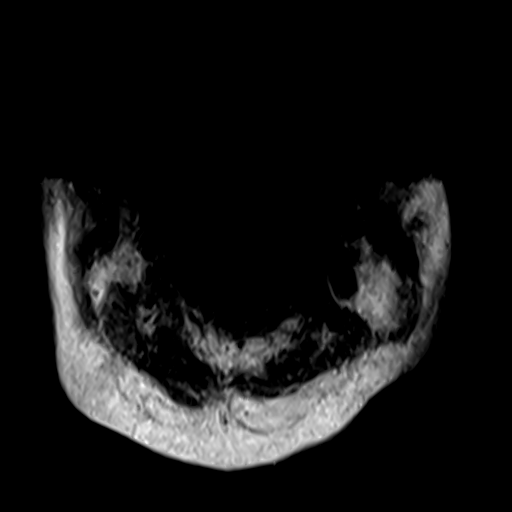

[34 of 48 positions shown; findings below may reference images not displayed]

FINDINGS: Alignment: Examination technically limited due to habitus and
extensive motion artifact, limiting assessment.

Straightening of the normal cervical lordosis. No significant
listhesis.

Vertebrae: Vertebral body height maintained without acute or chronic
fracture. Bone marrow signal intensity diffusely decreased on T1
weighted sequence, nonspecific, but most commonly related to anemia,
smoking or obesity. No worrisome osseous lesions. No visible
abnormal marrow edema.

Cord: Signal intensity within the cervical spinal cord is grossly
within normal limits. No obvious cord signal abnormality, although
evaluation limited by motion.

Posterior Fossa, vertebral arteries, paraspinal tissues: Paraspinous
soft tissues within normal limits. Normal flow voids seen within the
vertebral arteries bilaterally.

Disc levels:

C2-C3: Grossly negative.

C3-C4: Left paracentral to subarticular disc protrusion indents the
left ventral thecal sac (series 8, image 7). Mild flattening of the
left hemi cord without definite cord signal changes. Up to moderate
left-sided spinal stenosis. Foramina are grossly patent, although
evaluation limited by motion.

C4-C5: Mild disc bulge. No definite significant spinal stenosis.
Foramina grossly patent, although evaluation limited by motion.

C5-C6: Disc bulge with endplate and uncovertebral spurring.
Secondary flattening of the ventral thecal sac with no more than
mild spinal stenosis. No obvious cord impingement. Foramina are
grossly patent allowing for motion.

C6-C7: Disc bulge with endplate and uncovertebral spurring, slightly
asymmetric to the left. No more than mild spinal stenosis.
Evaluation of the foramina is severely limited by motion, although
there is suspected at least mild spinal stenosis on the left. Right
neural foramen is grossly patent.

C7-T1: Minimal disc bulge. No appreciable spinal stenosis. Foramina
are grossly patent.
IMPRESSION: 1. Technically limited exam due to habitus and extensive motion
artifact.
2. No definite acute abnormality within the cervical spine. No
obvious cord signal changes on this limited exam.
3. Left paracentral to subarticular disc protrusion at C3-4 with
resultant mild flattening of the left hemi cord and suspected
moderate left-sided spinal stenosis.
4. Left eccentric disc bulge with uncovertebral spurring at C6-7
with resultant at least mild spinal stenosis.
5. Additional mild noncompressive disc bulging at C4-5, C5-6, and
C7-T1 without appreciable stenosis. Foramina are grossly patent,
although evaluation is severely limited by motion artifact.

## 2021-04-12 IMAGING — MR MR LUMBAR SPINE W/O CM
5 series · 31 of 48 positions shown · non-contrast
Comparison: None available.

CLINICAL DATA: Initial evaluation for acute myelopathy.

EXAM:
MRI LUMBAR SPINE WITHOUT CONTRAST
TECHNIQUE: Multiplanar, multisequence MR imaging of the lumbar spine was
performed. No intravenous contrast was administered.

[Series 9: T2 · sagittal · 4.0mm · 0.81mm/px · 6 of 16 slices shown (1 of 2)]
[im 1/16]
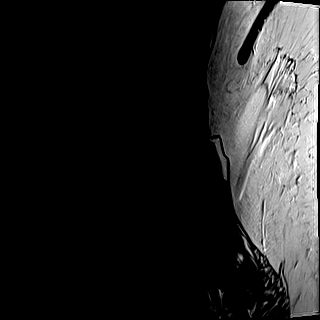
[im 4/16]
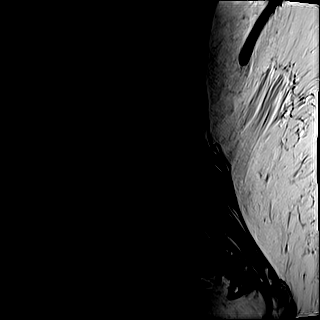
[im 7/16]
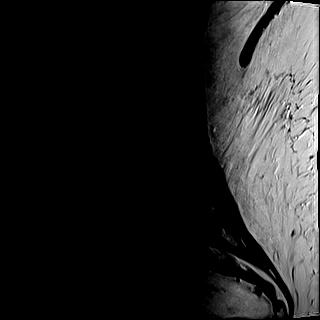
[im 10/16]
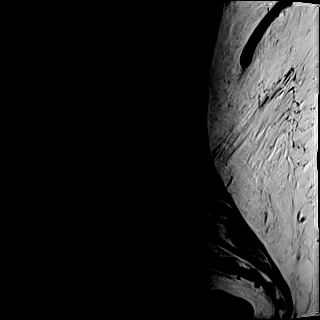
[im 13/16]
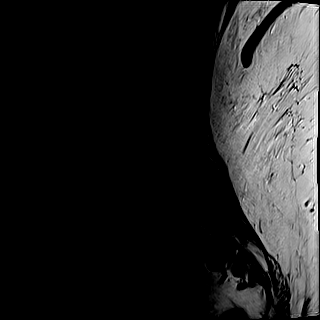
[im 16/16]
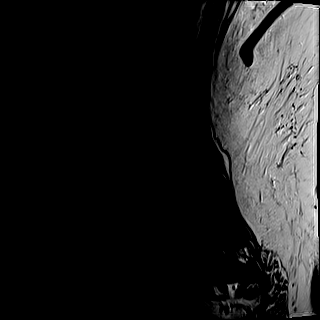

[Series 10: T1 · sagittal · 4.0mm · 0.81mm/px · 6 of 16 slices shown (1 of 2)]
[im 1/16]
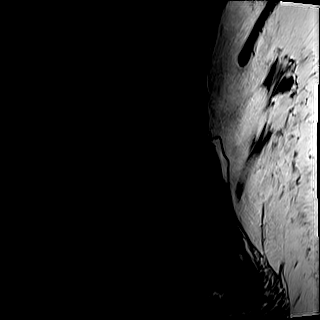
[im 4/16]
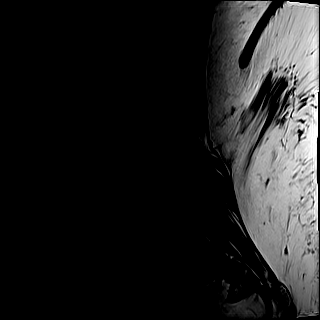
[im 7/16]
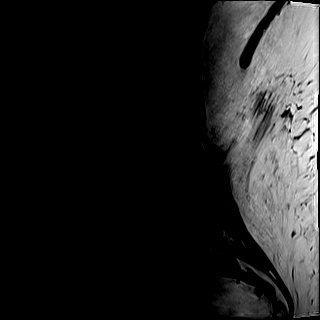
[im 10/16]
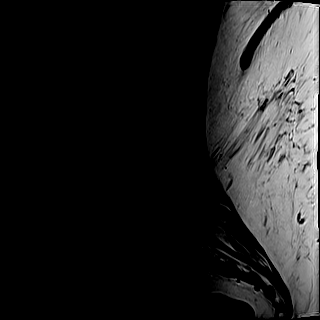
[im 13/16]
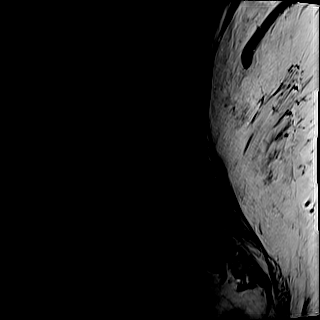
[im 16/16]
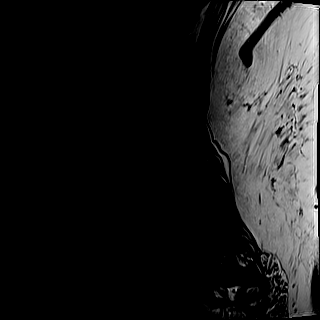

[Series 11: STIR · sagittal · 4.0mm · 0.51mm/px · 1 of 16 slices shown]
[im 1/16]
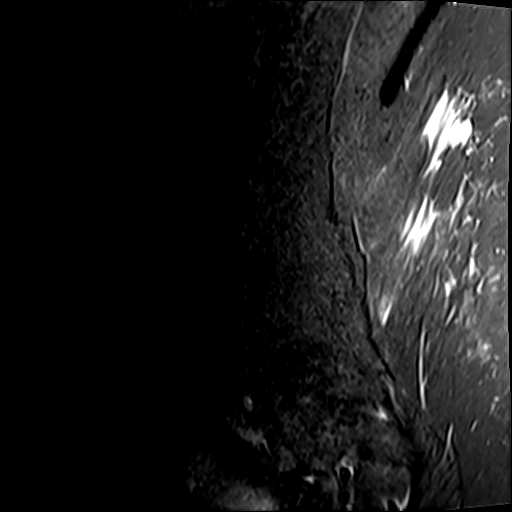

[Series 12: T2 · axial · 4.0mm · 0.78mm/px · z∈[-92,+127]mm · 9 of 43 slices shown (2 of 2)]
[im 1/43]
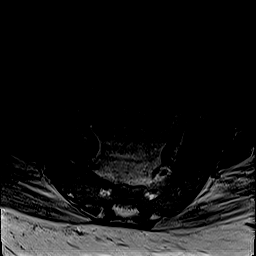
[im 7/43]
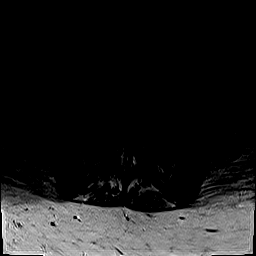
[im 13/43]
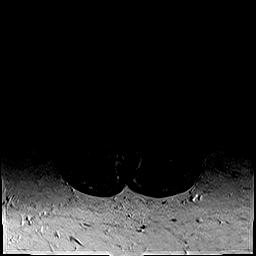
[im 19/43]
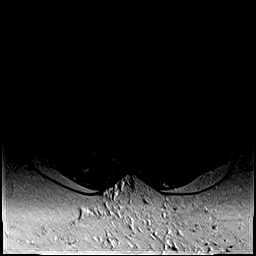
[im 22/43]
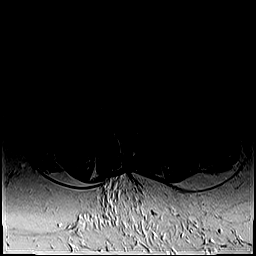
[im 25/43]
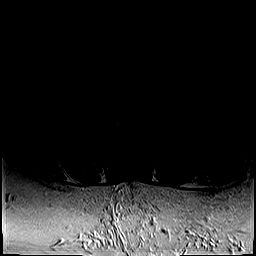
[im 31/43]
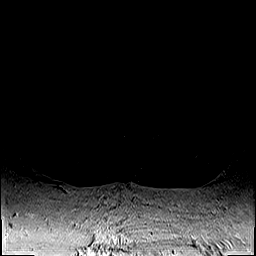
[im 37/43]
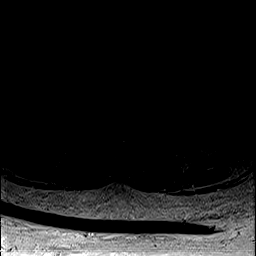
[im 43/43]
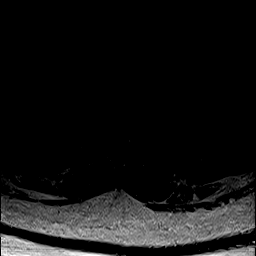

[Series 13: T1 · axial · 4.0mm · 0.39mm/px · z∈[-92,+127]mm · 9 of 43 slices shown (2 of 2)]
[im 1/43]
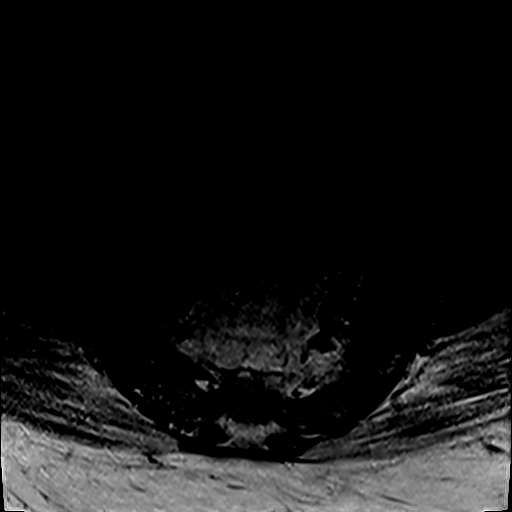
[im 7/43]
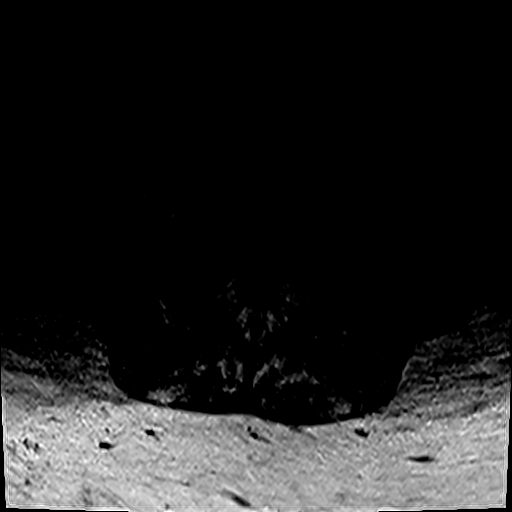
[im 13/43]
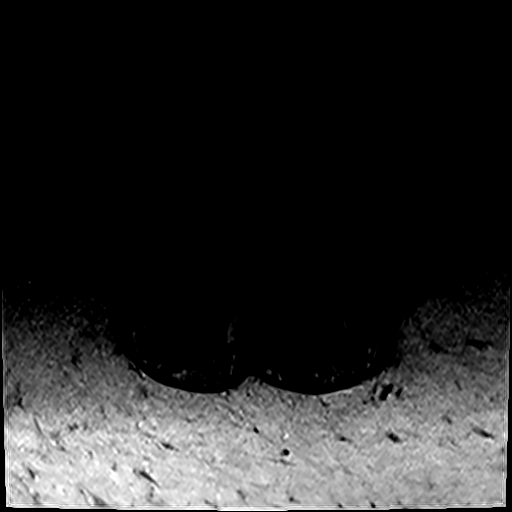
[im 19/43]
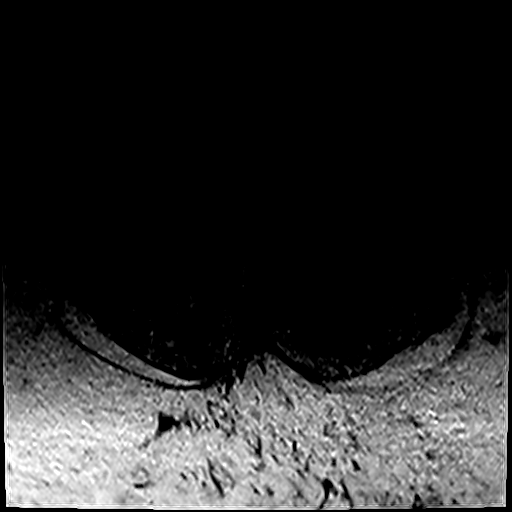
[im 22/43]
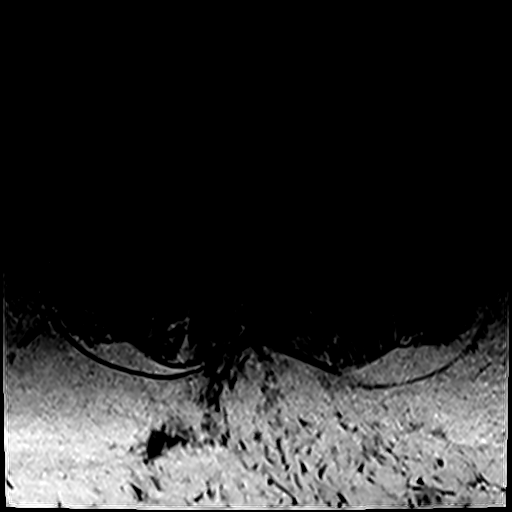
[im 25/43]
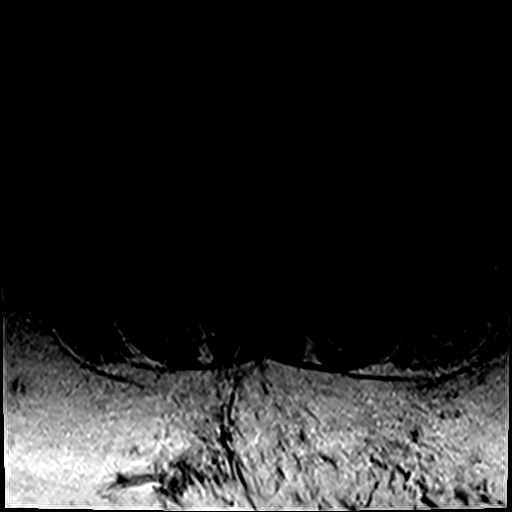
[im 31/43]
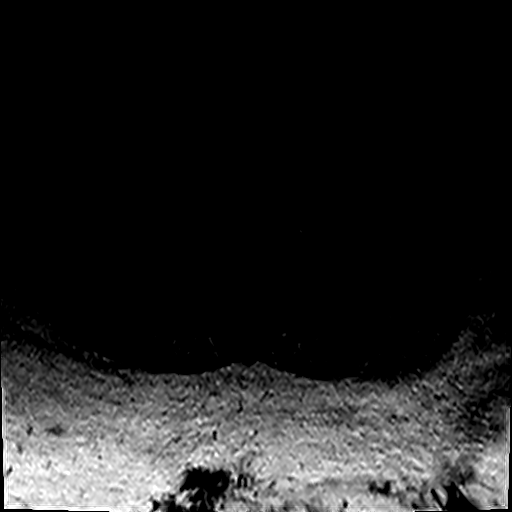
[im 37/43]
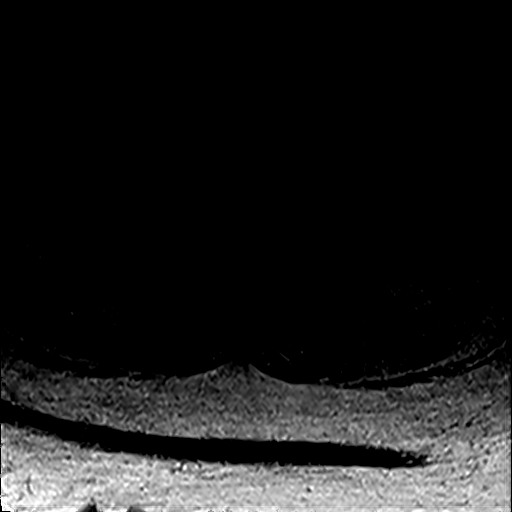
[im 43/43]
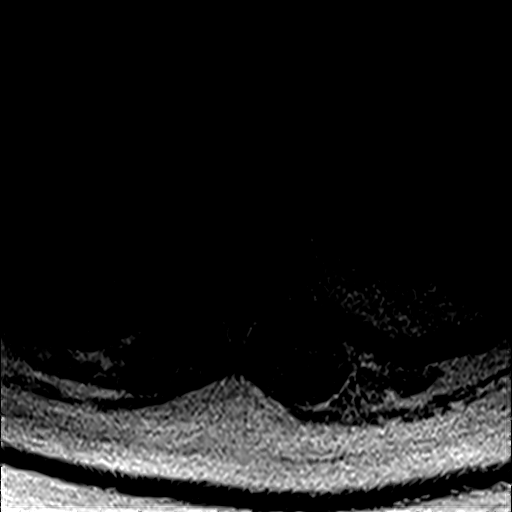

[31 of 48 positions shown; findings below may reference images not displayed]

FINDINGS: Segmentation: Examination technically limited by habitus and motion
artifact.

Standard segmentation. Lowest well-formed disc space labeled the
L5-S1 level.

Alignment: Exaggeration of the normal lumbar lordosis. Trace
anterolisthesis of L4 on L5, likely chronic and facet mediated.
Underlying trace scoliosis.

Vertebrae: Vertebral body height maintained without acute or chronic
fracture. Bone marrow signal intensity diffusely decreased on T1
weighted sequence, nonspecific, but most commonly related to anemia,
smoking or obesity. No worrisome osseous lesions. No significant
abnormal marrow edema.

Conus medullaris and cauda equina: Conus extends to approximately
the L1 level. Conus medullaris and nerve roots of the cauda equina
are grossly normal.

Paraspinal and other soft tissues: Mild diffuse edema present within
the lower posterior paraspinous musculature at L4 through S1.
Findings suspected to be related to adjacent facet arthritis,
although muscular injury/strain could also have this appearance
(series 11, images 6, 12). Paraspinous soft tissues demonstrate no
other visible acute abnormality. Partially visualized urinary
bladder is moderately distended. Visualized visceral structures
otherwise grossly unremarkable.

Disc levels:

L1-2:  Unremarkable.

L2-3: Mild annular disc bulge. No significant spinal stenosis.
Foramina remain patent.

L3-4: Mild annular disc bulge. Probable mild facet hypertrophy. No
significant spinal stenosis. Foramina remain grossly patent.

L4-5: Mild disc bulge with disc desiccation. Moderate right worse
than left facet hypertrophy. Resultant mild-to-moderate canal with
bilateral lateral recess stenosis. Mild left with moderate right L4
foraminal narrowing.

L5-S1: Disc desiccation with diffuse disc bulge. Superimposed left
subarticular disc protrusion contacts and mildly displaces the
descending left S1 nerve root (series 12, image 35). Mild to
moderate facet hypertrophy. Epidural lipomatosis. No significant
spinal stenosis. Foramina remain patent.
IMPRESSION: 1. Technically limited exam due to habitus and motion artifact.
2. No acute abnormality within the lumbar spine. Grossly normal
appearance of the conus medullaris and cauda equina.
3. Disc bulge with facet hypertrophy at L4-5 with resultant mild to
moderate canal and bilateral lateral recess stenosis, with moderate
right L4 foraminal narrowing.
4. Small left subarticular disc protrusion at L5-S1, contacting and
mildly displacing the descending left S1 nerve root.
5. Mild diffuse edema within the lower posterior paraspinous
musculature at L4 through S1, suspected to be related to adjacent
facet arthritis, although muscular injury/strain could also have
this appearance.

## 2021-04-12 IMAGING — MR MR THORACIC SPINE W/O CM
7 series · 45 of 48 positions shown · non-contrast
Comparison: None available.

CLINICAL DATA: Initial evaluation for acute myelopathy.

EXAM:
MRI THORACIC SPINE WITHOUT CONTRAST
TECHNIQUE: Multiplanar, multisequence MR imaging of the thoracic spine was
performed. No intravenous contrast was administered.

[Series 16: T1 · sagittal · 4.0mm · 1.72mm/px · 2 of 5 slices shown (1 of 3)]
[im 1/5]
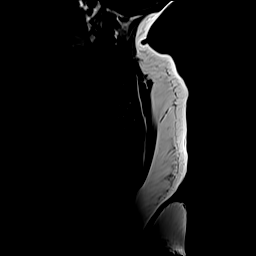
[im 5/5]
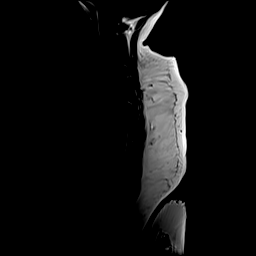

[Series 17: STIR · sagittal · 3.0mm · 1.00mm/px · 8 of 15 slices shown]
[im 1/15]
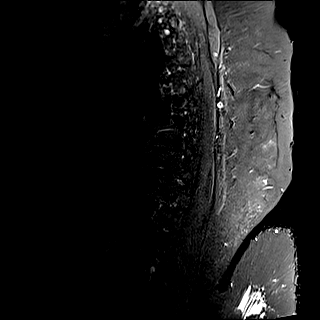
[im 3/15]
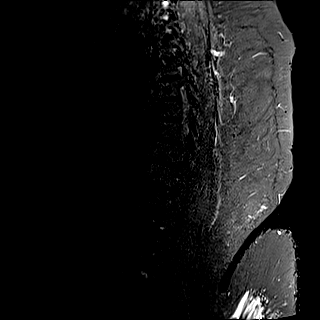
[im 5/15]
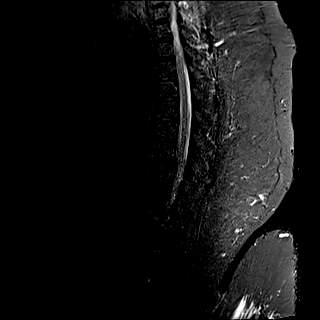
[im 7/15]
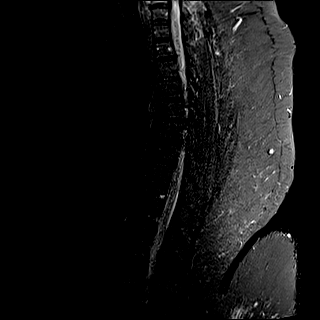
[im 9/15]
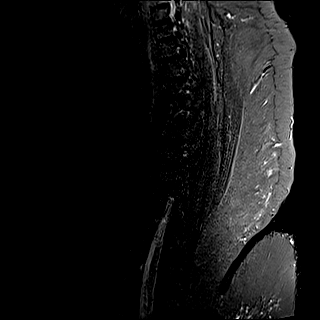
[im 11/15]
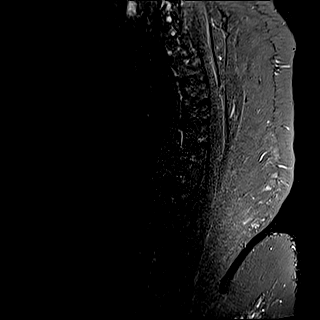
[im 13/15]
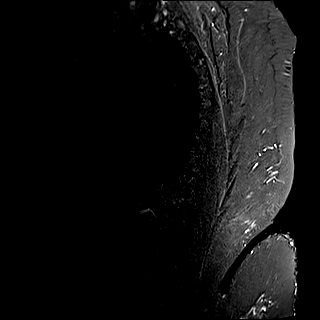
[im 15/15]
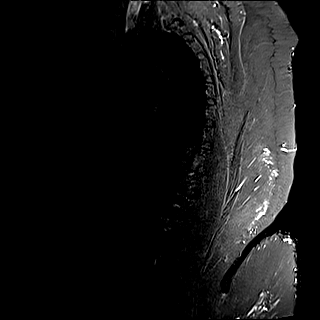

[Series 18: T1 · sagittal · 3.0mm · 1.25mm/px · 8 of 15 slices shown (2 of 3)]
[im 1/15]
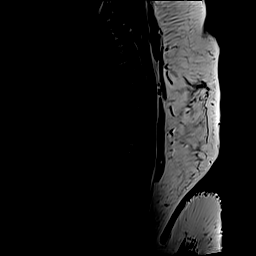
[im 3/15]
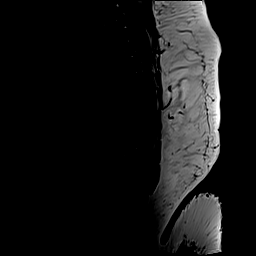
[im 5/15]
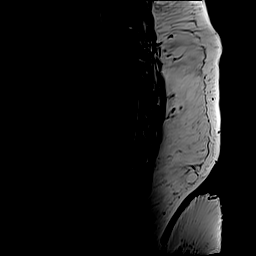
[im 7/15]
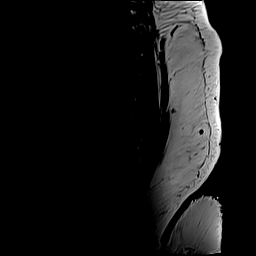
[im 9/15]
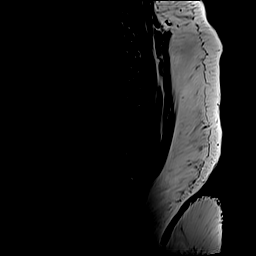
[im 11/15]
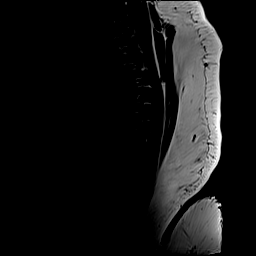
[im 13/15]
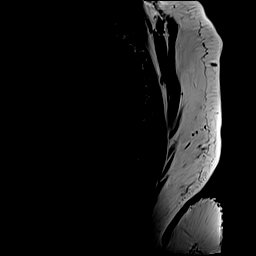
[im 15/15]
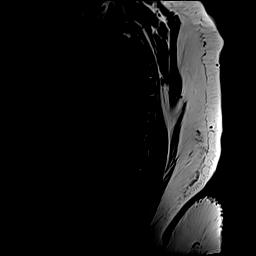

[Series 19: T2 · sagittal · 3.0mm · 1.00mm/px · 8 of 15 slices shown (1 of 2)]
[im 1/15]
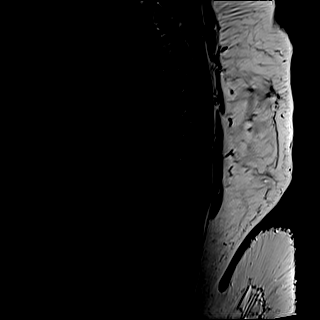
[im 3/15]
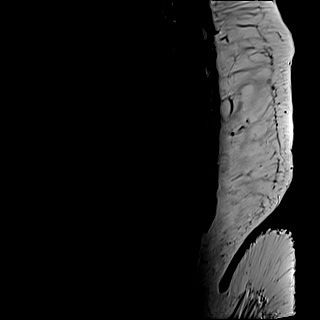
[im 5/15]
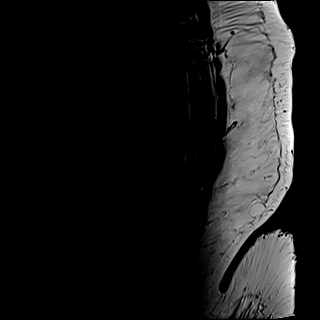
[im 7/15]
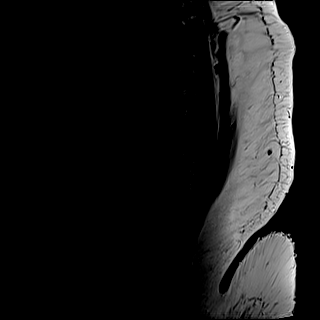
[im 9/15]
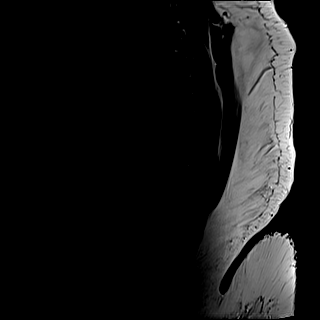
[im 11/15]
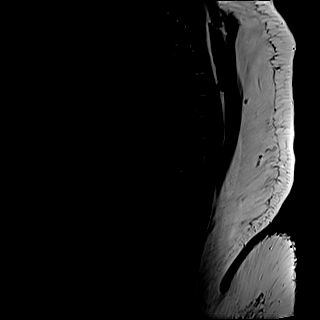
[im 13/15]
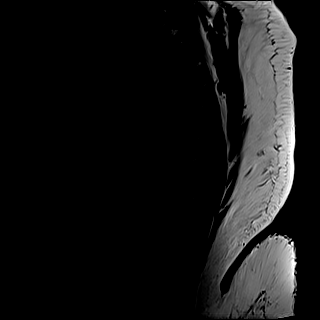
[im 15/15]
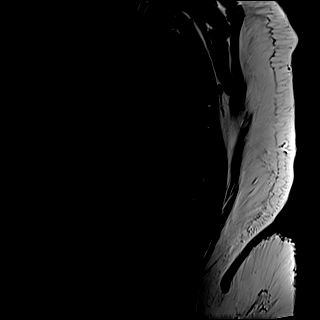

[Series 20: T1 · sagittal · 3.0mm · 1.25mm/px · 8 of 15 slices shown (3 of 3)]
[im 1/15]
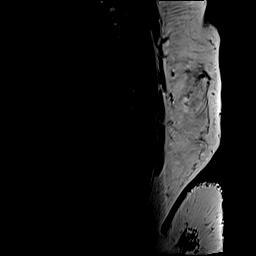
[im 3/15]
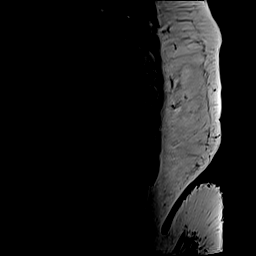
[im 5/15]
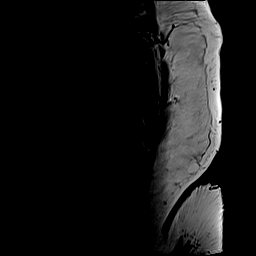
[im 7/15]
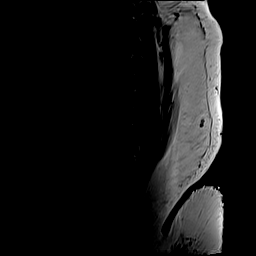
[im 9/15]
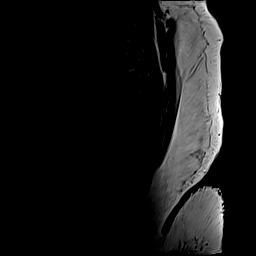
[im 11/15]
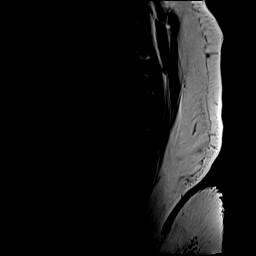
[im 13/15]
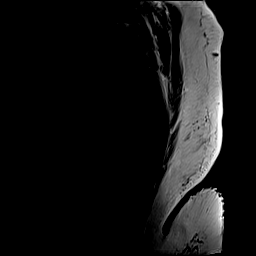
[im 15/15]
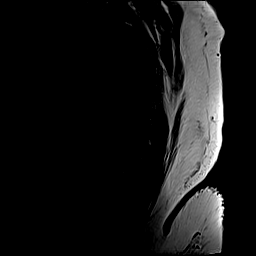

[Series 21: T2 · axial · 4.0mm · 0.78mm/px · z∈[-435,-224]mm · 7 of 14 slices shown (2 of 2)]
[im 1/14]
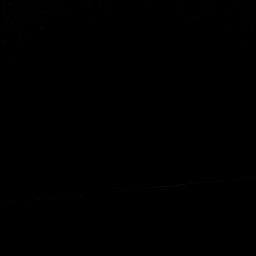
[im 3/14]
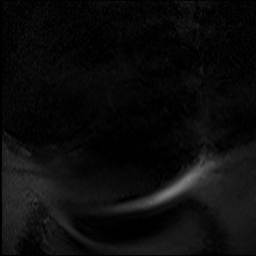
[im 5/14]
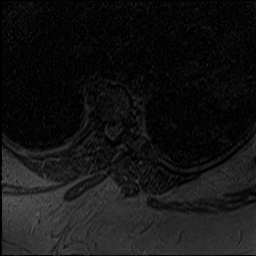
[im 7/14]
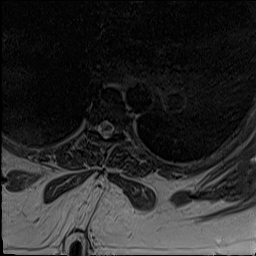
[im 9/14]
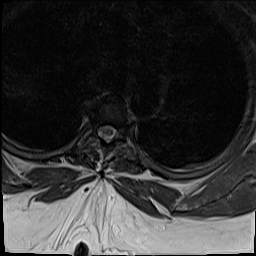
[im 11/14]
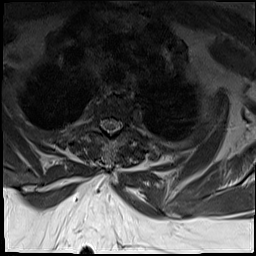
[im 14/14]
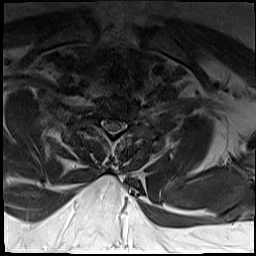

[Series 22: t2_me2d_tra · axial · 4.0mm · 0.39mm/px · z∈[-435,-312]mm · 4 of 14 slices shown]
[im 1/14]
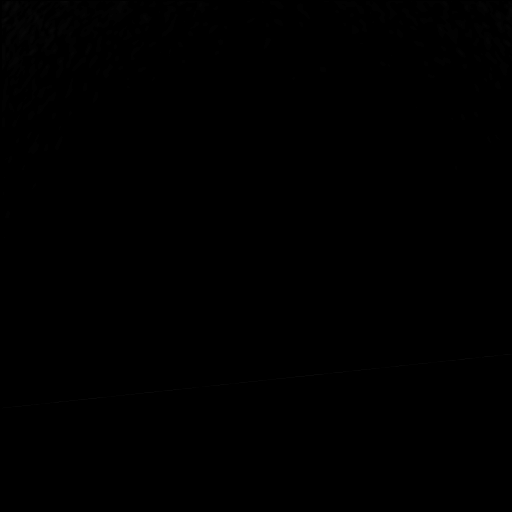
[im 3/14]
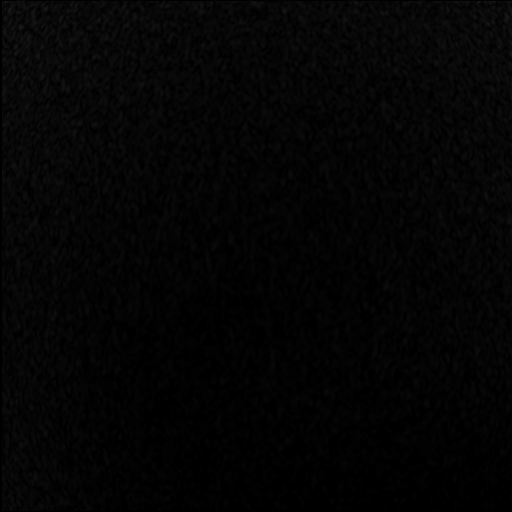
[im 5/14]
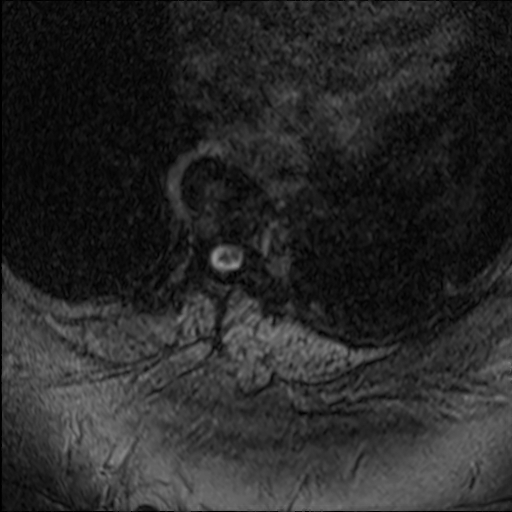
[im 7/14]
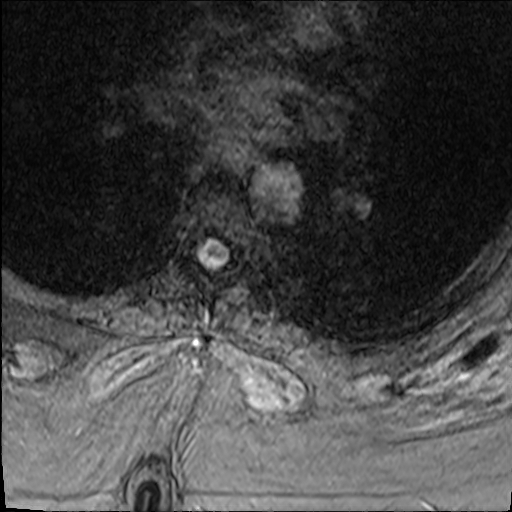

[45 of 48 positions shown; findings below may reference images not displayed]

FINDINGS: Alignment: Examination severely degraded by motion and habitus.
Additionally, patient was unable to tolerate the full length of the
exam. Axial sequences are incomplete, with only a portion of the
upper thoracic spine visualized.

Dextroscoliosis. Alignment otherwise normal preservation of the
normal thoracic kyphosis. No listhesis.

Vertebrae: Vertebral body height maintained without acute or chronic
fracture. Bone marrow signal intensity diffusely decreased on T1
weighted sequence, nonspecific, but most commonly related to anemia,
smoking or obesity. No worrisome osseous lesions. No abnormal marrow
edema.

Cord: Grossly normal signal intensity throughout the thoracic spinal
cord. No visible cord signal changes on this limited study.

Paraspinal and other soft tissues: Visualized paraspinous soft
tissues unremarkable.

Disc levels:

T2-3: Small central to left central disc protrusion indents the
ventral thecal sac (series 19, image 7). No significant stenosis.

T7-8: Small central disc protrusion with inferior migration (series
19, image 5). Mild spinal stenosis at this level with probable
minimal flattening of the ventral cord, but no visible cord signal
changes. Foramina remain patent.

Otherwise, no other significant disc pathology seen within the
thoracic spine. No other visible spinal stenosis. Foramina remain
largely patent.
IMPRESSION: 1. Technically limited exam due to extensive motion artifact and
patient's inability to tolerate the full length of the study.
2. No definite acute abnormality within the thoracic spine. No
appreciable cord signal abnormality on this limited exam.
3. Small disc protrusions at T2-3 and T7-8 with resultant mild
spinal stenosis at the T7-8 level. No other significant stenosis
within the thoracic spine.

## 2021-04-12 MED ORDER — LORAZEPAM 2 MG/ML IJ SOLN: 1.0000 mg | Freq: Once | INTRAMUSCULAR | Status: DC | PRN

## 2021-04-12 MED ORDER — SODIUM CHLORIDE 0.9 % IV SOLN
100.0000 mL/h | INTRAVENOUS | Status: DC
  Administered 2021-04-12: 100 mL/h via INTRAVENOUS

## 2021-04-12 MED ORDER — SODIUM CHLORIDE 0.9 % IV BOLUS
500.0000 mL | Freq: Once | INTRAVENOUS | Status: AC
  Administered 2021-04-12: 500 mL via INTRAVENOUS

## 2021-04-12 NOTE — ED Triage Notes (Signed)
Pt BIBA. Per EMS pt presents with c/o bed sore on her buttocks, edema in both lower extremities and possible SNF placement. Per EMS, family is unable to care for her at home and was trying to get her placed into [place] for long term care.    BP 146/98 HR 100 RR 20 Cbg 341 Sp02 98% room air.

## 2021-04-12 NOTE — ED Notes (Signed)
Patient taken to MRI at this time 

## 2021-04-12 NOTE — ED Notes (Signed)
Patient remains in MRI at this time.

## 2021-04-12 NOTE — ED Provider Notes (Addendum)
[facility] DEPT Provider Note   CSN: [id number] Arrival date & time: 04/12/21  1420     History  Chief Complaint  Patient presents with   Wound Check   SNF placement    Makayla [name] is a 56 y.o. female.   Wound Check Pertinent negatives include no chest pain, no abdominal pain and no shortness of breath.  Patient presents to the ED for evaluation of generalized weakness.  Previous records reviewed and patient was recently in the hospital in December for issues with cellulitis, .  Diabetes and chronic kidney disease.  Patient was post to be discharged with physical therapy and home health assistance. patient ended up returning to the emergency room on December 27.  At that time she was being reevaluated for generalized weakness.  She had not been taking antibiotics that have been prescribed.  Patient ended up being observed overnight in the emergency room and ultimately had transition of care team assisting.  Patient was ultimately discharged after improving.  Patient returns to the ED today because she is unable to care for herself at home.  Family states they have been trying to get her into a long-term care facility.  She has had sores on her buttocks in a.m. edema on both lower legs.  Patient states in the last several days to week she has not been able to walk properly.  She denies any recent fevers.  No vomiting or diarrhea.    Home Medications Prior to Admission medications   Medication Sig Start Date End Date Taking? Authorizing Provider  acetaminophen (TYLENOL) 325 MG tablet Take 650 mg by mouth every 6 (six) hours as needed for moderate pain.    [provider]  albuterol (PROVENTIL HFA;VENTOLIN HFA) 108 (90 BASE) MCG/ACT inhaler Inhale 2 puffs into the lungs every 6 (six) hours as needed for wheezing or shortness of breath. For shortness of breath    [provider]  amLODipine (NORVASC) 5 MG tablet Take 1 tablet by mouth daily.  03/24/21 06/22/21  Kayleen Memos, DO  B-D UF III MINI PEN NEEDLES 31G X 5 MM MISC See admin instructions. 04/28/15   [provider]  chlorzoxazone (PARAFON) 500 MG tablet Take 500 mg by mouth daily as needed for muscle spasms.    [provider]  cyclobenzaprine (FLEXERIL) 5 MG tablet Take 1 tablet twice a day as needed for Muscle spasms. Patient taking differently: Take 5 mg by mouth daily as needed for muscle spasms. 01/31/15   Elayne Snare, MD  gabapentin (NEURONTIN) 400 MG capsule Take 400 mg by mouth daily as needed (pain).    [provider]  glipiZIDE (GLUCOTROL) 5 MG tablet Take 5 mg by mouth daily before breakfast.    [provider]  glucose blood (ONETOUCH VERIO) test strip Use as instructed to check blood sugar 2 times daily Dx code E11.9 11/03/14   Elayne Snare, MD  HYDROcodone-acetaminophen (NORCO) 5-325 MG tablet Take 2 tablets by mouth every 4 (four) hours as needed. Patient taking differently: Take 2 tablets by mouth every 4 (four) hours as needed for severe pain. 10/22/20   Daleen Bo, MD  Insulin Syringe-Needle U-100 (SAFETY-GLIDE 0.3CC SYR 29GX1/2) 29G X 1/2" 0.3 ML MISC Use as directed. 12/11/12   Hongalgi, Lenis Dickinson, MD  liraglutide (VICTOZA) 18 MG/3ML SOPN Inject 0.6 mg into the skin 2 (two) times daily.    [provider]  Multiple Vitamin (MULTIVITAMIN WITH MINERALS) TABS tablet Take 1 tablet by  mouth daily. 03/24/21 06/22/21  Kayleen Memos, DO  SYMBICORT 160-4.5 MCG/ACT inhaler Inhale 2 puffs into the lungs 2 (two) times daily. 10/02/17   [provider]      Allergies    Ibuprofen, Peach flavor, Penicillins, and Strawberry extract    Review of Systems   Review of Systems  Constitutional:  Negative for fever.  Respiratory:  Negative for shortness of breath.   Cardiovascular:  Negative for chest pain.  Gastrointestinal:  Negative for abdominal pain.   Physical Exam Updated Vital Signs BP (!) 159/84    Pulse (!) 108     Temp 98.9 F (37.2 C) (Oral)    Resp 18    Ht 1.626 m (5\' 4" )    Wt (!) 181.4 kg    LMP 12/08/2012    SpO2 96%    BMI 68.66 kg/m  Physical Exam Vitals and nursing note reviewed.  Constitutional:      Appearance: She is well-developed.     Comments: Elevated BMI  HENT:     Head: Normocephalic and atraumatic.     Right Ear: External ear normal.     Left Ear: External ear normal.  Eyes:     General: No scleral icterus.       Right eye: No discharge.        Left eye: No discharge.     Conjunctiva/sclera: Conjunctivae normal.  Neck:     Trachea: No tracheal deviation.  Cardiovascular:     Rate and Rhythm: Normal rate and regular rhythm.  Pulmonary:     Effort: Pulmonary effort is normal. No respiratory distress.     Breath sounds: Normal breath sounds. No stridor. No wheezing or rales.  Abdominal:     General: Bowel sounds are normal. There is no distension.     Palpations: Abdomen is soft.     Tenderness: There is no abdominal tenderness. There is no guarding or rebound.  Musculoskeletal:        General: No tenderness or deformity.     Cervical back: Neck supple.  Skin:    General: Skin is warm and dry.     Findings: No rash.     Comments: No lesions noted in the abdomen, superficial sores noted in the buttocks, no induration or purulence  Neurological:     Mental Status: She is alert.     Cranial Nerves: No cranial nerve deficit (no facial droop, extraocular movements intact, no slurred speech).     Sensory: No sensory deficit.     Motor: No abnormal muscle tone or seizure activity.     Coordination: Coordination normal.     Comments: Patient unable to lift her legs off the bed, is able to lift both arms  Psychiatric:        Mood and Affect: Mood normal.    ED Results / Procedures / Treatments   Labs (all labs ordered are listed, but only abnormal results are displayed) Labs Reviewed  CBC - Abnormal; Notable for the following components:      Result Value   WBC 13.2  (*)    All other components within normal limits  DIFFERENTIAL - Abnormal; Notable for the following components:   Neutro Abs 9.9 (*)    Monocytes Absolute 1.5 (*)    Abs Immature Granulocytes 0.18 (*)    All other components within normal limits  COMPREHENSIVE METABOLIC PANEL - Abnormal; Notable for the following components:   Sodium 125 (*)    Chloride 94 (*)  Glucose, Bld 286 (*)    Creatinine, Ser 1.42 (*)    Albumin 3.1 (*)    GFR, Estimated 44 (*)    All other components within normal limits  URINALYSIS, ROUTINE W REFLEX MICROSCOPIC - Abnormal; Notable for the following components:   APPearance CLOUDY (*)    Hgb urine dipstick LARGE (*)    Protein, ur 100 (*)    Leukocytes,Ua LARGE (*)    Bacteria, UA FEW (*)    All other components within normal limits  I-STAT CHEM 8, ED - Abnormal; Notable for the following components:   Sodium 127 (*)    Chloride 94 (*)    Creatinine, Ser 1.30 (*)    Glucose, Bld 281 (*)    Calcium, Ion 1.14 (*)    Hemoglobin 15.3 (*)    All other components within normal limits  RESP PANEL BY RT-PCR (FLU A&B, COVID) ARPGX2  CULTURE, BLOOD (ROUTINE X 2)  CULTURE, BLOOD (ROUTINE X 2)  ETHANOL  PROTIME-INR  APTT  RAPID URINE DRUG SCREEN, HOSP PERFORMED  LACTIC ACID, PLASMA  LACTIC ACID, PLASMA  AMMONIA  I-STAT BETA HCG BLOOD, ED ([facility], [facility], [facility] ONLY)    EKG EKG Interpretation  Date/Time:  Wednesday April 12 2021 17:19:56 EST Ventricular Rate:  109 PR Interval:  160 QRS Duration: 74 QT Interval:  332 QTC Calculation: 447 R Axis:   49 Text Interpretation: Sinus tachycardia Possible Left atrial enlargement Borderline ECG When compared with ECG of 24-Dec-2019 14:09, Since last tracing rate faster Confirmed by Dorie Rank [phone]) on 04/12/2021 5:24:34 PM  Radiology CT HEAD WO CONTRAST  Result Date: 04/12/2021 CLINICAL DATA:  Neuro deficit. EXAM: CT HEAD WITHOUT CONTRAST TECHNIQUE: Contiguous axial images were obtained from the base of the skull  through the vertex without intravenous contrast. COMPARISON:  None. FINDINGS: Brain: No evidence of acute infarction, hemorrhage, hydrocephalus, extra-axial collection or mass lesion/mass effect. Bilateral anterior temporal lobe atrophy. Vascular: Atherosclerotic vascular calcification of the carotid siphons. No hyperdense vessel. Skull: Normal. Negative for fracture or focal lesion. Sinuses/Orbits: No acute finding. Other: None. IMPRESSION: 1. No acute intracranial abnormality. Electronically Signed   By: Titus Dubin M.D.   On: 04/12/2021 17:08   MR BRAIN WO CONTRAST  Result Date: 04/13/2021 CLINICAL DATA:  Initial evaluation for neuro deficit, stroke suspected. EXAM: MRI HEAD WITHOUT CONTRAST TECHNIQUE: Multiplanar, multiecho pulse sequences of the brain and surrounding structures were obtained without intravenous contrast. COMPARISON:  Head CT from earlier the same day. FINDINGS: Brain: Examination moderately degraded by motion artifact. Diffuse prominence of the CSF containing spaces compatible with cerebral atrophy, advanced for age. Changes are most pronounced at the anterior temporal poles bilaterally where there is fairly pronounced atrophy/encephalomalacia (series 8, image 9). Associated T2/FLAIR hyperintensity seen within this region. Finding is nonspecific, and could possibly related to prior trauma. Changes of CADASIL or possibly chronic microvascular ischemic disease could also be considered. No abnormal foci of restricted diffusion to suggest acute or subacute ischemia. Gray-white matter differentiation otherwise maintained. No other areas of chronic cortical infarction. No visible evidence for acute or chronic intracranial hemorrhage. No mass lesion, mass effect or midline shift. No hydrocephalus or extra-axial fluid collection. Pituitary gland suprasellar region within normal limits. Midline structures intact. Vascular: Right vertebral artery hypoplastic and not well seen. Major intracranial  vascular flow voids are otherwise maintained. Skull and upper cervical spine: Craniocervical junction within normal limits. Bone marrow signal intensity diffusely decreased on T1 weighted sequence, nonspecific, but most commonly related to  anemia, smoking or obesity. No focal marrow replacing lesion. No scalp soft tissue abnormality. Sinuses/Orbits: Globes orbital soft tissues demonstrate no acute finding. Paranasal sinuses are largely clear. No significant mastoid effusion. Other: None. IMPRESSION: 1. No acute intracranial abnormality. 2. Advanced cerebral atrophy for age, with fairly pronounced atrophy/encephalomalacia involving the anterior temporal poles bilaterally. Finding is nonspecific, and could possibly related to prior trauma or ischemia. Changes of CADASIL could also be considered. Electronically Signed   By: Jeannine Boga M.D.   On: 04/13/2021 00:05   MR Cervical Spine Wo Contrast  Result Date: 04/13/2021 CLINICAL DATA:  Initial evaluation for acute myelopathy. EXAM: MRI CERVICAL SPINE WITHOUT CONTRAST TECHNIQUE: Multiplanar, multisequence MR imaging of the cervical spine was performed. No intravenous contrast was administered. COMPARISON:  None available. FINDINGS: Alignment: Examination technically limited due to habitus and extensive motion artifact, limiting assessment. Straightening of the normal cervical lordosis. No significant listhesis. Vertebrae: Vertebral body height maintained without acute or chronic fracture. Bone marrow signal intensity diffusely decreased on T1 weighted sequence, nonspecific, but most commonly related to anemia, smoking or obesity. No worrisome osseous lesions. No visible abnormal marrow edema. Cord: Signal intensity within the cervical spinal cord is grossly within normal limits. No obvious cord signal abnormality, although evaluation limited by motion. Posterior Fossa, vertebral arteries, paraspinal tissues: Paraspinous soft tissues within normal limits.  Normal flow voids seen within the vertebral arteries bilaterally. Disc levels: C2-C3: Grossly negative. C3-C4: Left paracentral to subarticular disc protrusion indents the left ventral thecal sac (series 8, image 7). Mild flattening of the left hemi cord without definite cord signal changes. Up to moderate left-sided spinal stenosis. Foramina are grossly patent, although evaluation limited by motion. C4-C5: Mild disc bulge. No definite significant spinal stenosis. Foramina grossly patent, although evaluation limited by motion. C5-C6: Disc bulge with endplate and uncovertebral spurring. Secondary flattening of the ventral thecal sac with no more than mild spinal stenosis. No obvious cord impingement. Foramina are grossly patent allowing for motion. C6-C7: Disc bulge with endplate and uncovertebral spurring, slightly asymmetric to the left. No more than mild spinal stenosis. Evaluation of the foramina is severely limited by motion, although there is suspected at least mild spinal stenosis on the left. Right neural foramen is grossly patent. C7-T1: Minimal disc bulge. No appreciable spinal stenosis. Foramina are grossly patent. IMPRESSION: 1. Technically limited exam due to habitus and extensive motion artifact. 2. No definite acute abnormality within the cervical spine. No obvious cord signal changes on this limited exam. 3. Left paracentral to subarticular disc protrusion at C3-4 with resultant mild flattening of the left hemi cord and suspected moderate left-sided spinal stenosis. 4. Left eccentric disc bulge with uncovertebral spurring at C6-7 with resultant at least mild spinal stenosis. 5. Additional mild noncompressive disc bulging at C4-5, C5-6, and C7-T1 without appreciable stenosis. Foramina are grossly patent, although evaluation is severely limited by motion artifact. Electronically Signed   By: Jeannine Boga M.D.   On: 04/13/2021 00:13   MR THORACIC SPINE WO CONTRAST  Result Date:  04/13/2021 CLINICAL DATA:  Initial evaluation for acute myelopathy. EXAM: MRI THORACIC SPINE WITHOUT CONTRAST TECHNIQUE: Multiplanar, multisequence MR imaging of the thoracic spine was performed. No intravenous contrast was administered. COMPARISON:  None available. FINDINGS: Alignment: Examination severely degraded by motion and habitus. Additionally, patient was unable to tolerate the full length of the exam. Axial sequences are incomplete, with only a portion of the upper thoracic spine visualized. Dextroscoliosis. Alignment otherwise normal preservation of the normal thoracic kyphosis.  No listhesis. Vertebrae: Vertebral body height maintained without acute or chronic fracture. Bone marrow signal intensity diffusely decreased on T1 weighted sequence, nonspecific, but most commonly related to anemia, smoking or obesity. No worrisome osseous lesions. No abnormal marrow edema. Cord: Grossly normal signal intensity throughout the thoracic spinal cord. No visible cord signal changes on this limited study. Paraspinal and other soft tissues: Visualized paraspinous soft tissues unremarkable. Disc levels: T2-3: Small central to left central disc protrusion indents the ventral thecal sac (series 19, image 7). No significant stenosis. T7-8: Small central disc protrusion with inferior migration (series 19, image 5). Mild spinal stenosis at this level with probable minimal flattening of the ventral cord, but no visible cord signal changes. Foramina remain patent. Otherwise, no other significant disc pathology seen within the thoracic spine. No other visible spinal stenosis. Foramina remain largely patent. IMPRESSION: 1. Technically limited exam due to extensive motion artifact and patient's inability to tolerate the full length of the study. 2. No definite acute abnormality within the thoracic spine. No appreciable cord signal abnormality on this limited exam. 3. Small disc protrusions at T2-3 and T7-8 with resultant mild  spinal stenosis at the T7-8 level. No other significant stenosis within the thoracic spine. Electronically Signed   By: Jeannine Boga M.D.   On: 04/13/2021 00:23    Procedures .1-3 Lead EKG Interpretation Performed by: Dorie Rank, MD Authorized by: Dorie Rank, MD     Interpretation: normal     ECG rate:  105   ECG rate assessment: normal     Rhythm: sinus rhythm     Ectopy: none     Conduction: normal      Medications Ordered in ED Medications  sodium chloride 0.9 % bolus 500 mL (500 mLs Intravenous Bolus 04/12/21 1715)    Followed by  0.9 %  sodium chloride infusion (100 mL/hr Intravenous New Bag/Given 04/12/21 1843)  LORazepam (ATIVAN) injection 1 mg (has no administration in time range)    ED Course/ Medical Decision Making/ A&P Clinical Course as of 04/13/21 0030  Wed Apr 12, 2021  1734 Hyponatremia noted.  Similar to previous [JK]  1735 CBC(!) Leukocytosis noted, increased from previous [JK]  1735 Head CT without acute findings [JK]  1906 D/w Dr Posey Pronto.  Requests MRI imaging first to rule out acute surgical issue. [JK]  Thu Apr 13, 2021  0029 MRI findings reviewed.  Mild stenosis noticed areas but no signs of any mass or significant spinal cord injury to account for her weakness [JK]    Clinical Course User Index [JK] Dorie Rank, MD                           Medical Decision Making  Patient presents with worsening weakness.  No signs of significant anemia.  Patient has hyponatremia and this is progressively worse but not significantly different than previous values that I think it would account for her symptoms.  Patient does have hyperglycemia but no signs of acidosis.  Not quite high enough to cause hyperosmolar syndrome.  Patient does have progressive weakness of her extremities.  She is now not able to ambulate.  May be multifactorial but I do think she would benefit from spinal imaging.  No acute findings on head CT but will follow up with MRI of head and neck  thorax and lumbar spine to rule out any compressive etiology to account for extremity weakness.  Patient's not able to care for self  at home.  I will consult the medical service for admission.        Final Clinical Impression(s) / ED Diagnoses Final diagnoses:  Weakness  BMI 60.0-69.9, adult ([place])  Hyponatremia      Dorie Rank, MD 04/12/21 Sharene Skeans, MD 04/12/21 Loretha Stapler    Dorie Rank, MD 04/13/21 0030

## 2021-04-12 NOTE — ED Notes (Signed)
Pt transported to CT ?

## 2021-04-13 DIAGNOSIS — E871 Hypo-osmolality and hyponatremia: Secondary | ICD-10-CM | POA: Diagnosis not present

## 2021-04-13 DIAGNOSIS — R8290 Unspecified abnormal findings in urine: Secondary | ICD-10-CM | POA: Diagnosis present

## 2021-04-13 DIAGNOSIS — R29898 Other symptoms and signs involving the musculoskeletal system: Secondary | ICD-10-CM | POA: Diagnosis not present

## 2021-04-13 DIAGNOSIS — R9089 Other abnormal findings on diagnostic imaging of central nervous system: Secondary | ICD-10-CM | POA: Diagnosis not present

## 2021-04-13 DIAGNOSIS — E1159 Type 2 diabetes mellitus with other circulatory complications: Secondary | ICD-10-CM

## 2021-04-13 DIAGNOSIS — E119 Type 2 diabetes mellitus without complications: Secondary | ICD-10-CM

## 2021-04-13 DIAGNOSIS — I152 Hypertension secondary to endocrine disorders: Secondary | ICD-10-CM

## 2021-04-13 LAB — URINALYSIS, ROUTINE W REFLEX MICROSCOPIC
Bilirubin Urine: NEGATIVE
Glucose, UA: NEGATIVE mg/dL
Ketones, ur: NEGATIVE mg/dL
Nitrite: NEGATIVE
Protein, ur: 100 mg/dL — AB
Specific Gravity, Urine: 1.012 (ref 1.005–1.030)
pH: 6 (ref 5.0–8.0)

## 2021-04-13 LAB — BASIC METABOLIC PANEL
Anion gap: 6 (ref 5–15)
BUN: 14 mg/dL (ref 6–20)
CO2: 21 mmol/L — ABNORMAL LOW (ref 22–32)
Calcium: 8.3 mg/dL — ABNORMAL LOW (ref 8.9–10.3)
Chloride: 100 mmol/L (ref 98–111)
Creatinine, Ser: 1.11 mg/dL — ABNORMAL HIGH (ref 0.44–1.00)
GFR, Estimated: 59 mL/min — ABNORMAL LOW (ref >60)
Glucose, Bld: 238 mg/dL — ABNORMAL HIGH (ref 70–99)
Potassium: 4.6 mmol/L (ref 3.5–5.1)
Sodium: 127 mmol/L — ABNORMAL LOW (ref 135–145)

## 2021-04-13 LAB — SODIUM, URINE, RANDOM: Sodium, Ur: 23 mmol/L

## 2021-04-13 LAB — RAPID URINE DRUG SCREEN, HOSP PERFORMED
Amphetamines: NOT DETECTED
Barbiturates: NOT DETECTED
Benzodiazepines: NOT DETECTED
Cocaine: NOT DETECTED
Opiates: NOT DETECTED
Tetrahydrocannabinol: NOT DETECTED

## 2021-04-13 LAB — CBG MONITORING, ED
Glucose-Capillary: 233 mg/dL — ABNORMAL HIGH (ref 70–99)
Glucose-Capillary: 170 mg/dL — ABNORMAL HIGH (ref 70–99)

## 2021-04-13 MED ORDER — MOMETASONE FURO-FORMOTEROL FUM 200-5 MCG/ACT IN AERO
2.0000 | INHALATION_SPRAY | Freq: Two times a day (BID) | RESPIRATORY_TRACT | Status: DC
  Administered 2021-04-14 – 2021-04-19 (×10): 2 via RESPIRATORY_TRACT
  Filled 2021-04-13: qty 8.8

## 2021-04-13 MED ORDER — AMLODIPINE BESYLATE 5 MG PO TABS
5.0000 mg | ORAL_TABLET | Freq: Every day | ORAL | Status: DC
  Administered 2021-04-13 – 2021-04-19 (×7): 5 mg via ORAL
  Filled 2021-04-13 (×7): qty 1

## 2021-04-13 MED ORDER — ENOXAPARIN SODIUM 80 MG/0.8ML IJ SOSY
80.0000 mg | PREFILLED_SYRINGE | INTRAMUSCULAR | Status: DC
  Administered 2021-04-14 – 2021-04-19 (×6): 80 mg via SUBCUTANEOUS
  Filled 2021-04-13 (×6): qty 0.8

## 2021-04-13 MED ORDER — ACETAMINOPHEN 325 MG PO TABS
650.0000 mg | ORAL_TABLET | Freq: Four times a day (QID) | ORAL | Status: DC | PRN
  Administered 2021-04-15 – 2021-04-18 (×2): 650 mg via ORAL
  Filled 2021-04-13 (×3): qty 2

## 2021-04-13 MED ORDER — ENOXAPARIN SODIUM 40 MG/0.4ML IJ SOSY: 40.0000 mg | PREFILLED_SYRINGE | INTRAMUSCULAR | Status: DC

## 2021-04-13 MED ORDER — HYDRALAZINE HCL 25 MG PO TABS: 25.0000 mg | ORAL_TABLET | Freq: Four times a day (QID) | ORAL | Status: DC | PRN

## 2021-04-13 MED ORDER — ACETAMINOPHEN 650 MG RE SUPP: 650.0000 mg | Freq: Four times a day (QID) | RECTAL | Status: DC | PRN

## 2021-04-13 MED ORDER — ONDANSETRON HCL 4 MG PO TABS: 4.0000 mg | ORAL_TABLET | Freq: Four times a day (QID) | ORAL | Status: DC | PRN

## 2021-04-13 MED ORDER — SODIUM CHLORIDE 0.9 % IV SOLN
1.0000 g | Freq: Once | INTRAVENOUS | Status: AC
  Administered 2021-04-13: 1 g via INTRAVENOUS
  Filled 2021-04-13: qty 10

## 2021-04-13 MED ORDER — INSULIN ASPART 100 UNIT/ML IJ SOLN
0.0000 [IU] | Freq: Three times a day (TID) | INTRAMUSCULAR | Status: DC
  Administered 2021-04-13: 4 [IU] via SUBCUTANEOUS
  Administered 2021-04-13: 7 [IU] via SUBCUTANEOUS
  Administered 2021-04-14: 3 [IU] via SUBCUTANEOUS
  Administered 2021-04-14: 4 [IU] via SUBCUTANEOUS
  Administered 2021-04-15 (×2): 3 [IU] via SUBCUTANEOUS
  Administered 2021-04-16: 4 [IU] via SUBCUTANEOUS
  Administered 2021-04-16: 7 [IU] via SUBCUTANEOUS
  Administered 2021-04-16 – 2021-04-17 (×4): 4 [IU] via SUBCUTANEOUS
  Administered 2021-04-18: 3 [IU] via SUBCUTANEOUS
  Administered 2021-04-18: 4 [IU] via SUBCUTANEOUS
  Administered 2021-04-18: 3 [IU] via SUBCUTANEOUS
  Administered 2021-04-19: 7 [IU] via SUBCUTANEOUS
  Administered 2021-04-19: 4 [IU] via SUBCUTANEOUS
  Filled 2021-04-13: qty 0.2

## 2021-04-13 MED ORDER — ONDANSETRON HCL 4 MG/2ML IJ SOLN: 4.0000 mg | Freq: Four times a day (QID) | INTRAMUSCULAR | Status: DC | PRN

## 2021-04-13 NOTE — H&P (Addendum)
History and Physical    Makayla [name] YQI:[id number] DOB: 1965-08-25 DOA: 04/12/2021  PCP: Vonna Drafts, FNP  Patient coming from: Home  Chief Complaint: Outpatient practitioner referred to ED for SNF  HPI: Makayla [name] is a 56 y.o. female with medical history significant of recent cellulitis, CKD stage IIIb, hypertension, neuropathy, diabetes mellitus, obesity. Patient presented at the suggestion of her outpatient practitioner secondary to ongoing weakness and inability to be cared for at home. Patient was recently admitted and treated for abdominal cellulitis and fall. After discharge, she and her family have had significant trouble finding the means to properly care for the patient. Care is complicated by patient's weakness and body habitus.  ED Course: Vitals: Temperature of 99.5 F, pulse of 106, respirations of 20 breaths per minute, BP of 141/77, SpO2 of 98% on room air Labs: Sodium of 125 > 127, chloride of 94, Creatinine of 1.42 > 1.3, albumin of 3.1, WBC of 13,200 Imaging: MRI brain without acute process but does show advanced cerebral atrophy consistent with possible trauma vs ischemia. MRI cervical/thoracic/lumbar spine significant for multilevel spinal stenosis/degenerative changes Medications/Course: Ceftriaxone IV, 500 mL NS bolus  Review of Systems: Review of Systems  Constitutional:  Negative for chills and fever.  Respiratory:  Negative for cough and shortness of breath.   Cardiovascular:  Positive for leg swelling. Negative for chest pain.  Gastrointestinal:  Negative for abdominal pain, constipation, diarrhea, nausea and vomiting.  Neurological:  Positive for weakness. Negative for focal weakness.  All other systems reviewed and are negative.  Past Medical History:  Diagnosis Date   Cellulitis    Chronic bronchitis (HCC)    CKD (chronic kidney disease), stage III ([place])    Diabetes mellitus    Hyperlipidemia    Hypertension    Neuropathy     Past Surgical  History:  Procedure Laterality Date   TONSILLECTOMY       reports that she has been smoking cigarettes. She has a 2.30 pack-year smoking history. She has never used smokeless tobacco. She reports that she does not currently use alcohol. She reports that she does not use drugs.  Allergies  Allergen Reactions   Ibuprofen Other (See Comments)    Can not take due to kidney problems.    Peach Flavor Swelling    Peaches.    Penicillins     Inflates bronchitis.  Has patient had a PCN reaction causing immediate rash, facial/tongue/throat swelling, SOB or lightheadedness with hypotension: Yes- shortness of breathe.  Has patient had a PCN reaction causing severe rash involving mucus membranes or skin necrosis: No Has patient had a PCN reaction that required hospitalization No Has patient had a PCN reaction occurring within the last 10 years: No If all of the above answers are "NO", then may proceed with Cephalosporin use.    Strawberry Extract Swelling    Family History  Problem Relation Age of Onset   Diabetes Mother    Hypertension Mother    Hyperlipidemia Mother    Diabetes Father    Heart disease Father    Diabetes Sister    Prior to Admission medications   Medication Sig Start Date End Date Taking? Authorizing Provider  acetaminophen (TYLENOL) 325 MG tablet Take 650 mg by mouth every 6 (six) hours as needed for moderate pain.    [provider]  albuterol (PROVENTIL HFA;VENTOLIN HFA) 108 (90 BASE) MCG/ACT inhaler Inhale 2 puffs into the lungs every 6 (six) hours as needed for wheezing or shortness of  breath. For shortness of breath    [provider]  amLODipine (NORVASC) 5 MG tablet Take 1 tablet by mouth daily. 03/24/21 06/22/21  Kayleen Memos, DO  B-D UF III MINI PEN NEEDLES 31G X 5 MM MISC See admin instructions. 04/28/15   [provider]  chlorzoxazone (PARAFON) 500 MG tablet Take 500 mg by mouth daily as needed for muscle spasms.    [provider]  cyclobenzaprine (FLEXERIL) 5 MG tablet Take 1 tablet twice a day as needed for Muscle spasms. Patient taking differently: Take 5 mg by mouth daily as needed for muscle spasms. 01/31/15   Elayne Snare, MD  gabapentin (NEURONTIN) 400 MG capsule Take 400 mg by mouth daily as needed (pain).    [provider]  glipiZIDE (GLUCOTROL) 5 MG tablet Take 5 mg by mouth daily before breakfast.    [provider]  glucose blood (ONETOUCH VERIO) test strip Use as instructed to check blood sugar 2 times daily Dx code E11.9 11/03/14   Elayne Snare, MD  HYDROcodone-acetaminophen (NORCO) 5-325 MG tablet Take 2 tablets by mouth every 4 (four) hours as needed. Patient taking differently: Take 2 tablets by mouth every 4 (four) hours as needed for severe pain. 10/22/20   Daleen Bo, MD  Insulin Syringe-Needle U-100 (SAFETY-GLIDE 0.3CC SYR 29GX1/2) 29G X 1/2" 0.3 ML MISC Use as directed. 12/11/12   Hongalgi, Lenis Dickinson, MD  liraglutide (VICTOZA) 18 MG/3ML SOPN Inject 0.6 mg into the skin 2 (two) times daily.    [provider]  Multiple Vitamin (MULTIVITAMIN WITH MINERALS) TABS tablet Take 1 tablet by mouth daily. 03/24/21 06/22/21  Kayleen Memos, DO  SYMBICORT 160-4.5 MCG/ACT inhaler Inhale 2 puffs into the lungs 2 (two) times daily. 10/02/17   [provider]    Physical Exam:  Physical Exam Vitals reviewed.  Constitutional:      General: She is not in acute distress.    Appearance: She is morbidly obese. She is not diaphoretic.  Eyes:     Conjunctiva/sclera: Conjunctivae normal.     Pupils: Pupils are equal, round, and reactive to light.  Cardiovascular:     Rate and Rhythm: Normal rate and regular rhythm.     Heart sounds: Normal heart sounds. No murmur heard. Pulmonary:     Effort: Pulmonary effort is normal. No respiratory distress.     Breath sounds: Normal breath sounds. No wheezing or rales.  Abdominal:     General: Bowel sounds are normal. There is no  distension.     Palpations: Abdomen is soft.     Tenderness: There is no abdominal tenderness. There is no guarding or rebound.  Musculoskeletal:        General: No tenderness. Normal range of motion.     Cervical back: Normal range of motion.  Lymphadenopathy:     Cervical: No cervical adenopathy.  Skin:    General: Skin is warm and dry.     Findings: Abrasion (inner thigh) present.  Neurological:     Mental Status: She is alert and oriented to person, place, and time.    Labs on Admission: I have personally reviewed following labs and imaging studies  CBC: Recent Labs  Lab 04/12/21 1706 04/12/21 1720  WBC 13.2*  --   NEUTROABS 9.9*  --   HGB 13.3 15.3*  HCT 40.8 45.0  MCV 83.6  --   PLT 282  --     Basic Metabolic Panel: Recent Labs  Lab 04/12/21 1706  04/12/21 1720  NA 125* 127*  K 4.6 4.6  CL 94* 94*  CO2 22  --   GLUCOSE 286* 281*  BUN 18 16  CREATININE 1.42* 1.30*  CALCIUM 8.9  --     GFR: Estimated Creatinine Clearance: 83.3 mL/min (A) (by C-G formula based on SCr of 1.3 mg/dL (H)).  Liver Function Tests: Recent Labs  Lab 04/12/21 1706  AST 22  ALT 10  ALKPHOS 72  BILITOT 0.6  PROT 8.1  ALBUMIN 3.1*   No results for input(s): LIPASE, AMYLASE in the last 168 hours. Recent Labs  Lab 04/12/21 1706  AMMONIA 20    Coagulation Profile: Recent Labs  Lab 04/12/21 1706  INR 1.2    Cardiac Enzymes: No results for input(s): CKTOTAL, CKMB, CKMBINDEX, TROPONINI in the last 168 hours.  BNP (last 3 results) No results for input(s): PROBNP in the last 8760 hours.  HbA1C: No results for input(s): HGBA1C in the last 72 hours.  CBG: Recent Labs  Lab 04/13/21 1238  GLUCAP 233*    Lipid Profile: No results for input(s): CHOL, HDL, LDLCALC, TRIG, CHOLHDL, LDLDIRECT in the last 72 hours.  Thyroid Function Tests: No results for input(s): TSH, T4TOTAL, FREET4, T3FREE, THYROIDAB in the last 72 hours.  Anemia Panel: No results for input(s):  VITAMINB12, FOLATE, FERRITIN, TIBC, IRON, RETICCTPCT in the last 72 hours.  Urine analysis:    Component Value Date/Time   COLORURINE YELLOW 04/13/2021 0000   APPEARANCEUR CLOUDY (A) 04/13/2021 0000   LABSPEC 1.012 04/13/2021 0000   PHURINE 6.0 04/13/2021 0000   GLUCOSEU NEGATIVE 04/13/2021 0000   HGBUR LARGE (A) 04/13/2021 0000   BILIRUBINUR NEGATIVE 04/13/2021 0000   BILIRUBINUR Neg 07/28/2014 1307   KETONESUR NEGATIVE 04/13/2021 0000   PROTEINUR 100 (A) 04/13/2021 0000   UROBILINOGEN negative 07/28/2014 1307   UROBILINOGEN 0.2 12/08/2012 1252   NITRITE NEGATIVE 04/13/2021 0000   LEUKOCYTESUR LARGE (A) 04/13/2021 0000     Radiological Exams on Admission: CT HEAD WO CONTRAST  Result Date: 04/12/2021 CLINICAL DATA:  Neuro deficit. EXAM: CT HEAD WITHOUT CONTRAST TECHNIQUE: Contiguous axial images were obtained from the base of the skull through the vertex without intravenous contrast. COMPARISON:  None. FINDINGS: Brain: No evidence of acute infarction, hemorrhage, hydrocephalus, extra-axial collection or mass lesion/mass effect. Bilateral anterior temporal lobe atrophy. Vascular: Atherosclerotic vascular calcification of the carotid siphons. No hyperdense vessel. Skull: Normal. Negative for fracture or focal lesion. Sinuses/Orbits: No acute finding. Other: None. IMPRESSION: 1. No acute intracranial abnormality. Electronically Signed   By: Titus Dubin M.D.   On: 04/12/2021 17:08   MR BRAIN WO CONTRAST  Result Date: 04/13/2021 CLINICAL DATA:  Initial evaluation for neuro deficit, stroke suspected. EXAM: MRI HEAD WITHOUT CONTRAST TECHNIQUE: Multiplanar, multiecho pulse sequences of the brain and surrounding structures were obtained without intravenous contrast. COMPARISON:  Head CT from earlier the same day. FINDINGS: Brain: Examination moderately degraded by motion artifact. Diffuse prominence of the CSF containing spaces compatible with cerebral atrophy, advanced for age. Changes are  most pronounced at the anterior temporal poles bilaterally where there is fairly pronounced atrophy/encephalomalacia (series 8, image 9). Associated T2/FLAIR hyperintensity seen within this region. Finding is nonspecific, and could possibly related to prior trauma. Changes of CADASIL or possibly chronic microvascular ischemic disease could also be considered. No abnormal foci of restricted diffusion to suggest acute or subacute ischemia. Gray-white matter differentiation otherwise maintained. No other areas of chronic cortical infarction. No visible evidence for acute or chronic intracranial hemorrhage.  No mass lesion, mass effect or midline shift. No hydrocephalus or extra-axial fluid collection. Pituitary gland suprasellar region within normal limits. Midline structures intact. Vascular: Right vertebral artery hypoplastic and not well seen. Major intracranial vascular flow voids are otherwise maintained. Skull and upper cervical spine: Craniocervical junction within normal limits. Bone marrow signal intensity diffusely decreased on T1 weighted sequence, nonspecific, but most commonly related to anemia, smoking or obesity. No focal marrow replacing lesion. No scalp soft tissue abnormality. Sinuses/Orbits: Globes orbital soft tissues demonstrate no acute finding. Paranasal sinuses are largely clear. No significant mastoid effusion. Other: None. IMPRESSION: 1. No acute intracranial abnormality. 2. Advanced cerebral atrophy for age, with fairly pronounced atrophy/encephalomalacia involving the anterior temporal poles bilaterally. Finding is nonspecific, and could possibly related to prior trauma or ischemia. Changes of CADASIL could also be considered. Electronically Signed   By: Jeannine Boga M.D.   On: 04/13/2021 00:05   MR Cervical Spine Wo Contrast  Result Date: 04/13/2021 CLINICAL DATA:  Initial evaluation for acute myelopathy. EXAM: MRI CERVICAL SPINE WITHOUT CONTRAST TECHNIQUE: Multiplanar,  multisequence MR imaging of the cervical spine was performed. No intravenous contrast was administered. COMPARISON:  None available. FINDINGS: Alignment: Examination technically limited due to habitus and extensive motion artifact, limiting assessment. Straightening of the normal cervical lordosis. No significant listhesis. Vertebrae: Vertebral body height maintained without acute or chronic fracture. Bone marrow signal intensity diffusely decreased on T1 weighted sequence, nonspecific, but most commonly related to anemia, smoking or obesity. No worrisome osseous lesions. No visible abnormal marrow edema. Cord: Signal intensity within the cervical spinal cord is grossly within normal limits. No obvious cord signal abnormality, although evaluation limited by motion. Posterior Fossa, vertebral arteries, paraspinal tissues: Paraspinous soft tissues within normal limits. Normal flow voids seen within the vertebral arteries bilaterally. Disc levels: C2-C3: Grossly negative. C3-C4: Left paracentral to subarticular disc protrusion indents the left ventral thecal sac (series 8, image 7). Mild flattening of the left hemi cord without definite cord signal changes. Up to moderate left-sided spinal stenosis. Foramina are grossly patent, although evaluation limited by motion. C4-C5: Mild disc bulge. No definite significant spinal stenosis. Foramina grossly patent, although evaluation limited by motion. C5-C6: Disc bulge with endplate and uncovertebral spurring. Secondary flattening of the ventral thecal sac with no more than mild spinal stenosis. No obvious cord impingement. Foramina are grossly patent allowing for motion. C6-C7: Disc bulge with endplate and uncovertebral spurring, slightly asymmetric to the left. No more than mild spinal stenosis. Evaluation of the foramina is severely limited by motion, although there is suspected at least mild spinal stenosis on the left. Right neural foramen is grossly patent. C7-T1: Minimal  disc bulge. No appreciable spinal stenosis. Foramina are grossly patent. IMPRESSION: 1. Technically limited exam due to habitus and extensive motion artifact. 2. No definite acute abnormality within the cervical spine. No obvious cord signal changes on this limited exam. 3. Left paracentral to subarticular disc protrusion at C3-4 with resultant mild flattening of the left hemi cord and suspected moderate left-sided spinal stenosis. 4. Left eccentric disc bulge with uncovertebral spurring at C6-7 with resultant at least mild spinal stenosis. 5. Additional mild noncompressive disc bulging at C4-5, C5-6, and C7-T1 without appreciable stenosis. Foramina are grossly patent, although evaluation is severely limited by motion artifact. Electronically Signed   By: Jeannine Boga M.D.   On: 04/13/2021 00:13   MR THORACIC SPINE WO CONTRAST  Result Date: 04/13/2021 CLINICAL DATA:  Initial evaluation for acute myelopathy. EXAM: MRI THORACIC SPINE  WITHOUT CONTRAST TECHNIQUE: Multiplanar, multisequence MR imaging of the thoracic spine was performed. No intravenous contrast was administered. COMPARISON:  None available. FINDINGS: Alignment: Examination severely degraded by motion and habitus. Additionally, patient was unable to tolerate the full length of the exam. Axial sequences are incomplete, with only a portion of the upper thoracic spine visualized. Dextroscoliosis. Alignment otherwise normal preservation of the normal thoracic kyphosis. No listhesis. Vertebrae: Vertebral body height maintained without acute or chronic fracture. Bone marrow signal intensity diffusely decreased on T1 weighted sequence, nonspecific, but most commonly related to anemia, smoking or obesity. No worrisome osseous lesions. No abnormal marrow edema. Cord: Grossly normal signal intensity throughout the thoracic spinal cord. No visible cord signal changes on this limited study. Paraspinal and other soft tissues: Visualized paraspinous soft  tissues unremarkable. Disc levels: T2-3: Small central to left central disc protrusion indents the ventral thecal sac (series 19, image 7). No significant stenosis. T7-8: Small central disc protrusion with inferior migration (series 19, image 5). Mild spinal stenosis at this level with probable minimal flattening of the ventral cord, but no visible cord signal changes. Foramina remain patent. Otherwise, no other significant disc pathology seen within the thoracic spine. No other visible spinal stenosis. Foramina remain largely patent. IMPRESSION: 1. Technically limited exam due to extensive motion artifact and patient's inability to tolerate the full length of the study. 2. No definite acute abnormality within the thoracic spine. No appreciable cord signal abnormality on this limited exam. 3. Small disc protrusions at T2-3 and T7-8 with resultant mild spinal stenosis at the T7-8 level. No other significant stenosis within the thoracic spine. Electronically Signed   By: Jeannine Boga M.D.   On: 04/13/2021 00:23   MR LUMBAR SPINE WO CONTRAST  Result Date: 04/13/2021 CLINICAL DATA:  Initial evaluation for acute myelopathy. EXAM: MRI LUMBAR SPINE WITHOUT CONTRAST TECHNIQUE: Multiplanar, multisequence MR imaging of the lumbar spine was performed. No intravenous contrast was administered. COMPARISON:  None available. FINDINGS: Segmentation: Examination technically limited by habitus and motion artifact. Standard segmentation. Lowest well-formed disc space labeled the L5-S1 level. Alignment: Exaggeration of the normal lumbar lordosis. Trace anterolisthesis of L4 on L5, likely chronic and facet mediated. Underlying trace scoliosis. Vertebrae: Vertebral body height maintained without acute or chronic fracture. Bone marrow signal intensity diffusely decreased on T1 weighted sequence, nonspecific, but most commonly related to anemia, smoking or obesity. No worrisome osseous lesions. No significant abnormal marrow  edema. Conus medullaris and cauda equina: Conus extends to approximately the L1 level. Conus medullaris and nerve roots of the cauda equina are grossly normal. Paraspinal and other soft tissues: Mild diffuse edema present within the lower posterior paraspinous musculature at L4 through S1. Findings suspected to be related to adjacent facet arthritis, although muscular injury/strain could also have this appearance (series 11, images 6, 12). Paraspinous soft tissues demonstrate no other visible acute abnormality. Partially visualized urinary bladder is moderately distended. Visualized visceral structures otherwise grossly unremarkable. Disc levels: L1-2:  Unremarkable. L2-3: Mild annular disc bulge. No significant spinal stenosis. Foramina remain patent. L3-4: Mild annular disc bulge. Probable mild facet hypertrophy. No significant spinal stenosis. Foramina remain grossly patent. L4-5: Mild disc bulge with disc desiccation. Moderate right worse than left facet hypertrophy. Resultant mild-to-moderate canal with bilateral lateral recess stenosis. Mild left with moderate right L4 foraminal narrowing. L5-S1: Disc desiccation with diffuse disc bulge. Superimposed left subarticular disc protrusion contacts and mildly displaces the descending left S1 nerve root (series 12, image 35). Mild to moderate facet hypertrophy.  Epidural lipomatosis. No significant spinal stenosis. Foramina remain patent. IMPRESSION: 1. Technically limited exam due to habitus and motion artifact. 2. No acute abnormality within the lumbar spine. Grossly normal appearance of the conus medullaris and cauda equina. 3. Disc bulge with facet hypertrophy at L4-5 with resultant mild to moderate canal and bilateral lateral recess stenosis, with moderate right L4 foraminal narrowing. 4. Small left subarticular disc protrusion at L5-S1, contacting and mildly displacing the descending left S1 nerve root. 5. Mild diffuse edema within the lower posterior  paraspinous musculature at L4 through S1, suspected to be related to adjacent facet arthritis, although muscular injury/strain could also have this appearance. Electronically Signed   By: Jeannine Boga M.D.   On: 04/13/2021 00:30    EKG: Independently reviewed. Sinus rhythm. Slight tachycardia.  Assessment/Plan  * Hyponatremia Concern for hypovolemic hyponatremia on admission. Patient recently admitted and found to have hyponatremia at that time with associated AKI. Sodium of 125 on admission which has improved slightly with IV fluids. -BMP in AM -Urine sodium/osmolality -Continue IV fluids  Bilateral leg weakness Chronic issue for several decades. MRI with multilevel degenerative disease. No cord compression noted. Patient will need outpatient neurosurgery follow-up.  Diabetes mellitus without complication ([place]) Hemoglobin A1C of 6% from 03/2021 -SSI while inpatient  Abnormal urinalysis- (present on admission) No associated urinary symptoms. Ceftriaxone given in the ED. -Urine culture  Ovarian mass, left- (present on admission) Noted on recent admission with recommendations to follow-up with gynecology as an outpatient.  Stage 3b chronic kidney disease (CKD) ([place])- (present on admission) Appears to be at baseline. Recent AKI from previous admission appears to be resolved.  Obesity, morbid ([place])- (present on admission) Body mass index is 68.66 kg/m.  Hypertension associated with diabetes ([place])- (present on admission) -Continue home amlodipine     DVT prophylaxis: Lovenox Code Status: DNR (Confirmed with patient at bedside. Sister witnessed) Family Communication: Sister at bedside Disposition Plan: Discharge home vs SNF pending treatment of hyponatremia, PT/OT recommendations Consults called: None Admission status: Observation   Cordelia Poche, MD [facility] 04/13/2021, 1:12 PM

## 2021-04-13 NOTE — Assessment & Plan Note (Signed)
Noted on recent admission with recommendations to follow-up with gynecology as an outpatient.

## 2021-04-13 NOTE — Assessment & Plan Note (Addendum)
No associated urinary symptoms. Ceftriaxone given in the ED. Urine culture with multiple species.

## 2021-04-13 NOTE — ED Notes (Signed)
Patient provided with personal belongings.  Rings and necklace placed in belongings bag per patient request.  Patient made aware that jewelry was placed in specimen to provide locking lid

## 2021-04-13 NOTE — Plan of Care (Signed)
Plan of care discussed with patient. Pt expressed a clear understanding. Questions asked and answered. Pt expressed an understanding

## 2021-04-13 NOTE — Progress Notes (Signed)
PT/ OT Note  Patient Details Name: Makayla [name] MRN: [id number] DOB: 03/12/1966   Noted PT and OT orders and request for pt to be placed on bariatric bed. Will await until pt on approriate bed for safety for mobility assessment, and/or to the unit.   Please call/communicate if you have any needs or concerns before we can assess pt.   Thank you    Clide Dales 04/13/2021, 10:16 AM Acute Rehab Supervisor

## 2021-04-13 NOTE — Assessment & Plan Note (Signed)
Appears to be at baseline. Recent AKI from previous admission appears to be resolved.

## 2021-04-13 NOTE — Assessment & Plan Note (Addendum)
Concern for hypovolemic hyponatremia on admission. Patient recently admitted and found to have hyponatremia at that time with associated AKI. Sodium of 125 on admission which has improved to 130s with IV fluids and is stable.

## 2021-04-13 NOTE — ED Notes (Signed)
Bariatric Bed order placed for patient's comfort

## 2021-04-13 NOTE — Assessment & Plan Note (Addendum)
Chronic issue for several decades. MRI with multilevel degenerative disease. No cord compression noted. Some left sided weakness and numbness associated. Discussed with neurosurgery; no management recommendations at this time. PT/OT recommending SNF. -PT/OT

## 2021-04-13 NOTE — Assessment & Plan Note (Signed)
Hemoglobin A1C of 6% from 03/2021 -SSI while inpatient

## 2021-04-13 NOTE — Assessment & Plan Note (Signed)
-  Continue home amlodipine 

## 2021-04-13 NOTE — ED Notes (Signed)
Patient moved into room to provided hygiene. Patient incontinent of urine.  PureWick changed and skin care provided.  Skin breakdown and sores noted to buttocks.  Red in color and bleeding.  Mepilex pads placed on sacrum and areas of buttocks.  Barrier cream applied to skin folds

## 2021-04-13 NOTE — Assessment & Plan Note (Addendum)
Body mass index is 68.66 kg/m.  Dietitian recommendations (04/16/2021): 1. Multivitamin w/ minerals daily

## 2021-04-14 DIAGNOSIS — R8290 Unspecified abnormal findings in urine: Secondary | ICD-10-CM | POA: Diagnosis not present

## 2021-04-14 DIAGNOSIS — R9089 Other abnormal findings on diagnostic imaging of central nervous system: Secondary | ICD-10-CM | POA: Diagnosis not present

## 2021-04-14 DIAGNOSIS — E871 Hypo-osmolality and hyponatremia: Secondary | ICD-10-CM | POA: Diagnosis not present

## 2021-04-14 DIAGNOSIS — L304 Erythema intertrigo: Secondary | ICD-10-CM

## 2021-04-14 DIAGNOSIS — R29898 Other symptoms and signs involving the musculoskeletal system: Secondary | ICD-10-CM | POA: Diagnosis not present

## 2021-04-14 DIAGNOSIS — E039 Hypothyroidism, unspecified: Secondary | ICD-10-CM

## 2021-04-14 LAB — BASIC METABOLIC PANEL
Anion gap: 10 (ref 5–15)
BUN: 12 mg/dL (ref 6–20)
CO2: 21 mmol/L — ABNORMAL LOW (ref 22–32)
Calcium: 8.5 mg/dL — ABNORMAL LOW (ref 8.9–10.3)
Chloride: 99 mmol/L (ref 98–111)
Creatinine, Ser: 1.05 mg/dL — ABNORMAL HIGH (ref 0.44–1.00)
GFR, Estimated: >60 mL/min (ref >60)
Glucose, Bld: 155 mg/dL — ABNORMAL HIGH (ref 70–99)
Potassium: 4.1 mmol/L (ref 3.5–5.1)
Sodium: 130 mmol/L — ABNORMAL LOW (ref 135–145)

## 2021-04-14 LAB — CBC
HCT: 37.2 % (ref 36.0–46.0)
Hemoglobin: 11.8 g/dL — ABNORMAL LOW (ref 12.0–15.0)
MCH: 27.3 pg (ref 26.0–34.0)
MCHC: 31.7 g/dL (ref 30.0–36.0)
MCV: 85.9 fL (ref 80.0–100.0)
Platelets: 231 10*3/uL (ref 150–400)
RBC: 4.33 MIL/uL (ref 3.87–5.11)
RDW: 14.3 % (ref 11.5–15.5)
WBC: 8.9 10*3/uL (ref 4.0–10.5)
nRBC: 0 % (ref 0.0–0.2)

## 2021-04-14 LAB — OSMOLALITY, URINE: Osmolality, Ur: 329 mOsm/kg (ref 300–900)

## 2021-04-14 LAB — LIPID PANEL
Cholesterol: 73 mg/dL (ref 0–200)
HDL: 31 mg/dL — ABNORMAL LOW (ref >40)
LDL Cholesterol: 31 mg/dL (ref 0–99)
Total CHOL/HDL Ratio: 2.4 RATIO
Triglycerides: 57 mg/dL (ref <150)
VLDL: 11 mg/dL (ref 0–40)

## 2021-04-14 LAB — GLUCOSE, CAPILLARY
Glucose-Capillary: 163 mg/dL — ABNORMAL HIGH (ref 70–99)
Glucose-Capillary: 131 mg/dL — ABNORMAL HIGH (ref 70–99)
Glucose-Capillary: 148 mg/dL — ABNORMAL HIGH (ref 70–99)

## 2021-04-14 LAB — URINE CULTURE

## 2021-04-14 MED ORDER — FLUCONAZOLE 100 MG PO TABS
100.0000 mg | ORAL_TABLET | Freq: Every day | ORAL | Status: DC
  Administered 2021-04-14 – 2021-04-19 (×6): 100 mg via ORAL
  Filled 2021-04-14 (×6): qty 1

## 2021-04-14 MED ORDER — LEVOTHYROXINE SODIUM 50 MCG PO TABS
50.0000 ug | ORAL_TABLET | Freq: Every day | ORAL | Status: DC
  Administered 2021-04-15 – 2021-04-19 (×5): 50 ug via ORAL
  Filled 2021-04-14 (×5): qty 1

## 2021-04-14 MED ORDER — ALLOPURINOL 100 MG PO TABS
100.0000 mg | ORAL_TABLET | Freq: Every day | ORAL | Status: DC
  Administered 2021-04-14 – 2021-04-19 (×6): 100 mg via ORAL
  Filled 2021-04-14 (×6): qty 1

## 2021-04-14 NOTE — Consult Note (Signed)
WOC Nurse Consult Note: Reason for Consult: moisture associated skin damage, specifically irritant contact dermatitis, erythema intertrigo. Also irritant contact dermatitis due to perspiration and urinary incontinence  ICD-10 CM Codes for Irritant Dermatitis  L24A2 - Due to fecal, urinary or dual incontinence L24A9 - Due to friction or contact with other specified body fluids  L30.4  - Erythema intertrigo. Also used for abrasion of the hand, chafing of the skin, dermatitis due to sweating and friction, friction dermatitis, friction eczema, and genital/thigh intertrigo.   Wound type:irritant contact dermatitis due to moisture sources Pressure Injury POA: N/A Measurement: Small, partial thickness lesion noted in left abdominal skin fold measuring 1.6cm x 2.2cm x 0.1cm that is clean, pink and granulating. Partial thickness skin loss the length of pannicular skin fold second ary to moisture, pink, moist with peeling periphery consistent with fungal overgrowth. No exudate. Wound bed:As noted above Drainage (amount, consistency, odor) As noted above Periwound: As noted above Dressing procedure/placement/frequency: Bariatric air bed that was ordered yesterday is not yet in place.  I have added guidance for bilateral pressure redistribution heel boots and skin care to the intertriginous areas of dermatitis using our house antimicrobial wicking textile, InterDry Ag+. Mobility is important and patient will have PT today; unfortunately, they came during my assessment and will have to return. Powder is not recommended as it cakes and becomes a medium for bacterial overgrowth. A PurWick device for urinary incontinence management is in place.  Recommendation: a short course of systemic antifungal (I.e., Diflucan) would enhance the topical care plan.  If you agree and it is not contraindicated, please order.  [facility] nursing team will not follow, but will remain available to this patient, the nursing and medical  teams.  Please re-consult if needed. Thanks, Maudie Flakes, MSN, RN, [place], Arther Abbott  Pager# [phone]

## 2021-04-14 NOTE — Assessment & Plan Note (Signed)
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 Recommend outpatient neurology follow-up.

## 2021-04-14 NOTE — Assessment & Plan Note (Signed)
Continue Synthroid 50 mcg daily.

## 2021-04-14 NOTE — Assessment & Plan Note (Addendum)
Patient evaluated by wound care. Llarge panus with deep skin folds. Moisture associated. -Keep area dry -Fluconazole PO x 7 days (Day 5/7 today)

## 2021-04-14 NOTE — Evaluation (Signed)
Physical Therapy Evaluation Patient Details Name: Makayla [name] MRN: [id number] DOB: May 09, 1965 Today's Date: 04/14/2021  History of Present Illness  Patient is 56 y.o. female recently hospitalized 03/11/21-03/24/21 at [facility] for hypoglycemia and LE spasms and pain in abdominal area who returned home to pt's Mother's house, but returned to hospital on 04/12/21 with weakness, inability to care for self, and inabuility to stand or walk.  Pt found with hyponatremia. PMH significant for DMII, CKD III, HTN, HLD, obesity, neuropathy, cellulitis.   Clinical Impression  Pt admitted with above diagnosis. Pt from home with mother, reports mother unable to assist her due to her current health status and hasn't ambulated since d/c from hospital in December. Pt currently requiring +2 to come to sitting EOB and return to supine. Pt powers to stand with rocking momentum, min A +2 with bil HHA for safety to pivot to recliner and then return to bari bed. VC for foot positioning and sequencing with transfers to improve safety. Pt with resting lateral nystagmus in both eyes, appears upset/tearful at times but unable to articulate why, and doesn't always verbally respond to questions asked by therapists. Pt unable to recall month/year despite being told 3x and only able to determine in hospital with cues. Pt's sister in room at [place], reports pt's mental status has been declining and is requesting SNF due to families inability to care for pt at home. RN notified of pt's cognition, nystagmus, and pain c/o during session. Pt currently with functional limitations due to the deficits listed below (see PT Problem List). Pt will benefit from skilled PT to increase their independence and safety with mobility to allow discharge to the venue listed below.          Recommendations for follow up therapy are one component of a multi-disciplinary discharge planning process, led by the attending physician.  Recommendations may be updated based on  patient status, additional functional criteria and insurance authorization.  Follow Up Recommendations Skilled nursing-short term rehab (<3 hours/day)    Assistance Recommended at Discharge Frequent or constant Supervision/Assistance  Patient can return home with the following       Equipment Recommendations Other (comment) (TBD at next venue)  Recommendations for Other Services       Functional Status Assessment Patient has had a recent decline in their functional status and demonstrates the ability to make significant improvements in function in a reasonable and predictable amount of time.     Precautions / Restrictions Precautions Precautions: Fall Restrictions Weight Bearing Restrictions: No      Mobility  Bed Mobility Overal bed mobility: Needs Assistance Bed Mobility: Supine to Sit;Sit to Supine  Supine to sit: HOB elevated;Mod assist;+2 for physical assistance Sit to supine: Max assist;+2 for physical assistance General bed mobility comments: pt prefers to come into long sitting before swinging BLE over to EOB; max A to lift BLE back into bed and use of bed pad and trendelenburg to assist pt back into center of bed and scoot up    Transfers Overall transfer level: Needs assistance Equipment used: 2 person hand held assist Transfers: Sit to/from Stand;Bed to chair/wheelchair/BSC Sit to Stand: Min assist;+2 physical assistance  Step pivot transfers: Min assist;+2 physical assistance  General transfer comment: rocking momentum to power to stand, therapist on either side to steady pt once standing, able to take minimal steps with facial wincing noted while pivoting    Ambulation/Gait  Stairs            Wheelchair Mobility    Modified Rankin (Stroke Patients Only)       Balance Overall balance assessment: Needs assistance Sitting-balance support: Feet supported Sitting balance-Leahy Scale: Fair  Standing balance support: During  functional activity;Bilateral upper extremity supported Standing balance-Leahy Scale: Poor Standing balance comment: 2 person HHA to steady with transfers        Pertinent Vitals/Pain Pain Assessment: Faces Faces Pain Scale: Hurts even more Pain Location: RT foot. Pt also mentioning diffuse pain around buttocks/LEs Pain Descriptors / Indicators: Grimacing Pain Intervention(s): Limited activity within patient's tolerance;Monitored during session;Repositioned    Home Living Family/patient expects to be discharged to:: Skilled nursing facility Living Arrangements: Parent;Other (Comment) (Pt reports that she has been staying at her Mother's house.) Available Help at Discharge: Family;Friend(s) (Pt's sister to room and requesting SNF)    Prior Function    Mobility Comments: pt reports since d/c from hospital, unable to ambulate around home       Hand Dominance        Extremity/Trunk Assessment   Upper Extremity Assessment Upper Extremity Assessment: Defer to OT evaluation RUE Deficits / Details: Generalized weakness. LUE Deficits / Details: ~3/5 proximally, ~3+/5 distally.    Lower Extremity Assessment Lower Extremity Assessment: Generalized weakness (strength grossly 3+/5)    Cervical / Trunk Assessment Cervical / Trunk Assessment: Other exceptions Cervical / Trunk Exceptions: body habitus  Communication      Cognition Arousal/Alertness: Awake/alert Behavior During Therapy: Anxious;Restless Overall Cognitive Status: Impaired/Different from baseline Area of Impairment: Orientation;Following commands;Awareness;Safety/judgement;Problem solving;Attention  Orientation Level: Place;Time;Situation Current Attention Level: Sustained  Following Commands: Follows one step commands inconsistently  Awareness: Intellectual Problem Solving: Slow processing;Decreased initiation;Difficulty sequencing;Requires verbal cues;Requires tactile cues General Comments: Pt was able to select  "hospital" from 3 choices during orientation questions. Pt often with distressed look to face but unable to verbalize situation or why distressed. Does not verbally respond to all questions. Pt unable to recall month/year despite reorienting 3x.        General Comments      Exercises     Assessment/Plan    PT Assessment Patient needs continued PT services  PT Problem List Decreased strength;Decreased activity tolerance;Decreased balance;Decreased mobility;Decreased cognition;Decreased knowledge of use of DME;Decreased safety awareness;Obesity;Decreased skin integrity;Pain       PT Treatment Interventions DME instruction;Gait training;Functional mobility training;Therapeutic activities;Therapeutic exercise;Balance training;Cognitive remediation;Patient/family education    PT Goals (Current goals can be found in the Care Plan section)  Acute Rehab PT Goals Patient Stated Goal: For pt to go to skilled nursing PT Goal Formulation: With family Time For Goal Achievement: 04/28/21 Potential to Achieve Goals: Good    Frequency Min 2X/week     Co-evaluation PT/OT/SLP Co-Evaluation/Treatment: Yes Reason for Co-Treatment: For patient/therapist safety;To address functional/ADL transfers;Complexity of the patient's impairments (multi-system involvement) PT goals addressed during session: Mobility/safety with mobility;Balance OT goals addressed during session: ADL's and self-care       AM-PAC PT "6 Clicks" Mobility  Outcome Measure Help needed turning from your back to your side while in a flat bed without using bedrails?: Total Help needed moving from lying on your back to sitting on the side of a flat bed without using bedrails?: Total Help needed moving to and from a bed to a chair (including a wheelchair)?: Total Help needed standing up from a chair using your arms (e.g., wheelchair or bedside chair)?: Total Help needed to walk in hospital room?: Total Help  needed climbing 3-5  steps with a railing? : Total 6 Click Score: 6    End of Session   Activity Tolerance: Patient tolerated treatment well Patient left: in bed;with call bell/phone within reach;with family/visitor present Nurse Communication: Mobility status;Other (comment) (needs bari chair, behavior during eval, nystagmus) PT Visit Diagnosis: Muscle weakness (generalized) (M62.81);Difficulty in walking, not elsewhere classified (R26.2);Other abnormalities of gait and mobility (R26.89)    Time: 4314-2767 PT Time Calculation (min) (ACUTE ONLY): 36 min   Charges:   PT Evaluation $PT Eval Moderate Complexity: 1 Mod          Tori Quin Mcpherson PT, DPT 04/14/21, 1:58 PM

## 2021-04-14 NOTE — Hospital Course (Addendum)
Makayla [name] is a 56 y.o. female with medical history significant of recent cellulitis, CKD stage IIIb, hypertension, neuropathy, diabetes mellitus, obesity. Patient presented at the suggestion of her outpatient practitioner Arthur Holms at [facility]) secondary to ongoing weakness and inability to be cared for at home. She was found to have mild hyponatremia which has been improved with IV fluids. Plan for discharge to SNF.

## 2021-04-14 NOTE — Evaluation (Signed)
Occupational Therapy Evaluation Patient Details Name: Makayla [name] MRN: [id number] DOB: Feb 16, 1966 Today's Date: 04/14/2021   History of Present Illness Patient is 56 y.o. female recently hospitalized at [facility] for hypoglycemia and LE spasms and pain in abdominal area who returned home to pt's Mother's house, but returned to hospital on 04/12/21 with weakness, inability to care for self, and inabuility to stand or walk.  Pt found with hyponatremia. PMH significant for DMII, CKD III, HTN, HLD, obesity, neuropathy, cellulitis.   Clinical Impression   Patient is currently requiring assistance with ADLs including Total assist with Lower body ADLs, minimal to moderate assist with Upper body ADLs,  as well as  moderate to maximum assist of 2 people with bed mobility and Minimal  assist of 2 people with functional transfers to/from EOB and recliner but unsteady and unsafe due to cognitive deficits.  Current level of function is below patient's typical baseline.  During this evaluation, patient was limited by generalized weakness, impaired activity tolerance, cognitive deficits, questionable vision with eye nystagmus, pain and body habitus, all of which has the potential to impact patient's safety and independence during functional mobility, as well as performance for ADLs.  Patient lives with her Mother,  who is unable to provide the assistance that pt currently requires.  Patient demonstrates good rehab potential, and should benefit from continued skilled occupational therapy services while in acute care to maximize safety, independence and quality of life at home.  Continued occupational therapy services in an inpatient rehab setting prior to return home is recommended.  ?    Recommendations for follow up therapy are one component of a multi-disciplinary discharge planning process, led by the attending physician.  Recommendations may be updated based on patient status, additional functional criteria and insurance  authorization.   Follow Up Recommendations  Skilled nursing-short term rehab (<3 hours/day)    Assistance Recommended at Discharge Frequent or constant Supervision/Assistance  Patient can return home with the following Two people to help with walking and/or transfers;Two people to help with bathing/dressing/bathroom    Functional Status Assessment  Patient has had a recent decline in their functional status and demonstrates the ability to make significant improvements in function in a reasonable and predictable amount of time.  Equipment Recommendations   (Bariatric BSC. Will need to confirm bariatric RW at home)    Recommendations for Other Services       Precautions / Restrictions Precautions Precautions: Fall Restrictions Weight Bearing Restrictions: No      Mobility Bed Mobility Overal bed mobility: Needs Assistance Bed Mobility: Supine to Sit;Sit to Supine     Supine to sit: HOB elevated;Mod assist;+2 for physical assistance Sit to supine: Max assist;+2 for physical assistance        Transfers Overall transfer level: Needs assistance   Transfers: Sit to/from Stand Sit to Stand: Min assist;+2 physical assistance                  Balance Overall balance assessment: Needs assistance   Sitting balance-Leahy Scale: Fair     Standing balance support: During functional activity;Bilateral upper extremity supported Standing balance-Leahy Scale: Poor                             ADL either performed or assessed with clinical judgement   ADL Overall ADL's : Needs assistance/impaired Eating/Feeding: Minimal assistance;Bed level Eating/Feeding Details (indicate cue type and reason): Recliner in room not appropraite for pt's needs. Grooming:  Wash/dry face;Sitting;Minimal assistance Grooming Details (indicate cue type and reason): Cues to initiate. Min As to complete and to clear tear ducts. Upper Body Bathing: Moderate assistance;Sitting;Cueing for  safety;Cueing for sequencing   Lower Body Bathing: Total assistance;+2 for physical assistance;Bed level   Upper Body Dressing : Moderate assistance;Sitting;Cueing for sequencing;Cueing for safety   Lower Body Dressing: Total assistance;Bed level;+2 for physical assistance Lower Body Dressing Details (indicate cue type and reason): To don socks. 2nd person to help lift heel off bed Toilet Transfer: +2 for physical assistance;Minimal assistance;Cueing for safety;Cueing for sequencing Toilet Transfer Details (indicate cue type and reason): Simulated elevated EOB->recliner->edge of new Bariatric bed.  Pt required significantly increased time to rise each time with cues/encouragement and HHA of 2 people. Toileting- Clothing Manipulation and Hygiene: Total assistance;Bed level;Sitting/lateral lean       Functional mobility during ADLs: Minimal assistance;Maximal assistance;+2 for physical assistance;+2 for safety/equipment       Vision   Vision Assessment?: Vision impaired- to be further tested in functional context Additional Comments: Bilateral continuous nystagmus. Pt too confused for thorough vision exam this visit.     Perception Perception Perception: Within Functional Limits   Praxis Praxis Praxis: Intact    Pertinent Vitals/Pain Pain Assessment: Faces Faces Pain Scale: Hurts even more Pain Location: RT foot. Pt also mentioning diffuse pain around buttocks/LEs Pain Intervention(s): Limited activity within patient's tolerance;Monitored during session;Repositioned     Hand Dominance     Extremity/Trunk Assessment Upper Extremity Assessment Upper Extremity Assessment: RUE deficits/detail RUE Deficits / Details: Generalized weakness. LUE Deficits / Details: ~3/5 proximally, ~3+/5 distally.       Cervical / Trunk Assessment Cervical / Trunk Assessment: Other exceptions Cervical / Trunk Exceptions: body habitus   Communication     Cognition Arousal/Alertness:  Awake/alert Behavior During Therapy: Anxious;Restless Overall Cognitive Status: Impaired/Different from baseline Area of Impairment: Orientation;Following commands;Awareness;Safety/judgement;Problem solving;Attention                 Orientation Level: Place;Time;Situation Current Attention Level: Sustained   Following Commands: Follows one step commands inconsistently   Awareness: Intellectual Problem Solving: Slow processing;Decreased initiation;Difficulty sequencing;Requires verbal cues;Requires tactile cues General Comments: Pt was able to select "hospital" from 3 choices during orientation questions. Pt often with distressed look to face but unable to verbalize situation or why distressed. Does not verbally respond to all questions.     General Comments       Exercises     Shoulder Instructions      Home Living Family/patient expects to be discharged to:: Skilled nursing facility Living Arrangements: Parent;Other (Comment) (Pt reports that she has been staying at her Mother's house.) Available Help at Discharge: Family;Friend(s) (Pt's sister to room and requesting SNF)                                    Prior Functioning/Environment                          OT Problem List: Obesity;Impaired balance (sitting and/or standing);Decreased knowledge of use of DME or AE;Decreased knowledge of precautions;Decreased activity tolerance;Decreased safety awareness;Decreased cognition;Pain;Cardiopulmonary status limiting activity;Decreased strength      OT Treatment/Interventions: Self-care/ADL training;Therapeutic exercise;Therapeutic activities;Cognitive remediation/compensation;Visual/perceptual remediation/compensation;Patient/family education;DME and/or AE instruction;Balance training    OT Goals(Current goals can be found in the care plan section) Acute Rehab OT Goals Patient Stated Goal: For pt to go to  skilled nursing OT Goal Formulation: With  family Time For Goal Achievement: 04/28/21 Potential to Achieve Goals: Good ADL Goals Pt Will Perform Upper Body Dressing: with set-up;with min guard assist;sitting Pt Will Perform Lower Body Dressing: with min assist;with adaptive equipment;sit to/from stand;sitting/lateral leans Pt Will Transfer to Toilet: ambulating;with min guard assist Pt/caregiver will Perform Home Exercise Program: Both right and left upper extremity;Increased strength;With Supervision;With minimal assist (Seated unsupported if able.) Additional ADL Goal #1: Pt will demonstrate improved mentation by following 1-step instructions consistently and 2-step instructions at least 50% of time.  OT Frequency: Min 2X/week    Co-evaluation PT/OT/SLP Co-Evaluation/Treatment: Yes Reason for Co-Treatment: For patient/therapist safety;To address functional/ADL transfers;Complexity of the patient's impairments (multi-system involvement) PT goals addressed during session: Mobility/safety with mobility OT goals addressed during session: ADL's and self-care      AM-PAC OT "6 Clicks" Daily Activity     Outcome Measure Help from another person eating meals?: A Little Help from another person taking care of personal grooming?: A Little Help from another person toileting, which includes using toliet, bedpan, or urinal?: Total Help from another person bathing (including washing, rinsing, drying)?: A Lot Help from another person to put on and taking off regular upper body clothing?: A Lot Help from another person to put on and taking off regular lower body clothing?: Total 6 Click Score: 12   End of Session Equipment Utilized During Treatment: Gait belt Nurse Communication: Mobility status;Other (comment) (CNA assisting with bed switch)  Activity Tolerance: Patient limited by pain Patient left: in bed;with call bell/phone within reach;with family/visitor present  OT Visit Diagnosis: Unsteadiness on feet (R26.81);Other symptoms and  signs involving cognitive function;Pain;History of falling (Z91.81);Muscle weakness (generalized) (M62.81) Pain - Right/Left: Right Pain - part of body: Ankle and joints of foot                Time: 9381-8299 OT Time Calculation (min): 40 min Charges:  OT General Charges $OT Visit: 1 Visit OT Evaluation $OT Eval Moderate Complexity: 1 Mod  Seaborn Nakama, OT Acute Rehab Services Office: [phone] 04/14/2021  Julien Girt 04/14/2021, 12:59 PM

## 2021-04-14 NOTE — Progress Notes (Signed)
PROGRESS NOTE    Makayla [name]  VEL:[id number] DOB: [birth year] DOA: 04/12/2021 PCP: Vonna Drafts, FNP   Brief Narrative: Makayla [name] is a 56 y.o. female with medical history significant of recent cellulitis, CKD stage IIIb, hypertension, neuropathy, diabetes mellitus, obesity. Patient presented at the suggestion of her outpatient practitioner secondary to ongoing weakness and inability to be cared for at home. She was found to have mild hyponatremia which has been improved with IV fluids. PT/OT pending for disposition planning.   Assessment & Plan:   * Hyponatremia Concern for hypovolemic hyponatremia on admission. Patient recently admitted and found to have hyponatremia at that time with associated AKI. Sodium of 125 on admission which has improved to 130 with IV fluids. -Discontinue IV fluids  Bilateral leg weakness Chronic issue for several decades. MRI with multilevel degenerative disease. No cord compression noted. Patient will need outpatient neurosurgery follow-up. -PT/OT recommendations  Diabetes mellitus without complication ([place]) Hemoglobin A1C of 6% from 03/2021 -SSI while inpatient  Abnormal brain MRI Atrophy greater than expected for age. Recommend outpatient neurology follow-up.  Abnormal urinalysis- (present on admission) No associated urinary symptoms. Ceftriaxone given in the ED. -Urine culture pending -Hold Ceftriaxone  Ovarian mass, left- (present on admission) Noted on recent admission with recommendations to follow-up with gynecology as an outpatient.  Stage 3b chronic kidney disease (CKD) ([place])- (present on admission) Appears to be at baseline. Recent AKI from previous admission appears to be resolved.  Obesity, morbid ([place])- (present on admission) Body mass index is 68.66 kg/m.  Hypertension associated with diabetes ([place])- (present on admission) -Continue home amlodipine  Erythema intertrigo Patient evaluated by wound care. Extremely large  panus with deep skin folds. Moisture associated. -Keep area dry -Fluconazole PO x 7 days    DVT prophylaxis: Lovenox Code Status:   Code Status: DNR Family Communication: None at bedside Disposition Plan: Discharge to SNF pending PT/OT recommendations in addition to bed availability   Consultants:  None  Procedures:  None  Antimicrobials: Ceftriaxone IV x1    Subjective: No concerns this morning.  Objective: Vitals:   04/13/21 2205 04/14/21 0224 04/14/21 0417 04/14/21 1216  BP: (!) 143/95 (!) 143/73 126/68   Pulse: 96 (!) 103 97 (!) 103  Resp: 20 18 18    Temp: 98.3 F (36.8 C) 98.6 F (37 C) 97.9 F (36.6 C)   TempSrc: Oral Oral    SpO2: 100% 98% 97%   Weight: (!) 158.8 kg     Height: 5\' 4"  (1.626 m)       Intake/Output Summary (Last 24 hours) at 04/14/2021 1310 Last data filed at 04/14/2021 0200 Gross per 24 hour  Intake --  Output 800 ml  Net -800 ml   Filed Weights   04/12/21 1440 04/13/21 2205  Weight: (!) 181.4 kg (!) 158.8 kg    Examination:  General exam: Appears calm and comfortable Respiratory system: Diminished/clear to auscultation. Respiratory effort normal. Cardiovascular system: S1 & S2 heard, RRR. No murmurs, rubs, gallops or clicks. Gastrointestinal system: Abdomen is nondistended, soft and nontender. No organomegaly or masses felt. Normal bowel sounds heard. Central nervous system: Alert and oriented. No focal neurological deficits. Musculoskeletal: No edema. No calf tenderness Skin: No cyanosis. No rashes Psychiatry: Judgement and insight appear normal. Mood & affect appropriate.     Data Reviewed: I have personally reviewed following labs and imaging studies  CBC Lab Results  Component Value Date   WBC 8.9 04/14/2021   RBC 4.33 04/14/2021   HGB 11.8 (  L) 04/14/2021   HCT 37.2 04/14/2021   MCV 85.9 04/14/2021   MCH 27.3 04/14/2021   PLT 231 04/14/2021   MCHC 31.7 04/14/2021   RDW 14.3 04/14/2021   LYMPHSABS 1.6 04/12/2021    MONOABS 1.5 (H) 04/12/2021   EOSABS 0.0 04/12/2021   BASOSABS 0.0 19/50/9326     Last metabolic panel Lab Results  Component Value Date   NA 130 (L) 04/14/2021   K 4.1 04/14/2021   CL 99 04/14/2021   CO2 21 (L) 04/14/2021   BUN 12 04/14/2021   CREATININE 1.05 (H) 04/14/2021   GLUCOSE 155 (H) 04/14/2021   GFRNONAA >60 04/14/2021   GFRAA 27 (L) 12/24/2019   CALCIUM 8.5 (L) 04/14/2021   PHOS 4.1 12/28/2008   PROT 8.1 04/12/2021   ALBUMIN 3.1 (L) 04/12/2021   BILITOT 0.6 04/12/2021   ALKPHOS 72 04/12/2021   AST 22 04/12/2021   ALT 10 04/12/2021   ANIONGAP 10 04/14/2021    CBG (last 3)  Recent Labs    04/13/21 1238 04/13/21 1855 04/14/21 1105  GLUCAP 233* 170* 163*     GFR: Estimated Creatinine Clearance: 94.2 mL/min (A) (by C-G formula based on SCr of 1.05 mg/dL (H)).  Coagulation Profile: Recent Labs  Lab 04/12/21 1706  INR 1.2    Recent Results (from the past 240 hour(s))  Resp Panel by RT-PCR (Flu A&B, Covid) Nasopharyngeal Swab     Status: None   Collection Time: 04/04/21  8:02 PM   Specimen: Nasopharyngeal Swab; Nasopharyngeal(NP) swabs in vial transport medium  Result Value Ref Range Status   SARS Coronavirus 2 by RT PCR NEGATIVE NEGATIVE Final    Comment: (NOTE) SARS-CoV-2 target nucleic acids are NOT DETECTED.  The SARS-CoV-2 RNA is generally detectable in upper respiratory specimens during the acute phase of infection. The lowest concentration of SARS-CoV-2 viral copies this assay can detect is 138 copies/mL. A negative result does not preclude SARS-Cov-2 infection and should not be used as the sole basis for treatment or other patient management decisions. A negative result may occur with  improper specimen collection/handling, submission of specimen other than nasopharyngeal swab, presence of viral mutation(s) within the areas targeted by this assay, and inadequate number of viral copies(<138 copies/mL). A negative result must be combined  with clinical observations, patient history, and epidemiological information. The expected result is Negative.  Fact Sheet for Patients:  EntrepreneurPulse.com.au  Fact Sheet for Healthcare Providers:  IncredibleEmployment.be  This test is no t yet approved or cleared by the [place] FDA and  has been authorized for detection and/or diagnosis of SARS-CoV-2 by FDA under an Emergency Use Authorization (EUA). This EUA will remain  in effect (meaning this test can be used) for the duration of the COVID-19 declaration under Section 564(b)(1) of the Act, 21 U.S.C.section 360bbb-3(b)(1), unless the authorization is terminated  or revoked sooner.       Influenza A by PCR NEGATIVE NEGATIVE Final   Influenza B by PCR NEGATIVE NEGATIVE Final    Comment: (NOTE) The Xpert Xpress SARS-CoV-2/FLU/RSV plus assay is intended as an aid in the diagnosis of influenza from Nasopharyngeal swab specimens and should not be used as a sole basis for treatment. Nasal washings and aspirates are unacceptable for Xpert Xpress SARS-CoV-2/FLU/RSV testing.  Fact Sheet for Patients: EntrepreneurPulse.com.au  Fact Sheet for Healthcare Providers: IncredibleEmployment.be  This test is not yet approved or cleared by the [place] FDA and has been authorized for detection and/or diagnosis of SARS-CoV-2 by FDA under  an Emergency Use Authorization (EUA). This EUA will remain in effect (meaning this test can be used) for the duration of the COVID-19 declaration under Section 564(b)(1) of the Act, 21 U.S.C. section 360bbb-3(b)(1), unless the authorization is terminated or revoked.  Performed at [facility], [place] [address], [place], [place] [address]   Resp Panel by RT-PCR (Flu A&B, Covid)     Status: None   Collection Time: 04/12/21  5:06 PM   Specimen: Nasopharyngeal(NP) swabs in vial transport medium  Result  Value Ref Range Status   SARS Coronavirus 2 by RT PCR NEGATIVE NEGATIVE Final    Comment: (NOTE) SARS-CoV-2 target nucleic acids are NOT DETECTED.  The SARS-CoV-2 RNA is generally detectable in upper respiratory specimens during the acute phase of infection. The lowest concentration of SARS-CoV-2 viral copies this assay can detect is 138 copies/mL. A negative result does not preclude SARS-Cov-2 infection and should not be used as the sole basis for treatment or other patient management decisions. A negative result may occur with  improper specimen collection/handling, submission of specimen other than nasopharyngeal swab, presence of viral mutation(s) within the areas targeted by this assay, and inadequate number of viral copies(<138 copies/mL). A negative result must be combined with clinical observations, patient history, and epidemiological information. The expected result is Negative.  Fact Sheet for Patients:  EntrepreneurPulse.com.au  Fact Sheet for Healthcare Providers:  IncredibleEmployment.be  This test is no t yet approved or cleared by the [place] FDA and  has been authorized for detection and/or diagnosis of SARS-CoV-2 by FDA under an Emergency Use Authorization (EUA). This EUA will remain  in effect (meaning this test can be used) for the duration of the COVID-19 declaration under Section 564(b)(1) of the Act, 21 U.S.C.section 360bbb-3(b)(1), unless the authorization is terminated  or revoked sooner.       Influenza A by PCR NEGATIVE NEGATIVE Final   Influenza B by PCR NEGATIVE NEGATIVE Final    Comment: (NOTE) The Xpert Xpress SARS-CoV-2/FLU/RSV plus assay is intended as an aid in the diagnosis of influenza from Nasopharyngeal swab specimens and should not be used as a sole basis for treatment. Nasal washings and aspirates are unacceptable for Xpert Xpress SARS-CoV-2/FLU/RSV testing.  Fact Sheet for  Patients: EntrepreneurPulse.com.au  Fact Sheet for Healthcare Providers: IncredibleEmployment.be  This test is not yet approved or cleared by the [place] FDA and has been authorized for detection and/or diagnosis of SARS-CoV-2 by FDA under an Emergency Use Authorization (EUA). This EUA will remain in effect (meaning this test can be used) for the duration of the COVID-19 declaration under Section 564(b)(1) of the Act, 21 U.S.C. section 360bbb-3(b)(1), unless the authorization is terminated or revoked.  Performed at [facility], [place] [address], [place], [place] [address]   Blood culture (routine x 2)     Status: None (Preliminary result)   Collection Time: 04/12/21  5:06 PM   Specimen: Right Antecubital; Blood  Result Value Ref Range Status   Specimen Description   Final    RIGHT ANTECUBITAL BLOOD Performed at [facility] [address], [place], [place] [address]    Special Requests   Final    Blood Culture adequate volume BOTTLES DRAWN AEROBIC AND ANAEROBIC Performed at [facility] [address], [place], [place] [address]    Culture   Final    NO GROWTH 2 DAYS Performed at [facility] [address], [place], [place] [address]  Report Status PENDING  Incomplete  Blood culture (routine x 2)     Status: None (Preliminary result)   Collection Time: 04/12/21  5:06 PM   Specimen: BLOOD RIGHT HAND  Result Value Ref Range Status   Specimen Description   Final    BLOOD RIGHT HAND BLOOD Performed at [facility], [place] [address], [place], [place] [address]    Special Requests   Final    Blood Culture adequate volume BOTTLES DRAWN AEROBIC AND ANAEROBIC Performed at [facility] [address], [place], [place] [address]    Culture   Final    NO GROWTH 2 DAYS Performed at [facility] [address] [place], [place]  [address]    Report Status PENDING  Incomplete  Urine Culture     Status: Abnormal   Collection Time: 04/13/21 12:00 AM   Specimen: Urine, Clean Catch  Result Value Ref Range Status   Specimen Description   Final    URINE, CLEAN CATCH Performed at [facility], [place] [address], [place], [place] [address]    Special Requests   Final    NONE Performed at [facility], [place] [address], [place], [place] [address]    Culture MULTIPLE SPECIES PRESENT, SUGGEST RECOLLECTION (A)  Final   Report Status 04/14/2021 FINAL  Final        Radiology Studies: CT HEAD WO CONTRAST  Result Date: 04/12/2021 CLINICAL DATA:  Neuro deficit. EXAM: CT HEAD WITHOUT CONTRAST TECHNIQUE: Contiguous axial images were obtained from the base of the skull through the vertex without intravenous contrast. COMPARISON:  None. FINDINGS: Brain: No evidence of acute infarction, hemorrhage, hydrocephalus, extra-axial collection or mass lesion/mass effect. Bilateral anterior temporal lobe atrophy. Vascular: Atherosclerotic vascular calcification of the carotid siphons. No hyperdense vessel. Skull: Normal. Negative for fracture or focal lesion. Sinuses/Orbits: No acute finding. Other: None. IMPRESSION: 1. No acute intracranial abnormality. Electronically Signed   By: Titus Dubin M.D.   On: 04/12/2021 17:08   MR BRAIN WO CONTRAST  Result Date: 04/13/2021 CLINICAL DATA:  Initial evaluation for neuro deficit, stroke suspected. EXAM: MRI HEAD WITHOUT CONTRAST TECHNIQUE: Multiplanar, multiecho pulse sequences of the brain and surrounding structures were obtained without intravenous contrast. COMPARISON:  Head CT from earlier the same day. FINDINGS: Brain: Examination moderately degraded by motion artifact. Diffuse prominence of the CSF containing spaces compatible with cerebral atrophy, advanced for age. Changes are most pronounced at the anterior temporal poles bilaterally where there is fairly  pronounced atrophy/encephalomalacia (series 8, image 9). Associated T2/FLAIR hyperintensity seen within this region. Finding is nonspecific, and could possibly related to prior trauma. Changes of CADASIL or possibly chronic microvascular ischemic disease could also be considered. No abnormal foci of restricted diffusion to suggest acute or subacute ischemia. Gray-white matter differentiation otherwise maintained. No other areas of chronic cortical infarction. No visible evidence for acute or chronic intracranial hemorrhage. No mass lesion, mass effect or midline shift. No hydrocephalus or extra-axial fluid collection. Pituitary gland suprasellar region within normal limits. Midline structures intact. Vascular: Right vertebral artery hypoplastic and not well seen. Major intracranial vascular flow voids are otherwise maintained. Skull and upper cervical spine: Craniocervical junction within normal limits. Bone marrow signal intensity diffusely decreased on T1 weighted sequence, nonspecific, but most commonly related to anemia, smoking or obesity. No focal marrow replacing lesion. No scalp soft tissue abnormality. Sinuses/Orbits: Globes orbital soft tissues demonstrate no acute finding. Paranasal sinuses are largely clear. No significant  mastoid effusion. Other: None. IMPRESSION: 1. No acute intracranial abnormality. 2. Advanced cerebral atrophy for age, with fairly pronounced atrophy/encephalomalacia involving the anterior temporal poles bilaterally. Finding is nonspecific, and could possibly related to prior trauma or ischemia. Changes of CADASIL could also be considered. Electronically Signed   By: Jeannine Boga M.D.   On: 04/13/2021 00:05   MR Cervical Spine Wo Contrast  Result Date: 04/13/2021 CLINICAL DATA:  Initial evaluation for acute myelopathy. EXAM: MRI CERVICAL SPINE WITHOUT CONTRAST TECHNIQUE: Multiplanar, multisequence MR imaging of the cervical spine was performed. No intravenous contrast was  administered. COMPARISON:  None available. FINDINGS: Alignment: Examination technically limited due to habitus and extensive motion artifact, limiting assessment. Straightening of the normal cervical lordosis. No significant listhesis. Vertebrae: Vertebral body height maintained without acute or chronic fracture. Bone marrow signal intensity diffusely decreased on T1 weighted sequence, nonspecific, but most commonly related to anemia, smoking or obesity. No worrisome osseous lesions. No visible abnormal marrow edema. Cord: Signal intensity within the cervical spinal cord is grossly within normal limits. No obvious cord signal abnormality, although evaluation limited by motion. Posterior Fossa, vertebral arteries, paraspinal tissues: Paraspinous soft tissues within normal limits. Normal flow voids seen within the vertebral arteries bilaterally. Disc levels: C2-C3: Grossly negative. C3-C4: Left paracentral to subarticular disc protrusion indents the left ventral thecal sac (series 8, image 7). Mild flattening of the left hemi cord without definite cord signal changes. Up to moderate left-sided spinal stenosis. Foramina are grossly patent, although evaluation limited by motion. C4-C5: Mild disc bulge. No definite significant spinal stenosis. Foramina grossly patent, although evaluation limited by motion. C5-C6: Disc bulge with endplate and uncovertebral spurring. Secondary flattening of the ventral thecal sac with no more than mild spinal stenosis. No obvious cord impingement. Foramina are grossly patent allowing for motion. C6-C7: Disc bulge with endplate and uncovertebral spurring, slightly asymmetric to the left. No more than mild spinal stenosis. Evaluation of the foramina is severely limited by motion, although there is suspected at least mild spinal stenosis on the left. Right neural foramen is grossly patent. C7-T1: Minimal disc bulge. No appreciable spinal stenosis. Foramina are grossly patent. IMPRESSION: 1.  Technically limited exam due to habitus and extensive motion artifact. 2. No definite acute abnormality within the cervical spine. No obvious cord signal changes on this limited exam. 3. Left paracentral to subarticular disc protrusion at C3-4 with resultant mild flattening of the left hemi cord and suspected moderate left-sided spinal stenosis. 4. Left eccentric disc bulge with uncovertebral spurring at C6-7 with resultant at least mild spinal stenosis. 5. Additional mild noncompressive disc bulging at C4-5, C5-6, and C7-T1 without appreciable stenosis. Foramina are grossly patent, although evaluation is severely limited by motion artifact. Electronically Signed   By: Jeannine Boga M.D.   On: 04/13/2021 00:13   MR THORACIC SPINE WO CONTRAST  Result Date: 04/13/2021 CLINICAL DATA:  Initial evaluation for acute myelopathy. EXAM: MRI THORACIC SPINE WITHOUT CONTRAST TECHNIQUE: Multiplanar, multisequence MR imaging of the thoracic spine was performed. No intravenous contrast was administered. COMPARISON:  None available. FINDINGS: Alignment: Examination severely degraded by motion and habitus. Additionally, patient was unable to tolerate the full length of the exam. Axial sequences are incomplete, with only a portion of the upper thoracic spine visualized. Dextroscoliosis. Alignment otherwise normal preservation of the normal thoracic kyphosis. No listhesis. Vertebrae: Vertebral body height maintained without acute or chronic fracture. Bone marrow signal intensity diffusely decreased on T1 weighted sequence, nonspecific, but most commonly related to anemia, smoking  or obesity. No worrisome osseous lesions. No abnormal marrow edema. Cord: Grossly normal signal intensity throughout the thoracic spinal cord. No visible cord signal changes on this limited study. Paraspinal and other soft tissues: Visualized paraspinous soft tissues unremarkable. Disc levels: T2-3: Small central to left central disc protrusion  indents the ventral thecal sac (series 19, image 7). No significant stenosis. T7-8: Small central disc protrusion with inferior migration (series 19, image 5). Mild spinal stenosis at this level with probable minimal flattening of the ventral cord, but no visible cord signal changes. Foramina remain patent. Otherwise, no other significant disc pathology seen within the thoracic spine. No other visible spinal stenosis. Foramina remain largely patent. IMPRESSION: 1. Technically limited exam due to extensive motion artifact and patient's inability to tolerate the full length of the study. 2. No definite acute abnormality within the thoracic spine. No appreciable cord signal abnormality on this limited exam. 3. Small disc protrusions at T2-3 and T7-8 with resultant mild spinal stenosis at the T7-8 level. No other significant stenosis within the thoracic spine. Electronically Signed   By: Jeannine Boga M.D.   On: 04/13/2021 00:23   MR LUMBAR SPINE WO CONTRAST  Result Date: 04/13/2021 CLINICAL DATA:  Initial evaluation for acute myelopathy. EXAM: MRI LUMBAR SPINE WITHOUT CONTRAST TECHNIQUE: Multiplanar, multisequence MR imaging of the lumbar spine was performed. No intravenous contrast was administered. COMPARISON:  None available. FINDINGS: Segmentation: Examination technically limited by habitus and motion artifact. Standard segmentation. Lowest well-formed disc space labeled the L5-S1 level. Alignment: Exaggeration of the normal lumbar lordosis. Trace anterolisthesis of L4 on L5, likely chronic and facet mediated. Underlying trace scoliosis. Vertebrae: Vertebral body height maintained without acute or chronic fracture. Bone marrow signal intensity diffusely decreased on T1 weighted sequence, nonspecific, but most commonly related to anemia, smoking or obesity. No worrisome osseous lesions. No significant abnormal marrow edema. Conus medullaris and cauda equina: Conus extends to approximately the L1 level.  Conus medullaris and nerve roots of the cauda equina are grossly normal. Paraspinal and other soft tissues: Mild diffuse edema present within the lower posterior paraspinous musculature at L4 through S1. Findings suspected to be related to adjacent facet arthritis, although muscular injury/strain could also have this appearance (series 11, images 6, 12). Paraspinous soft tissues demonstrate no other visible acute abnormality. Partially visualized urinary bladder is moderately distended. Visualized visceral structures otherwise grossly unremarkable. Disc levels: L1-2:  Unremarkable. L2-3: Mild annular disc bulge. No significant spinal stenosis. Foramina remain patent. L3-4: Mild annular disc bulge. Probable mild facet hypertrophy. No significant spinal stenosis. Foramina remain grossly patent. L4-5: Mild disc bulge with disc desiccation. Moderate right worse than left facet hypertrophy. Resultant mild-to-moderate canal with bilateral lateral recess stenosis. Mild left with moderate right L4 foraminal narrowing. L5-S1: Disc desiccation with diffuse disc bulge. Superimposed left subarticular disc protrusion contacts and mildly displaces the descending left S1 nerve root (series 12, image 35). Mild to moderate facet hypertrophy. Epidural lipomatosis. No significant spinal stenosis. Foramina remain patent. IMPRESSION: 1. Technically limited exam due to habitus and motion artifact. 2. No acute abnormality within the lumbar spine. Grossly normal appearance of the conus medullaris and cauda equina. 3. Disc bulge with facet hypertrophy at L4-5 with resultant mild to moderate canal and bilateral lateral recess stenosis, with moderate right L4 foraminal narrowing. 4. Small left subarticular disc protrusion at L5-S1, contacting and mildly displacing the descending left S1 nerve root. 5. Mild diffuse edema within the lower posterior paraspinous musculature at L4 through S1, suspected to  be related to adjacent facet arthritis,  although muscular injury/strain could also have this appearance. Electronically Signed   By: Jeannine Boga M.D.   On: 04/13/2021 00:30        Scheduled Meds:  allopurinol  100 mg Oral Daily   amLODipine  5 mg Oral Daily   enoxaparin (LOVENOX) injection  80 mg Subcutaneous Q24H   fluconazole  100 mg Oral Daily   insulin aspart  0-20 Units Subcutaneous TID WC   mometasone-formoterol  2 puff Inhalation BID   Continuous Infusions:  sodium chloride 100 mL/hr (04/13/21 0251)     LOS: 0 days     Cordelia Poche, MD [facility] 04/14/2021, 1:10 PM  If 7PM-7AM, please contact night-coverage [email or web address]

## 2021-04-15 DIAGNOSIS — E871 Hypo-osmolality and hyponatremia: Secondary | ICD-10-CM | POA: Diagnosis not present

## 2021-04-15 DIAGNOSIS — R29898 Other symptoms and signs involving the musculoskeletal system: Secondary | ICD-10-CM | POA: Diagnosis not present

## 2021-04-15 DIAGNOSIS — R8290 Unspecified abnormal findings in urine: Secondary | ICD-10-CM | POA: Diagnosis not present

## 2021-04-15 DIAGNOSIS — R9089 Other abnormal findings on diagnostic imaging of central nervous system: Secondary | ICD-10-CM | POA: Diagnosis not present

## 2021-04-15 LAB — GLUCOSE, CAPILLARY
Glucose-Capillary: 150 mg/dL — ABNORMAL HIGH (ref 70–99)
Glucose-Capillary: 116 mg/dL — ABNORMAL HIGH (ref 70–99)
Glucose-Capillary: 147 mg/dL — ABNORMAL HIGH (ref 70–99)
Glucose-Capillary: 176 mg/dL — ABNORMAL HIGH (ref 70–99)

## 2021-04-15 NOTE — NC FL2 (Signed)
[place] LEVEL OF CARE SCREENING TOOL     IDENTIFICATION  Patient Name: Makayla [name] Birthdate: 04/28/1965 Sex: female Admission Date (Current Location): 04/12/2021  [place] and [place] Number:  Kathleen Argue [id number] [place] and Address:  [facility],  [address], [place]      Provider Number: [id number]  Attending Physician Name and Address:  Mariel Aloe, MD  Relative Name and Phone Number:  Miles,Joyce Sister Jasper [phone]    Severin,Louise Mother [phone]    Miles,Michael Relative [phone]  [phone]    Current Level of Care: Hospital Recommended Level of Care: [place] Prior Approval Number:    Date Approved/Denied:   PASRR Number: [id number]  Discharge Plan: SNF    Current Diagnoses: Patient Active Problem List   Diagnosis Date Noted   Erythema intertrigo 04/14/2021   Hypothyroidism 04/14/2021   Abnormal urinalysis 04/13/2021   Hyponatremia 04/13/2021   Bilateral leg weakness 04/13/2021   Abnormal brain MRI 04/13/2021   Cellulitis 03/22/2021   Cellulitis of abdominal wall 03/21/2021   Type 2 diabetes mellitus with hypoglycemia without coma ([place]) 03/21/2021   Stage 3b chronic kidney disease (CKD) ([place]) 03/21/2021   Elevated CK 03/21/2021   Ovarian mass, left 03/21/2021   Renal failure, acute on chronic ([place]) 12/10/2012   Hypokalemia 12/08/2012   ARF (acute renal failure) ([place]) 12/08/2012   Dehydration 12/08/2012   Obesity, morbid ([place]) 12/08/2012   Tobacco abuse 12/08/2012   SNORING 06/16/2009   Diabetes mellitus without complication ([place]) 66/44/0347   Hyperlipidemia associated with type 2 diabetes mellitus ([place]) 05/09/2009   Hypertension associated with diabetes ([place]) 05/09/2009    Orientation RESPIRATION BLADDER Height & Weight     Self, Time, Situation, Place  Normal Continent Weight: (!) 350 lb 1.5 oz (158.8 kg) Height:  5\' 4"  (162.6  cm)  BEHAVIORAL SYMPTOMS/MOOD NEUROLOGICAL BOWEL NUTRITION STATUS      Continent Diet (Renal Carb Modified)  AMBULATORY STATUS COMMUNICATION OF NEEDS Skin   Limited Assist Verbally Skin abrasions                       Personal Care Assistance Level of Assistance  Bathing, Feeding, Dressing Bathing Assistance: Limited assistance Feeding assistance: Independent Dressing Assistance: Limited assistance     Functional Limitations Info  Sight, Hearing, Speech Sight Info: Adequate Hearing Info: Adequate Speech Info: Adequate    SPECIAL CARE FACTORS FREQUENCY  PT (By licensed PT), OT (By licensed OT)     PT Frequency: Minimum 5x a week OT Frequency: Minimum 5x a week            Contractures Contractures Info: Not present    Additional Factors Info  Code Status, Allergies, Insulin Sliding Scale Code Status Info: DNR Allergies Info: Penicillins   Ibuprofen   Peach Flavor   Strawberry Extract   Insulin Sliding Scale Info: insulin aspart (novoLOG) injection 0-20 Units 3x a day with meals       Current Medications (04/15/2021):  This is the current hospital active medication list Current Facility-Administered Medications  Medication Dose Route Frequency Provider Last Rate Last Admin   acetaminophen (TYLENOL) tablet 650 mg  650 mg Oral Q6H PRN Mariel Aloe, MD   650 mg at 04/15/21 1149   Or   acetaminophen (TYLENOL) suppository 650 mg  650 mg Rectal Q6H PRN Mariel Aloe, MD       allopurinol (ZYLOPRIM) tablet 100 mg  100 mg  Oral Daily Mariel Aloe, MD   100 mg at 04/15/21 1102   amLODipine (NORVASC) tablet 5 mg  5 mg Oral Daily Mariel Aloe, MD   5 mg at 04/15/21 1102   enoxaparin (LOVENOX) injection 80 mg  80 mg Subcutaneous Q24H Mariel Aloe, MD   80 mg at 04/15/21 1102   fluconazole (DIFLUCAN) tablet 100 mg  100 mg Oral Daily Mariel Aloe, MD   100 mg at 04/15/21 1103   hydrALAZINE (APRESOLINE) tablet 25 mg  25 mg Oral Q6H PRN Mariel Aloe, MD        insulin aspart (novoLOG) injection 0-20 Units  0-20 Units Subcutaneous TID WC Mariel Aloe, MD   3 Units at 04/15/21 1101   levothyroxine (SYNTHROID) tablet 50 mcg  50 mcg Oral Q0600 Mariel Aloe, MD   50 mcg at 04/15/21 0455   mometasone-formoterol (DULERA) 200-5 MCG/ACT inhaler 2 puff  2 puff Inhalation BID Mariel Aloe, MD   2 puff at 04/15/21 0914   ondansetron (ZOFRAN) tablet 4 mg  4 mg Oral Q6H PRN Mariel Aloe, MD       Or   ondansetron [facility]) injection 4 mg  4 mg Intravenous Q6H PRN Mariel Aloe, MD         Discharge Medications: Please see discharge summary for a list of discharge medications.  Relevant Imaging Results:  Relevant Lab Results:   Additional Information SSN [id number]  Ross Ludwig, LCSW

## 2021-04-15 NOTE — TOC Progression Note (Signed)
Transition of Care [facility]) - Progression Note    Patient Details  Name: Makayla [name] MRN: [id number] Date of Birth: [birth year]  [facility] CM/SW Contact  Ross Ludwig, [place] Phone Number: 04/15/2021, 6:36 PM  Clinical Narrative:    Patient has been faxed out, waiting for bed offers.  Patient will need short term rehab and also insurance authorization.  CSW to continue to follow patient's progress throughout discharge planning.        Expected Discharge Plan and Services                                                 Social Determinants of Health (SDOH) Interventions    Readmission Risk Interventions No flowsheet data found.

## 2021-04-15 NOTE — Progress Notes (Signed)
PROGRESS NOTE    Makayla [name]  WUX:[id number] DOB: 1965-08-12 DOA: 04/12/2021 PCP: Vonna Drafts, FNP   Brief Narrative: Makayla [name] is a 56 y.o. female with medical history significant of recent cellulitis, CKD stage IIIb, hypertension, neuropathy, diabetes mellitus, obesity. Patient presented at the suggestion of her outpatient practitioner secondary to ongoing weakness and inability to be cared for at home. She was found to have mild hyponatremia which has been improved with IV fluids. PT/OT pending for disposition planning.   Assessment & Plan:   * Hyponatremia Concern for hypovolemic hyponatremia on admission. Patient recently admitted and found to have hyponatremia at that time with associated AKI. Sodium of 125 on admission which has improved to 130 with IV fluids.  Bilateral leg weakness Chronic issue for several decades. MRI with multilevel degenerative disease. No cord compression noted. Patient will need outpatient neurosurgery follow-up. -PT/OT recommendations  Diabetes mellitus without complication ([place]) Hemoglobin A1C of 6% from 03/2021 -SSI while inpatient  Abnormal brain MRI Atrophy greater than expected for age. Recommend outpatient neurology follow-up.  Abnormal urinalysis- (present on admission) No associated urinary symptoms. Ceftriaxone given in the ED. Urine culture with multiple species.  Ovarian mass, left- (present on admission) Noted on recent admission with recommendations to follow-up with gynecology as an outpatient.  Stage 3b chronic kidney disease (CKD) ([place])- (present on admission) Appears to be at baseline. Recent AKI from previous admission appears to be resolved.  Obesity, morbid ([place])- (present on admission) Body mass index is 68.66 kg/m.  Hypertension associated with diabetes ([place])- (present on admission) -Continue home amlodipine  Hypothyroidism -Continue Synthroid 50 mcg daily  Erythema intertrigo Patient evaluated by wound  care. Llarge panus with deep skin folds. Moisture associated. -Keep area dry -Fluconazole PO x 7 days    DVT prophylaxis: Lovenox Code Status:   Code Status: DNR Family Communication: Niece at bedside Disposition Plan: Discharge to SNF bed availability. Medically stable for discharge   Consultants:  None  Procedures:  None  Antimicrobials: Ceftriaxone IV x1    Subjective: Feeling better this afternoon.  Objective: Vitals:   04/14/21 1616 04/14/21 2208 04/15/21 0501 04/15/21 0915  BP: 125/69 116/68 138/70   Pulse: (!) 102 95 91   Resp: 20 20 20    Temp: 99.1 F (37.3 C) 99 F (37.2 C) 98.4 F (36.9 C)   TempSrc: Oral Oral Oral   SpO2: 93% 96% 90% 90%  Weight:      Height:        Intake/Output Summary (Last 24 hours) at 04/15/2021 1631 Last data filed at 04/15/2021 0502 Gross per 24 hour  Intake --  Output 1500 ml  Net -1500 ml    Filed Weights   04/12/21 1440 04/13/21 2205  Weight: (!) 181.4 kg (!) 158.8 kg    Examination:  General exam: Appears calm and comfortable Respiratory system: Clear to auscultation. Respiratory effort normal. Cardiovascular system: S1 & S2 heard, RRR. No murmurs, rubs, gallops or clicks. Gastrointestinal system: Abdomen is nondistended, soft and nontender. Normal bowel sounds heard. Central nervous system: Alert and oriented. No focal neurological deficits. Musculoskeletal: No calf tenderness Skin: No cyanosis. No rashes Psychiatry: Judgement and insight appear normal. Mood & affect appropriate.     Data Reviewed: I have personally reviewed following labs and imaging studies  CBC Lab Results  Component Value Date   WBC 8.9 04/14/2021   RBC 4.33 04/14/2021   HGB 11.8 (L) 04/14/2021   HCT 37.2 04/14/2021   MCV 85.9 04/14/2021  MCH 27.3 04/14/2021   PLT 231 04/14/2021   MCHC 31.7 04/14/2021   RDW 14.3 04/14/2021   LYMPHSABS 1.6 04/12/2021   MONOABS 1.5 (H) 04/12/2021   EOSABS 0.0 04/12/2021   BASOSABS 0.0 04/12/2021      Last metabolic panel Lab Results  Component Value Date   NA 130 (L) 04/14/2021   K 4.1 04/14/2021   CL 99 04/14/2021   CO2 21 (L) 04/14/2021   BUN 12 04/14/2021   CREATININE 1.05 (H) 04/14/2021   GLUCOSE 155 (H) 04/14/2021   GFRNONAA >60 04/14/2021   GFRAA 27 (L) 12/24/2019   CALCIUM 8.5 (L) 04/14/2021   PHOS 4.1 12/28/2008   PROT 8.1 04/12/2021   ALBUMIN 3.1 (L) 04/12/2021   BILITOT 0.6 04/12/2021   ALKPHOS 72 04/12/2021   AST 22 04/12/2021   ALT 10 04/12/2021   ANIONGAP 10 04/14/2021    CBG (last 3)  Recent Labs    04/14/21 2202 04/15/21 0734 04/15/21 1200  GLUCAP 148* 150* 116*      GFR: Estimated Creatinine Clearance: 94.2 mL/min (A) (by C-G formula based on SCr of 1.05 mg/dL (H)).  Coagulation Profile: Recent Labs  Lab 04/12/21 1706  INR 1.2     Recent Results (from the past 240 hour(s))  Resp Panel by RT-PCR (Flu A&B, Covid)     Status: None   Collection Time: 04/12/21  5:06 PM   Specimen: Nasopharyngeal(NP) swabs in vial transport medium  Result Value Ref Range Status   SARS Coronavirus 2 by RT PCR NEGATIVE NEGATIVE Final    Comment: (NOTE) SARS-CoV-2 target nucleic acids are NOT DETECTED.  The SARS-CoV-2 RNA is generally detectable in upper respiratory specimens during the acute phase of infection. The lowest concentration of SARS-CoV-2 viral copies this assay can detect is 138 copies/mL. A negative result does not preclude SARS-Cov-2 infection and should not be used as the sole basis for treatment or other patient management decisions. A negative result may occur with  improper specimen collection/handling, submission of specimen other than nasopharyngeal swab, presence of viral mutation(s) within the areas targeted by this assay, and inadequate number of viral copies(<138 copies/mL). A negative result must be combined with clinical observations, patient history, and epidemiological information. The expected result is Negative.  Fact  Sheet for Patients:  EntrepreneurPulse.com.au  Fact Sheet for Healthcare Providers:  IncredibleEmployment.be  This test is no t yet approved or cleared by the [place] FDA and  has been authorized for detection and/or diagnosis of SARS-CoV-2 by FDA under an Emergency Use Authorization (EUA). This EUA will remain  in effect (meaning this test can be used) for the duration of the COVID-19 declaration under Section 564(b)(1) of the Act, 21 U.S.C.section 360bbb-3(b)(1), unless the authorization is terminated  or revoked sooner.       Influenza A by PCR NEGATIVE NEGATIVE Final   Influenza B by PCR NEGATIVE NEGATIVE Final    Comment: (NOTE) The Xpert Xpress SARS-CoV-2/FLU/RSV plus assay is intended as an aid in the diagnosis of influenza from Nasopharyngeal swab specimens and should not be used as a sole basis for treatment. Nasal washings and aspirates are unacceptable for Xpert Xpress SARS-CoV-2/FLU/RSV testing.  Fact Sheet for Patients: EntrepreneurPulse.com.au  Fact Sheet for Healthcare Providers: IncredibleEmployment.be  This test is not yet approved or cleared by the [place] FDA and has been authorized for detection and/or diagnosis of SARS-CoV-2 by FDA under an Emergency Use Authorization (EUA). This EUA will remain in effect (meaning this test can be  used) for the duration of the COVID-19 declaration under Section 564(b)(1) of the Act, 21 U.S.C. section 360bbb-3(b)(1), unless the authorization is terminated or revoked.  Performed at [facility], [place] [address] [place], [place] [address]   Blood culture (routine x 2)     Status: None (Preliminary result)   Collection Time: 04/12/21  5:06 PM   Specimen: Right Antecubital; Blood  Result Value Ref Range Status   Specimen Description   Final    RIGHT ANTECUBITAL BLOOD Performed at [place]  [address] [place], [place] [address]    Special Requests   Final    Blood Culture adequate volume BOTTLES DRAWN AEROBIC AND ANAEROBIC Performed at [place] [address] [place], [place] [address]    Culture   Final    NO GROWTH 3 DAYS Performed at [facility], [place] [address] [place], [place] [address]    Report Status PENDING  Incomplete  Blood culture (routine x 2)     Status: None (Preliminary result)   Collection Time: 04/12/21  5:06 PM   Specimen: BLOOD RIGHT HAND  Result Value Ref Range Status   Specimen Description   Final    BLOOD RIGHT HAND BLOOD Performed at [place] [address] [place], [place] [address]    Special Requests   Final    Blood Culture adequate volume BOTTLES DRAWN AEROBIC AND ANAEROBIC Performed at [place] [address] [place], [place] [address]    Culture   Final    NO GROWTH 3 DAYS Performed at [facility], [place] [address] [place], [place] [address]    Report Status PENDING  Incomplete  Urine Culture     Status: Abnormal   Collection Time: 04/13/21 12:00 AM   Specimen: Urine, Clean Catch  Result Value Ref Range Status   Specimen Description   Final    URINE, CLEAN CATCH Performed at [facility], [place] [address] [place], [place] [address]    Special Requests   Final    NONE Performed at [facility], [place] [address], [place], [place] [address]    Culture MULTIPLE SPECIES PRESENT, SUGGEST RECOLLECTION (A)  Final   Report Status 04/14/2021 FINAL  Final         Radiology Studies: No results found.      Scheduled Meds:  allopurinol  100 mg Oral Daily   amLODipine  5 mg Oral Daily   enoxaparin (LOVENOX) injection  80 mg Subcutaneous Q24H   fluconazole  100 mg Oral Daily   insulin aspart  0-20 Units Subcutaneous TID WC   levothyroxine  50 mcg Oral Q0600   mometasone-formoterol  2 puff Inhalation BID    Continuous Infusions:     LOS: 0 days     Cordelia Poche, MD [facility] 04/15/2021, 4:31 PM  If 7PM-7AM, please contact night-coverage [email or web address]

## 2021-04-16 DIAGNOSIS — R9089 Other abnormal findings on diagnostic imaging of central nervous system: Secondary | ICD-10-CM | POA: Diagnosis not present

## 2021-04-16 DIAGNOSIS — E871 Hypo-osmolality and hyponatremia: Secondary | ICD-10-CM | POA: Diagnosis not present

## 2021-04-16 DIAGNOSIS — R29898 Other symptoms and signs involving the musculoskeletal system: Secondary | ICD-10-CM | POA: Diagnosis not present

## 2021-04-16 DIAGNOSIS — R8290 Unspecified abnormal findings in urine: Secondary | ICD-10-CM | POA: Diagnosis not present

## 2021-04-16 LAB — GLUCOSE, CAPILLARY
Glucose-Capillary: 174 mg/dL — ABNORMAL HIGH (ref 70–99)
Glucose-Capillary: 162 mg/dL — ABNORMAL HIGH (ref 70–99)
Glucose-Capillary: 217 mg/dL — ABNORMAL HIGH (ref 70–99)
Glucose-Capillary: 157 mg/dL — ABNORMAL HIGH (ref 70–99)

## 2021-04-16 MED ORDER — ADULT MULTIVITAMIN W/MINERALS CH
1.0000 | ORAL_TABLET | Freq: Every day | ORAL | Status: DC
  Administered 2021-04-16 – 2021-04-19 (×4): 1 via ORAL
  Filled 2021-04-16 (×4): qty 1

## 2021-04-16 NOTE — Progress Notes (Signed)
PROGRESS NOTE    Makayla [name]  DPO:[id number] DOB: [birth year] DOA: 04/12/2021 PCP: Vonna Drafts, FNP   Brief Narrative: Makayla [name] is a 56 y.o. female with medical history significant of recent cellulitis, CKD stage IIIb, hypertension, neuropathy, diabetes mellitus, obesity. Patient presented at the suggestion of her outpatient practitioner secondary to ongoing weakness and inability to be cared for at home. She was found to have mild hyponatremia which has been improved with IV fluids. Plan for discharge to SNF.   Assessment & Plan:   * Hyponatremia Concern for hypovolemic hyponatremia on admission. Patient recently admitted and found to have hyponatremia at that time with associated AKI. Sodium of 125 on admission which has improved to 130 with IV fluids.  Bilateral leg weakness Chronic issue for several decades. MRI with multilevel degenerative disease. No cord compression noted. Some left sided weakness and numbness associated. -Consult Neurosurgery for recommendations -PT/OT recommendations: SNF  Diabetes mellitus without complication ([place]) Hemoglobin A1C of 6% from 03/2021 -SSI while inpatient  Abnormal brain MRI Atrophy greater than expected for age. Recommend outpatient neurology follow-up.  Abnormal urinalysis- (present on admission) No associated urinary symptoms. Ceftriaxone given in the ED. Urine culture with multiple species.  Ovarian mass, left- (present on admission) Noted on recent admission with recommendations to follow-up with gynecology as an outpatient.  Stage 3b chronic kidney disease (CKD) ([place])- (present on admission) Appears to be at baseline. Recent AKI from previous admission appears to be resolved.  Obesity, morbid ([place])- (present on admission) Body mass index is 68.66 kg/m.  Hypertension associated with diabetes ([employer])- (present on admission) -Continue home amlodipine  Hypothyroidism -Continue Synthroid 50 mcg daily  Erythema  intertrigo Patient evaluated by wound care. Llarge panus with deep skin folds. Moisture associated. -Keep area dry -Fluconazole PO x 7 days    DVT prophylaxis: Lovenox Code Status:   Code Status: DNR Family Communication: None at bedside Disposition Plan: Discharge to SNF bed availability. Medically stable for discharge   Consultants:  None  Procedures:  None  Antimicrobials: Ceftriaxone IV x1    Subjective: Got up yesterday but had significant left leg weakness  Objective: Vitals:   04/15/21 1928 04/15/21 2108 04/16/21 0547 04/16/21 0824  BP:  122/71 (!) 155/78   Pulse:  91 93   Resp:  16 16   Temp:  98.2 F (36.8 C) 98.3 F (36.8 C)   TempSrc:  Oral Oral   SpO2: 95% 97% 99% 99%  Weight:      Height:        Intake/Output Summary (Last 24 hours) at 04/16/2021 1441 Last data filed at 04/16/2021 1200 Gross per 24 hour  Intake 600 ml  Output 2750 ml  Net -2150 ml    Filed Weights   04/12/21 1440 04/13/21 2205  Weight: (!) 181.4 kg (!) 158.8 kg    Examination:  General exam: Appears calm and comfortable Respiratory system: Clear to auscultation. Respiratory effort normal. Cardiovascular system: S1 & S2 heard, RRR. Gastrointestinal system: Abdomen is nondistended, soft and nontender. No organomegaly or masses felt. Normal bowel sounds heard. Central nervous system: Alert and oriented. Left leg strength is 3/5. Mild clonus of right foot. Musculoskeletal: No calf tenderness Skin: No cyanosis. No rashes Psychiatry: Judgement and insight appear normal. Mood & affect appropriate.     Data Reviewed: I have personally reviewed following labs and imaging studies  CBC Lab Results  Component Value Date   WBC 8.9 04/14/2021   RBC 4.33 04/14/2021   HGB  11.8 (L) 04/14/2021   HCT 37.2 04/14/2021   MCV 85.9 04/14/2021   MCH 27.3 04/14/2021   PLT 231 04/14/2021   MCHC 31.7 04/14/2021   RDW 14.3 04/14/2021   LYMPHSABS 1.6 04/12/2021   MONOABS 1.5 (H) 04/12/2021    EOSABS 0.0 04/12/2021   BASOSABS 0.0 01/74/9449     Last metabolic panel Lab Results  Component Value Date   NA 130 (L) 04/14/2021   K 4.1 04/14/2021   CL 99 04/14/2021   CO2 21 (L) 04/14/2021   BUN 12 04/14/2021   CREATININE 1.05 (H) 04/14/2021   GLUCOSE 155 (H) 04/14/2021   GFRNONAA >60 04/14/2021   GFRAA 27 (L) 12/24/2019   CALCIUM 8.5 (L) 04/14/2021   PHOS 4.1 12/28/2008   PROT 8.1 04/12/2021   ALBUMIN 3.1 (L) 04/12/2021   BILITOT 0.6 04/12/2021   ALKPHOS 72 04/12/2021   AST 22 04/12/2021   ALT 10 04/12/2021   ANIONGAP 10 04/14/2021    CBG (last 3)  Recent Labs    04/15/21 2104 04/16/21 0750 04/16/21 1150  GLUCAP 176* 174* 162*      GFR: Estimated Creatinine Clearance: 94.2 mL/min (A) (by C-G formula based on SCr of 1.05 mg/dL (H)).  Coagulation Profile: Recent Labs  Lab 04/12/21 1706  INR 1.2     Recent Results (from the past 240 hour(s))  Resp Panel by RT-PCR (Flu A&B, Covid)     Status: None   Collection Time: 04/12/21  5:06 PM   Specimen: Nasopharyngeal(NP) swabs in vial transport medium  Result Value Ref Range Status   SARS Coronavirus 2 by RT PCR NEGATIVE NEGATIVE Final    Comment: (NOTE) SARS-CoV-2 target nucleic acids are NOT DETECTED.  The SARS-CoV-2 RNA is generally detectable in upper respiratory specimens during the acute phase of infection. The lowest concentration of SARS-CoV-2 viral copies this assay can detect is 138 copies/mL. A negative result does not preclude SARS-Cov-2 infection and should not be used as the sole basis for treatment or other patient management decisions. A negative result may occur with  improper specimen collection/handling, submission of specimen other than nasopharyngeal swab, presence of viral mutation(s) within the areas targeted by this assay, and inadequate number of viral copies(<138 copies/mL). A negative result must be combined with clinical observations, patient history, and  epidemiological information. The expected result is Negative.  Fact Sheet for Patients:  EntrepreneurPulse.com.au  Fact Sheet for Healthcare Providers:  IncredibleEmployment.be  This test is no t yet approved or cleared by the [place] FDA and  has been authorized for detection and/or diagnosis of SARS-CoV-2 by FDA under an Emergency Use Authorization (EUA). This EUA will remain  in effect (meaning this test can be used) for the duration of the COVID-19 declaration under Section 564(b)(1) of the Act, 21 U.S.C.section 360bbb-3(b)(1), unless the authorization is terminated  or revoked sooner.       Influenza A by PCR NEGATIVE NEGATIVE Final   Influenza B by PCR NEGATIVE NEGATIVE Final    Comment: (NOTE) The Xpert Xpress SARS-CoV-2/FLU/RSV plus assay is intended as an aid in the diagnosis of influenza from Nasopharyngeal swab specimens and should not be used as a sole basis for treatment. Nasal washings and aspirates are unacceptable for Xpert Xpress SARS-CoV-2/FLU/RSV testing.  Fact Sheet for Patients: EntrepreneurPulse.com.au  Fact Sheet for Healthcare Providers: IncredibleEmployment.be  This test is not yet approved or cleared by the [place] FDA and has been authorized for detection and/or diagnosis of SARS-CoV-2 by FDA under an  Emergency Use Authorization (EUA). This EUA will remain in effect (meaning this test can be used) for the duration of the COVID-19 declaration under Section 564(b)(1) of the Act, 21 U.S.C. section 360bbb-3(b)(1), unless the authorization is terminated or revoked.  Performed at [facility], [place] [address], [place], [place] [address]   Blood culture (routine x 2)     Status: None (Preliminary result)   Collection Time: 04/12/21  5:06 PM   Specimen: Right Antecubital; Blood  Result Value Ref Range Status   Specimen Description   Final    RIGHT  ANTECUBITAL BLOOD Performed at [place] [address] [place], [place] [address]    Special Requests   Final    Blood Culture adequate volume BOTTLES DRAWN AEROBIC AND ANAEROBIC Performed at [place] [address] [place], [place] [address]    Culture   Final    NO GROWTH 4 DAYS Performed at [facility], [place] [address] [place], [place] [address]    Report Status PENDING  Incomplete  Blood culture (routine x 2)     Status: None (Preliminary result)   Collection Time: 04/12/21  5:06 PM   Specimen: BLOOD RIGHT HAND  Result Value Ref Range Status   Specimen Description   Final    BLOOD RIGHT HAND BLOOD Performed at [place] [address] [place], [place] [address]    Special Requests   Final    Blood Culture adequate volume BOTTLES DRAWN AEROBIC AND ANAEROBIC Performed at [place] [address], [place], [place] [address]    Culture   Final    NO GROWTH 4 DAYS Performed at [facility], [place] [address], [place], [place] [address]    Report Status PENDING  Incomplete  Urine Culture     Status: Abnormal   Collection Time: 04/13/21 12:00 AM   Specimen: Urine, Clean Catch  Result Value Ref Range Status   Specimen Description   Final    URINE, CLEAN CATCH Performed at [facility], [place] [address], [place], [place] [address]    Special Requests   Final    NONE Performed at [facility], [place] [address], [place],  [address]    Culture MULTIPLE SPECIES PRESENT, SUGGEST RECOLLECTION (A)  Final   Report Status 04/14/2021 FINAL  Final         Radiology Studies: No results found.      Scheduled Meds:  allopurinol  100 mg Oral Daily   amLODipine  5 mg Oral Daily   enoxaparin (LOVENOX) injection  80 mg Subcutaneous Q24H   fluconazole  100 mg Oral Daily   insulin aspart  0-20 Units Subcutaneous TID WC   levothyroxine   50 mcg Oral Q0600   mometasone-formoterol  2 puff Inhalation BID   multivitamin with minerals  1 tablet Oral Daily   Continuous Infusions:     LOS: 0 days     Cordelia Poche, MD [facility] 04/16/2021, 2:41 PM  If 7PM-7AM, please contact night-coverage [email or web address]

## 2021-04-16 NOTE — Progress Notes (Signed)
Initial Nutrition Assessment  DOCUMENTATION CODES:   Morbid obesity  INTERVENTION:   Multivitamin w/ minerals daily  NUTRITION DIAGNOSIS:   Increased nutrient needs related to acute illness as evidenced by estimated needs.  GOAL:   Patient will meet greater than or equal to 90% of their needs  MONITOR:   PO intake, Labs, Weight trends, Skin  REASON FOR ASSESSMENT:   Consult Assessment of nutrition requirement/status  ASSESSMENT:   56 y.o. female presented to the ED after recommendation from outpatient physician to present for SNF placement. Pt recently admited with abdominal cellulitis, a fall, and generalized weakness; after d/c family with increased difficulty caring for pt. PMH includes T2DM, HTN, and CKD IIIb. Pt admitted with hyponatremia.   RD working remotely unable to reach pt via phone.   Pt on carb modified diet, receiving house trays at this time. Per EMR, pt consumed 100% of her dinner on 1/6.   Suspect weight on 12/21 was stated; based on that, pt has gained weight since previous admission.   Pt evaluated by wound care for erythema intertrigo and placed on an antifungal.   Pt currently medically stable for d/c; pending SNF bed placement.  Medications reviewed and include: Fluconazole, SSI 0-20 units TID Labs reviewed:  - Sodium 130  - Hgb A1c 6% (03/22/21) - 24 he CBG: 116-174  NUTRITION - FOCUSED PHYSICAL EXAM:  Deferred to follow-up.   Diet Order:   Diet Order             Diet Carb Modified Fluid consistency: Thin; Room service appropriate? Yes  Diet effective now                   EDUCATION NEEDS:   No education needs have been identified at this time  Skin:  Skin Assessment: Reviewed RN Assessment  Last BM:  01/06  Height:   Ht Readings from Last 1 Encounters:  04/13/21 5\' 4"  (1.626 m)    Weight:   Wt Readings from Last 1 Encounters:  04/13/21 (!) 158.8 kg    Ideal Body Weight:  54.6 kg  BMI:  Body mass index is  60.09 kg/m.  Estimated Nutritional Needs:   Kcal:  2000-2200  Protein:  100-115 grams  Fluid:  >/= 2 L    Collyns Mcquigg Louie Casa, RD, LDN Clinical Dietitian See [facility] for contact information.

## 2021-04-17 DIAGNOSIS — R29898 Other symptoms and signs involving the musculoskeletal system: Secondary | ICD-10-CM | POA: Diagnosis not present

## 2021-04-17 DIAGNOSIS — R9089 Other abnormal findings on diagnostic imaging of central nervous system: Secondary | ICD-10-CM | POA: Diagnosis not present

## 2021-04-17 DIAGNOSIS — R8290 Unspecified abnormal findings in urine: Secondary | ICD-10-CM | POA: Diagnosis not present

## 2021-04-17 DIAGNOSIS — E871 Hypo-osmolality and hyponatremia: Secondary | ICD-10-CM | POA: Diagnosis not present

## 2021-04-17 LAB — CULTURE, BLOOD (ROUTINE X 2)
Culture: NO GROWTH
Culture: NO GROWTH
Special Requests: ADEQUATE
Special Requests: ADEQUATE

## 2021-04-17 LAB — GLUCOSE, CAPILLARY
Glucose-Capillary: 151 mg/dL — ABNORMAL HIGH (ref 70–99)
Glucose-Capillary: 154 mg/dL — ABNORMAL HIGH (ref 70–99)
Glucose-Capillary: 166 mg/dL — ABNORMAL HIGH (ref 70–99)
Glucose-Capillary: 162 mg/dL — ABNORMAL HIGH (ref 70–99)

## 2021-04-17 NOTE — TOC Progression Note (Signed)
Transition of Care [facility]) - Progression Note    Patient Details  Name: Makayla [name] MRN: [id number] Date of Birth: 07-07-1965  Transition of Care [facility]) CM/SW Contact  Purcell Mouton, RN Phone Number: 04/17/2021, 3:27 PM  Clinical Narrative:    Spoke with pt's sister Makayla [name] who states that pt will be too far away in [place], [place]. Makayla [name] states that family can not take care of pt related to pt not walking and Incontinof bowel and bladder.  [facility] can not take pt's at present time.         Expected Discharge Plan and Services                                                 Social Determinants of Health (SDOH) Interventions    Readmission Risk Interventions No flowsheet data found.

## 2021-04-17 NOTE — TOC Progression Note (Addendum)
Transition of Care [facility]) - Progression Note    Patient Details  Name: Makayla [name] MRN: [id number] Date of Birth: [birth year]  Transition of Care [facility]) CM/SW Contact  Purcell Mouton, RN Phone Number: 04/17/2021, 10:54 AM  Clinical Narrative:    [facility] offers given to pt and sister [name]. Facility will need to start authorization. [facility] was selected.         Expected Discharge Plan and Services                                                 Social Determinants of Health (SDOH) Interventions    Readmission Risk Interventions No flowsheet data found.

## 2021-04-17 NOTE — Progress Notes (Signed)
PROGRESS NOTE    Makayla [name]  KCL:[id number] DOB: 07-09-1965 DOA: 04/12/2021 PCP: Vonna Drafts, FNP   Brief Narrative: Makayla [name] is a 56 y.o. female with medical history significant of recent cellulitis, CKD stage IIIb, hypertension, neuropathy, diabetes mellitus, obesity. Patient presented at the suggestion of her outpatient practitioner secondary to ongoing weakness and inability to be cared for at home. She was found to have mild hyponatremia which has been improved with IV fluids. Plan for discharge to SNF.   Assessment & Plan:   * Hyponatremia Concern for hypovolemic hyponatremia on admission. Patient recently admitted and found to have hyponatremia at that time with associated AKI. Sodium of 125 on admission which has improved to 130 with IV fluids.  Bilateral leg weakness Chronic issue for several decades. MRI with multilevel degenerative disease. No cord compression noted. Some left sided weakness and numbness associated. -Consult Neurosurgery for recommendations -PT/OT recommendations: SNF  Diabetes mellitus without complication ([place]) Hemoglobin A1C of 6% from 03/2021 -SSI while inpatient  Abnormal brain MRI Atrophy greater than expected for age. Recommend outpatient neurology follow-up.  Abnormal urinalysis- (present on admission) No associated urinary symptoms. Ceftriaxone given in the ED. Urine culture with multiple species.  Ovarian mass, left- (present on admission) Noted on recent admission with recommendations to follow-up with gynecology as an outpatient.  Stage 3b chronic kidney disease (CKD) ([place])- (present on admission) Appears to be at baseline. Recent AKI from previous admission appears to be resolved.  Obesity, morbid ([place])- (present on admission) Body mass index is 68.66 kg/m.  Hypertension associated with diabetes ([place])- (present on admission) -Continue home amlodipine  Hypothyroidism -Continue Synthroid 50 mcg daily  Erythema  intertrigo Patient evaluated by wound care. Llarge panus with deep skin folds. Moisture associated. -Keep area dry -Fluconazole PO x 7 days    DVT prophylaxis: Lovenox Code Status:   Code Status: DNR Family Communication: None at bedside Disposition Plan: Discharge to SNF bed availability. Medically stable for discharge   Consultants:  None  Procedures:  None  Antimicrobials: Ceftriaxone IV x1    Subjective: No issues this morning or overnight.  Objective: Vitals:   04/16/21 2014 04/16/21 2050 04/17/21 0504 04/17/21 0915  BP:  (!) 154/87 (!) 152/87   Pulse:  95 93   Resp:  16 18   Temp:  99.7 F (37.6 C) 99.1 F (37.3 C)   TempSrc:  Oral Oral   SpO2: 94% 95% 97% 97%  Weight:      Height:        Intake/Output Summary (Last 24 hours) at 04/17/2021 1005 Last data filed at 04/17/2021 0800 Gross per 24 hour  Intake 360 ml  Output 1600 ml  Net -1240 ml    Filed Weights   04/12/21 1440 04/13/21 2205  Weight: (!) 181.4 kg (!) 158.8 kg    Examination:  General exam: Appears calm and comfortable Respiratory system: Clear to auscultation. Respiratory effort normal. Cardiovascular system: S1 & S2 heard, RRR. Gastrointestinal system: Abdomen is nondistended, soft and nontender. No organomegaly or masses felt. Normal bowel sounds heard. Central nervous system: Alert and oriented. No focal neurological deficits. Musculoskeletal: No edema. No calf tenderness Skin: No cyanosis. No rashes Psychiatry: Judgement and insight appear normal. Mood & affect appropriate.     Data Reviewed: I have personally reviewed following labs and imaging studies  CBC Lab Results  Component Value Date   WBC 8.9 04/14/2021   RBC 4.33 04/14/2021   HGB 11.8 (L) 04/14/2021   HCT  37.2 04/14/2021   MCV 85.9 04/14/2021   MCH 27.3 04/14/2021   PLT 231 04/14/2021   MCHC 31.7 04/14/2021   RDW 14.3 04/14/2021   LYMPHSABS 1.6 04/12/2021   MONOABS 1.5 (H) 04/12/2021   EOSABS 0.0 04/12/2021    BASOSABS 0.0 51/76/1607     Last metabolic panel Lab Results  Component Value Date   NA 130 (L) 04/14/2021   K 4.1 04/14/2021   CL 99 04/14/2021   CO2 21 (L) 04/14/2021   BUN 12 04/14/2021   CREATININE 1.05 (H) 04/14/2021   GLUCOSE 155 (H) 04/14/2021   GFRNONAA >60 04/14/2021   GFRAA 27 (L) 12/24/2019   CALCIUM 8.5 (L) 04/14/2021   PHOS 4.1 12/28/2008   PROT 8.1 04/12/2021   ALBUMIN 3.1 (L) 04/12/2021   BILITOT 0.6 04/12/2021   ALKPHOS 72 04/12/2021   AST 22 04/12/2021   ALT 10 04/12/2021   ANIONGAP 10 04/14/2021    CBG (last 3)  Recent Labs    04/16/21 1634 04/16/21 2054 04/17/21 0730  GLUCAP 217* 157* 151*      GFR: Estimated Creatinine Clearance: 94.2 mL/min (A) (by C-G formula based on SCr of 1.05 mg/dL (H)).  Coagulation Profile: Recent Labs  Lab 04/12/21 1706  INR 1.2     Recent Results (from the past 240 hour(s))  Resp Panel by RT-PCR (Flu A&B, Covid)     Status: None   Collection Time: 04/12/21  5:06 PM   Specimen: Nasopharyngeal(NP) swabs in vial transport medium  Result Value Ref Range Status   SARS Coronavirus 2 by RT PCR NEGATIVE NEGATIVE Final    Comment: (NOTE) SARS-CoV-2 target nucleic acids are NOT DETECTED.  The SARS-CoV-2 RNA is generally detectable in upper respiratory specimens during the acute phase of infection. The lowest concentration of SARS-CoV-2 viral copies this assay can detect is 138 copies/mL. A negative result does not preclude SARS-Cov-2 infection and should not be used as the sole basis for treatment or other patient management decisions. A negative result may occur with  improper specimen collection/handling, submission of specimen other than nasopharyngeal swab, presence of viral mutation(s) within the areas targeted by this assay, and inadequate number of viral copies(<138 copies/mL). A negative result must be combined with clinical observations, patient history, and epidemiological information. The expected  result is Negative.  Fact Sheet for Patients:  EntrepreneurPulse.com.au  Fact Sheet for Healthcare Providers:  IncredibleEmployment.be  This test is no t yet approved or cleared by the [place] FDA and  has been authorized for detection and/or diagnosis of SARS-CoV-2 by FDA under an Emergency Use Authorization (EUA). This EUA will remain  in effect (meaning this test can be used) for the duration of the COVID-19 declaration under Section 564(b)(1) of the Act, 21 U.S.C.section 360bbb-3(b)(1), unless the authorization is terminated  or revoked sooner.       Influenza A by PCR NEGATIVE NEGATIVE Final   Influenza B by PCR NEGATIVE NEGATIVE Final    Comment: (NOTE) The Xpert Xpress SARS-CoV-2/FLU/RSV plus assay is intended as an aid in the diagnosis of influenza from Nasopharyngeal swab specimens and should not be used as a sole basis for treatment. Nasal washings and aspirates are unacceptable for Xpert Xpress SARS-CoV-2/FLU/RSV testing.  Fact Sheet for Patients: EntrepreneurPulse.com.au  Fact Sheet for Healthcare Providers: IncredibleEmployment.be  This test is not yet approved or cleared by the [place] FDA and has been authorized for detection and/or diagnosis of SARS-CoV-2 by FDA under an Emergency Use Authorization (EUA). This EUA  will remain in effect (meaning this test can be used) for the duration of the COVID-19 declaration under Section 564(b)(1) of the Act, 21 U.S.C. section 360bbb-3(b)(1), unless the authorization is terminated or revoked.  Performed at [facility], [place] [address], [place], [place] [address]   Blood culture (routine x 2)     Status: None   Collection Time: 04/12/21  5:06 PM   Specimen: Right Antecubital; Blood  Result Value Ref Range Status   Specimen Description   Final    RIGHT ANTECUBITAL BLOOD Performed at [place] [address] [place], [place] [address]    Special Requests   Final    Blood Culture adequate volume BOTTLES DRAWN AEROBIC AND ANAEROBIC Performed at [place] [address] [place], [place] [address]    Culture   Final    NO GROWTH 5 DAYS Performed at [facility], [place] [address] [place], [place] [address]    Report Status 04/17/2021 FINAL  Final  Blood culture (routine x 2)     Status: None   Collection Time: 04/12/21  5:06 PM   Specimen: BLOOD RIGHT HAND  Result Value Ref Range Status   Specimen Description   Final    BLOOD RIGHT HAND BLOOD Performed at [place] [address], [place], [place] [address]    Special Requests   Final    Blood Culture adequate volume BOTTLES DRAWN AEROBIC AND ANAEROBIC Performed at [place] [address] [place], [place] [address]    Culture   Final    NO GROWTH 5 DAYS Performed at [facility], [place] [address] [place], [place] [address]    Report Status 04/17/2021 FINAL  Final  Urine Culture     Status: Abnormal   Collection Time: 04/13/21 12:00 AM   Specimen: Urine, Clean Catch  Result Value Ref Range Status   Specimen Description   Final    URINE, CLEAN CATCH Performed at [facility], [place] [address], [place], [place] [address]    Special Requests   Final    NONE Performed at [facility], [place] [address] [place],  [address]    Culture MULTIPLE SPECIES PRESENT, SUGGEST RECOLLECTION (A)  Final   Report Status 04/14/2021 FINAL  Final         Radiology Studies: No results found.      Scheduled Meds:  allopurinol  100 mg Oral Daily   amLODipine  5 mg Oral Daily   enoxaparin (LOVENOX) injection  80 mg Subcutaneous Q24H   fluconazole  100 mg Oral Daily   insulin aspart  0-20 Units Subcutaneous TID WC   levothyroxine  50 mcg Oral Q0600   mometasone-formoterol  2 puff Inhalation BID    multivitamin with minerals  1 tablet Oral Daily   Continuous Infusions:     LOS: 0 days     Cordelia Poche, MD [facility] 04/17/2021, 10:05 AM  If 7PM-7AM, please contact night-coverage [email or web address]

## 2021-04-18 DIAGNOSIS — R29898 Other symptoms and signs involving the musculoskeletal system: Secondary | ICD-10-CM | POA: Diagnosis not present

## 2021-04-18 DIAGNOSIS — R9089 Other abnormal findings on diagnostic imaging of central nervous system: Secondary | ICD-10-CM | POA: Diagnosis not present

## 2021-04-18 DIAGNOSIS — E871 Hypo-osmolality and hyponatremia: Secondary | ICD-10-CM | POA: Diagnosis not present

## 2021-04-18 DIAGNOSIS — R8290 Unspecified abnormal findings in urine: Secondary | ICD-10-CM | POA: Diagnosis not present

## 2021-04-18 LAB — BASIC METABOLIC PANEL
Anion gap: 10 (ref 5–15)
BUN: 7 mg/dL (ref 6–20)
CO2: 22 mmol/L (ref 22–32)
Calcium: 8.9 mg/dL (ref 8.9–10.3)
Chloride: 99 mmol/L (ref 98–111)
Creatinine, Ser: 1.13 mg/dL — ABNORMAL HIGH (ref 0.44–1.00)
GFR, Estimated: 58 mL/min — ABNORMAL LOW (ref >60)
Glucose, Bld: 139 mg/dL — ABNORMAL HIGH (ref 70–99)
Potassium: 4.3 mmol/L (ref 3.5–5.1)
Sodium: 131 mmol/L — ABNORMAL LOW (ref 135–145)

## 2021-04-18 LAB — CBC
HCT: 37.1 % (ref 36.0–46.0)
Hemoglobin: 11.7 g/dL — ABNORMAL LOW (ref 12.0–15.0)
MCH: 26.8 pg (ref 26.0–34.0)
MCHC: 31.5 g/dL (ref 30.0–36.0)
MCV: 84.9 fL (ref 80.0–100.0)
Platelets: 245 10*3/uL (ref 150–400)
RBC: 4.37 MIL/uL (ref 3.87–5.11)
RDW: 14.5 % (ref 11.5–15.5)
WBC: 6.6 10*3/uL (ref 4.0–10.5)
nRBC: 0 % (ref 0.0–0.2)

## 2021-04-18 LAB — GLUCOSE, CAPILLARY
Glucose-Capillary: 134 mg/dL — ABNORMAL HIGH (ref 70–99)
Glucose-Capillary: 145 mg/dL — ABNORMAL HIGH (ref 70–99)
Glucose-Capillary: 168 mg/dL — ABNORMAL HIGH (ref 70–99)
Glucose-Capillary: 149 mg/dL — ABNORMAL HIGH (ref 70–99)

## 2021-04-18 NOTE — Progress Notes (Signed)
Occupational Therapy Treatment Patient Details Name: Makayla [name] MRN: [id number] DOB: 12-Apr-1965 Today's Date: 04/18/2021   History of present illness Patient is 56 y.o. female recently hospitalized 03/11/21-03/24/21 at [facility] for hypoglycemia and LE spasms and pain in abdominal area who returned home to pt's Mother's house, but returned to hospital on 04/12/21 with weakness, inability to care for self, and inability to stand or walk.  Pt found with hyponatremia. PMH significant for DMII, CKD III, HTN, HLD, obesity, neuropathy, cellulitis.   OT comments  Patient participated in bathing in chair position in bed with patient able to complete UB bathing with min A with assist needed to clean under pannus. Patient was able to help with reaching minimal LB area with body habitus limiting patient. Patient noted to have continued red discharge between legs. Nursing made aware. Patient would continue to benefit from skilled OT services at this time while admitted and after d/c to address noted deficits in order to improve overall safety and independence in ADLs.     Recommendations for follow up therapy are one component of a multi-disciplinary discharge planning process, led by the attending physician.  Recommendations may be updated based on patient status, additional functional criteria and insurance authorization.    Follow Up Recommendations  Skilled nursing-short term rehab (<3 hours/day)    Assistance Recommended at Discharge Frequent or constant Supervision/Assistance  Patient can return home with the following  Two people to help with walking and/or transfers;Two people to help with bathing/dressing/bathroom   Equipment Recommendations  Other (comment) (bari [facility])    Recommendations for Other Services      Precautions / Restrictions Precautions Precautions: Fall Precaution Comments: bleeding from wounds? Restrictions Weight Bearing Restrictions: No Other Position/Activity Restrictions: WBAT        Mobility Bed Mobility Overal bed mobility: Needs Assistance Bed Mobility: Supine to Sit;Sit to Supine     Supine to sit: HOB elevated;Mod assist;+2 for physical assistance Sit to supine: Max assist;+2 for physical assistance   General bed mobility comments: pt prefers to come into long sitting before swinging BLE over to EOB; utilized bed rails to self assist throughout bed mobility; max A to lift BLE back into bed and use of bed pad and trendelenburg to assist pt back into center of bed and scoot up    Transfers Overall transfer level: Needs assistance Equipment used: Rolling walker (2 wheels) Transfers: Sit to/from Stand Sit to Stand: Mod assist;+2 physical assistance           General transfer comment: assist to rise and steady, pt with posterior lean against bed to remain in standing, fatigued quickly and requested to return to sitting; pt declined attempting standing again (also no bari-recliner in room)     Balance           Standing balance support: During functional activity;Bilateral upper extremity supported;Reliant on assistive device for balance Standing balance-Leahy Scale: Poor                             ADL either performed or assessed with clinical judgement   ADL Overall ADL's : Needs assistance/impaired     Grooming: Wash/dry face;Bed level Grooming Details (indicate cue type and reason): cues to attend to task in chair position in bed. Upper Body Bathing: Minimal assistance Upper Body Bathing Details (indicate cue type and reason): to get L side under pannus. patient was able to reach BUE and underarms and top side of  pannus. Lower Body Bathing: Maximal assistance Lower Body Bathing Details (indicate cue type and reason): patient was able to reach 25% of top of thights with habitus limiting participation. patient was able to reach some between legs as well with habitus impacting further participation.patient was noted to have  blood/red discharge between legs. nurse made aware. Upper Body Dressing : Minimal assistance;Bed level Upper Body Dressing Details (indicate cue type and reason): donning hospital gown with increased time.                        Extremity/Trunk Assessment              Vision       Perception     Praxis      Cognition Arousal/Alertness: Awake/alert Behavior During Therapy: WFL for tasks assessed/performed Overall Cognitive Status: No family/caregiver present to determine baseline cognitive functioning                         Following Commands: Follows one step commands with increased time     Problem Solving: Slow processing;Difficulty sequencing;Requires verbal cues General Comments: patient reported she was going to move into her own house when she leaves the hosptial. patient seems to have poor insight to deficits and is quick to let others compelte tasks for her.          Exercises     Shoulder Instructions       General Comments      Pertinent Vitals/ Pain       Pain Assessment: Faces Faces Pain Scale: Hurts a little bit Pain Location: between legs Pain Descriptors / Indicators: Discomfort Pain Intervention(s): Repositioned;Monitored during session  Home Living                                          Prior Functioning/Environment              Frequency  Min 2X/week        Progress Toward Goals  OT Goals(current goals can now be found in the care plan section)  Progress towards OT goals: Progressing toward goals     Plan Discharge plan remains appropriate    Co-evaluation                 AM-PAC OT "6 Clicks" Daily Activity     Outcome Measure   Help from another person eating meals?: A Little Help from another person taking care of personal grooming?: A Little Help from another person toileting, which includes using toliet, bedpan, or urinal?: Total Help from another person bathing  (including washing, rinsing, drying)?: A Lot Help from another person to put on and taking off regular upper body clothing?: A Lot Help from another person to put on and taking off regular lower body clothing?: Total 6 Click Score: 12    End of Session    OT Visit Diagnosis: Unsteadiness on feet (R26.81);Other symptoms and signs involving cognitive function;Pain;History of falling (Z91.81);Muscle weakness (generalized) (M62.81) Pain - Right/Left: Right Pain - part of body: Ankle and joints of foot   Activity Tolerance Patient tolerated treatment well   Patient Left in bed;with call bell/phone within reach   Nurse Communication Other (comment) (discharge noted on patient)        Time: 1430-1455 OT Time Calculation (min): 25 min  Charges:  OT General Charges $OT Visit: 1 Visit OT Treatments $Self Care/Home Management : 23-37 mins  Jackelyn Poling OTR/L, MS Acute Rehabilitation Department Office# [phone] Pager# [phone]   Marcellina Millin 04/18/2021, 3:15 PM

## 2021-04-18 NOTE — Progress Notes (Signed)
Physical Therapy Treatment Patient Details Name: Makayla [name] MRN: [id number] DOB: 1965/10/03 Today's Date: 04/18/2021   History of Present Illness Patient is 56 y.o. female recently hospitalized 03/11/21-03/24/21 at [facility] for hypoglycemia and LE spasms and pain in abdominal area who returned home to pt's Mother's house, but returned to hospital on 04/12/21 with weakness, inability to care for self, and inability to stand or walk.  Pt found with hyponatremia. PMH significant for DMII, CKD III, HTN, HLD, obesity, neuropathy, cellulitis.    PT Comments    Pt requiring increased assist for bed mobility and standing.  Pt's bed pads soaked with possibly exudate, and pt dripping blood down leg upon standing.  Upon assisting pt back to bed,  checked interdry under pt's abdominal pannus, and both pieces saturated with appearance of darker colored exudate?  RN notified.  Pt would benefit from SNF upon d/c.   Recommendations for follow up therapy are one component of a multi-disciplinary discharge planning process, led by the attending physician.  Recommendations may be updated based on patient status, additional functional criteria and insurance authorization.  Follow Up Recommendations  Skilled nursing-short term rehab (<3 hours/day)     Assistance Recommended at Discharge Frequent or constant Supervision/Assistance  Patient can return home with the following     Equipment Recommendations  Other (comment) (bariatric equipment if home)    Recommendations for Other Services       Precautions / Restrictions Precautions Precautions: Fall Precaution Comments: bleeding from wounds? Restrictions Other Position/Activity Restrictions: WBAT     Mobility  Bed Mobility Overal bed mobility: Needs Assistance Bed Mobility: Supine to Sit;Sit to Supine     Supine to sit: HOB elevated;Mod assist;+2 for physical assistance Sit to supine: Max assist;+2 for physical assistance   General bed mobility comments:  pt prefers to come into long sitting before swinging BLE over to EOB; utilized bed rails to self assist throughout bed mobility; max A to lift BLE back into bed and use of bed pad and trendelenburg to assist pt back into center of bed and scoot up    Transfers Overall transfer level: Needs assistance Equipment used: Rolling walker (2 wheels) Transfers: Sit to/from Stand Sit to Stand: Mod assist;+2 physical assistance           General transfer comment: assist to rise and steady, pt with posterior lean against bed to remain in standing, fatigued quickly and requested to return to sitting; pt declined attempting standing again (also no bari-recliner in room)    Ambulation/Gait                   Stairs             Wheelchair Mobility    Modified Rankin (Stroke Patients Only)       Balance           Standing balance support: During functional activity;Bilateral upper extremity supported;Reliant on assistive device for balance Standing balance-Leahy Scale: Poor                              Cognition Arousal/Alertness: Awake/alert Behavior During Therapy: WFL for tasks assessed/performed Overall Cognitive Status: No family/caregiver present to determine baseline cognitive functioning                         Following Commands: Follows one step commands with increased time     Problem Solving: Slow processing;Difficulty sequencing;Requires  verbal cues General Comments: pt tangential during session, attempting to assist as able however requiring multimodal cues and time to perform        Exercises      General Comments        Pertinent Vitals/Pain Pain Assessment: Faces Faces Pain Scale: Hurts even more Pain Location: right foot Pain Descriptors / Indicators: Grimacing Pain Intervention(s): Repositioned;Monitored during session    Home Living                          Prior Function            PT Goals  (current goals can now be found in the care plan section) Progress towards PT goals: Progressing toward goals    Frequency    Min 2X/week      PT Plan Current plan remains appropriate    Co-evaluation              AM-PAC PT "6 Clicks" Mobility   Outcome Measure  Help needed turning from your back to your side while in a flat bed without using bedrails?: Total Help needed moving from lying on your back to sitting on the side of a flat bed without using bedrails?: Total Help needed moving to and from a bed to a chair (including a wheelchair)?: Total Help needed standing up from a chair using your arms (e.g., wheelchair or bedside chair)?: Total Help needed to walk in hospital room?: Total Help needed climbing 3-5 steps with a railing? : Total 6 Click Score: 6    End of Session   Activity Tolerance: Patient tolerated treatment well Patient left: in bed;with call bell/phone within reach Nurse Communication: Mobility status (bleeding, interdry saturated) PT Visit Diagnosis: Muscle weakness (generalized) (M62.81);Difficulty in walking, not elsewhere classified (R26.2)     Time: 3734-2876 PT Time Calculation (min) (ACUTE ONLY): 32 min  Charges:  $Therapeutic Activity: 23-37 mins                     Jannette Spanner PT, DPT Acute Rehabilitation Services Pager: [phone] Office: [place] 04/18/2021, 12:29 PM

## 2021-04-18 NOTE — Progress Notes (Signed)
Pt weight noted as 350 lb via bed weight

## 2021-04-18 NOTE — Plan of Care (Signed)
  Problem: Pain Managment: Goal: General experience of comfort will improve Outcome: Progressing   Problem: Safety: Goal: Ability to remain free from injury will improve Outcome: Progressing   

## 2021-04-18 NOTE — TOC Progression Note (Signed)
Transition of Care [facility]) - Progression Note    Patient Details  Name: Makayla [name] MRN: [id number] Date of Birth: 1965/10/14  Transition of Care [facility]) CM/SW Contact  Purcell Mouton, RN Phone Number: 04/18/2021, 3:03 PM  Clinical Narrative:    Per RN bed Scales show pt's wt at 350 lbs.         Expected Discharge Plan and Services                                                 Social Determinants of Health (SDOH) Interventions    Readmission Risk Interventions No flowsheet data found.

## 2021-04-18 NOTE — TOC Progression Note (Signed)
Transition of Care [facility]) - Progression Note    Patient Details  Name: Makayla [name] MRN: [id number] Date of Birth: 04/28/1965  Transition of Care [facility]) CM/SW Contact  Purcell Mouton, RN Phone Number: 04/18/2021, 4:27 PM  Clinical Narrative:     [name] sister and pt accepted bed offer from [facility]. Insurance auth started.       Expected Discharge Plan and Services                                                 Social Determinants of Health (SDOH) Interventions    Readmission Risk Interventions No flowsheet data found.

## 2021-04-18 NOTE — TOC Progression Note (Addendum)
Transition of Care [facility]) - Progression Note    Patient Details  Name: Makayla [name] MRN: [id number] Date of Birth: 09/25/1965  Transition of Care [facility]) CM/SW Contact  Purcell Mouton, RN Phone Number: 04/18/2021, 10:10 AM  Clinical Narrative:     [facility] related to not having any beds.       Expected Discharge Plan and Services                                                 Social Determinants of Health (SDOH) Interventions    Readmission Risk Interventions No flowsheet data found.

## 2021-04-18 NOTE — Progress Notes (Addendum)
Patient with heavy bright red vaginal bleeding and clots. Patient unable to tell me when the bleeding started. Patient does not associate pain with the bleeding. Vitals are stable, patient in no acute distress. On call provider made aware. New orders placed. Will continue to monitor closely.

## 2021-04-18 NOTE — TOC Progression Note (Signed)
Transition of Care [facility]) - Progression Note    Patient Details  Name: Makayla [name] MRN: [id number] Date of Birth: [birth year]  Transition of Care [facility]) CM/SW Contact  Purcell Mouton, RN Phone Number: 04/18/2021, 9:22 AM  Clinical Narrative:    A call was made to several SNF's in the surrounding area for a SNF bed closer to pt's home. A call was made to the two facilities that offered a bed, will call back after  9:30 AM to confirm a bed. Pt's sister is aware.        Expected Discharge Plan and Services                                                 Social Determinants of Health (SDOH) Interventions    Readmission Risk Interventions No flowsheet data found.

## 2021-04-18 NOTE — Progress Notes (Signed)
PROGRESS NOTE    Makayla [name]  ION:[id number] DOB: [birth year] DOA: 04/12/2021 PCP: Arthur Holms, NP   Brief Narrative: Makayla [name] is a 56 y.o. female with medical history significant of recent cellulitis, CKD stage IIIb, hypertension, neuropathy, diabetes mellitus, obesity. Patient presented at the suggestion of her outpatient practitioner secondary to ongoing weakness and inability to be cared for at home. She was found to have mild hyponatremia which has been improved with IV fluids. Plan for discharge to SNF.   Assessment & Plan:   * Hyponatremia Concern for hypovolemic hyponatremia on admission. Patient recently admitted and found to have hyponatremia at that time with associated AKI. Sodium of 125 on admission which has improved to 130s with IV fluids and is stable.  Bilateral leg weakness Chronic issue for several decades. MRI with multilevel degenerative disease. No cord compression noted. Some left sided weakness and numbness associated. Discussed with neurosurgery; no management recommendations at this time. PT/OT recommending SNF. -PT/OT  Diabetes mellitus without complication ([place]) Hemoglobin A1C of 6% from 03/2021 -SSI while inpatient  Abnormal brain MRI Atrophy greater than expected for age. Recommend outpatient neurology follow-up.  Abnormal urinalysis- (present on admission) No associated urinary symptoms. Ceftriaxone given in the ED. Urine culture with multiple species.  Ovarian mass, left- (present on admission) Noted on recent admission with recommendations to follow-up with gynecology as an outpatient.  Stage 3b chronic kidney disease (CKD) ([place])- (present on admission) Appears to be at baseline. Recent AKI from previous admission appears to be resolved.  Obesity, morbid ([place])- (present on admission) Body mass index is 68.66 kg/m.  Dietitian recommendations (04/16/2021): Multivitamin w/ minerals daily  Hypertension associated with diabetes ([place])- (present on  admission) -Continue home amlodipine  Hypothyroidism -Continue Synthroid 50 mcg daily  Erythema intertrigo Patient evaluated by wound care. Llarge panus with deep skin folds. Moisture associated. -Keep area dry -Fluconazole PO x 7 days (Day 5/7 today)    DVT prophylaxis: Lovenox Code Status:   Code Status: DNR Family Communication: None at bedside Disposition Plan: Discharge to SNF bed availability. Medically stable for discharge   Consultants:  None  Procedures:  None  Antimicrobials: Ceftriaxone IV x1    Subjective: No concerns today. Wondering about discharge plans.  Objective: Vitals:   04/17/21 1142 04/17/21 2033 04/17/21 2103 04/18/21 0346  BP: (!) 145/65 134/69  138/78  Pulse: 96 96  93  Resp: 19 19  19   Temp: 99.2 F (37.3 C) 99.3 F (37.4 C)  99.2 F (37.3 C)  TempSrc: Oral Oral  Oral  SpO2: 92% 99% 98% 97%  Weight:      Height:        Intake/Output Summary (Last 24 hours) at 04/18/2021 0810 Last data filed at 04/18/2021 0400 Gross per 24 hour  Intake 600 ml  Output --  Net 600 ml    Filed Weights   04/12/21 1440 04/13/21 2205  Weight: (!) 181.4 kg (!) 158.8 kg    Examination:  General exam: Appears calm and comfortable Respiratory system: Clear to auscultation. Respiratory effort normal. Cardiovascular system: S1 & S2 heard, RRR. No murmurs, rubs, gallops or clicks. Gastrointestinal system: Abdomen is nondistended, soft and nontender. No organomegaly or masses felt. Normal bowel sounds heard. Central nervous system: Alert and oriented. No focal neurological deficits but has generalized weakness which is unchanged Musculoskeletal: No edema. No calf tenderness Skin: No cyanosis. No rashes Psychiatry: Judgement and insight appear normal. Mood & affect appropriate.     Data Reviewed: I have  personally reviewed following labs and imaging studies  CBC Lab Results  Component Value Date   WBC 6.6 04/18/2021   RBC 4.37 04/18/2021   HGB  11.7 (L) 04/18/2021   HCT 37.1 04/18/2021   MCV 84.9 04/18/2021   MCH 26.8 04/18/2021   PLT 245 04/18/2021   MCHC 31.5 04/18/2021   RDW 14.5 04/18/2021   LYMPHSABS 1.6 04/12/2021   MONOABS 1.5 (H) 04/12/2021   EOSABS 0.0 04/12/2021   BASOSABS 0.0 26/37/8588     Last metabolic panel Lab Results  Component Value Date   NA 131 (L) 04/18/2021   K 4.3 04/18/2021   CL 99 04/18/2021   CO2 22 04/18/2021   BUN 7 04/18/2021   CREATININE 1.13 (H) 04/18/2021   GLUCOSE 139 (H) 04/18/2021   GFRNONAA 58 (L) 04/18/2021   GFRAA 27 (L) 12/24/2019   CALCIUM 8.9 04/18/2021   PHOS 4.1 12/28/2008   PROT 8.1 04/12/2021   ALBUMIN 3.1 (L) 04/12/2021   BILITOT 0.6 04/12/2021   ALKPHOS 72 04/12/2021   AST 22 04/12/2021   ALT 10 04/12/2021   ANIONGAP 10 04/18/2021    CBG (last 3)  Recent Labs    04/17/21 1659 04/17/21 2036 04/18/21 0705  GLUCAP 166* 162* 134*      GFR: Estimated Creatinine Clearance: 87.5 mL/min (A) (by C-G formula based on SCr of 1.13 mg/dL (H)).  Coagulation Profile: Recent Labs  Lab 04/12/21 1706  INR 1.2     Recent Results (from the past 240 hour(s))  Resp Panel by RT-PCR (Flu A&B, Covid)     Status: None   Collection Time: 04/12/21  5:06 PM   Specimen: Nasopharyngeal(NP) swabs in vial transport medium  Result Value Ref Range Status   SARS Coronavirus 2 by RT PCR NEGATIVE NEGATIVE Final    Comment: (NOTE) SARS-CoV-2 target nucleic acids are NOT DETECTED.  The SARS-CoV-2 RNA is generally detectable in upper respiratory specimens during the acute phase of infection. The lowest concentration of SARS-CoV-2 viral copies this assay can detect is 138 copies/mL. A negative result does not preclude SARS-Cov-2 infection and should not be used as the sole basis for treatment or other patient management decisions. A negative result may occur with  improper specimen collection/handling, submission of specimen other than nasopharyngeal swab, presence of viral  mutation(s) within the areas targeted by this assay, and inadequate number of viral copies(<138 copies/mL). A negative result must be combined with clinical observations, patient history, and epidemiological information. The expected result is Negative.  Fact Sheet for Patients:  EntrepreneurPulse.com.au  Fact Sheet for Healthcare Providers:  IncredibleEmployment.be  This test is no t yet approved or cleared by the [place] FDA and  has been authorized for detection and/or diagnosis of SARS-CoV-2 by FDA under an Emergency Use Authorization (EUA). This EUA will remain  in effect (meaning this test can be used) for the duration of the COVID-19 declaration under Section 564(b)(1) of the Act, 21 U.S.C.section 360bbb-3(b)(1), unless the authorization is terminated  or revoked sooner.       Influenza A by PCR NEGATIVE NEGATIVE Final   Influenza B by PCR NEGATIVE NEGATIVE Final    Comment: (NOTE) The Xpert Xpress SARS-CoV-2/FLU/RSV plus assay is intended as an aid in the diagnosis of influenza from Nasopharyngeal swab specimens and should not be used as a sole basis for treatment. Nasal washings and aspirates are unacceptable for Xpert Xpress SARS-CoV-2/FLU/RSV testing.  Fact Sheet for Patients: EntrepreneurPulse.com.au  Fact Sheet for Healthcare Providers: IncredibleEmployment.be  This test is not yet approved or cleared by the [place] and has been authorized for detection and/or diagnosis of SARS-CoV-2 by FDA under an Emergency Use Authorization (EUA). This EUA will remain in effect (meaning this test can be used) for the duration of the COVID-19 declaration under Section 564(b)(1) of the Act, 21 U.S.C. section 360bbb-3(b)(1), unless the authorization is terminated or revoked.  Performed at [facility], [place] [address] [place], [place] [address]   Blood culture (routine  x 2)     Status: None   Collection Time: 04/12/21  5:06 PM   Specimen: Right Antecubital; Blood  Result Value Ref Range Status   Specimen Description   Final    RIGHT ANTECUBITAL BLOOD Performed at [facility] [address] [place], [place] [address]    Special Requests   Final    Blood Culture adequate volume BOTTLES DRAWN AEROBIC AND ANAEROBIC Performed at [facility] [address], [place], [place] [address]    Culture   Final    NO GROWTH 5 DAYS Performed at [facility], [place] [address], [place], [place] [address]    Report Status 04/17/2021 FINAL  Final  Blood culture (routine x 2)     Status: None   Collection Time: 04/12/21  5:06 PM   Specimen: BLOOD RIGHT HAND  Result Value Ref Range Status   Specimen Description   Final    BLOOD RIGHT HAND BLOOD Performed at [facility] [address], [place], [place] [address]    Special Requests   Final    Blood Culture adequate volume BOTTLES DRAWN AEROBIC AND ANAEROBIC Performed at [facility] [address] [place], [place] [address]    Culture   Final    NO GROWTH 5 DAYS Performed at [facility], [place] [address], [place], [place] [address]    Report Status 04/17/2021 FINAL  Final  Urine Culture     Status: Abnormal   Collection Time: 04/13/21 12:00 AM   Specimen: Urine, Clean Catch  Result Value Ref Range Status   Specimen Description   Final    URINE, CLEAN CATCH Performed at [facility], [place] [address] [place], [place] [address]    Special Requests   Final    NONE Performed at [facility], [place] [address], [place], [place] [address]    Culture MULTIPLE SPECIES PRESENT, SUGGEST RECOLLECTION (A)  Final   Report Status 04/14/2021 FINAL  Final         Radiology Studies: No results found.      Scheduled Meds:  allopurinol  100 mg Oral Daily   amLODipine  5 mg Oral Daily    enoxaparin (LOVENOX) injection  80 mg Subcutaneous Q24H   fluconazole  100 mg Oral Daily   insulin aspart  0-20 Units Subcutaneous TID WC   levothyroxine  50 mcg Oral Q0600   mometasone-formoterol  2 puff Inhalation BID   multivitamin with minerals  1 tablet Oral Daily   Continuous Infusions:     LOS: 0 days     Cordelia Poche, MD [facility] 04/18/2021, 8:10 AM  If 7PM-7AM, please contact night-coverage [email or web address]

## 2021-04-19 DIAGNOSIS — E871 Hypo-osmolality and hyponatremia: Secondary | ICD-10-CM | POA: Diagnosis not present

## 2021-04-19 DIAGNOSIS — L8990 Pressure ulcer of unspecified site, unspecified stage: Secondary | ICD-10-CM | POA: Insufficient documentation

## 2021-04-19 LAB — GLUCOSE, CAPILLARY
Glucose-Capillary: 210 mg/dL — ABNORMAL HIGH (ref 70–99)
Glucose-Capillary: 173 mg/dL — ABNORMAL HIGH (ref 70–99)
Glucose-Capillary: 209 mg/dL — ABNORMAL HIGH (ref 70–99)

## 2021-04-19 MED ORDER — ADULT MULTIVITAMIN W/MINERALS CH: 1.0000 | ORAL_TABLET | Freq: Every day | ORAL | 0 refills | Status: AC

## 2021-04-19 NOTE — TOC Progression Note (Signed)
Transition of Care [facility]) - Progression Note    Patient Details  Name: Makayla [name] MRN: [id number] Date of Birth: [birth year]  [facility] CM/SW Contact  Purcell Mouton, RN Phone Number: 04/19/2021, 12:09 PM  Clinical Narrative:    SNF called waiting for DME for pt to be delivered.         Expected Discharge Plan and Services           Expected Discharge Date: 04/19/21                                     Social Determinants of Health (SDOH) Interventions    Readmission Risk Interventions No flowsheet data found.

## 2021-04-19 NOTE — Progress Notes (Signed)
Provided discharge instruction packet to receiving facility. IV removed intact.  Makayla [name]

## 2021-04-19 NOTE — Progress Notes (Signed)
Called report to [facility], [phone]. Jerene Pitch

## 2021-04-19 NOTE — Discharge Summary (Signed)
Physician Discharge Summary  Makayla [name] ZGY:[id number] DOB: [birth year] DOA: 04/12/2021  PCP: Vonna Drafts, FNP  Admit date: 04/12/2021 Discharge date: 04/19/2021  Recommendations for Outpatient Follow-up:  Discharge to SNF for rehab prior to returning home. Follow up with PCP in 7-10 days after discharge from rehab. Chemistry to be checked at facility in one week. Report results to facility physician. Pt will need to follow up with OB/Gyn when discharged from facility to follow up on ovarian mass.   Contact information for after-discharge care     Destination     [facility] .   Service: Skilled Nursing Contact information: [place] [phone]                    Discharge Diagnoses: Principal diagnosis is #1 Hyponatremia Bilateral lower extremity weakness DM II without complication Abnormal brain MRI - Atrophy greater than expected for age. UTI - treated Left ovarian mass CKD IIIb Super morbid obesity with BMI 68.66 Hypertension Hypothyroidism Erythema intertrigo - moisture associated  Discharge Condition: Fair Disposition: SNF  Diet recommendation: Heart healthy with modified carbohydrates.  Filed Weights   04/12/21 1440 04/13/21 2205 04/18/21 1500  Weight: (!) 181.4 kg (!) 158.8 kg (!) 158.8 kg    History of present illness:  Makayla [name] is a 56 y.o. female with medical history significant of recent cellulitis, CKD stage IIIb, hypertension, neuropathy, diabetes mellitus, obesity. Patient presented at the suggestion of her outpatient practitioner secondary to ongoing weakness and inability to be cared for at home. Patient was recently admitted and treated for abdominal cellulitis and fall. After discharge, she and her family have had significant trouble finding the means to properly care for the patient. Care is complicated by patient's weakness and body habitus.   ED Course: Vitals: Temperature of  99.5 F, pulse of 106, respirations of 20 breaths per minute, BP of 141/77, SpO2 of 98% on room air Labs: Sodium of 125 > 127, chloride of 94, Creatinine of 1.42 > 1.3, albumin of 3.1, WBC of 13,200 Imaging: MRI brain without acute process but does show advanced cerebral atrophy consistent with possible trauma vs ischemia. MRI cervical/thoracic/lumbar spine significant for multilevel spinal stenosis/degenerative changes Medications/Course: Ceftriaxone IV, 500 mL NS bolus   Hospital Course:  Makayla [name] is a 56 y.o. female with medical history significant of recent cellulitis, CKD stage IIIb, hypertension, neuropathy, diabetes mellitus, obesity. Patient presented at the suggestion of her outpatient practitioner secondary to ongoing weakness and inability to be cared for at home. She was found to have mild hyponatremia which has been improved with IV fluids.   The patient will be discharged to SNF today in fair condition.  Today's assessment: S: The patient is resting comfortably. No new complaints.  O: Vitals:  Vitals:   04/19/21 0831 04/19/21 0852  BP:  134/71  Pulse:    Resp:    Temp:    SpO2: 95%     Constitutional:  Appears calm and comfortable Respiratory:  CTA bilaterally, no w/r/r.  Respiratory effort normal. No retractions or accessory muscle use Cardiovascular:  RRR, no m/r/g No LE extremity edema   Normal pedal pulses Abdomen:  Abdomen appears normal; no tenderness or masses No hernias No HSM Musculoskeletal:  Digits/nails: no clubbing, cyanosis, petechiae, infection exam of joints, bones, muscles of at least one of following: head/neck, RUE, LUE, RLE, LLE   strength and tone normal, no atrophy, no abnormal movements No tenderness, masses  Normal ROM, no contractures  gait and station Skin:  No rashes palpation of skin: no induration or nodules There is erythema in the skin folds of the patient's abdomen and groin. Neurologic:  CN 2-12 intact Sensation all 4  extremities intact Psychiatric:  judgement and insight appear normal Mental status Mood, affect appropriate Orientation to person, place, time     Discharge Instructions  Discharge Instructions     Activity as tolerated - No restrictions   Complete by: As directed    Call MD for:  persistant nausea and vomiting   Complete by: As directed    Call MD for:  redness, tenderness, or signs of infection (pain, swelling, redness, odor or green/yellow discharge around incision site)   Complete by: As directed    Call MD for:  severe uncontrolled pain   Complete by: As directed    Call MD for:  temperature >100.4   Complete by: As directed    Diet - low sodium heart healthy   Complete by: As directed    Diet Carb Modified   Complete by: As directed    Discharge instructions   Complete by: As directed    Discharge to SNF for rehab prior to returning home. Follow up with PCP in 7-10 days after discharge from rehab. Chemistry to be checked at facility in one week. Report results to facility physician. Pt will need to follow up with OB/Gyn when discharged from facility to follow up on ovarian mass.   Discharge wound care:   Complete by: As directed    Purwick device to keep area dry. Bariatric bed Prevalon boots Interdry Ag+ to intertrigonous areas Complete course of difllucan. Mobility is important.   Increase activity slowly   Complete by: As directed       Allergies as of 04/19/2021       Reactions   Penicillins Shortness Of Breath   Inflates bronchitis   Ibuprofen Other (See Comments)   Can not take due to kidney problems.    Peach Flavor Swelling   Peaches.    Strawberry Extract Swelling        Medication List     STOP taking these medications    chlorzoxazone 500 MG tablet Commonly known as: PARAFON   colchicine 0.6 MG tablet       TAKE these medications    acetaminophen 325 MG tablet Commonly known as: TYLENOL Take 650 mg by mouth every 6 (six)  hours as needed for moderate pain.   albuterol 108 (90 Base) MCG/ACT inhaler Commonly known as: VENTOLIN HFA Inhale 2 puffs into the lungs every 6 (six) hours as needed for wheezing or shortness of breath. For shortness of breath   allopurinol 100 MG tablet Commonly known as: ZYLOPRIM Take 100 mg by mouth daily.   amLODipine 5 MG tablet Commonly known as: NORVASC Take 1 tablet by mouth daily.   B-D UF III MINI PEN NEEDLES 31G X 5 MM Misc Generic drug: Insulin Pen Needle See admin instructions.   cyclobenzaprine 5 MG tablet Commonly known as: FLEXERIL Take 1 tablet twice a day as needed for Muscle spasms. What changed:  how much to take how to take this when to take this reasons to take this additional instructions   furosemide 20 MG tablet Commonly known as: LASIX Take 20 mg by mouth daily.   glipiZIDE 5 MG tablet Commonly known as: GLUCOTROL Take 5 mg by mouth 2 (two) times daily before a meal.   glucose blood  test strip Commonly known as: OneTouch Verio Use as instructed to check blood sugar 2 times daily Dx code E11.9   Insulin Syringe-Needle U-100 29G X 1/2" 0.3 ML Misc Commonly known as: SAFETY-GLIDE 0.3CC SYR 29GX1/2 Use as directed.   levothyroxine 50 MCG tablet Commonly known as: SYNTHROID Take 50 mcg by mouth daily before breakfast.   multivitamin with minerals Tabs tablet Take 1 tablet by mouth daily. Start taking on: April 20, 2021   omeprazole 20 MG capsule Commonly known as: PRILOSEC Take 20 mg by mouth daily before breakfast.   rosuvastatin 10 MG tablet Commonly known as: CRESTOR Take 10 mg by mouth at bedtime.   Symbicort 160-4.5 MCG/ACT inhaler Generic drug: budesonide-formoterol Inhale 2 puffs into the lungs 2 (two) times daily.   Tyler Aas FlexTouch 200 UNIT/ML FlexTouch Pen Generic drug: insulin degludec Inject 56 Units into the skin in the morning and at bedtime.   Victoza 18 MG/3ML Sopn Generic drug: liraglutide Inject 0.6 mg  into the skin 2 (two) times daily.               Discharge Care Instructions  (From admission, onward)           Start     Ordered   04/19/21 0000  Discharge wound care:       Comments: Purwick device to keep area dry. Bariatric bed Prevalon boots Interdry Ag+ to intertrigonous areas Complete course of difllucan. Mobility is important.   04/19/21 1049           Allergies  Allergen Reactions   Penicillins Shortness Of Breath    Inflates bronchitis    Ibuprofen Other (See Comments)    Can not take due to kidney problems.    Peach Flavor Swelling    Peaches.    Strawberry Extract Swelling    The results of significant diagnostics from this hospitalization (including imaging, microbiology, ancillary and laboratory) are listed below for reference.    Significant Diagnostic Studies: CT ABDOMEN PELVIS WO CONTRAST  Result Date: 03/21/2021 CLINICAL DATA:  Sepsis, abdominal pain EXAM: CT ABDOMEN AND PELVIS WITHOUT CONTRAST TECHNIQUE: Multidetector CT imaging of the abdomen and pelvis was performed following the standard protocol without IV contrast. Unenhanced CT was performed per clinician order. Lack of IV contrast limits sensitivity and specificity, especially for evaluation of abdominal/pelvic solid viscera. COMPARISON:  None. FINDINGS: Lower chest: No acute pleural or parenchymal lung disease. Hepatobiliary: Calcified gallstones without cholecystitis. Unremarkable unenhanced appearance of the liver. Pancreas: Unremarkable unenhanced appearance. Spleen: Unremarkable unenhanced appearance. Adrenals/Urinary Tract: No urinary tract calculi or obstructive uropathy. The adrenals are unremarkable. There is moderate distention of the bladder without filling defect. Stomach/Bowel: No bowel obstruction or ileus. Normal appendix right lower quadrant. No bowel wall thickening or inflammatory change. Vascular/Lymphatic: No significant vascular findings are present. No enlarged  abdominal or pelvic lymph nodes. Reproductive: Intrauterine calcifications most consistent with degenerating fibroids. There is a large left ovarian mass containing macroscopic fat and calcification, measuring 11.0 x 12.4 x 9.3 cm consistent with ovarian dermoid. No right adnexal mass. Other: No free fluid or free gas.  No abdominal wall hernia. Musculoskeletal: There are no acute bony abnormalities. There is severe bilateral hip osteoarthritis, left greater than right. Imaging was performed through the perineum, with no inflammatory changes in the perineum or labia to suggest infection. However, there is subcutaneous fat stranding and dermal thickening within the inferior margin of the abdominal pannus, not completely imaged on this study, which could reflect abdominal wall  cellulitis. No fluid collection or evidence of abscess. Reconstructed images demonstrate no additional findings. IMPRESSION: 1. 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 Maximal dimension 12.4 cm. 2. Subcutaneous fat stranding and dermal thickening within the inferior margin of the abdominal wall pannus, which could reflect cellulitis. No fluid collection or abscess. 3. Cholelithiasis without cholecystitis. 4. Fibroid uterus. 5. Severe bilateral hip osteoarthritis, left greater than right. No acute fractures. Electronically Signed   By: Randa Ngo M.D.   On: 03/21/2021 17:28   DG Chest 2 View  Result Date: 03/21/2021 CLINICAL DATA:  Short of breath, fell yesterday, back pain EXAM: CHEST - 2 VIEW COMPARISON:  12/08/2012 FINDINGS: Frontal and lateral views of the chest demonstrate an unremarkable cardiac silhouette. No acute airspace disease, effusion, or pneumothorax. There are no acute bony abnormalities. IMPRESSION: 1. No acute intrathoracic process. Electronically Signed   By: Randa Ngo M.D.   On: 03/21/2021 15:17   CT HEAD WO CONTRAST  Result Date: 04/12/2021 CLINICAL DATA:  Neuro deficit. EXAM: CT HEAD WITHOUT  CONTRAST TECHNIQUE: Contiguous axial images were obtained from the base of the skull through the vertex without intravenous contrast. COMPARISON:  None. FINDINGS: Brain: No evidence of acute infarction, hemorrhage, hydrocephalus, extra-axial collection or mass lesion/mass effect. Bilateral anterior temporal lobe atrophy. Vascular: Atherosclerotic vascular calcification of the carotid siphons. No hyperdense vessel. Skull: Normal. Negative for fracture or focal lesion. Sinuses/Orbits: No acute finding. Other: None. IMPRESSION: 1. No acute intracranial abnormality. Electronically Signed   By: Titus Dubin M.D.   On: 04/12/2021 17:08   MR BRAIN WO CONTRAST  Result Date: 04/13/2021 CLINICAL DATA:  Initial evaluation for neuro deficit, stroke suspected. EXAM: MRI HEAD WITHOUT CONTRAST TECHNIQUE: Multiplanar, multiecho pulse sequences of the brain and surrounding structures were obtained without intravenous contrast. COMPARISON:  Head CT from earlier the same day. FINDINGS: Brain: Examination moderately degraded by motion artifact. Diffuse prominence of the CSF containing spaces compatible with cerebral atrophy, advanced for age. Changes are most pronounced at the anterior temporal poles bilaterally where there is fairly pronounced atrophy/encephalomalacia (series 8, image 9). Associated T2/FLAIR hyperintensity seen within this region. Finding is nonspecific, and could possibly related to prior trauma. Changes of CADASIL or possibly chronic microvascular ischemic disease could also be considered. No abnormal foci of restricted diffusion to suggest acute or subacute ischemia. Gray-white matter differentiation otherwise maintained. No other areas of chronic cortical infarction. No visible evidence for acute or chronic intracranial hemorrhage. No mass lesion, mass effect or midline shift. No hydrocephalus or extra-axial fluid collection. Pituitary gland suprasellar region within normal limits. Midline structures intact.  Vascular: Right vertebral artery hypoplastic and not well seen. Major intracranial vascular flow voids are otherwise maintained. Skull and upper cervical spine: Craniocervical junction within normal limits. Bone marrow signal intensity diffusely decreased on T1 weighted sequence, nonspecific, but most commonly related to anemia, smoking or obesity. No focal marrow replacing lesion. No scalp soft tissue abnormality. Sinuses/Orbits: Globes orbital soft tissues demonstrate no acute finding. Paranasal sinuses are largely clear. No significant mastoid effusion. Other: None. IMPRESSION: 1. No acute intracranial abnormality. 2. Advanced cerebral atrophy for age, with fairly pronounced atrophy/encephalomalacia involving the anterior temporal poles bilaterally. Finding is nonspecific, and could possibly related to prior trauma or ischemia. Changes of CADASIL could also be considered. Electronically Signed   By: Jeannine Boga M.D.   On: 04/13/2021 00:05   MR Cervical Spine Wo Contrast  Result Date: 04/13/2021 CLINICAL DATA:  Initial evaluation for acute myelopathy. EXAM: MRI CERVICAL  SPINE WITHOUT CONTRAST TECHNIQUE: Multiplanar, multisequence MR imaging of the cervical spine was performed. No intravenous contrast was administered. COMPARISON:  None available. FINDINGS: Alignment: Examination technically limited due to habitus and extensive motion artifact, limiting assessment. Straightening of the normal cervical lordosis. No significant listhesis. Vertebrae: Vertebral body height maintained without acute or chronic fracture. Bone marrow signal intensity diffusely decreased on T1 weighted sequence, nonspecific, but most commonly related to anemia, smoking or obesity. No worrisome osseous lesions. No visible abnormal marrow edema. Cord: Signal intensity within the cervical spinal cord is grossly within normal limits. No obvious cord signal abnormality, although evaluation limited by motion. Posterior Fossa,  vertebral arteries, paraspinal tissues: Paraspinous soft tissues within normal limits. Normal flow voids seen within the vertebral arteries bilaterally. Disc levels: C2-C3: Grossly negative. C3-C4: Left paracentral to subarticular disc protrusion indents the left ventral thecal sac (series 8, image 7). Mild flattening of the left hemi cord without definite cord signal changes. Up to moderate left-sided spinal stenosis. Foramina are grossly patent, although evaluation limited by motion. C4-C5: Mild disc bulge. No definite significant spinal stenosis. Foramina grossly patent, although evaluation limited by motion. C5-C6: Disc bulge with endplate and uncovertebral spurring. Secondary flattening of the ventral thecal sac with no more than mild spinal stenosis. No obvious cord impingement. Foramina are grossly patent allowing for motion. C6-C7: Disc bulge with endplate and uncovertebral spurring, slightly asymmetric to the left. No more than mild spinal stenosis. Evaluation of the foramina is severely limited by motion, although there is suspected at least mild spinal stenosis on the left. Right neural foramen is grossly patent. C7-T1: Minimal disc bulge. No appreciable spinal stenosis. Foramina are grossly patent. IMPRESSION: 1. Technically limited exam due to habitus and extensive motion artifact. 2. No definite acute abnormality within the cervical spine. No obvious cord signal changes on this limited exam. 3. Left paracentral to subarticular disc protrusion at C3-4 with resultant mild flattening of the left hemi cord and suspected moderate left-sided spinal stenosis. 4. Left eccentric disc bulge with uncovertebral spurring at C6-7 with resultant at least mild spinal stenosis. 5. Additional mild noncompressive disc bulging at C4-5, C5-6, and C7-T1 without appreciable stenosis. Foramina are grossly patent, although evaluation is severely limited by motion artifact. Electronically Signed   By: Jeannine Boga M.D.    On: 04/13/2021 00:13   MR THORACIC SPINE WO CONTRAST  Result Date: 04/13/2021 CLINICAL DATA:  Initial evaluation for acute myelopathy. EXAM: MRI THORACIC SPINE WITHOUT CONTRAST TECHNIQUE: Multiplanar, multisequence MR imaging of the thoracic spine was performed. No intravenous contrast was administered. COMPARISON:  None available. FINDINGS: Alignment: Examination severely degraded by motion and habitus. Additionally, patient was unable to tolerate the full length of the exam. Axial sequences are incomplete, with only a portion of the upper thoracic spine visualized. Dextroscoliosis. Alignment otherwise normal preservation of the normal thoracic kyphosis. No listhesis. Vertebrae: Vertebral body height maintained without acute or chronic fracture. Bone marrow signal intensity diffusely decreased on T1 weighted sequence, nonspecific, but most commonly related to anemia, smoking or obesity. No worrisome osseous lesions. No abnormal marrow edema. Cord: Grossly normal signal intensity throughout the thoracic spinal cord. No visible cord signal changes on this limited study. Paraspinal and other soft tissues: Visualized paraspinous soft tissues unremarkable. Disc levels: T2-3: Small central to left central disc protrusion indents the ventral thecal sac (series 19, image 7). No significant stenosis. T7-8: Small central disc protrusion with inferior migration (series 19, image 5). Mild spinal stenosis at this level with probable  minimal flattening of the ventral cord, but no visible cord signal changes. Foramina remain patent. Otherwise, no other significant disc pathology seen within the thoracic spine. No other visible spinal stenosis. Foramina remain largely patent. IMPRESSION: 1. Technically limited exam due to extensive motion artifact and patient's inability to tolerate the full length of the study. 2. No definite acute abnormality within the thoracic spine. No appreciable cord signal abnormality on this limited  exam. 3. Small disc protrusions at T2-3 and T7-8 with resultant mild spinal stenosis at the T7-8 level. No other significant stenosis within the thoracic spine. Electronically Signed   By: Jeannine Boga M.D.   On: 04/13/2021 00:23   MR LUMBAR SPINE WO CONTRAST  Result Date: 04/13/2021 CLINICAL DATA:  Initial evaluation for acute myelopathy. EXAM: MRI LUMBAR SPINE WITHOUT CONTRAST TECHNIQUE: Multiplanar, multisequence MR imaging of the lumbar spine was performed. No intravenous contrast was administered. COMPARISON:  None available. FINDINGS: Segmentation: Examination technically limited by habitus and motion artifact. Standard segmentation. Lowest well-formed disc space labeled the L5-S1 level. Alignment: Exaggeration of the normal lumbar lordosis. Trace anterolisthesis of L4 on L5, likely chronic and facet mediated. Underlying trace scoliosis. Vertebrae: Vertebral body height maintained without acute or chronic fracture. Bone marrow signal intensity diffusely decreased on T1 weighted sequence, nonspecific, but most commonly related to anemia, smoking or obesity. No worrisome osseous lesions. No significant abnormal marrow edema. Conus medullaris and cauda equina: Conus extends to approximately the L1 level. Conus medullaris and nerve roots of the cauda equina are grossly normal. Paraspinal and other soft tissues: Mild diffuse edema present within the lower posterior paraspinous musculature at L4 through S1. Findings suspected to be related to adjacent facet arthritis, although muscular injury/strain could also have this appearance (series 11, images 6, 12). Paraspinous soft tissues demonstrate no other visible acute abnormality. Partially visualized urinary bladder is moderately distended. Visualized visceral structures otherwise grossly unremarkable. Disc levels: L1-2:  Unremarkable. L2-3: Mild annular disc bulge. No significant spinal stenosis. Foramina remain patent. L3-4: Mild annular disc bulge.  Probable mild facet hypertrophy. No significant spinal stenosis. Foramina remain grossly patent. L4-5: Mild disc bulge with disc desiccation. Moderate right worse than left facet hypertrophy. Resultant mild-to-moderate canal with bilateral lateral recess stenosis. Mild left with moderate right L4 foraminal narrowing. L5-S1: Disc desiccation with diffuse disc bulge. Superimposed left subarticular disc protrusion contacts and mildly displaces the descending left S1 nerve root (series 12, image 35). Mild to moderate facet hypertrophy. Epidural lipomatosis. No significant spinal stenosis. Foramina remain patent. IMPRESSION: 1. Technically limited exam due to habitus and motion artifact. 2. No acute abnormality within the lumbar spine. Grossly normal appearance of the conus medullaris and cauda equina. 3. Disc bulge with facet hypertrophy at L4-5 with resultant mild to moderate canal and bilateral lateral recess stenosis, with moderate right L4 foraminal narrowing. 4. Small left subarticular disc protrusion at L5-S1, contacting and mildly displacing the descending left S1 nerve root. 5. Mild diffuse edema within the lower posterior paraspinous musculature at L4 through S1, suspected to be related to adjacent facet arthritis, although muscular injury/strain could also have this appearance. Electronically Signed   By: Jeannine Boga M.D.   On: 04/13/2021 00:30    Microbiology: Recent Results (from the past 240 hour(s))  Resp Panel by RT-PCR (Flu A&B, Covid)     Status: None   Collection Time: 04/12/21  5:06 PM   Specimen: Nasopharyngeal(NP) swabs in vial transport medium  Result Value Ref Range Status   SARS Coronavirus 2  by RT PCR NEGATIVE NEGATIVE Final    Comment: (NOTE) SARS-CoV-2 target nucleic acids are NOT DETECTED.  The SARS-CoV-2 RNA is generally detectable in upper respiratory specimens during the acute phase of infection. The lowest concentration of SARS-CoV-2 viral copies this assay can  detect is 138 copies/mL. A negative result does not preclude SARS-Cov-2 infection and should not be used as the sole basis for treatment or other patient management decisions. A negative result may occur with  improper specimen collection/handling, submission of specimen other than nasopharyngeal swab, presence of viral mutation(s) within the areas targeted by this assay, and inadequate number of viral copies(<138 copies/mL). A negative result must be combined with clinical observations, patient history, and epidemiological information. The expected result is Negative.  Fact Sheet for Patients:  EntrepreneurPulse.com.au  Fact Sheet for Healthcare Providers:  IncredibleEmployment.be  This test is no t yet approved or cleared by the [place] FDA and  has been authorized for detection and/or diagnosis of SARS-CoV-2 by FDA under an Emergency Use Authorization (EUA). This EUA will remain  in effect (meaning this test can be used) for the duration of the COVID-19 declaration under Section 564(b)(1) of the Act, 21 U.S.C.section 360bbb-3(b)(1), unless the authorization is terminated  or revoked sooner.       Influenza A by PCR NEGATIVE NEGATIVE Final   Influenza B by PCR NEGATIVE NEGATIVE Final    Comment: (NOTE) The Xpert Xpress SARS-CoV-2/FLU/RSV plus assay is intended as an aid in the diagnosis of influenza from Nasopharyngeal swab specimens and should not be used as a sole basis for treatment. Nasal washings and aspirates are unacceptable for Xpert Xpress SARS-CoV-2/FLU/RSV testing.  Fact Sheet for Patients: EntrepreneurPulse.com.au  Fact Sheet for Healthcare Providers: IncredibleEmployment.be  This test is not yet approved or cleared by the [place] FDA and has been authorized for detection and/or diagnosis of SARS-CoV-2 by FDA under an Emergency Use Authorization (EUA). This EUA will remain in  effect (meaning this test can be used) for the duration of the COVID-19 declaration under Section 564(b)(1) of the Act, 21 U.S.C. section 360bbb-3(b)(1), unless the authorization is terminated or revoked.  Performed at [facility], [place] [address], [place], [place] [address]   Blood culture (routine x 2)     Status: None   Collection Time: 04/12/21  5:06 PM   Specimen: Right Antecubital; Blood  Result Value Ref Range Status   Specimen Description   Final    RIGHT ANTECUBITAL BLOOD Performed at [place] [address] [place], [place] [address]    Special Requests   Final    Blood Culture adequate volume BOTTLES DRAWN AEROBIC AND ANAEROBIC Performed at [place] [address] [place], [place] [address]    Culture   Final    NO GROWTH 5 DAYS Performed at [facility], [place] [address], [place], [place] [address]    Report Status 04/17/2021 FINAL  Final  Blood culture (routine x 2)     Status: None   Collection Time: 04/12/21  5:06 PM   Specimen: BLOOD RIGHT HAND  Result Value Ref Range Status   Specimen Description   Final    BLOOD RIGHT HAND BLOOD Performed at [place] [address] [place], [place] [address]    Special Requests   Final    Blood Culture adequate volume BOTTLES DRAWN AEROBIC AND ANAEROBIC Performed at [facility] [address], [place], [place] [address]  Culture   Final    NO GROWTH 5 DAYS Performed at [facility], [place] [address] [place], [place] [address]    Report Status 04/17/2021 FINAL  Final  Urine Culture     Status: Abnormal   Collection Time: 04/13/21 12:00 AM   Specimen: Urine, Clean Catch  Result Value Ref Range Status   Specimen Description   Final    URINE, CLEAN CATCH Performed at [facility], [place] [address], [place], [place] [address]    Special Requests   Final    NONE Performed at  [facility], [place] [address], [place], [place] [address]    Culture MULTIPLE SPECIES PRESENT, SUGGEST RECOLLECTION (A)  Final   Report Status 04/14/2021 FINAL  Final     Labs: Basic Metabolic Panel: Recent Labs  Lab 04/12/21 1706 04/12/21 1720 04/13/21 1324 04/14/21 0358 04/18/21 0330  NA 125* 127* 127* 130* 131*  K 4.6 4.6 4.6 4.1 4.3  CL 94* 94* 100 99 99  CO2 22  --  21* 21* 22  GLUCOSE 286* 281* 238* 155* 139*  BUN 18 16 14 12 7   CREATININE 1.42* 1.30* 1.11* 1.05* 1.13*  CALCIUM 8.9  --  8.3* 8.5* 8.9   Liver Function Tests: Recent Labs  Lab 04/12/21 1706  AST 22  ALT 10  ALKPHOS 72  BILITOT 0.6  PROT 8.1  ALBUMIN 3.1*   No results for input(s): LIPASE, AMYLASE in the last 168 hours. Recent Labs  Lab 04/12/21 1706  AMMONIA 20   CBC: Recent Labs  Lab 04/12/21 1706 04/12/21 1720 04/14/21 0358 04/18/21 0330  WBC 13.2*  --  8.9 6.6  NEUTROABS 9.9*  --   --   --   HGB 13.3 15.3* 11.8* 11.7*  HCT 40.8 45.0 37.2 37.1  MCV 83.6  --  85.9 84.9  PLT 282  --  231 245   Cardiac Enzymes: No results for input(s): CKTOTAL, CKMB, CKMBINDEX, TROPONINI in the last 168 hours. BNP: BNP (last 3 results) No results for input(s): BNP in the last 8760 hours.  ProBNP (last 3 results) No results for input(s): PROBNP in the last 8760 hours.  CBG: Recent Labs  Lab 04/18/21 0705 04/18/21 1133 04/18/21 1622 04/18/21 2247 04/19/21 0732  GLUCAP 134* 145* 168* 149* 210*    Principal Problem:   Hyponatremia Active Problems:   Diabetes mellitus without complication ([place])   Hypertension associated with diabetes ([place])   Obesity, morbid (HCC)   Stage 3b chronic kidney disease (CKD) (HCC)   Ovarian mass, left   Abnormal urinalysis   Bilateral leg weakness   Abnormal brain MRI   Erythema intertrigo   Hypothyroidism   Pressure injury of skin   Time coordinating discharge: 38 minutes  Signed:        Zeina Akkerman, DO [facility]  04/19/2021, 10:50 AM

## 2021-04-19 NOTE — TOC Progression Note (Signed)
Transition of Care [facility]) - Progression Note    Patient Details  Name: Makayla [name] MRN: [id number] Date of Birth: 09/11/1965  [facility] CM/SW Contact  Purcell Mouton, RN Phone Number: 04/19/2021, 2:09 PM  Clinical Narrative:     [name] was called RN and pt's sister are aware.        Expected Discharge Plan and Services           Expected Discharge Date: 04/19/21                                     Social Determinants of Health (SDOH) Interventions    Readmission Risk Interventions No flowsheet data found.

## 2021-04-19 NOTE — Plan of Care (Signed)
  Problem: Elimination: Goal: Will not experience complications related to bowel motility Outcome: Progressing   Problem: Safety: Goal: Ability to remain free from injury will improve Outcome: Progressing   Problem: Skin Integrity: Goal: Risk for impaired skin integrity will decrease Outcome: Progressing   

## 2021-05-01 ENCOUNTER — Ambulatory Visit (INDEPENDENT_AMBULATORY_CARE_PROVIDER_SITE_OTHER): Payer: Medicare Other | Admitting: Obstetrics & Gynecology

## 2021-05-01 ENCOUNTER — Other Ambulatory Visit: Payer: Self-pay

## 2021-05-01 ENCOUNTER — Encounter: Payer: Self-pay | Admitting: Obstetrics & Gynecology

## 2021-05-01 VITALS — BP 110/64 | HR 101 | Ht 64.0 in | Wt 334.0 lb

## 2021-05-01 DIAGNOSIS — N950 Postmenopausal bleeding: Secondary | ICD-10-CM | POA: Diagnosis not present

## 2021-05-01 DIAGNOSIS — N838 Other noninflammatory disorders of ovary, fallopian tube and broad ligament: Secondary | ICD-10-CM | POA: Diagnosis not present

## 2021-05-01 DIAGNOSIS — N1832 Chronic kidney disease, stage 3b: Secondary | ICD-10-CM

## 2021-05-01 DIAGNOSIS — E11649 Type 2 diabetes mellitus with hypoglycemia without coma: Secondary | ICD-10-CM

## 2021-05-01 NOTE — Progress Notes (Signed)
° ° °  Makayla [name] 11/05/1965 [id number]        55 y.o.  [id number]   RP: Large Left Ovarian Cyst on CT Scan 03/21/2021  HPI: Large Left Ovarian Cyst on CT Scan 03/21/2021.  Irregular vaginal bleeding x 2 months, currently light.  No pelvic pain.  Obesity with large panniculus. DM Type 2.  Chronic Kidney disease 3b.   OB History  Gravida Para Term Preterm AB Living  0 0 0 0 0 0  SAB IAB Ectopic Multiple Live Births  0 0 0 0 0    Past medical history,surgical history, problem list, medications, allergies, family history and social history were all reviewed and documented in the EPIC chart.   Directed ROS with pertinent positives and negatives documented in the history of present illness/assessment and plan.  Exam:  Vitals:   05/01/21 1119  BP: 110/64  Pulse: (!) 101  SpO2: 99%  Weight: (!) 334 lb (151.5 kg)  Height: 5\' 4"  (1.626 m)   General appearance:  Normal  Gynecologic exam: Deferred  CT Scan 03/21/2021:  Reproductive: Intrauterine calcifications most consistent with degenerating fibroids. There is a large left ovarian mass containing macroscopic fat and calcification, measuring 11.0 x 12.4 x 9.3 cm consistent with ovarian dermoid. No right adnexal mass. No free fluid or free gas.  No abdominal wall hernia.    Assessment/Plan:  56 y.o. G0  1. Ovarian mass, left Large Left Ovarian Cyst on CT Scan 03/21/2021. Consistent with Dermoid Cyst measured at 12.4 cm. Will complete the investigation with Tumor Markers today and f/u Pelvic US.  Will decide on referral to Gyn-[facility] per results.  Robotic approach may not be possible d/t Obesity and Medical conditions. - CA 125 - CEA - Lactate Dehydrogenase (LDH) - B-HCG Quant - US Transvaginal Non-OB; Future  2. Postmenopausal bleeding Irregular PMB x 2 months.  [place] today.  F/U Pelvic US for Endometrial line.  Evaluate Fibroids.  Will do EBx per results. - FSH - US Transvaginal Non-OB; Future  3. Obesity, morbid [facility]) May  refer to General Surgery at next visit for panniculus.  4. Type 2 diabetes mellitus with hypoglycemia without coma, unspecified whether long term insulin use (HCC)  5. Stage 3b chronic kidney disease (CKD) (HCC)   Princess Bruins MD, 11:30 AM 05/01/2021

## 2021-05-06 LAB — COMPLETE METABOLIC PANEL WITH GFR
AG Ratio: 0.7 (calc) — ABNORMAL LOW (ref 1.0–2.5)
ALT: 12 U/L (ref 6–29)
AST: 32 U/L (ref 10–35)
Albumin: 3.5 g/dL — ABNORMAL LOW (ref 3.6–5.1)
Alkaline phosphatase (APISO): 89 U/L (ref 37–153)
BUN/Creatinine Ratio: 11 (calc) (ref 6–22)
BUN: 17 mg/dL (ref 7–25)
CO2: 20 mmol/L (ref 20–32)
Calcium: 9.5 mg/dL (ref 8.6–10.4)
Chloride: 108 mmol/L (ref 98–110)
Creat: 1.54 mg/dL — ABNORMAL HIGH (ref 0.50–1.03)
Globulin: 5.2 g/dL (calc) — ABNORMAL HIGH (ref 1.9–3.7)
Glucose, Bld: 204 mg/dL — ABNORMAL HIGH (ref 65–99)
Potassium: 4.5 mmol/L (ref 3.5–5.3)
Sodium: 148 mmol/L — ABNORMAL HIGH (ref 135–146)
Total Bilirubin: 0.4 mg/dL (ref 0.2–1.2)
Total Protein: 8.7 g/dL — ABNORMAL HIGH (ref 6.1–8.1)
eGFR: 40 mL/min/{1.73_m2} — ABNORMAL LOW (ref >=60)

## 2021-05-06 LAB — LACTATE DEHYDROGENASE: LDH: 261 U/L — ABNORMAL HIGH (ref 120–250)

## 2021-05-06 LAB — FOLLICLE STIMULATING HORMONE: FSH: 2.5 m[IU]/mL

## 2021-05-06 LAB — CEA: CEA: 2.3 ng/mL

## 2021-05-06 LAB — HCG, QUANTITATIVE, PREGNANCY: HCG, Total, QN: <3 m[IU]/mL

## 2021-05-06 LAB — CA 125: CA 125: 28 U/mL (ref <35)

## 2021-05-16 ENCOUNTER — Encounter: Payer: Self-pay | Admitting: Physician Assistant

## 2021-05-16 ENCOUNTER — Other Ambulatory Visit: Payer: Self-pay

## 2021-05-16 ENCOUNTER — Ambulatory Visit (INDEPENDENT_AMBULATORY_CARE_PROVIDER_SITE_OTHER): Payer: Medicare Other | Admitting: Physician Assistant

## 2021-05-16 ENCOUNTER — Other Ambulatory Visit (INDEPENDENT_AMBULATORY_CARE_PROVIDER_SITE_OTHER): Payer: Medicare Other

## 2021-05-16 VITALS — BP 120/88 | HR 111 | Resp 18 | Ht 64.5 in

## 2021-05-16 DIAGNOSIS — R413 Other amnesia: Secondary | ICD-10-CM | POA: Diagnosis not present

## 2021-05-16 DIAGNOSIS — F0150 Vascular dementia without behavioral disturbance: Secondary | ICD-10-CM

## 2021-05-16 LAB — TSH: TSH: 0.78 u[IU]/mL (ref 0.35–5.50)

## 2021-05-16 NOTE — Progress Notes (Addendum)
Assessment/Plan:   Makayla [name] is a very pleasant 56 y.o. year old RH female with  a history of hypertension, hyperlipidemia, left ovarian mass, diabetes type 2, hypothyroidism, vitamin D deficiency, gout, super morbid obesity with BMI 68.66, history of spinal stenosis, recent hospitalization for cellulitis of abdominal wall (December 2022) anxiety, depression seen today for evaluation of memory loss. MoCA today is 10/20 with deficiencies in most areas.  MRI of the brain without contrast on 04/12/21 was remarkable for advanced cerebral atrophy for age, with fairly pronounced atrophy/encephalomalacia involving the anterior temporal poles bilaterally that could possibly related to prior trauma or ischemia versus changes of CADASIL.  Although patient reports that her memory issues were present for about 1 month, findings are more concerning that this changes have been present for a longer period of time.   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  Discussed the importance of regular daily schedule with inclusion of crossword puzzles to maintain brain function.  Continue to monitor mood with PCP.  Stay active at least 30 minutes at least 3 times a week.  Naps should be scheduled and should be no longer than 60 minutes and should not occur after 2 PM.  Control cardiovascular risk factors  Mediterranean diet is recommended  Folllow up in 1 month, at which time further testing will be considered pending on the results.  Subjective:    The patient is seen in neurologic consultation at the request of Arthur Holms, NP for the evaluation of memory.  The patient is accompanied by her sister who supplements the  history. The patient had recently been admitted for increased deconditioning, hyponatremia, and recent cellulitis requiring IV antibiotics.she was initially sent to SNF for rehab prior to returning home and now she is receiving home nurse visits once a week upon discharge.   She states that her memory changes began about 1 month ago, when she was on the phone with her sister, and she was forgetting what the conversation was about.  Her sister states that she has always been "slow in school although she finished high school ". However, her short-term memory seems to be more evident by her mother, who lives with her, and sister.  She denies repeating herself, but she has to ask more than once a question to understand.  She has been more disoriented when walking into her room.  She denies leaving objects in unusual places.  The patient is essentially wheelchair-bound unable to walk distances.  She denies any head injuries in the past, but did fall in the past, without hitting her head, dizziness or loss of consciousness.  She has not been ambulating much, uses the wheelchair frequently in view of chronic back pain and large pannus extending to her knees, having fallen requiring hospitalization in July 2022, she does not drive.  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  No history of depression, denies mood changes.  No irritability or other personality changes.  She does not sleep well, wakes up in the middle of the night, but denies vivid dreams or sleepwalking, hallucinations or paranoia.  Her sister denies any hygiene concerns.  She does require assistance however for bathing and dressing.  Her sister assists her with the medications, placing them in a pillbox.  She does her own finances, but she needs help monitoring it by her sister.  Her appetite has been decreased over the last 3 weeks, she denies trouble swallowing.  Occasionally she cooks, denies leaving the stove or  the faucet on.  She has intermittent headaches when "aggravated or nervous ".  She denies double vision, dizziness, focal numbness, tingling, unilateral weakness, tremors, or anosmia.  Never had COVID.  No history of seizures.  Denies urine incontinence, she has issues with intermittent retention, denies constipation or diarrhea.  Denies sleep apnea.  Denies alcohol, she smokes 1 pack a week of cigarettes.  Family history maternal aunt with Alzheimer's disease.     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 Finding is nonspecific, and could possibly related to prior trauma or ischemia. Changes of CADASIL could also be considered.    Allergies  Allergen Reactions   Penicillins Shortness Of Breath    Inflates bronchitis    Ibuprofen Other (See Comments)    Can not take due to kidney problems.    Peach Flavor Swelling    Peaches.    Strawberry Extract Swelling    Current Outpatient Medications  Medication Instructions   acetaminophen (TYLENOL) 650 mg, Oral, Every 6 hours PRN   albuterol (PROVENTIL HFA;VENTOLIN HFA) 108 (90 BASE) MCG/ACT inhaler 2 puffs, Inhalation, Every 6 hours PRN, For shortness of breath    allopurinol (ZYLOPRIM) 100 mg, Oral, Daily   amLODipine (NORVASC) 5 MG tablet Take 1 tablet by mouth daily.   B-D UF III MINI PEN NEEDLES 31G X 5 MM MISC See admin instructions   cyclobenzaprine (FLEXERIL) 5 MG tablet Take 1 tablet twice a day as needed for Muscle spasms.   furosemide (LASIX) 20 mg, Oral, Daily   glipiZIDE (GLUCOTROL) 5 mg, Oral, 2 times daily before meals   glucose blood (ONETOUCH VERIO) test strip Use as instructed to check blood sugar 2 times daily Dx code E11.9   Insulin Syringe-Needle U-100 (SAFETY-GLIDE 0.3CC SYR 29GX1/2) 29G X 1/2" 0.3 ML MISC Use as directed.   levothyroxine (SYNTHROID)  50 mcg, Oral, Daily before breakfast   Multiple Vitamin (MULTIVITAMIN WITH MINERALS) TABS tablet 1 tablet, Oral, Daily   omeprazole (PRILOSEC) 20 mg, Oral, Daily before breakfast   rosuvastatin (CRESTOR) 10 mg, Oral, Daily at bedtime   SYMBICORT 160-4.5 MCG/ACT inhaler 2 puffs, Inhalation, 2 times daily   Tresiba FlexTouch 56 Units, Subcutaneous, 2 times daily   Victoza 0.6 mg, Subcutaneous, 2 times daily     VITALS:   Vitals:   05/16/21 0931  BP: 120/88  Pulse: (!) 111  Resp: 18  SpO2: 97%  Height: 5' 4.5" (1.638 m)     PHYSICAL EXAM   HEENT:  Normocephalic, atraumatic. The mucous membranes are moist. The superficial temporal arteries are without ropiness or tenderness. Cardiovascular: Regular rate and rhythm. Lungs: Clear to auscultation bilaterally. Neck: There are no carotid bruits noted bilaterally.  NEUROLOGICAL: Montreal Cognitive Assessment  05/16/2021  Visuospatial/ Executive (0/5) 1  Naming (0/3) 1  Attention: Read list of digits (0/2) 2  Attention: Read list of letters (0/1) 1  Attention: Serial 7 subtraction starting at 100 (0/3) 0  Language: Repeat phrase (0/2) 1  Language : Fluency (0/1) 0  Abstraction (0/2) 0  Delayed Recall (0/5) 0  Orientation (0/6) 4  Total 10  Adjusted Score (based on education) 11   No flowsheet data found.  No flowsheet data found.   Orientation:  Alert and oriented to person,not to place and time, year is 1921. No aphasia or dysarthria. Fund of knowledge is reduced. Recent and remote memory are impaired  Attention and concentration are reduced.  Able to name objects and repeat phrases 1/3 . Delayed recall  0/5.  She was unable to draw a clock, or to follow instructions in coping cube, or associating letters with numbers. Cranial nerves: There is good facial symmetry.  Nystagmus is noted bilaterally.  Extraocular muscles are intact and visual fields are full to confrontational testing. Speech is fluent and clear. Soft palate rises  symmetrically and there is no tongue deviation.  She is edentulous.  Hearing is intact to conversational tone. Tone: Tone is good throughout. Sensation: Sensation is intact to light touch and pinprick throughout. Vibration is intact at the bilateral big toe.There is no extinction with double simultaneous stimulation. There is no sensory dermatomal level identified. Coordination: The patient has no difficulty with RAM's or FNF bilaterally. Normal finger to nose  Motor: Strength is 5/5 in the bilateral upper and lower extremities. There is no pronator drift. There are no fasciculations noted. DTR's: Deep tendon reflexes are 1/4 at the bilateral biceps, triceps, brachioradialis, patella and achilles.  Plantar responses are downgoing bilaterally. Gait and Station: The patient is able unable to ambulate due to body habitus, she is in wheelchair, unable to test heel toe walk, or Romberg.    Thank you for allowing [place] the opportunity to participate in the care of this nice patient. Please do not hesitate to contact us for any questions or concerns.   Total time spent on today's visit was 45 minutes, including both face-to-face time and nonface-to-face time.  Time included that spent on review of records (prior notes available to me/labs/imaging if pertinent), discussing treatment and goals, answering patient's questions and coordinating care.  Cc:  Arthur Holms, NP  Sharene Butters 05/17/2021 7:20 AM

## 2021-05-16 NOTE — Patient Instructions (Addendum)
It was a pleasure to see you today at our office.   Recommendations:  MRI of the brain, the radiology office will call you to arrange you appointment Check labs today Follow up after neurocognitive testing  RECOMMENDATIONS FOR ALL PATIENTS WITH MEMORY PROBLEMS: 1. Continue to exercise (Recommend 30 minutes of walking everyday, or 3 hours every week) 2. Increase social interactions - continue going to [place] and enjoy social gatherings with friends and family 3. Eat healthy, avoid fried foods and eat more fruits and vegetables 4. Maintain adequate blood pressure, blood sugar, and blood cholesterol level. Reducing the risk of stroke and cardiovascular disease also helps promoting better memory. 5. Avoid stressful situations. Live a simple life and avoid aggravations. Organize your time and prepare for the next day in anticipation. 6. Sleep well, avoid any interruptions of sleep and avoid any distractions in the bedroom that may interfere with adequate sleep quality 7. Avoid sugar, avoid sweets as there is a strong link between excessive sugar intake, diabetes, and cognitive impairment We discussed the Mediterranean diet, which has been shown to help patients reduce the risk of progressive memory disorders and reduces cardiovascular risk. This includes eating fish, eat fruits and green leafy vegetables, nuts like almonds and hazelnuts, walnuts, and also use olive oil. Avoid fast foods and fried foods as much as possible. Avoid sweets and sugar as sugar use has been linked to worsening of memory function.  There is always a concern of gradual progression of memory problems. If this is the case, then we may need to adjust level of care according to patient needs. Support, both to the patient and caregiver, should then be put into place.      You have been referred for a neuropsychological evaluation (i.e., evaluation of memory and thinking abilities). Please bring someone with you to this  appointment if possible, as it is helpful for the doctor to hear from both you and another adult who knows you well. Please bring eyeglasses and hearing aids if you wear them.    The evaluation will take approximately 3 hours and has two parts:   The first part is a clinical interview with the neuropsychologist (Dr. Melvyn Novas or Dr. Nicole Kindred). During the interview, the neuropsychologist will speak with you and the individual you brought to the appointment.    The second part of the evaluation is testing with the doctor's technician Hinton Dyer or Maudie Mercury). During the testing, the technician will ask you to remember different types of material, solve problems, and answer some questionnaires. Your family member will not be present for this portion of the evaluation.   Please note: We must reserve several hours of the neuropsychologist's time and the psychometrician's time for your evaluation appointment. As such, there is a No-Show fee of $100. If you are unable to attend any of your appointments, please contact our office as soon as possible to reschedule.    FALL PRECAUTIONS: Be cautious when walking. Scan the area for obstacles that may increase the risk of trips and falls. When getting up in the mornings, sit up at the edge of the bed for a few minutes before getting out of bed. Consider elevating the bed at the head end to avoid drop of blood pressure when getting up. Walk always in a well-lit room (use night lights in the walls). Avoid area rugs or power cords from appliances in the middle of the walkways. Use a walker or a cane if necessary and consider physical therapy for balance  exercise. Get your eyesight checked regularly.  FINANCIAL OVERSIGHT: Supervision, especially oversight when making financial decisions or transactions is also recommended.  HOME SAFETY: Consider the safety of the kitchen when operating appliances like stoves, microwave oven, and blender. Consider having supervision and share cooking  responsibilities until no longer able to participate in those. Accidents with firearms and other hazards in the house should be identified and addressed as well.   ABILITY TO BE LEFT ALONE: If patient is unable to contact 911 operator, consider using LifeLine, or when the need is there, arrange for someone to stay with patients. Smoking is a fire hazard, consider supervision or cessation. Risk of wandering should be assessed by caregiver and if detected at any point, supervision and safe proof recommendations should be instituted.  MEDICATION SUPERVISION: Inability to self-administer medication needs to be constantly addressed. Implement a mechanism to ensure safe administration of the medications.   DRIVING: Regarding driving, in patients with progressive memory problems, driving will be impaired. We advise to have someone else do the driving if trouble finding directions or if minor accidents are reported. Independent driving assessment is available to determine safety of driving.   If you are interested in the driving assessment, you can contact the following:  [employer] in [place]  [place] [phone] or [phone]    [place] refers to food and lifestyle choices that are based on the traditions of countries located on the [employer]. This way of eating has been shown to help prevent certain conditions and improve outcomes for people who have chronic diseases, like kidney disease and heart disease. What are tips for following this plan? Lifestyle  Cook and eat meals together with your family, when possible. Drink enough fluid to keep your urine clear or pale yellow. Be physically active every day. This includes: Aerobic exercise like running or swimming. Leisure activities like gardening, walking, or housework. Get 7-8  hours of sleep each night. If recommended by your health care provider, drink red wine in moderation. This means 1 glass a day for nonpregnant women and 2 glasses a day for men. A glass of wine equals 5 oz (150 mL). Reading food labels  Check the serving size of packaged foods. For foods such as rice and pasta, the serving size refers to the amount of cooked product, not dry. Check the total fat in packaged foods. Avoid foods that have saturated fat or trans fats. Check the ingredients list for added sugars, such as corn syrup. Shopping  At the grocery store, buy most of your food from the areas near the walls of the store. This includes: Fresh fruits and vegetables (produce). Grains, beans, nuts, and seeds. Some of these may be available in unpackaged forms or large amounts (in bulk). Fresh seafood. Poultry and eggs. Low-fat dairy products. Buy whole ingredients instead of prepackaged foods. Buy fresh fruits and vegetables in-season from local farmers markets. Buy frozen fruits and vegetables in resealable bags. If you do not have access to quality fresh seafood, buy precooked frozen shrimp or canned fish, such as tuna, salmon, or sardines. Buy small amounts of raw or cooked vegetables, salads, or olives from the deli or salad bar at your store. Stock your pantry so you always have certain foods on hand, such as olive oil, canned tuna, canned tomatoes, rice, pasta, and beans. Cooking  Cook foods with extra-virgin olive oil instead of using  butter or other vegetable oils. Have meat as a side dish, and have vegetables or grains as your main dish. This means having meat in small portions or adding small amounts of meat to foods like pasta or stew. Use beans or vegetables instead of meat in common dishes like chili or lasagna. Experiment with different cooking methods. Try roasting or broiling vegetables instead of steaming or sauteing them. Add frozen vegetables to soups, stews, pasta, or  rice. Add nuts or seeds for added healthy fat at each meal. You can add these to yogurt, salads, or vegetable dishes. Marinate fish or vegetables using olive oil, lemon juice, garlic, and fresh herbs. Meal planning  Plan to eat 1 vegetarian meal one day each week. Try to work up to 2 vegetarian meals, if possible. Eat seafood 2 or more times a week. Have healthy snacks readily available, such as: Vegetable sticks with hummus. Greek yogurt. Fruit and nut trail mix. Eat balanced meals throughout the week. This includes: Fruit: 2-3 servings a day Vegetables: 4-5 servings a day Low-fat dairy: 2 servings a day Fish, poultry, or lean meat: 1 serving a day Beans and legumes: 2 or more servings a week Nuts and seeds: 1-2 servings a day Whole grains: 6-8 servings a day Extra-virgin olive oil: 3-4 servings a day Limit red meat and sweets to only a few servings a month What are my food choices? Mediterranean diet Recommended Grains: Whole-grain pasta. Brown rice. Bulgar wheat. Polenta. Couscous. Whole-wheat bread. [place]. Vegetables: Artichokes. Beets. Broccoli. Cabbage. Carrots. Eggplant. Green beans. Chard. Kale. Spinach. Onions. Leeks. Peas. Squash. Tomatoes. Peppers. Radishes. Fruits: Apples. Apricots. Avocado. Berries. Bananas. Cherries. Dates. Figs. Grapes. Lemons. Melon. Oranges. Peaches. Plums. Pomegranate. Meats and other protein foods: Beans. Almonds. Sunflower seeds. Pine nuts. Peanuts. [place]. Salmon. Scallops. Shrimp. [place]. Tilapia. Clams. Oysters. Eggs. Dairy: Low-fat milk. Cheese. Greek yogurt. Beverages: Water. Red wine. Herbal tea. Fats and oils: Extra virgin olive oil. Avocado oil. Grape seed oil. Sweets and desserts: [place] yogurt with honey. Baked apples. Poached pears. Trail mix. Seasoning and other foods: Basil. Cilantro. Coriander. Cumin. Mint. Parsley. Sage. Rosemary. Tarragon. Garlic. Oregano. Thyme. Pepper. Balsalmic vinegar. Tahini. Hummus. Tomato sauce. Olives.  Mushrooms. Limit these Grains: Prepackaged pasta or rice dishes. Prepackaged cereal with added sugar. Vegetables: Deep fried potatoes (french fries). Fruits: Fruit canned in syrup. Meats and other protein foods: Beef. Pork. Lamb. Poultry with skin. Hot dogs. [place] Salines. Dairy: Ice cream. Sour cream. Whole milk. Beverages: Juice. Sugar-sweetened soft drinks. Beer. Liquor and spirits. Fats and oils: Butter. Canola oil. Vegetable oil. Beef fat (tallow). Lard. Sweets and desserts: Cookies. Cakes. Pies. Candy. Seasoning and other foods: Mayonnaise. Premade sauces and marinades. The items listed may not be a complete list. Talk with your dietitian about what dietary choices are right for you. Summary The Mediterranean diet includes both food and lifestyle choices. Eat a variety of fresh fruits and vegetables, beans, nuts, seeds, and whole grains. Limit the amount of red meat and sweets that you eat. Talk with your health care provider about whether it is safe for you to drink red wine in moderation. This means 1 glass a day for nonpregnant women and 2 glasses a day for men. A glass of wine equals 5 oz (150 mL). This information is not intended to replace advice given to you by your health care provider. Make sure you discuss any questions you have with your health care provider. Document Released: 11/17/2015 Document Revised: 12/20/2015 Document Reviewed: 11/17/2015 Elsevier Interactive Patient Education  2017 [place] provider has requested that you have labwork completed today. Please go to [facility] ([id number]) on the second floor of this building before leaving the office today. You do not need to check in. If you are not called within 15 minutes please check with the front desk.

## 2021-05-17 ENCOUNTER — Encounter (HOSPITAL_COMMUNITY): Payer: Self-pay

## 2021-05-17 ENCOUNTER — Emergency Department (HOSPITAL_COMMUNITY): Payer: Medicare Other

## 2021-05-17 ENCOUNTER — Observation Stay (HOSPITAL_COMMUNITY): Payer: Medicare Other

## 2021-05-17 ENCOUNTER — Inpatient Hospital Stay (HOSPITAL_COMMUNITY)
Admission: EM | Admit: 2021-05-17 | Discharge: 2021-05-25 | DRG: 871 | Disposition: A | Discharge status: Home / Self Care | Payer: Medicare Other | Attending: Internal Medicine | Admitting: Internal Medicine

## 2021-05-17 DIAGNOSIS — A419 Sepsis, unspecified organism: Secondary | ICD-10-CM | POA: Diagnosis not present

## 2021-05-17 DIAGNOSIS — E1140 Type 2 diabetes mellitus with diabetic neuropathy, unspecified: Secondary | ICD-10-CM | POA: Diagnosis present

## 2021-05-17 DIAGNOSIS — Z8249 Family history of ischemic heart disease and other diseases of the circulatory system: Secondary | ICD-10-CM

## 2021-05-17 DIAGNOSIS — Z66 Do not resuscitate: Secondary | ICD-10-CM | POA: Diagnosis present

## 2021-05-17 DIAGNOSIS — K8020 Calculus of gallbladder without cholecystitis without obstruction: Secondary | ICD-10-CM | POA: Diagnosis present

## 2021-05-17 DIAGNOSIS — R6520 Severe sepsis without septic shock: Secondary | ICD-10-CM | POA: Diagnosis present

## 2021-05-17 DIAGNOSIS — Z87891 Personal history of nicotine dependence: Secondary | ICD-10-CM

## 2021-05-17 DIAGNOSIS — E785 Hyperlipidemia, unspecified: Secondary | ICD-10-CM | POA: Diagnosis present

## 2021-05-17 DIAGNOSIS — K760 Fatty (change of) liver, not elsewhere classified: Secondary | ICD-10-CM | POA: Diagnosis present

## 2021-05-17 DIAGNOSIS — U071 COVID-19: Secondary | ICD-10-CM | POA: Diagnosis not present

## 2021-05-17 DIAGNOSIS — J9621 Acute and chronic respiratory failure with hypoxia: Secondary | ICD-10-CM | POA: Diagnosis present

## 2021-05-17 DIAGNOSIS — E1159 Type 2 diabetes mellitus with other circulatory complications: Secondary | ICD-10-CM | POA: Diagnosis present

## 2021-05-17 DIAGNOSIS — R6510 Systemic inflammatory response syndrome (SIRS) of non-infectious origin without acute organ dysfunction: Secondary | ICD-10-CM | POA: Diagnosis present

## 2021-05-17 DIAGNOSIS — E1165 Type 2 diabetes mellitus with hyperglycemia: Secondary | ICD-10-CM | POA: Diagnosis not present

## 2021-05-17 DIAGNOSIS — E871 Hypo-osmolality and hyponatremia: Secondary | ICD-10-CM | POA: Diagnosis present

## 2021-05-17 DIAGNOSIS — A4189 Other specified sepsis: Principal | ICD-10-CM | POA: Diagnosis present

## 2021-05-17 DIAGNOSIS — N179 Acute kidney failure, unspecified: Secondary | ICD-10-CM | POA: Diagnosis not present

## 2021-05-17 DIAGNOSIS — I152 Hypertension secondary to endocrine disorders: Secondary | ICD-10-CM | POA: Diagnosis present

## 2021-05-17 DIAGNOSIS — E1169 Type 2 diabetes mellitus with other specified complication: Secondary | ICD-10-CM | POA: Diagnosis present

## 2021-05-17 DIAGNOSIS — E878 Other disorders of electrolyte and fluid balance, not elsewhere classified: Secondary | ICD-10-CM | POA: Diagnosis present

## 2021-05-17 DIAGNOSIS — B966 Bacteroides fragilis [B. fragilis] as the cause of diseases classified elsewhere: Secondary | ICD-10-CM | POA: Diagnosis present

## 2021-05-17 DIAGNOSIS — L89102 Pressure ulcer of unspecified part of back, stage 2: Secondary | ICD-10-CM | POA: Diagnosis present

## 2021-05-17 DIAGNOSIS — R7881 Bacteremia: Secondary | ICD-10-CM

## 2021-05-17 DIAGNOSIS — Z7984 Long term (current) use of oral hypoglycemic drugs: Secondary | ICD-10-CM

## 2021-05-17 DIAGNOSIS — R109 Unspecified abdominal pain: Secondary | ICD-10-CM | POA: Diagnosis not present

## 2021-05-17 DIAGNOSIS — E039 Hypothyroidism, unspecified: Secondary | ICD-10-CM | POA: Diagnosis present

## 2021-05-17 DIAGNOSIS — Z6841 Body Mass Index (BMI) 40.0 and over, adult: Secondary | ICD-10-CM

## 2021-05-17 DIAGNOSIS — Z833 Family history of diabetes mellitus: Secondary | ICD-10-CM

## 2021-05-17 DIAGNOSIS — K8050 Calculus of bile duct without cholangitis or cholecystitis without obstruction: Secondary | ICD-10-CM

## 2021-05-17 DIAGNOSIS — Z794 Long term (current) use of insulin: Secondary | ICD-10-CM

## 2021-05-17 DIAGNOSIS — T380X5A Adverse effect of glucocorticoids and synthetic analogues, initial encounter: Secondary | ICD-10-CM | POA: Diagnosis not present

## 2021-05-17 DIAGNOSIS — N1832 Chronic kidney disease, stage 3b: Secondary | ICD-10-CM

## 2021-05-17 DIAGNOSIS — E1122 Type 2 diabetes mellitus with diabetic chronic kidney disease: Secondary | ICD-10-CM | POA: Diagnosis present

## 2021-05-17 DIAGNOSIS — E860 Dehydration: Secondary | ICD-10-CM | POA: Diagnosis present

## 2021-05-17 DIAGNOSIS — J42 Unspecified chronic bronchitis: Secondary | ICD-10-CM

## 2021-05-17 DIAGNOSIS — M546 Pain in thoracic spine: Secondary | ICD-10-CM | POA: Diagnosis not present

## 2021-05-17 DIAGNOSIS — Z83438 Family history of other disorder of lipoprotein metabolism and other lipidemia: Secondary | ICD-10-CM

## 2021-05-17 DIAGNOSIS — Z79899 Other long term (current) drug therapy: Secondary | ICD-10-CM

## 2021-05-17 DIAGNOSIS — E11649 Type 2 diabetes mellitus with hypoglycemia without coma: Secondary | ICD-10-CM | POA: Diagnosis present

## 2021-05-17 DIAGNOSIS — R7989 Other specified abnormal findings of blood chemistry: Secondary | ICD-10-CM | POA: Diagnosis present

## 2021-05-17 LAB — COMPREHENSIVE METABOLIC PANEL
ALT: 26 U/L (ref 0–44)
AST: 97 U/L — ABNORMAL HIGH (ref 15–41)
Albumin: 2.5 g/dL — ABNORMAL LOW (ref 3.5–5.0)
Alkaline Phosphatase: 118 U/L (ref 38–126)
Anion gap: 12 (ref 5–15)
BUN: 20 mg/dL (ref 6–20)
CO2: 19 mmol/L — ABNORMAL LOW (ref 22–32)
Calcium: 8.5 mg/dL — ABNORMAL LOW (ref 8.9–10.3)
Chloride: 101 mmol/L (ref 98–111)
Creatinine, Ser: 1.96 mg/dL — ABNORMAL HIGH (ref 0.44–1.00)
GFR, Estimated: 30 mL/min — ABNORMAL LOW (ref >60)
Glucose, Bld: 73 mg/dL (ref 70–99)
Potassium: 4.3 mmol/L (ref 3.5–5.1)
Sodium: 132 mmol/L — ABNORMAL LOW (ref 135–145)
Total Bilirubin: 0.7 mg/dL (ref 0.3–1.2)
Total Protein: 7.6 g/dL (ref 6.5–8.1)

## 2021-05-17 LAB — CBC WITH DIFFERENTIAL/PLATELET
Abs Immature Granulocytes: 0.22 10*3/uL — ABNORMAL HIGH (ref 0.00–0.07)
Basophils Absolute: 0 10*3/uL (ref 0.0–0.1)
Basophils Relative: 0 %
Eosinophils Absolute: 0 10*3/uL (ref 0.0–0.5)
Eosinophils Relative: 0 %
HCT: 36.9 % (ref 36.0–46.0)
Hemoglobin: 11.7 g/dL — ABNORMAL LOW (ref 12.0–15.0)
Immature Granulocytes: 1 %
Lymphocytes Relative: 11 %
Lymphs Abs: 1.8 10*3/uL (ref 0.7–4.0)
MCH: 26.5 pg (ref 26.0–34.0)
MCHC: 31.7 g/dL (ref 30.0–36.0)
MCV: 83.7 fL (ref 80.0–100.0)
Monocytes Absolute: 1.2 10*3/uL — ABNORMAL HIGH (ref 0.1–1.0)
Monocytes Relative: 7 %
Neutro Abs: 13 10*3/uL — ABNORMAL HIGH (ref 1.7–7.7)
Neutrophils Relative %: 81 %
Platelets: 232 10*3/uL (ref 150–400)
RBC: 4.41 MIL/uL (ref 3.87–5.11)
RDW: 17.3 % — ABNORMAL HIGH (ref 11.5–15.5)
WBC: 16.2 10*3/uL — ABNORMAL HIGH (ref 4.0–10.5)
nRBC: 0 % (ref 0.0–0.2)

## 2021-05-17 LAB — URINALYSIS, ROUTINE W REFLEX MICROSCOPIC
Bacteria, UA: NONE SEEN
Bilirubin Urine: NEGATIVE
Glucose, UA: NEGATIVE mg/dL
Hgb urine dipstick: NEGATIVE
Ketones, ur: NEGATIVE mg/dL
Leukocytes,Ua: NEGATIVE
Nitrite: NEGATIVE
Protein, ur: 100 mg/dL — AB
Specific Gravity, Urine: 1.024 (ref 1.005–1.030)
pH: 5 (ref 5.0–8.0)

## 2021-05-17 LAB — LIPASE, BLOOD: Lipase: 24 U/L (ref 11–51)

## 2021-05-17 LAB — RESP PANEL BY RT-PCR (FLU A&B, COVID) ARPGX2
Influenza A by PCR: NEGATIVE
Influenza B by PCR: NEGATIVE
SARS Coronavirus 2 by RT PCR: POSITIVE — AB

## 2021-05-17 LAB — LACTIC ACID, PLASMA
Lactic Acid, Venous: 3.9 mmol/L (ref 0.5–1.9)
Lactic Acid, Venous: 3.9 mmol/L (ref 0.5–1.9)

## 2021-05-17 LAB — I-STAT BETA HCG BLOOD, ED (MC, WL, AP ONLY): I-stat hCG, quantitative: <5 m[IU]/mL (ref <5)

## 2021-05-17 LAB — CBG MONITORING, ED
Glucose-Capillary: 52 mg/dL — ABNORMAL LOW (ref 70–99)
Glucose-Capillary: 87 mg/dL (ref 70–99)

## 2021-05-17 IMAGING — US US ABDOMEN COMPLETE
1 series · 15 of 25 positions shown · non-contrast
Comparison: CT [DATE]

CLINICAL DATA: Right upper quadrant pain

EXAM:
ABDOMEN ULTRASOUND COMPLETE

[Series 1: us abdomen complete mc & wl · 15 of 82 slices shown]
[im 1/82]
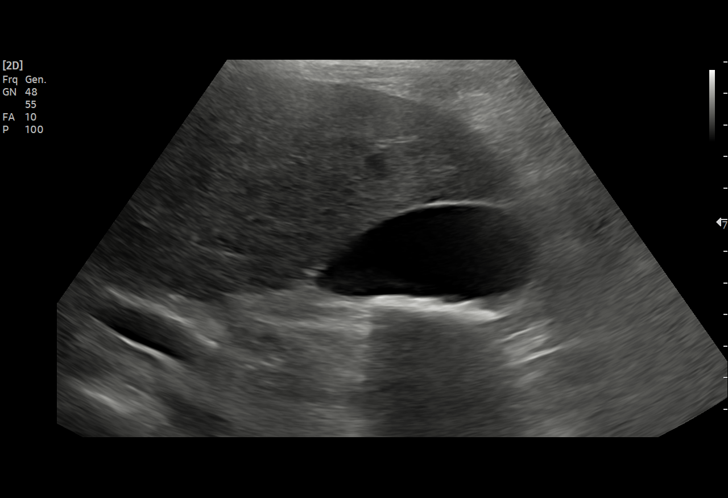
[im 7/82]
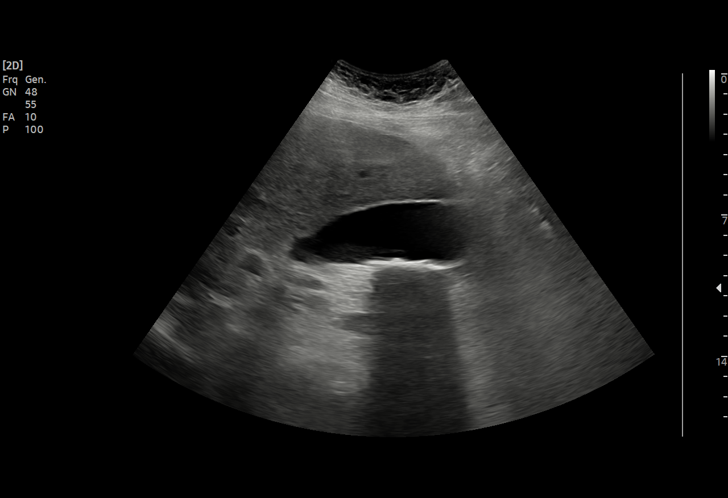
[im 14/82]
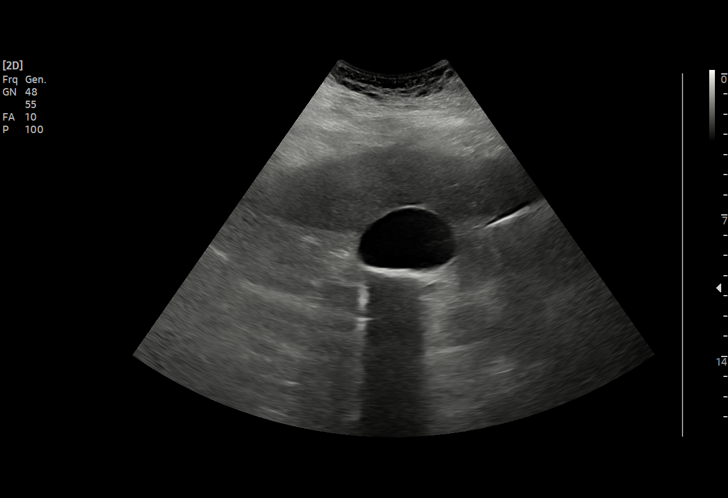
[im 17/82]
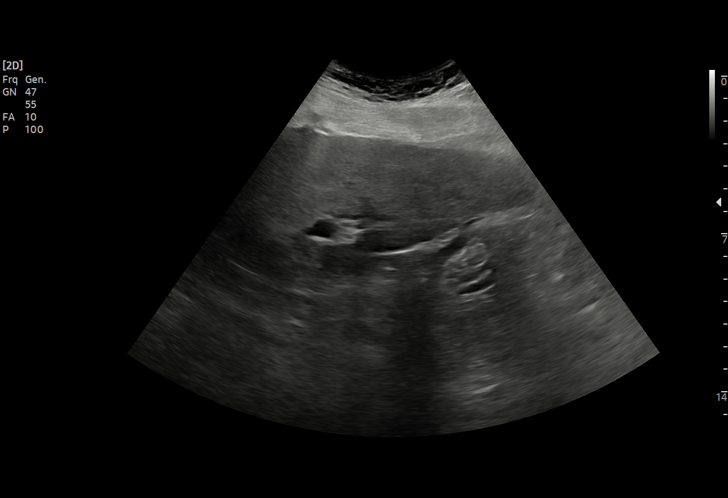
[im 24/82]
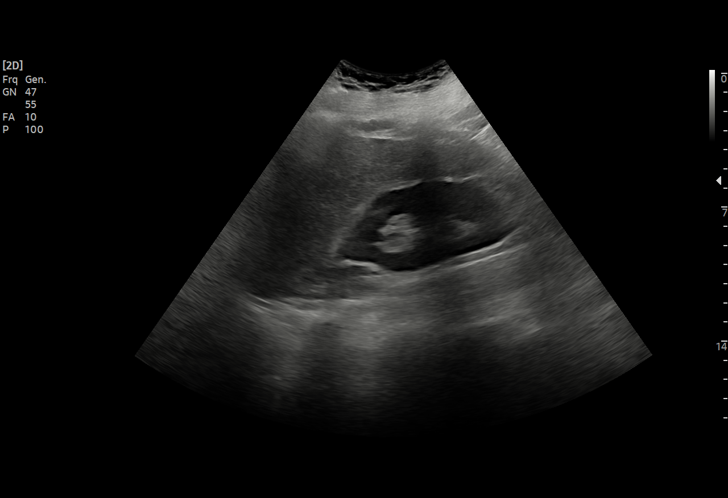
[im 31/82]
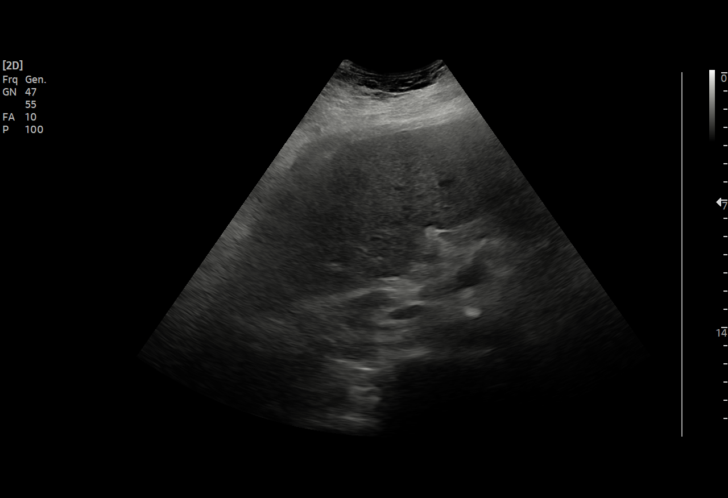
[im 34/82]
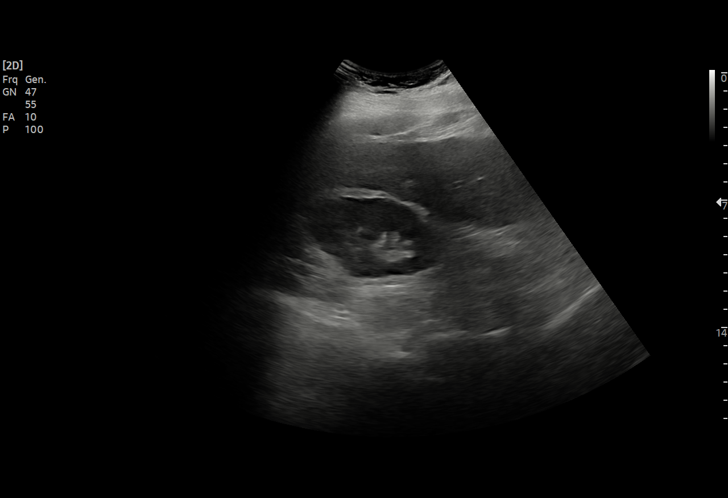
[im 41/82]
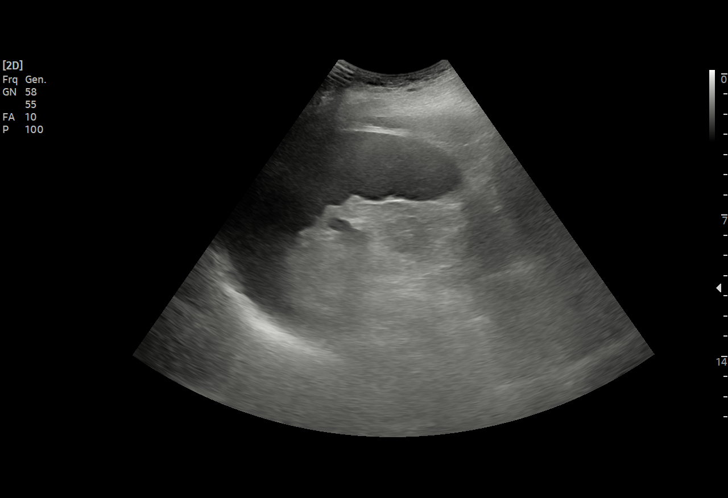
[im 48/82]
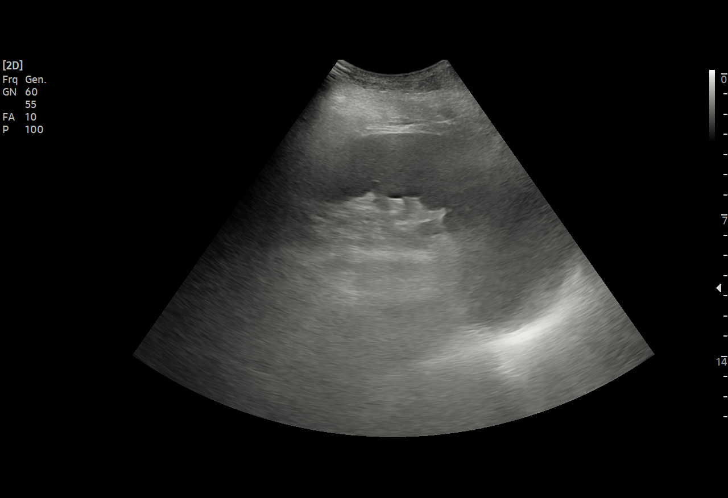
[im 51/82]
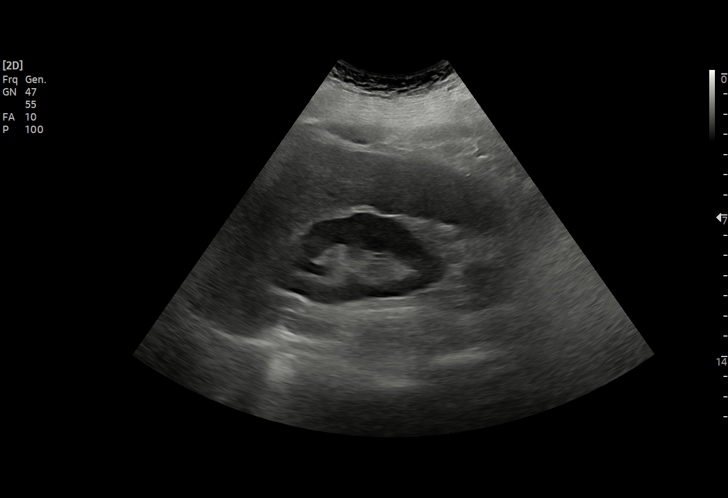
[im 58/82]
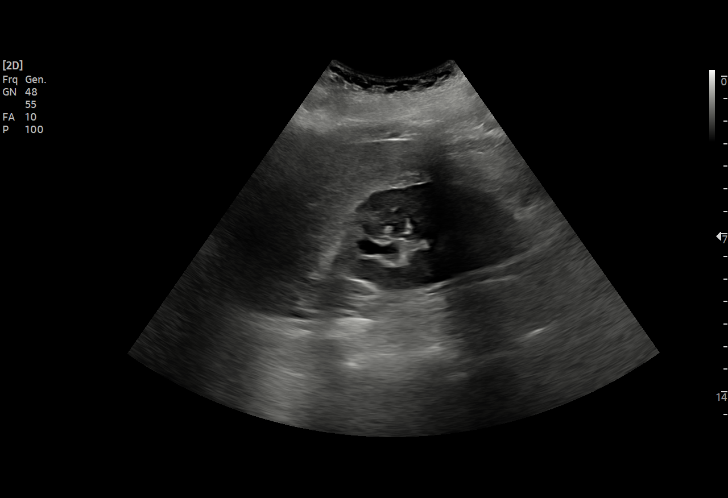
[im 65/82]
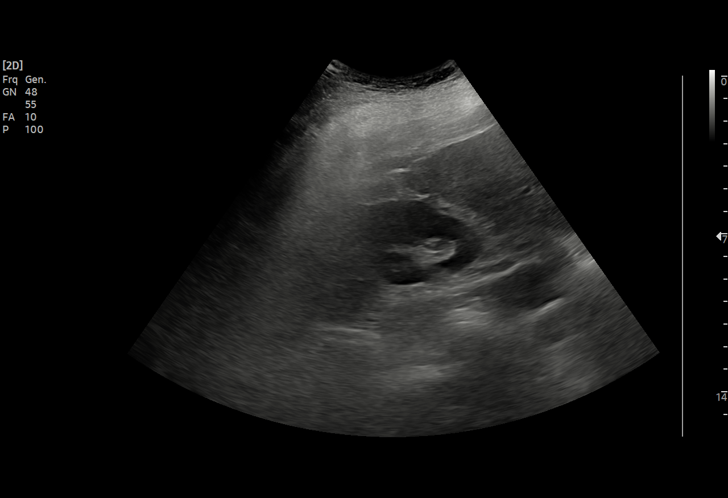
[im 68/82]
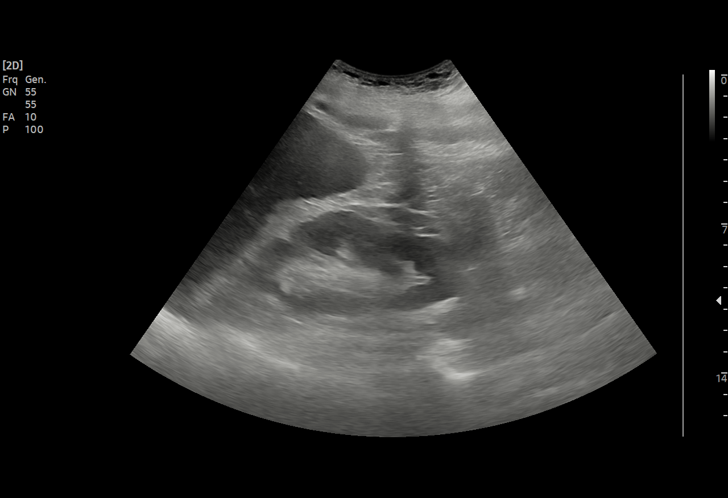
[im 75/82]
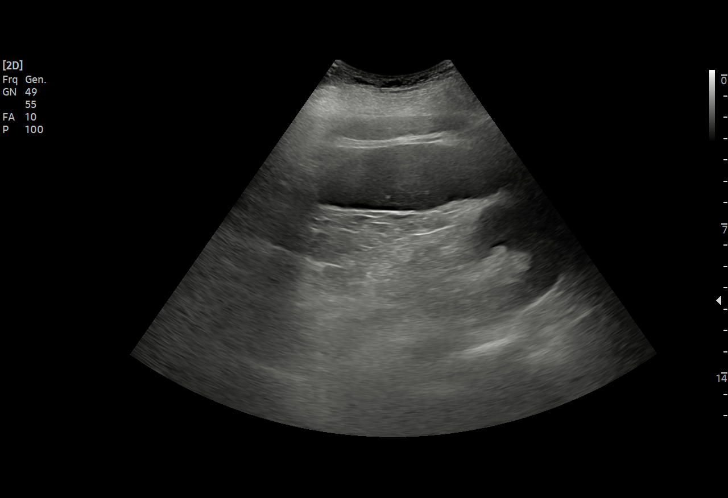
[im 82/82]
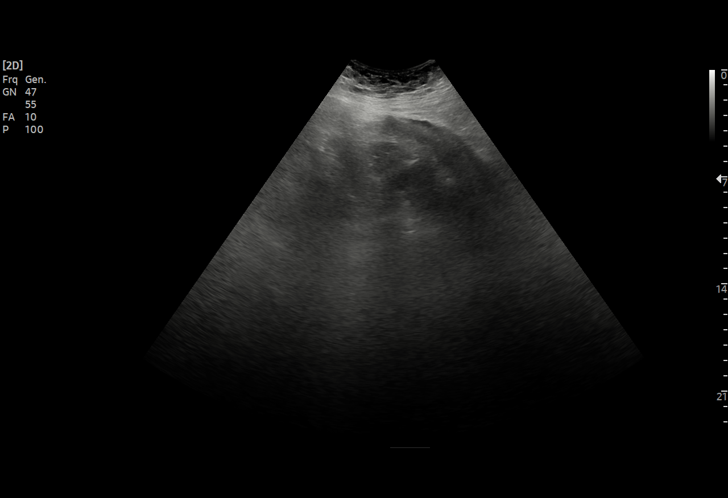

[15 of 25 positions shown; findings below may reference images not displayed]

FINDINGS: Gallbladder: Multiple small shadowing stones and gallbladder sludge.
Normal wall thickness. Negative sonographic Murphy.

Common bile duct: Diameter: 6 mm

Liver: Diffusely echogenic. No focal hepatic abnormality. Portal
vein is patent on color Doppler imaging with normal direction of
blood flow towards the liver.

IVC: No abnormality visualized.

Pancreas: Visualized portion unremarkable.

Spleen: Size and appearance within normal limits.

Right Kidney: Length: 9.8 cm. Echogenicity within normal limits.
Mild upper pole hydronephrosis but no pelvic dilatation.

Left Kidney: Length: 9.8 cm. Echogenicity within normal limits. No
mass or hydronephrosis visualized.

Abdominal aorta: No aneurysm visualized.

Other findings: None.
IMPRESSION: 1. Cholelithiasis without sonographic evidence for acute
cholecystitis.
2. Echogenic liver consistent with hepatic steatosis
3. Mild upper pole caliectasis on the right without renal pelvis
dilatation.

## 2021-05-17 IMAGING — DX DG CHEST 1V PORT
1 series · 1 of 1 positions shown · non-contrast
Comparison: [DATE]

CLINICAL DATA: Abdominal pain.  History of ovarian cysts.

EXAM:
PORTABLE CHEST 1 VIEW

[chest ap]
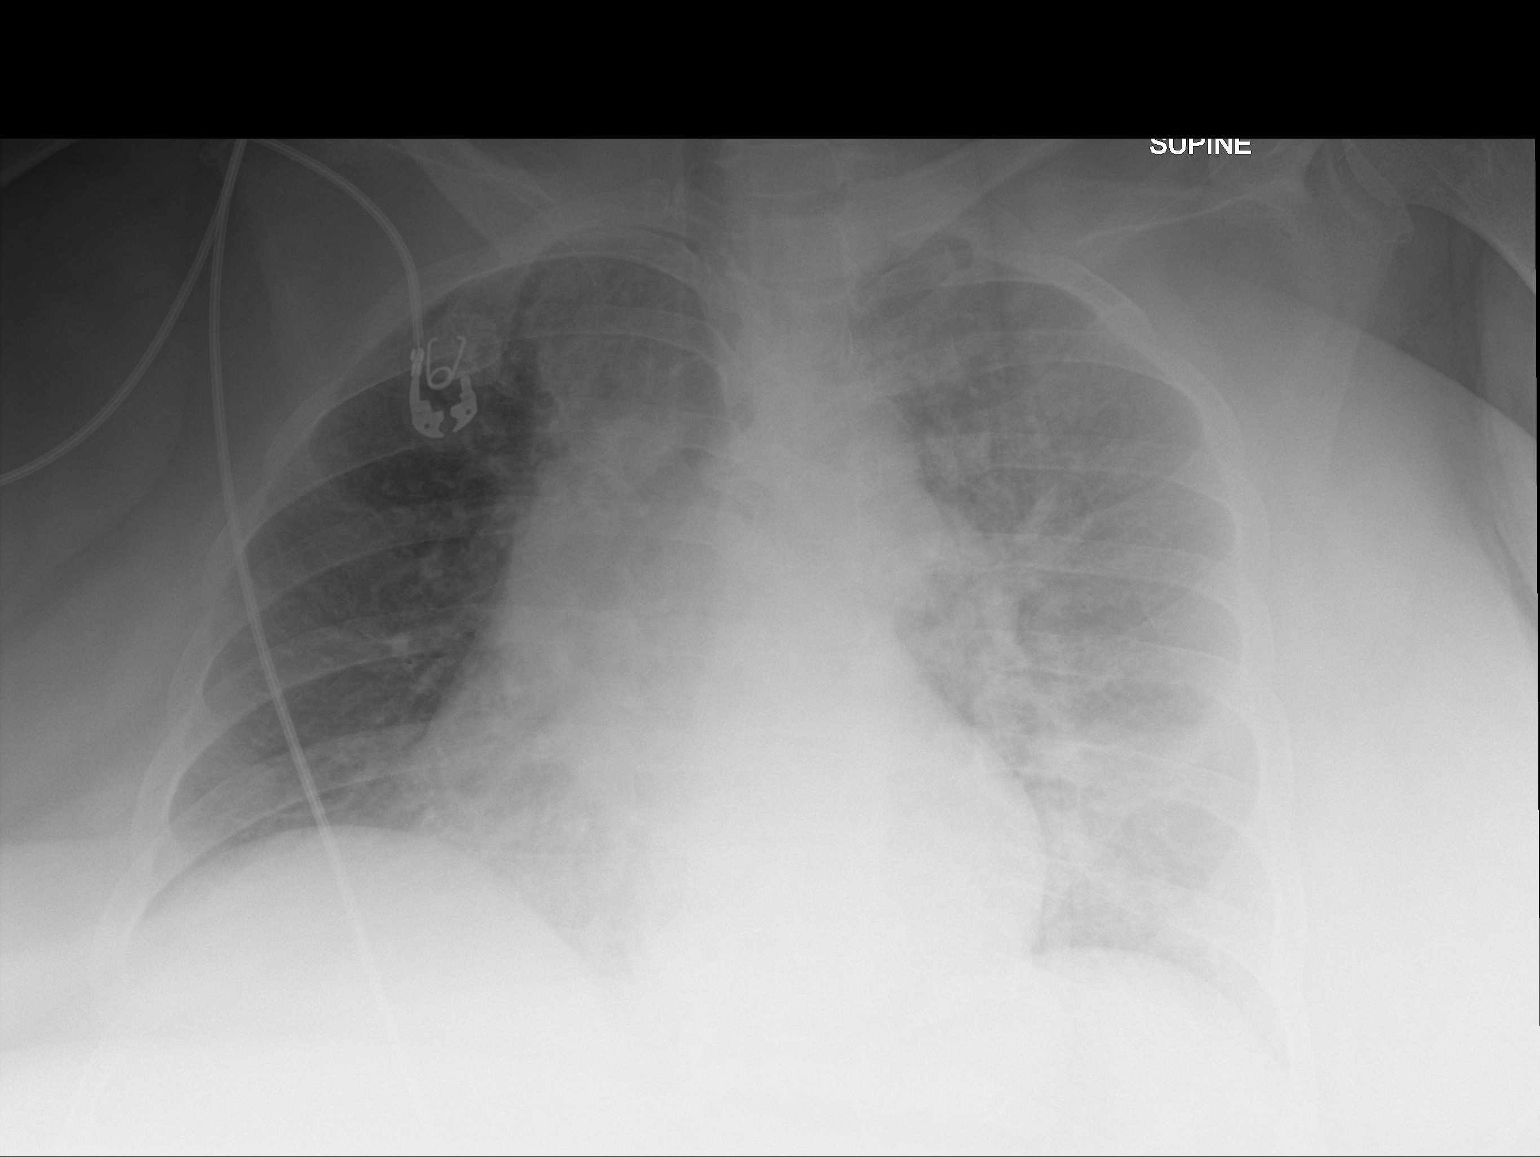

[1 of 1 positions shown; findings below may reference images not displayed]

FINDINGS: Shallow inspiration. Heart size and pulmonary vascularity are
normal. Increased density of the left chest likely is due to
overlying soft tissue attenuation and patient rotation. No definite
consolidation or effusion. No pneumothorax.
IMPRESSION: Shallow inspiration.  No evidence of active pulmonary disease.

## 2021-05-17 IMAGING — CT CT ABD-PELV W/O CM
2 of 4 series · 16 of 46 positions shown, 18 images · non-contrast
Comparison: [DATE], [DATE]

CLINICAL DATA: Epigastric and right upper abdominal pain



[Series 2: axial st · axial · 0.98mm/px · z∈[-487,-82]mm · 13 of 91 slices shown, 15 images]
[im 5/91  soft-tissue]
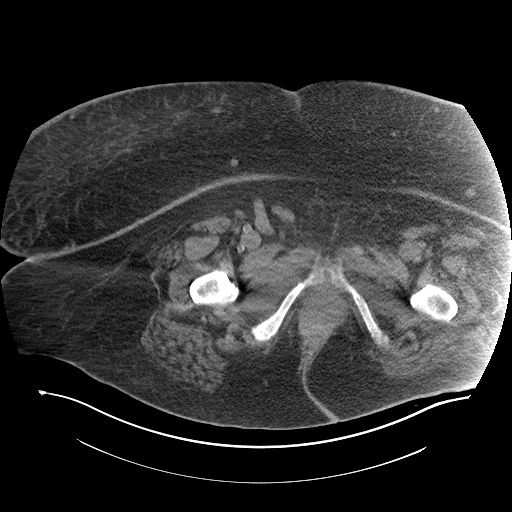
[im 5/91  bone]
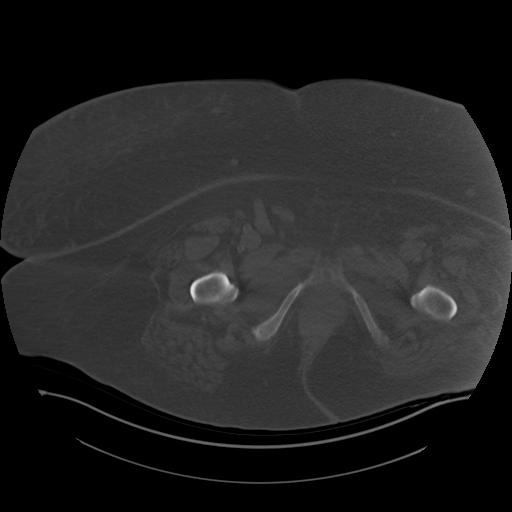
[im 14/91  soft-tissue]
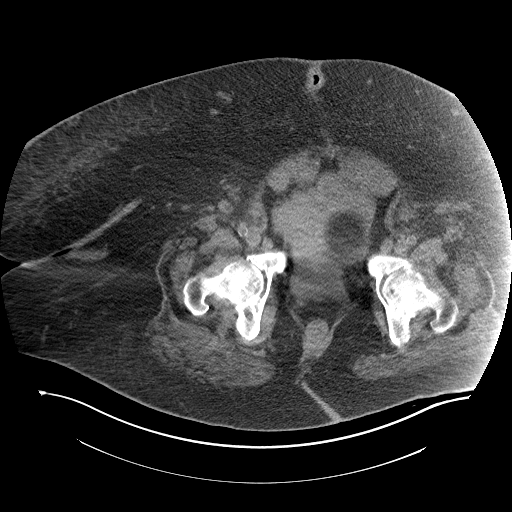
[im 19/91  soft-tissue]
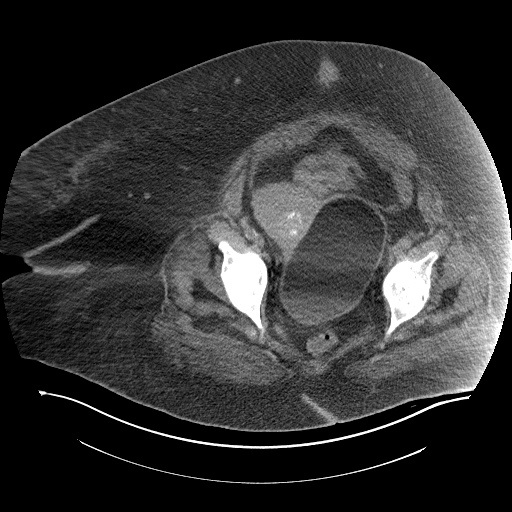
[im 28/91  soft-tissue]
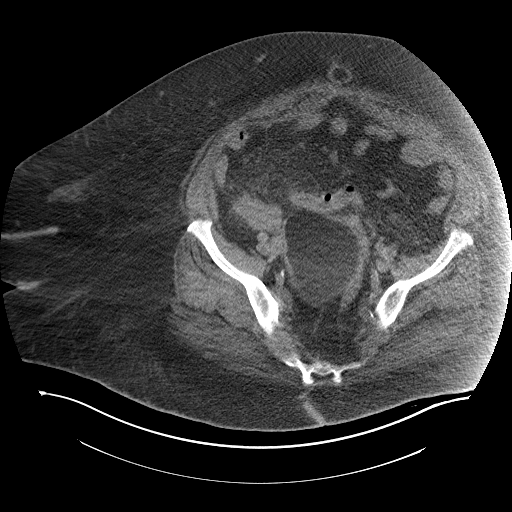
[im 32/91  soft-tissue]
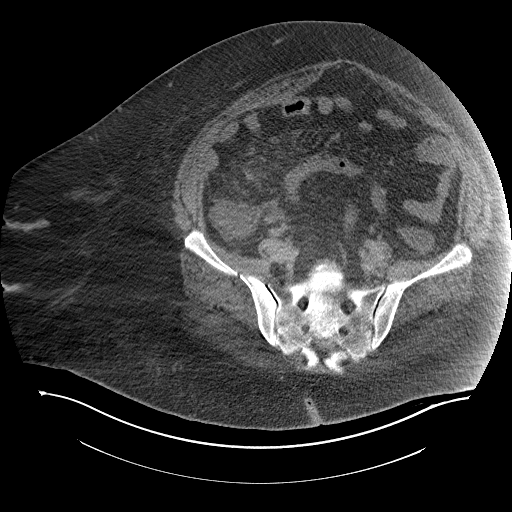
[im 41/91  soft-tissue]
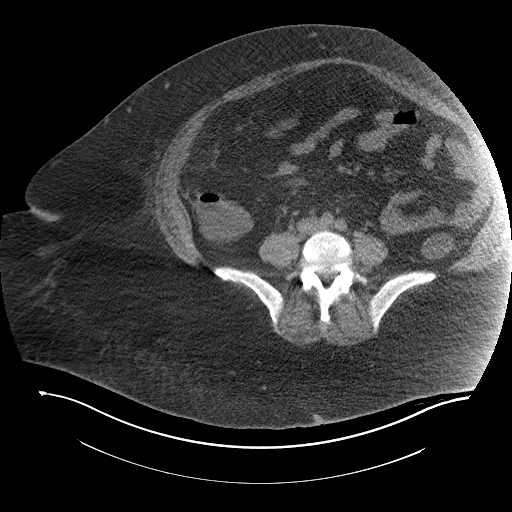
[im 46/91  soft-tissue]
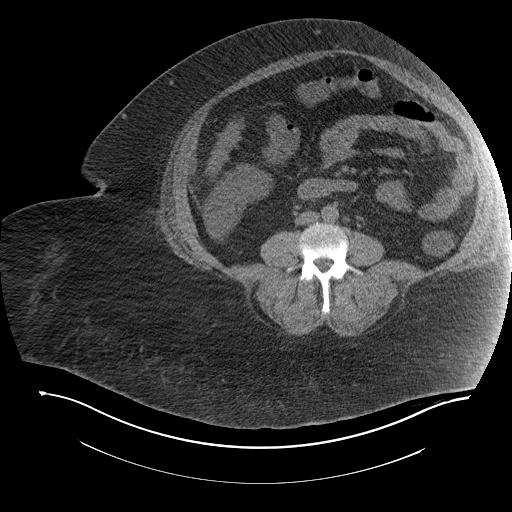
[im 50/91  soft-tissue]
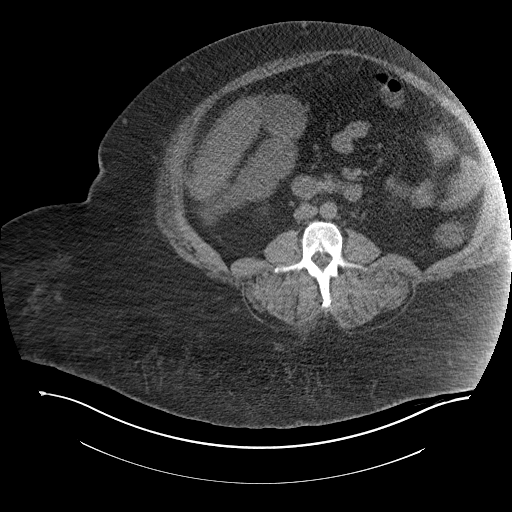
[im 59/91  soft-tissue]
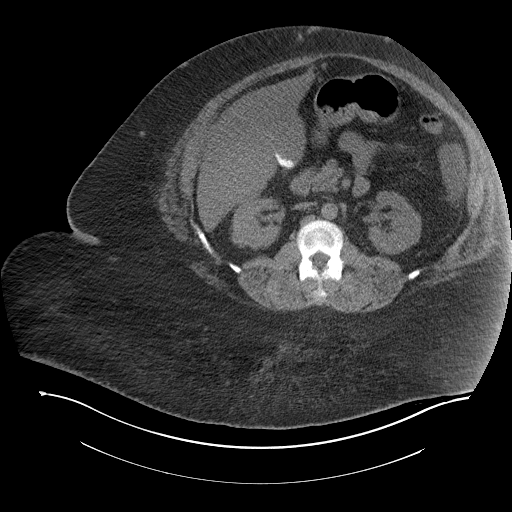
[im 59/91  bone]
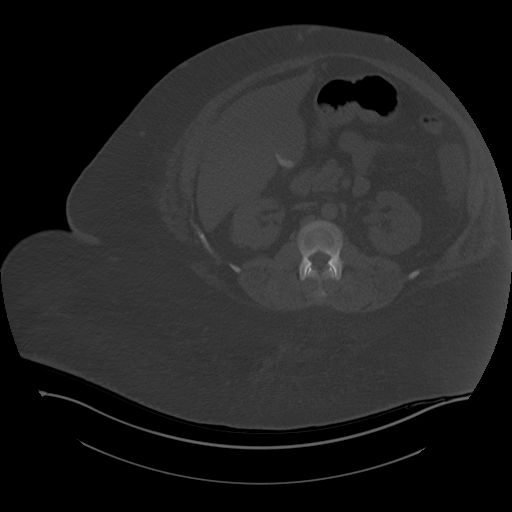
[im 64/91  soft-tissue]
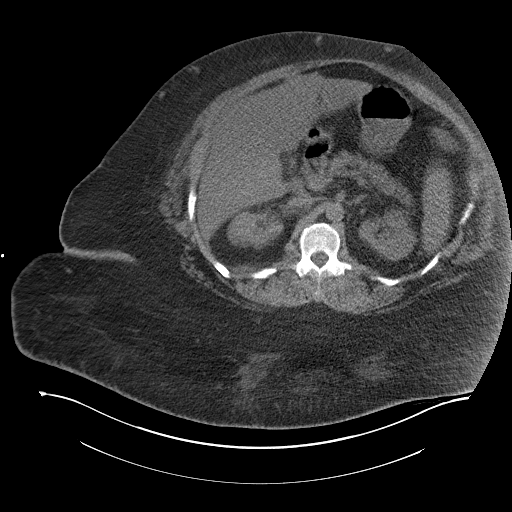
[im 73/91  soft-tissue]
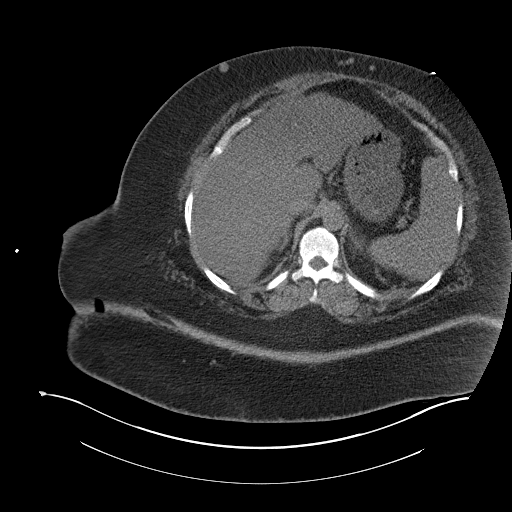
[im 77/91  soft-tissue]
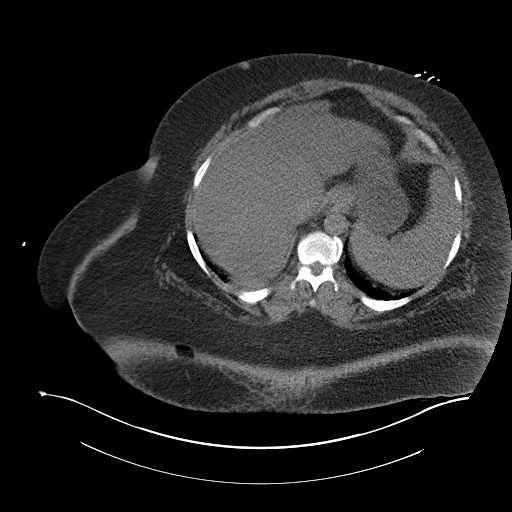
[im 86/91  soft-tissue]
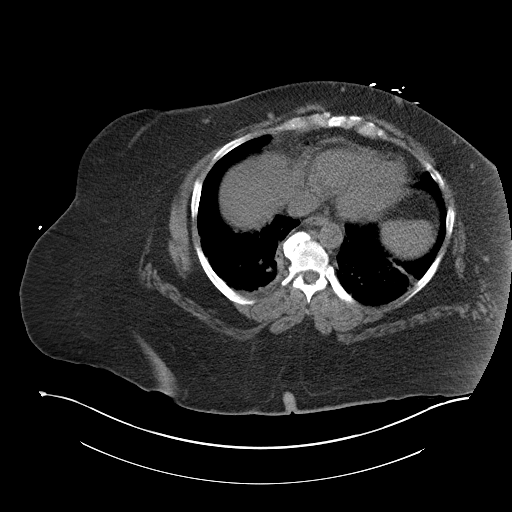

[Series 4: coronal st · coronal · 1.01mm/px · 3 of 216 slices shown]
[im 72/216  soft-tissue]
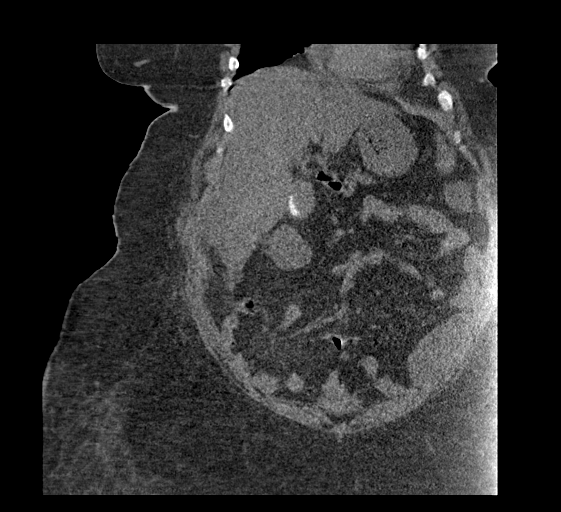
[im 96/216  soft-tissue]
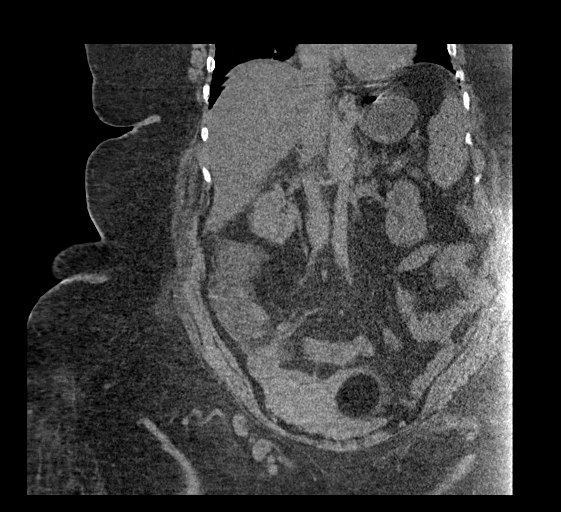
[im 120/216  soft-tissue]
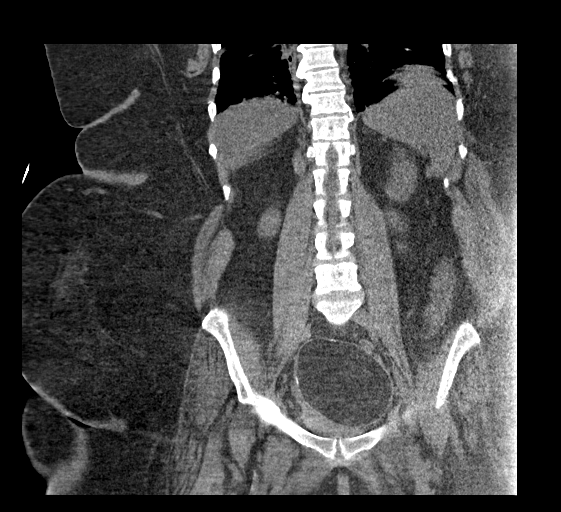

[16 of 46 positions shown; findings below may reference images not displayed]

FINDINGS: Lower chest: Scattered hypoventilatory changes at the lung bases. No
acute pleural or parenchymal lung disease.

Hepatobiliary: Calcified gallstones layer dependently in the
gallbladder. No evidence of acute cholecystitis. Unremarkable
unenhanced appearance of the liver.

Pancreas: Unremarkable unenhanced appearance.

Spleen: Unremarkable unenhanced appearance.

Adrenals/Urinary Tract: No urinary tract calculi or obstructive
uropathy within either kidney. Adrenals are unremarkable. Bladder is
decompressed, limiting its evaluation.

Stomach/Bowel: No bowel obstruction or ileus. No bowel wall
thickening or inflammatory change.

Vascular/Lymphatic: No significant vascular findings are present. No
enlarged abdominal or pelvic lymph nodes.

Reproductive: Fat containing left ovarian mass measuring up to
x 9.2 cm, consistent with dermoid. Right ovary is unremarkable.
Stable uterine calcifications consistent with fibroids.

Other: No free fluid or free gas.  No abdominal wall hernia.

Musculoskeletal: No acute or destructive bony lesions. Reconstructed
images demonstrate no additional findings.
IMPRESSION: 1. Cholelithiasis without cholecystitis.
2. Fat containing left ovarian mass compatible with dermoid,
measuring up to 13.3 cm.
3. Fibroid uterus.

## 2021-05-17 MED ORDER — MORPHINE SULFATE (PF) 4 MG/ML IV SOLN
4.0000 mg | Freq: Once | INTRAVENOUS | Status: AC
  Administered 2021-05-17: 4 mg via INTRAVENOUS
  Filled 2021-05-17: qty 1

## 2021-05-17 MED ORDER — ACETAMINOPHEN 325 MG PO TABS
650.0000 mg | ORAL_TABLET | Freq: Four times a day (QID) | ORAL | Status: DC | PRN
  Administered 2021-05-18: 650 mg via ORAL
  Filled 2021-05-17: qty 2

## 2021-05-17 MED ORDER — SODIUM CHLORIDE 0.9 % IV SOLN
2.0000 g | Freq: Every day | INTRAVENOUS | Status: DC
  Administered 2021-05-17 – 2021-05-24 (×8): 2 g via INTRAVENOUS
  Filled 2021-05-17 (×8): qty 20

## 2021-05-17 MED ORDER — SODIUM CHLORIDE 0.9 % IV BOLUS (SEPSIS)
1000.0000 mL | Freq: Once | INTRAVENOUS | Status: AC
Start: 2021-05-17 — End: 2021-05-17
  Administered 2021-05-17: 1000 mL via INTRAVENOUS

## 2021-05-17 MED ORDER — ACETAMINOPHEN 650 MG RE SUPP: 650.0000 mg | Freq: Four times a day (QID) | RECTAL | Status: DC | PRN

## 2021-05-17 MED ORDER — SODIUM CHLORIDE 0.9 % IV SOLN
100.0000 mg | Freq: Every day | INTRAVENOUS | Status: AC
  Administered 2021-05-18 – 2021-05-21 (×4): 100 mg via INTRAVENOUS
  Filled 2021-05-17 (×4): qty 20

## 2021-05-17 MED ORDER — MOMETASONE FURO-FORMOTEROL FUM 200-5 MCG/ACT IN AERO
2.0000 | INHALATION_SPRAY | Freq: Two times a day (BID) | RESPIRATORY_TRACT | Status: DC
  Administered 2021-05-18 (×2): 2 via RESPIRATORY_TRACT
  Filled 2021-05-17: qty 8.8

## 2021-05-17 MED ORDER — SODIUM CHLORIDE 0.9% FLUSH
3.0000 mL | Freq: Two times a day (BID) | INTRAVENOUS | Status: DC
  Administered 2021-05-18 – 2021-05-25 (×10): 3 mL via INTRAVENOUS

## 2021-05-17 MED ORDER — INSULIN ASPART 100 UNIT/ML IJ SOLN
0.0000 [IU] | Freq: Three times a day (TID) | INTRAMUSCULAR | Status: DC
  Filled 2021-05-17: qty 0.2

## 2021-05-17 MED ORDER — METRONIDAZOLE 500 MG/100ML IV SOLN
500.0000 mg | Freq: Two times a day (BID) | INTRAVENOUS | Status: DC
  Administered 2021-05-18 – 2021-05-25 (×15): 500 mg via INTRAVENOUS
  Filled 2021-05-17 (×15): qty 100

## 2021-05-17 MED ORDER — INSULIN GLARGINE-YFGN 100 UNIT/ML ~~LOC~~ SOLN: 35.0000 [IU] | Freq: Two times a day (BID) | SUBCUTANEOUS | Status: DC

## 2021-05-17 MED ORDER — ROSUVASTATIN CALCIUM 10 MG PO TABS
10.0000 mg | ORAL_TABLET | Freq: Every day | ORAL | Status: DC
  Administered 2021-05-18 – 2021-05-24 (×7): 10 mg via ORAL
  Filled 2021-05-17 (×7): qty 1

## 2021-05-17 MED ORDER — SODIUM CHLORIDE 0.9 % IV SOLN
1000.0000 mL | INTRAVENOUS | Status: DC
  Administered 2021-05-17: 1000 mL via INTRAVENOUS

## 2021-05-17 MED ORDER — LEVOFLOXACIN IN D5W 750 MG/150ML IV SOLN
750.0000 mg | Freq: Once | INTRAVENOUS | Status: DC
  Filled 2021-05-17: qty 150

## 2021-05-17 MED ORDER — LEVOTHYROXINE SODIUM 50 MCG PO TABS
50.0000 ug | ORAL_TABLET | Freq: Every day | ORAL | Status: DC
  Administered 2021-05-18 – 2021-05-25 (×8): 50 ug via ORAL
  Filled 2021-05-17 (×8): qty 1

## 2021-05-17 MED ORDER — ENOXAPARIN SODIUM 40 MG/0.4ML IJ SOSY
40.0000 mg | PREFILLED_SYRINGE | INTRAMUSCULAR | Status: DC
  Administered 2021-05-17 – 2021-05-19 (×3): 40 mg via SUBCUTANEOUS
  Filled 2021-05-17 (×3): qty 0.4

## 2021-05-17 MED ORDER — DEXTROSE 50 % IV SOLN
INTRAVENOUS | Status: AC
  Filled 2021-05-17: qty 50

## 2021-05-17 MED ORDER — LACTATED RINGERS IV SOLN: INTRAVENOUS | Status: DC

## 2021-05-17 MED ORDER — METRONIDAZOLE 500 MG/100ML IV SOLN
500.0000 mg | Freq: Once | INTRAVENOUS | Status: AC
  Administered 2021-05-17: 500 mg via INTRAVENOUS
  Filled 2021-05-17: qty 100

## 2021-05-17 MED ORDER — LACTATED RINGERS IV BOLUS (SEPSIS)
2000.0000 mL | Freq: Once | INTRAVENOUS | Status: AC
  Administered 2021-05-17: 2000 mL via INTRAVENOUS

## 2021-05-17 MED ORDER — PANTOPRAZOLE SODIUM 40 MG PO TBEC
40.0000 mg | DELAYED_RELEASE_TABLET | Freq: Every day | ORAL | Status: DC
  Administered 2021-05-18 – 2021-05-25 (×8): 40 mg via ORAL
  Filled 2021-05-17 (×8): qty 1

## 2021-05-17 MED ORDER — ALLOPURINOL 100 MG PO TABS
100.0000 mg | ORAL_TABLET | Freq: Every day | ORAL | Status: DC
  Administered 2021-05-18 – 2021-05-25 (×8): 100 mg via ORAL
  Filled 2021-05-17 (×8): qty 1

## 2021-05-17 MED ORDER — ALBUTEROL SULFATE HFA 108 (90 BASE) MCG/ACT IN AERS: 2.0000 | INHALATION_SPRAY | RESPIRATORY_TRACT | Status: DC | PRN

## 2021-05-17 MED ORDER — SODIUM CHLORIDE 0.9 % IV SOLN
200.0000 mg | Freq: Once | INTRAVENOUS | Status: AC
  Administered 2021-05-17: 200 mg via INTRAVENOUS
  Filled 2021-05-17: qty 40

## 2021-05-17 MED ORDER — ONDANSETRON HCL 4 MG/2ML IJ SOLN
4.0000 mg | Freq: Once | INTRAMUSCULAR | Status: AC
  Administered 2021-05-17: 4 mg via INTRAVENOUS
  Filled 2021-05-17: qty 2

## 2021-05-17 MED ORDER — ADULT MULTIVITAMIN W/MINERALS CH
1.0000 | ORAL_TABLET | Freq: Every day | ORAL | Status: DC
  Administered 2021-05-18 – 2021-05-25 (×8): 1 via ORAL
  Filled 2021-05-17 (×8): qty 1

## 2021-05-17 NOTE — ED Provider Notes (Signed)
[facility] DEPT Provider Note   CSN: [id number] Arrival date & time: 05/17/21  1507     History  Chief Complaint  Patient presents with   Abdominal Pain    Makayla [name] is a 56 y.o. female.   Abdominal Pain Associated symptoms: no fever    Patient presents to the ED for evaluation of abdominal pain.  Patient states it started hurting after she ate ribs this morning.  The pain is in the right upper part of her abdomen.  She denies having any trouble with nausea or vomiting.  No diarrhea.  No dysuria.  Pain is severe.  Patient mention to the nurse is having history of ovarian cysts and the pain being similar however patient told me the pain is in her right upper abdomen.  She denies any history of prior abdominal surgeries.  Home Medications Prior to Admission medications   Medication Sig Start Date End Date Taking? Authorizing Provider  acetaminophen (TYLENOL) 325 MG tablet Take 650 mg by mouth every 6 (six) hours as needed for moderate pain.    [provider]  albuterol (PROVENTIL HFA;VENTOLIN HFA) 108 (90 BASE) MCG/ACT inhaler Inhale 2 puffs into the lungs every 6 (six) hours as needed for wheezing or shortness of breath. For shortness of breath    [provider]  allopurinol (ZYLOPRIM) 100 MG tablet Take 100 mg by mouth daily. 04/11/21   [provider]  amLODipine (NORVASC) 5 MG tablet Take 1 tablet by mouth daily. 03/24/21 06/22/21  Kayleen Memos, DO  B-D UF III MINI PEN NEEDLES 31G X 5 MM MISC See admin instructions. 04/28/15   [provider]  cyclobenzaprine (FLEXERIL) 5 MG tablet Take 1 tablet twice a day as needed for Muscle spasms. Patient not taking: Reported on 05/16/2021 01/31/15   Elayne Snare, MD  furosemide (LASIX) 20 MG tablet Take 20 mg by mouth daily. 04/11/21   [provider]  glipiZIDE (GLUCOTROL) 5 MG tablet Take 5 mg by mouth 2 (two) times daily before a meal.    [provider]   glucose blood (ONETOUCH VERIO) test strip Use as instructed to check blood sugar 2 times daily Dx code E11.9 11/03/14   Elayne Snare, MD  Insulin Syringe-Needle U-100 (SAFETY-GLIDE 0.3CC SYR 29GX1/2) 29G X 1/2" 0.3 ML MISC Use as directed. 12/11/12   Hongalgi, Lenis Dickinson, MD  levothyroxine (SYNTHROID) 50 MCG tablet Take 50 mcg by mouth daily before breakfast. 04/11/21   [provider]  liraglutide (VICTOZA) 18 MG/3ML SOPN Inject 0.6 mg into the skin 2 (two) times daily.    [provider]  Multiple Vitamin (MULTIVITAMIN WITH MINERALS) TABS tablet Take 1 tablet by mouth daily. 04/20/21   Swayze, Ava, DO  omeprazole (PRILOSEC) 20 MG capsule Take 20 mg by mouth daily before breakfast.    [provider]  rosuvastatin (CRESTOR) 10 MG tablet Take 10 mg by mouth at bedtime. 04/11/21   [provider]  SYMBICORT 160-4.5 MCG/ACT inhaler Inhale 2 puffs into the lungs 2 (two) times daily. 10/02/17   [provider]  TRESIBA FLEXTOUCH 200 UNIT/ML FlexTouch Pen Inject 56 Units into the skin in the morning and at bedtime. 04/07/21   [provider]      Allergies    Penicillins, Ibuprofen, Peach flavor, and Strawberry extract    Review of Systems   Review of Systems  Constitutional:  Negative for fever.  Gastrointestinal:  Positive for abdominal pain.   Physical Exam  Updated Vital Signs BP (!) 146/89    Pulse (!) 123    Temp 100 F (37.8 C) (Oral)    Resp (!) 28    LMP 12/08/2012    SpO2 96%  Physical Exam Vitals and nursing note reviewed.  Constitutional:      General: She is not in acute distress.    Appearance: She is well-developed.     Comments: Elevated BMI  HENT:     Head: Normocephalic and atraumatic.     Right Ear: External ear normal.     Left Ear: External ear normal.  Eyes:     General: No scleral icterus.       Right eye: No discharge.        Left eye: No discharge.     Conjunctiva/sclera: Conjunctivae normal.  Neck:     Trachea: No  tracheal deviation.  Cardiovascular:     Rate and Rhythm: Normal rate and regular rhythm.  Pulmonary:     Effort: Pulmonary effort is normal. No respiratory distress.     Breath sounds: Normal breath sounds. No stridor. No wheezing or rales.  Abdominal:     General: Bowel sounds are normal. There is no distension.     Palpations: Abdomen is soft.     Tenderness: There is abdominal tenderness in the right upper quadrant. There is guarding. There is no rebound.  Musculoskeletal:        General: No tenderness or deformity.     Cervical back: Neck supple.  Skin:    General: Skin is warm and dry.     Findings: No rash.  Neurological:     General: No focal deficit present.     Mental Status: She is alert.     Cranial Nerves: No cranial nerve deficit (no facial droop, extraocular movements intact, no slurred speech).     Sensory: No sensory deficit.     Motor: No abnormal muscle tone or seizure activity.     Coordination: Coordination normal.  Psychiatric:        Mood and Affect: Mood normal.    ED Results / Procedures / Treatments   Labs (all labs ordered are listed, but only abnormal results are displayed) Labs Reviewed  RESP PANEL BY RT-PCR (FLU A&B, COVID) ARPGX2 - Abnormal; Notable for the following components:      Result Value   SARS Coronavirus 2 by RT PCR POSITIVE (*)    All other components within normal limits  COMPREHENSIVE METABOLIC PANEL - Abnormal; Notable for the following components:   Sodium 132 (*)    CO2 19 (*)    Creatinine, Ser 1.96 (*)    Calcium 8.5 (*)    Albumin 2.5 (*)    AST 97 (*)    GFR, Estimated 30 (*)    All other components within normal limits  CBC WITH DIFFERENTIAL/PLATELET - Abnormal; Notable for the following components:   WBC 16.2 (*)    Hemoglobin 11.7 (*)    RDW 17.3 (*)    Neutro Abs 13.0 (*)    Monocytes Absolute 1.2 (*)    Abs Immature Granulocytes 0.22 (*)    All other components within normal limits  LACTIC ACID, PLASMA -  Abnormal; Notable for the following components:   Lactic Acid, Venous 3.9 (*)    All other components within normal limits  CULTURE, BLOOD (ROUTINE X 2)  CULTURE, BLOOD (ROUTINE X 2)  LIPASE, BLOOD  URINALYSIS, ROUTINE W REFLEX MICROSCOPIC  LACTIC ACID, PLASMA  COMPREHENSIVE METABOLIC PANEL  CBC  I-STAT BETA HCG BLOOD, ED ([facility], [facility], [facility] ONLY)    EKG None  Radiology US Abdomen Complete  Result Date: 05/17/2021 CLINICAL DATA:  Right upper quadrant pain EXAM: ABDOMEN ULTRASOUND COMPLETE COMPARISON:  CT 03/21/2021 FINDINGS: Gallbladder: Multiple small shadowing stones and gallbladder sludge. Normal wall thickness. Negative sonographic Murphy. Common bile duct: Diameter: 6 mm Liver: Diffusely echogenic. No focal hepatic abnormality. Portal vein is patent on color Doppler imaging with normal direction of blood flow towards the liver. IVC: No abnormality visualized. Pancreas: Visualized portion unremarkable. Spleen: Size and appearance within normal limits. Right Kidney: Length: 9.8 cm. Echogenicity within normal limits. Mild upper pole hydronephrosis but no pelvic dilatation. Left Kidney: Length: 9.8 cm. Echogenicity within normal limits. No mass or hydronephrosis visualized. Abdominal aorta: No aneurysm visualized. Other findings: None. IMPRESSION: 1. Cholelithiasis without sonographic evidence for acute cholecystitis. 2. Echogenic liver consistent with hepatic steatosis 3. Mild upper pole caliectasis on the right without renal pelvis dilatation. Electronically Signed   By: Donavan Foil M.D.   On: 05/17/2021 19:31   DG Chest Portable 1 View  Result Date: 05/17/2021 CLINICAL DATA:  Abdominal pain.  History of ovarian cysts. EXAM: PORTABLE CHEST 1 VIEW COMPARISON:  03/21/2021 FINDINGS: Shallow inspiration. Heart size and pulmonary vascularity are normal. Increased density of the left chest likely is due to overlying soft tissue attenuation and patient rotation. No definite consolidation or effusion. No  pneumothorax. IMPRESSION: Shallow inspiration.  No evidence of active pulmonary disease. Electronically Signed   By: Lucienne Capers M.D.   On: 05/17/2021 19:42    Procedures Procedures    Medications Ordered in ED Medications  lactated ringers infusion (has no administration in time range)  cefTRIAXone (ROCEPHIN) 2 g in sodium chloride 0.9 % 100 mL IVPB (2 g Intravenous New Bag/Given 05/17/21 2013)  remdesivir 200 mg in sodium chloride 0.9% 250 mL IVPB (has no administration in time range)    Followed by  remdesivir 100 mg in sodium chloride 0.9 % 100 mL IVPB (has no administration in time range)  allopurinol (ZYLOPRIM) tablet 100 mg (has no administration in time range)  rosuvastatin (CRESTOR) tablet 10 mg (has no administration in time range)  pantoprazole (PROTONIX) EC tablet 40 mg (has no administration in time range)  multivitamin with minerals tablet 1 tablet (has no administration in time range)  enoxaparin (LOVENOX) injection 40 mg (has no administration in time range)  sodium chloride flush (NS) 0.9 % injection 3 mL (has no administration in time range)  acetaminophen (TYLENOL) tablet 650 mg (has no administration in time range)    Or  acetaminophen (TYLENOL) suppository 650 mg (has no administration in time range)  sodium chloride 0.9 % bolus 1,000 mL (0 mLs Intravenous Stopped 05/17/21 1959)  ondansetron (ZOFRAN) injection 4 mg (4 mg Intravenous Given 05/17/21 1643)  lactated ringers bolus 2,000 mL (2,000 mLs Intravenous New Bag/Given 05/17/21 1850)  metroNIDAZOLE (FLAGYL) IVPB 500 mg (0 mg Intravenous Stopped 05/17/21 2007)    ED Course/ Medical Decision Making/ A&P Clinical Course as of 05/17/21 2026  Wed May 17, 2021  1819 Notified that lactic acid level is elevated at 3.9. [JK]  4431 Metabolic panel shows increased BUN creatinine 1.96.  White count elevated at 16.2 [JK]  1819 CBC with Diff(!) [JK]  1913 Patient's COVID test is positive [JK]  1959 Ultrasound shows  cholelithiasis but no sonographic evidence of cholecystitis [JK]  2000 Lipase, blood Normal [JK]  2001 Patient remains persistently tachycardic  and tachypneic.  Will treat for her COVID infection [JK]  2025 Discussed with hospitalist service regarding admission, Dr Trilby Drummer [JK]    Clinical Course User Index [JK] Dorie Rank, MD                           Medical Decision Making Amount and/or Complexity of Data Reviewed Labs: ordered. Decision-making details documented in ED Course. Radiology: ordered.  Risk Prescription drug management. Decision regarding hospitalization.   Abdominal pain Patient presented with acute abdominal pain that occurred after eating ribs.  Patient appeared to be in significant pain and to have significant tenderness on exam.  No signs of pancreatitis.  She did have a leukocytosis however.  LFTs were slightly elevated.  Creatinine also increased compared to prior values.  Ultrasound was performed and shows signs of gallstones but no signs of cholecystitis.  On repeat exam patient is not tender in her right upper quadrant.  Suspect she had an episode of biliary colic  Tachycardia, dyspnea No evidence of pneumonia on exam but the patient does have an acute COVID infection.  She does remain persistently tachypneic and tachycardic.  No definite infiltrate on chest x-ray but she has elevated inflammatory markers with a lactic acidosis.  We will consult medical service for admission and started on remdesivir.  SIRS Patient was started on empiric antibiotics with her fever tachycardia.  No bacterial source at this time we still are waiting on urinalysis and blood cultures have been sent.  We will continue with hydration.  No indication for pressors at this time.  We will consult the medical service for admission and further treatment.          Final Clinical Impression(s) / ED Diagnoses Final diagnoses:  COVID-19 virus infection  Biliary colic    Rx / DC  Orders ED Discharge Orders     None         Dorie Rank, MD 05/17/21 2026

## 2021-05-17 NOTE — ED Notes (Signed)
This nurse paged the lab per the EDP's request, regarding the status of the BMP. Per lab "there was a problem with the specimen" despite the lab not calling the ED to inform that there needed to be a new BMP drawn. This nurse drawing repeat BMP at this time. EDP notified and aware.

## 2021-05-17 NOTE — H&P (Addendum)
History and Physical   Makayla [name] QVZ:[id number] DOB: 1965/12/19 DOA: 05/17/2021  PCP: Arthur Holms, NP   Patient coming from: Home  Chief Complaint: Abdominal pain  HPI: Makayla [name] is a 56 y.o. female with medical history significant of diabetes, CKD 3B, ovarian mass, obesity, hypothyroidism, hypertension, hyperlipidemia, intertrigo, cellulitis presenting with abdominal pain.  Patient states that she started having abdominal pain this morning after eating some ribs.  Pain is severe and located at the right upper quadrant.  She denies any nausea, vomiting, diarrhea.  She also denies any dysuria.  She reports a history of ovarian cyst and this feels similar to that however is in a different location (currently primarily right upper quadrant to epigastrium).  She states she may have some mild shortness of breath.  Reports a history of chronic bronchitis.  She denies fevers, chills, chest pain, constipation.  ED Course: Vital signs in the ED significant for temperature to 100, tachycardia in the 110s to 120s, respiratory rate in the 20s to 30s, blood pressure in the 329J to 188C systolic.  Lab work-up showed CMP with sodium 132, bicarb 19, creatinine elevated to 1.96 from baseline of 1.1, calcium 8.5 which corrects considering albumin of 2.5.  CBC with leukocytosis to 16.2 and hemoglobin stable at 11.7.  Lipase was normal.  Lactic acid initially elevated at 3.9 with repeat pending.  Respiratory panel for flu and COVID was positive for COVID.  Urinalysis and blood cultures are pending.  Chest x-ray showed no acute abnormality.  Abdominal ultrasound showed cholelithiasis without cholecystitis.  Also noted with echogenicity of the liver and mild right renal upper pole caliectasis.  Patient received ceftriaxone, Flagyl in the ED.  Also started on remdesivir.  Received 3 L of fluids and is on a rate of 150 cc an hour of LR.  Zofran also given in the ED.  Review of Systems: As per HPI otherwise all other  systems reviewed and are negative.  Past Medical History:  Diagnosis Date   Abnormal uterine bleeding    Cellulitis    Chronic bronchitis (HCC)    CKD (chronic kidney disease), stage III ([place])    Diabetes mellitus    Diabetes mellitus without complication ([place]) 1/66/0630   Qualifier: Diagnosis of  By: Doy Mince LPN, Megan     Hyperlipidemia    Hypertension    Hypokalemia 12/08/2012   Neuropathy     Past Surgical History:  Procedure Laterality Date   TONSILLECTOMY      Social History  reports that she has quit smoking. Her smoking use included cigarettes. She has a 2.30 pack-year smoking history. She has never used smokeless tobacco. She reports that she does not currently use alcohol. She reports that she does not use drugs.  Allergies  Allergen Reactions   Penicillins Shortness Of Breath    Inflates bronchitis Tolerates Keflex, Rocephin    Ibuprofen Other (See Comments)    Can not take due to kidney problems.    Peach Flavor Swelling    Peaches.    Strawberry Extract Swelling    Family History  Problem Relation Age of Onset   Diabetes Mother    Hypertension Mother    Hyperlipidemia Mother    Diabetes Father    Heart disease Father    Diabetes Sister   Reviewed on admission.  Prior to Admission medications   Medication Sig Start Date End Date Taking? Authorizing Provider  acetaminophen (TYLENOL) 325 MG tablet Take 650 mg by mouth every 6 (six) hours  as needed for moderate pain.    [provider]  albuterol (PROVENTIL HFA;VENTOLIN HFA) 108 (90 BASE) MCG/ACT inhaler Inhale 2 puffs into the lungs every 6 (six) hours as needed for wheezing or shortness of breath. For shortness of breath    [provider]  allopurinol (ZYLOPRIM) 100 MG tablet Take 100 mg by mouth daily. 04/11/21   [provider]  amLODipine (NORVASC) 5 MG tablet Take 1 tablet by mouth daily. 03/24/21 06/22/21  Kayleen Memos, DO  B-D UF III MINI PEN NEEDLES 31G X 5 MM MISC See  admin instructions. 04/28/15   [provider]  cyclobenzaprine (FLEXERIL) 5 MG tablet Take 1 tablet twice a day as needed for Muscle spasms. Patient not taking: Reported on 05/16/2021 01/31/15   Elayne Snare, MD  furosemide (LASIX) 20 MG tablet Take 20 mg by mouth daily. 04/11/21   [provider]  glipiZIDE (GLUCOTROL) 5 MG tablet Take 5 mg by mouth 2 (two) times daily before a meal.    [provider]  glucose blood (ONETOUCH VERIO) test strip Use as instructed to check blood sugar 2 times daily Dx code E11.9 11/03/14   Elayne Snare, MD  Insulin Syringe-Needle U-100 (SAFETY-GLIDE 0.3CC SYR 29GX1/2) 29G X 1/2" 0.3 ML MISC Use as directed. 12/11/12   Hongalgi, Lenis Dickinson, MD  levothyroxine (SYNTHROID) 50 MCG tablet Take 50 mcg by mouth daily before breakfast. 04/11/21   [provider]  liraglutide (VICTOZA) 18 MG/3ML SOPN Inject 0.6 mg into the skin 2 (two) times daily.    [provider]  Multiple Vitamin (MULTIVITAMIN WITH MINERALS) TABS tablet Take 1 tablet by mouth daily. 04/20/21   Swayze, Ava, DO  omeprazole (PRILOSEC) 20 MG capsule Take 20 mg by mouth daily before breakfast.    [provider]  rosuvastatin (CRESTOR) 10 MG tablet Take 10 mg by mouth at bedtime. 04/11/21   [provider]  SYMBICORT 160-4.5 MCG/ACT inhaler Inhale 2 puffs into the lungs 2 (two) times daily. 10/02/17   [provider]  TRESIBA FLEXTOUCH 200 UNIT/ML FlexTouch Pen Inject 56 Units into the skin in the morning and at bedtime. 04/07/21   [provider]    Physical Exam: Vitals:   05/17/21 1845 05/17/21 1930 05/17/21 2000 05/17/21 2100  BP: 103/85 (!) 125/93 (!) 146/89 (!) 124/57  Pulse: (!) 122 (!) 126 (!) 123 (!) 123  Resp: 19 (!) 32 (!) 28 (!) 30  Temp:      TempSrc:      SpO2: 94% 92% 96% 95%    Physical Exam Constitutional:      General: She is not in acute distress.    Appearance: Normal appearance. She is obese.  HENT:     Head:  Normocephalic and atraumatic.     Mouth/Throat:     Mouth: Mucous membranes are moist.     Pharynx: Oropharynx is clear.  Eyes:     Extraocular Movements: Extraocular movements intact.     Pupils: Pupils are equal, round, and reactive to light.  Cardiovascular:     Rate and Rhythm: Regular rhythm. Tachycardia present.     Pulses: Normal pulses.     Heart sounds: Normal heart sounds.  Pulmonary:     Effort: Pulmonary effort is normal. No respiratory distress.     Breath sounds: Wheezing present.     Comments: Tachypnea Abdominal:     General: Bowel sounds are normal. There is no distension.     Palpations: Abdomen is  soft.     Tenderness: There is abdominal tenderness in the right upper quadrant and epigastric area.  Musculoskeletal:        General: No swelling or deformity.  Skin:    General: Skin is warm and dry.  Neurological:     General: No focal deficit present.     Mental Status: Mental status is at baseline.   Labs on Admission: I have personally reviewed following labs and imaging studies  CBC: Recent Labs  Lab 05/17/21 1650  WBC 16.2*  NEUTROABS 13.0*  HGB 11.7*  HCT 36.9  MCV 83.7  PLT 025    Basic Metabolic Panel: Recent Labs  Lab 05/17/21 1650  NA 132*  K 4.3  CL 101  CO2 19*  GLUCOSE 73  BUN 20  CREATININE 1.96*  CALCIUM 8.5*    GFR: CrCl cannot be calculated (Unknown ideal weight.).  Liver Function Tests: Recent Labs  Lab 05/17/21 1650  AST 97*  ALT 26  ALKPHOS 118  BILITOT 0.7  PROT 7.6  ALBUMIN 2.5*    Urine analysis:    Component Value Date/Time   COLORURINE YELLOW 04/13/2021 0000   APPEARANCEUR CLOUDY (A) 04/13/2021 0000   LABSPEC 1.012 04/13/2021 0000   PHURINE 6.0 04/13/2021 0000   GLUCOSEU NEGATIVE 04/13/2021 0000   HGBUR LARGE (A) 04/13/2021 0000   BILIRUBINUR NEGATIVE 04/13/2021 0000   BILIRUBINUR Neg 07/28/2014 1307   KETONESUR NEGATIVE 04/13/2021 0000   PROTEINUR 100 (A) 04/13/2021 0000   UROBILINOGEN negative  07/28/2014 1307   UROBILINOGEN 0.2 12/08/2012 1252   NITRITE NEGATIVE 04/13/2021 0000   LEUKOCYTESUR LARGE (A) 04/13/2021 0000    Radiological Exams on Admission: US Abdomen Complete  Result Date: 05/17/2021 CLINICAL DATA:  Right upper quadrant pain EXAM: ABDOMEN ULTRASOUND COMPLETE COMPARISON:  CT 03/21/2021 FINDINGS: Gallbladder: Multiple small shadowing stones and gallbladder sludge. Normal wall thickness. Negative sonographic Murphy. Common bile duct: Diameter: 6 mm Liver: Diffusely echogenic. No focal hepatic abnormality. Portal vein is patent on color Doppler imaging with normal direction of blood flow towards the liver. IVC: No abnormality visualized. Pancreas: Visualized portion unremarkable. Spleen: Size and appearance within normal limits. Right Kidney: Length: 9.8 cm. Echogenicity within normal limits. Mild upper pole hydronephrosis but no pelvic dilatation. Left Kidney: Length: 9.8 cm. Echogenicity within normal limits. No mass or hydronephrosis visualized. Abdominal aorta: No aneurysm visualized. Other findings: None. IMPRESSION: 1. Cholelithiasis without sonographic evidence for acute cholecystitis. 2. Echogenic liver consistent with hepatic steatosis 3. Mild upper pole caliectasis on the right without renal pelvis dilatation. Electronically Signed   By: Donavan Foil M.D.   On: 05/17/2021 19:31   DG Chest Portable 1 View  Result Date: 05/17/2021 CLINICAL DATA:  Abdominal pain.  History of ovarian cysts. EXAM: PORTABLE CHEST 1 VIEW COMPARISON:  03/21/2021 FINDINGS: Shallow inspiration. Heart size and pulmonary vascularity are normal. Increased density of the left chest likely is due to overlying soft tissue attenuation and patient rotation. No definite consolidation or effusion. No pneumothorax. IMPRESSION: Shallow inspiration.  No evidence of active pulmonary disease. Electronically Signed   By: Lucienne Capers M.D.   On: 05/17/2021 19:42    EKG: Ordered, but not yet performed in the  ED.  Assessment/Plan Principal Problem:   SIRS (systemic inflammatory response syndrome) (HCC) Active Problems:   Hyperlipidemia associated with type 2 diabetes mellitus ([place])   Hypertension associated with diabetes ([place])   Type 2 diabetes mellitus with chronic kidney disease, with long-term current use of insulin ([place])  Hypothyroidism   Acute renal failure superimposed on stage 3b chronic kidney disease ([place])   COVID-19 virus infection   Abdominal pain   Chronic bronchitis (HCC)   SIRS Abdominal pain > Patient presenting with abdominal pain.  Found to have leukocytosis and is SIRS positive with tachycardia, tachypnea in addition to leukocytosis.  No source yet identified.  Lactic acid notably elevated to 3.9 on first check in the ED. > Patient also noted to have COVID as below.  Unclear if this is SIRS from [place] with incidental biliary colic versus possible sepsis from unknown source with incidental COVID. > Ultrasound in the ED showed cholelithiasis without cholecystitis and echogenic liver and a right renal pole caliectasis.  Chest x-ray showed no acute normality.  Urinalysis is pending.  No evidence of recurrent cellulitic changes of pannus. > Abdominal pain has improved somewhat in the ED. but continues to be significantly tender on palpation despite receiving morphine. > Patient started on antibiotics for intra-abdominal infection with ceftriaxone and Flagyl in the ED. - Monitor on progressive now for possible sepsis. - Continue with antibiotics, ceftriaxone and Flagyl - CT abdomen pelvis without contrast considering AKI to further elucidate any abdominal source - Trend fever and white count - Procalcitonin - Trend lactic acid  COVID-19 infection > Patient noted to be COVID-19 positive.  As above unclear if SIRS positivity is secondary to COVID with incidental biliary colic versus sepsis with unknown source with incidental COVID. > Due to possibility of COVID being source of  SIRS patient started on remdesivir in the ED. > No respiratory symptoms on presentation, when I spoke with her she had mild shortness of breath but good oxygenation. - Continue remdesivir - Continue IV fluids as above - Supportive care, continue home inhalers - Trend CRP -Procalcitonin as above  AKI on CKD 3B > Creatinine noted to be elevated to 1.96 from baseline of 1.1. > Received 3 L of IV fluids in the ED and started on a rate - Continue with IV fluids at 150 cc an hour - Trend renal function electrolytes - Avoid nephrotoxic agents  Chronic bronchitis > Reports history of chronic bronchitis.  Some wheezing on exam.  Also known COVID infection as above. - Replace home Symbicort with formulary Dulera - As needed albuterol  Diabetes > On oral medications and insulin outpatient.  Home regimen is 56 units twice daily - Continue with 35 units twice daily, hold dose this evening given her blood sugar was in the low normal range. - SSI  Hypertension - Holding home amlodipine and Lasix for now considering concern for possible sepsis versus SIRS  Hyperlipidemia - Continue home rosuvastatin  Hypothyroidism - Continue home Synthroid at 50 mcg/day  DVT prophylaxis: Lovenox Code Status:   DNR Family Communication:  Called patient's sister, at phone number listed in chart, however there was no answer.  Disposition Plan:   Patient is from:  Home  Anticipated DC to:  Home  Anticipated DC date:  1 to 4 days  Anticipated DC barriers: None  Consults called:  None Admission status:  Observation, progressive  Severity of Illness: The appropriate patient status for this patient is OBSERVATION. Observation status is judged to be reasonable and necessary in order to provide the required intensity of service to ensure the patient's safety. The patient's presenting symptoms, physical exam findings, and initial radiographic and laboratory data in the context of their medical condition is felt  to place them at decreased risk for further clinical deterioration. Furthermore,  it is anticipated that the patient will be medically stable for discharge from the hospital within 2 midnights of admission.    Marcelyn Bruins MD [facility]  How to contact the [facility] Attending or Consulting provider [place] or covering provider during after hours [place], for this patient?   Check the care team in [facility] and look for a) attending/consulting [facility] provider listed and b) the [facility] team listed Log into [email or web address] and use [facility]'s universal password to access. If you do not have the password, please contact the hospital operator. Locate the [facility] provider you are looking for under [facility] and page to a number that you can be directly reached. If you still have difficulty reaching the provider, please page the [facility] (Director on Call) for the Hospitalists listed on amion for assistance.  05/17/2021, 9:12 PM

## 2021-05-17 NOTE — ED Notes (Signed)
Lactic 3.9. Received from lab. After 2nd page.

## 2021-05-17 NOTE — Sepsis Progress Note (Signed)
Elink tracking the Code Sepsis. 

## 2021-05-17 NOTE — ED Triage Notes (Signed)
Pt presents to the ED via EMS for abd pain. Pt has a hx of ovarian cysts and is c/o abd pain. She states her current abd pain feels similar to previous. Pt denies any accompanying sx.

## 2021-05-17 NOTE — ED Notes (Signed)
Murriel Hopper  Pt's Sister:  [phone]

## 2021-05-17 NOTE — ED Notes (Signed)
EDP notified of pt's VS triggering sepsis at this time and sepsis blood work pulled and sent to lab at this time.

## 2021-05-17 NOTE — ED Notes (Signed)
Pt CBG is 54 RN made aware

## 2021-05-17 NOTE — Sepsis Progress Note (Signed)
Notified bedside nurse of need to administer antibiotics and complete the IV fluid resuscitation before collecting a REPEAT Lactic Acid.

## 2021-05-18 DIAGNOSIS — N170 Acute kidney failure with tubular necrosis: Secondary | ICD-10-CM

## 2021-05-18 DIAGNOSIS — I152 Hypertension secondary to endocrine disorders: Secondary | ICD-10-CM | POA: Diagnosis present

## 2021-05-18 DIAGNOSIS — J42 Unspecified chronic bronchitis: Secondary | ICD-10-CM | POA: Diagnosis present

## 2021-05-18 DIAGNOSIS — E11649 Type 2 diabetes mellitus with hypoglycemia without coma: Secondary | ICD-10-CM | POA: Diagnosis present

## 2021-05-18 DIAGNOSIS — Z6841 Body Mass Index (BMI) 40.0 and over, adult: Secondary | ICD-10-CM | POA: Diagnosis not present

## 2021-05-18 DIAGNOSIS — E785 Hyperlipidemia, unspecified: Secondary | ICD-10-CM | POA: Diagnosis present

## 2021-05-18 DIAGNOSIS — R7989 Other specified abnormal findings of blood chemistry: Secondary | ICD-10-CM | POA: Diagnosis present

## 2021-05-18 DIAGNOSIS — L89102 Pressure ulcer of unspecified part of back, stage 2: Secondary | ICD-10-CM | POA: Diagnosis present

## 2021-05-18 DIAGNOSIS — R1010 Upper abdominal pain, unspecified: Secondary | ICD-10-CM

## 2021-05-18 DIAGNOSIS — K760 Fatty (change of) liver, not elsewhere classified: Secondary | ICD-10-CM | POA: Diagnosis present

## 2021-05-18 DIAGNOSIS — E1122 Type 2 diabetes mellitus with diabetic chronic kidney disease: Secondary | ICD-10-CM | POA: Diagnosis present

## 2021-05-18 DIAGNOSIS — A4189 Other specified sepsis: Secondary | ICD-10-CM | POA: Diagnosis present

## 2021-05-18 DIAGNOSIS — Z66 Do not resuscitate: Secondary | ICD-10-CM | POA: Diagnosis present

## 2021-05-18 DIAGNOSIS — K8020 Calculus of gallbladder without cholecystitis without obstruction: Secondary | ICD-10-CM | POA: Diagnosis present

## 2021-05-18 DIAGNOSIS — R6520 Severe sepsis without septic shock: Secondary | ICD-10-CM | POA: Diagnosis present

## 2021-05-18 DIAGNOSIS — R7881 Bacteremia: Secondary | ICD-10-CM | POA: Diagnosis not present

## 2021-05-18 DIAGNOSIS — J9621 Acute and chronic respiratory failure with hypoxia: Secondary | ICD-10-CM | POA: Diagnosis present

## 2021-05-18 DIAGNOSIS — A419 Sepsis, unspecified organism: Secondary | ICD-10-CM | POA: Diagnosis present

## 2021-05-18 DIAGNOSIS — R6510 Systemic inflammatory response syndrome (SIRS) of non-infectious origin without acute organ dysfunction: Secondary | ICD-10-CM | POA: Diagnosis present

## 2021-05-18 DIAGNOSIS — E1169 Type 2 diabetes mellitus with other specified complication: Secondary | ICD-10-CM | POA: Diagnosis present

## 2021-05-18 DIAGNOSIS — K8050 Calculus of bile duct without cholangitis or cholecystitis without obstruction: Secondary | ICD-10-CM | POA: Diagnosis present

## 2021-05-18 DIAGNOSIS — J410 Simple chronic bronchitis: Secondary | ICD-10-CM | POA: Diagnosis not present

## 2021-05-18 DIAGNOSIS — N179 Acute kidney failure, unspecified: Secondary | ICD-10-CM | POA: Diagnosis present

## 2021-05-18 DIAGNOSIS — N1832 Chronic kidney disease, stage 3b: Secondary | ICD-10-CM | POA: Diagnosis present

## 2021-05-18 DIAGNOSIS — U071 COVID-19: Secondary | ICD-10-CM | POA: Diagnosis present

## 2021-05-18 DIAGNOSIS — E1140 Type 2 diabetes mellitus with diabetic neuropathy, unspecified: Secondary | ICD-10-CM | POA: Diagnosis present

## 2021-05-18 DIAGNOSIS — E1165 Type 2 diabetes mellitus with hyperglycemia: Secondary | ICD-10-CM | POA: Diagnosis not present

## 2021-05-18 DIAGNOSIS — E871 Hypo-osmolality and hyponatremia: Secondary | ICD-10-CM | POA: Diagnosis present

## 2021-05-18 DIAGNOSIS — E039 Hypothyroidism, unspecified: Secondary | ICD-10-CM | POA: Diagnosis present

## 2021-05-18 LAB — LACTIC ACID, PLASMA
Lactic Acid, Venous: 2 mmol/L (ref 0.5–1.9)
Lactic Acid, Venous: 2.5 mmol/L (ref 0.5–1.9)
Lactic Acid, Venous: 3.4 mmol/L (ref 0.5–1.9)

## 2021-05-18 LAB — PROCALCITONIN
Procalcitonin: 123.56 ng/mL
Procalcitonin: 133.94 ng/mL

## 2021-05-18 LAB — CBG MONITORING, ED
Glucose-Capillary: 100 mg/dL — ABNORMAL HIGH (ref 70–99)
Glucose-Capillary: 100 mg/dL — ABNORMAL HIGH (ref 70–99)
Glucose-Capillary: 102 mg/dL — ABNORMAL HIGH (ref 70–99)
Glucose-Capillary: 76 mg/dL (ref 70–99)
Glucose-Capillary: 81 mg/dL (ref 70–99)
Glucose-Capillary: 84 mg/dL (ref 70–99)
Glucose-Capillary: 85 mg/dL (ref 70–99)
Glucose-Capillary: 87 mg/dL (ref 70–99)
Glucose-Capillary: 91 mg/dL (ref 70–99)
Glucose-Capillary: 99 mg/dL (ref 70–99)

## 2021-05-18 LAB — COMPREHENSIVE METABOLIC PANEL
ALT: 21 U/L (ref 0–44)
AST: 68 U/L — ABNORMAL HIGH (ref 15–41)
Albumin: 2 g/dL — ABNORMAL LOW (ref 3.5–5.0)
Alkaline Phosphatase: 241 U/L — ABNORMAL HIGH (ref 38–126)
Anion gap: 10 (ref 5–15)
BUN: 20 mg/dL (ref 6–20)
CO2: 19 mmol/L — ABNORMAL LOW (ref 22–32)
Calcium: 7.7 mg/dL — ABNORMAL LOW (ref 8.9–10.3)
Chloride: 100 mmol/L (ref 98–111)
Creatinine, Ser: 2.04 mg/dL — ABNORMAL HIGH (ref 0.44–1.00)
GFR, Estimated: 29 mL/min — ABNORMAL LOW (ref >60)
Glucose, Bld: 158 mg/dL — ABNORMAL HIGH (ref 70–99)
Potassium: 4 mmol/L (ref 3.5–5.1)
Sodium: 129 mmol/L — ABNORMAL LOW (ref 135–145)
Total Bilirubin: 0.5 mg/dL (ref 0.3–1.2)
Total Protein: 6.3 g/dL — ABNORMAL LOW (ref 6.5–8.1)

## 2021-05-18 LAB — CBC
HCT: 32.3 % — ABNORMAL LOW (ref 36.0–46.0)
Hemoglobin: 10.2 g/dL — ABNORMAL LOW (ref 12.0–15.0)
MCH: 26.9 pg (ref 26.0–34.0)
MCHC: 31.6 g/dL (ref 30.0–36.0)
MCV: 85.2 fL (ref 80.0–100.0)
Platelets: 211 10*3/uL (ref 150–400)
RBC: 3.79 MIL/uL — ABNORMAL LOW (ref 3.87–5.11)
RDW: 17.4 % — ABNORMAL HIGH (ref 11.5–15.5)
WBC: 15.1 10*3/uL — ABNORMAL HIGH (ref 4.0–10.5)
nRBC: 0 % (ref 0.0–0.2)

## 2021-05-18 LAB — C-REACTIVE PROTEIN: CRP: 29 mg/dL — ABNORMAL HIGH (ref <1.0)

## 2021-05-18 MED ORDER — INSULIN ASPART 100 UNIT/ML IJ SOLN
0.0000 [IU] | Freq: Three times a day (TID) | INTRAMUSCULAR | Status: DC
  Administered 2021-05-19: 3 [IU] via SUBCUTANEOUS
  Administered 2021-05-19: 1 [IU] via SUBCUTANEOUS
  Filled 2021-05-18: qty 0.09

## 2021-05-18 MED ORDER — INSULIN ASPART 100 UNIT/ML IJ SOLN
0.0000 [IU] | Freq: Every day | INTRAMUSCULAR | Status: DC
  Administered 2021-05-19 – 2021-05-21 (×2): 3 [IU] via SUBCUTANEOUS
  Administered 2021-05-22: 2 [IU] via SUBCUTANEOUS
  Filled 2021-05-18: qty 0.05

## 2021-05-18 MED ORDER — LACTATED RINGERS IV SOLN: INTRAVENOUS | Status: DC

## 2021-05-18 MED ORDER — DEXTROSE 10 % IV SOLN: INTRAVENOUS | Status: AC

## 2021-05-18 MED ORDER — OXYCODONE-ACETAMINOPHEN 5-325 MG PO TABS
1.0000 | ORAL_TABLET | ORAL | Status: DC | PRN
  Administered 2021-05-18: 2 via ORAL
  Administered 2021-05-20: 1 via ORAL
  Administered 2021-05-20 – 2021-05-22 (×2): 2 via ORAL
  Filled 2021-05-18 (×4): qty 2

## 2021-05-18 MED ORDER — METHYLPREDNISOLONE SODIUM SUCC 40 MG IJ SOLR
40.0000 mg | Freq: Two times a day (BID) | INTRAMUSCULAR | Status: DC
  Administered 2021-05-18 – 2021-05-21 (×6): 40 mg via INTRAVENOUS
  Filled 2021-05-18 (×6): qty 1

## 2021-05-18 NOTE — Assessment & Plan Note (Addendum)
-   Unclear etiology, CT abdomen showed cholelithiasis without cholecystitis, no pancreatitis or diverticulitis/colitis on CT.  Lipase 24.  No ovarian torsion seen on CT abdomen. -Abdominal ultrasound showed cholelithiasis without sonographic evidence for acute cholecystitis. - blood cultures+ bacteroids fragilis. S/p IV Rocephin, Flagyl.  -LFTs mildly elevated however patient has no fevers or abdominal pain, tolerating diet.  Discussed with general surgery, Dr. Donne Hazel, recommended outpatient follow-up, no urgent need for cholecystectomy inpatient. - abx completed prior to discharge

## 2021-05-18 NOTE — Progress Notes (Signed)
[facility]                                                                               Heatherly Stenner, is a 56 y.o. female, DOB - [birth year], FOY:[id number] Admit date - 05/17/2021    Outpatient Primary MD for the patient is Arthur Holms, NP  LOS - 0  days    Brief summary   Patient is a 56 year old female with diabetes mellitus, CKD stage IIIb, obesity, hypothyroidism, hypertension, hyperlipidemia presented with abdominal pain.  Patient reported that she started having abdominal pain on the morning of admission after eating some ribs, severe, right upper quadrant.  No nausea vomiting or diarrhea.  Also reported mild shortness of breath, has history of chronic bronchitis.  No fevers or chills or constipation  In ED, temp 100 F, pulse 119, BP 114/74, respiratory rate 28-30, O2 sats 97 to 100% on room air Sodium 132, creatinine 1.96, lactic acidosis 3.9, procalcitonin 133.94.  COVID-19 positive.  Patient was admitted with sepsis secondary to COVID-19.  Abdominal pain due to unclear etiology.   Assessment & Plan    Assessment and Plan: * Sepsis ([place])- (present on admission) - Patient met severe sepsis criteria at the time of admission with tachycardia, borderline BP, lactic acidosis, acute kidney injury on CKD, lactic acidosis, possibly due to COVID-19 however abdominal pain etiology has not been ascertained yet. -Lactic acid 3.9 at the time of admission trending down to 3.4,  Recheck. -Procalcitonin 133.94 at the time of admission, improving to 1.3, continue IV antibiotics.  Blood cultures negative so far  Abdominal pain - Unclear etiology, CT abdomen showed cholelithiasis without cholecystitis, no pancreatitis or diverticulitis/colitis on CT.  Lipase 24.  No ovarian torsion seen on CT abdomen. -Abdominal ultrasound showed cholelithiasis without sonographic evidence for acute cholecystitis. -Mildly elevated LFTs.  If continues to have abdominal pain, will obtain HIDA scan  for further work-up. -For now continue IV Rocephin, Flagyl  COVID-19 virus infection - Presented with sepsis, tachycardia, borderline BP, lactic acidosis, febrile, COVID-19 positive -Currently no hypoxia.  Chest x-ray showed shallow inspiration but no infiltrates. -Continue IV fluid hydration, started on IV remdesivir.  Hold off on steroids as no hypoxia.  Chronic bronchitis ([place]) - Placed on scheduled nebs, incentive spirometry, flutter valve   Acute renal failure superimposed on stage 3b chronic kidney disease ([place]) - Likely worsened due to severe sepsis, COVID-19, dehydration -Creatinine 1.5 mg/23/23, presented with creatinine of 7.67 with metabolic acidosis, lactic acidosis -Increase IV fluid hydration, placed on [place] lactate at 125 cc/ hour  Type 2 diabetes mellitus with chronic kidney disease, with long-term current use of insulin ([place]) - In the ED, noted to have hypoglycemia, was placed on D10 drip. -CBGs currently holding stable, will DC D10 drip.  Continue only sliding scale insulin, hold basal insulin.  Hypertension associated with diabetes ([place])- (present on admission) - BP currently soft, hold antihypertensives.  Continue IV fluids.  Hypothyroidism- (present on admission) - Continue Synthroid  Obesity, morbid ([place])- (present on admission) - Encourage lifestyle changes -Estimated body mass index is 56.45 kg/m   Hyperlipidemia associated with type 2 diabetes mellitus ([place])- (present on admission) - Continue statin  Code Status: Full CODE STATUS DVT Prophylaxis:  enoxaparin (LOVENOX) injection 40 mg Start: 05/17/21 2200   Level of Care: Level of care: Progressive Family Communication:  Disposition Plan:     Remains inpatient appropriate: Acute kidney injury, COVID-19, with severe sepsis, abdominal pain of unclear etiology, needs further work-up.  Procedures:  None  Consultants:   None  Antimicrobials:   Anti-infectives (From admission, onward)    Start      Dose/Rate Route Frequency Ordered Stop   05/18/21 1000  remdesivir 100 mg in sodium chloride 0.9 % 100 mL IVPB       See Hyperspace for full Linked Orders Report.   100 mg 200 mL/hr over 30 Minutes Intravenous Daily 05/17/21 2008 05/22/21 0959   05/18/21 0600  metroNIDAZOLE (FLAGYL) IVPB 500 mg        500 mg 100 mL/hr over 60 Minutes Intravenous Every 12 hours 05/17/21 2110     05/17/21 2100  remdesivir 200 mg in sodium chloride 0.9% 250 mL IVPB       See Hyperspace for full Linked Orders Report.   200 mg 580 mL/hr over 30 Minutes Intravenous Once 05/17/21 2008 05/17/21 2127   05/17/21 1845  cefTRIAXone (ROCEPHIN) 2 g in sodium chloride 0.9 % 100 mL IVPB        2 g 200 mL/hr over 30 Minutes Intravenous Daily-1800 05/17/21 1830     05/17/21 1830  levofloxacin (LEVAQUIN) IVPB 750 mg  Status:  Discontinued        750 mg 100 mL/hr over 90 Minutes Intravenous  Once 05/17/21 1822 05/17/21 1830   05/17/21 1830  metroNIDAZOLE (FLAGYL) IVPB 500 mg        500 mg 100 mL/hr over 60 Minutes Intravenous  Once 05/17/21 1822 05/17/21 2007        Medications  Scheduled Meds:  allopurinol  100 mg Oral Daily   enoxaparin (LOVENOX) injection  40 mg Subcutaneous Q24H   insulin aspart  0-5 Units Subcutaneous QHS   insulin aspart  0-9 Units Subcutaneous TID WC   levothyroxine  50 mcg Oral Q0600   mometasone-formoterol  2 puff Inhalation BID   multivitamin with minerals  1 tablet Oral Daily   pantoprazole  40 mg Oral Daily   rosuvastatin  10 mg Oral QHS   sodium chloride flush  3 mL Intravenous Q12H   Continuous Infusions:  cefTRIAXone (ROCEPHIN)  IV Stopped (05/17/21 2044)   lactated ringers 125 mL/hr at 05/18/21 1057   metronidazole Stopped (05/18/21 0834)   remdesivir 100 mg in NS 100 mL Stopped (05/18/21 1056)   PRN Meds:.acetaminophen **OR** acetaminophen, albuterol    Subjective:   Elco Jon was seen and examined today.  Complaining of abdominal pain, having cough and  congestion.  Afebrile this morning.  No acute nausea or vomiting, chest pain.    Objective:   Vitals:   05/18/21 0600 05/18/21 0615 05/18/21 0800 05/18/21 0930  BP: 123/69  127/83 131/74  Pulse: (!) 124  (!) 122 (!) 124  Resp: (!) 24 (!) 26 (!) 26 (!) 25  Temp: 99.9 F (37.7 C)     TempSrc:      SpO2: 93% 91% 100% 100%    Intake/Output Summary (Last 24 hours) at 05/18/2021 1105 Last data filed at 05/18/2021 1056 Gross per 24 hour  Intake 4752.25 ml  Output --  Net 4752.25 ml   There were no vitals filed for this visit.   Exam General: Alert and oriented x 3, NAD,  appears uncomfortable Cardiovascular: S1 S2 auscultated, tachycardia, RRR Respiratory: Bilateral scattered wheezing Gastrointestinal: Soft, mild TTP RUQ and epigastric region, ND  Ext: no pedal edema bilaterally Neuro: no new FND's Skin: No rashes Psych: somewhat anxious and uncomfortable   Data Reviewed:  I have personally reviewed following labs and imaging studies   CBC Lab Results  Component Value Date   WBC 15.1 (H) 05/18/2021   RBC 3.79 (L) 05/18/2021   HGB 10.2 (L) 05/18/2021   HCT 32.3 (L) 05/18/2021   MCV 85.2 05/18/2021   MCH 26.9 05/18/2021   PLT 211 05/18/2021   MCHC 31.6 05/18/2021   RDW 17.4 (H) 05/18/2021   LYMPHSABS 1.8 05/17/2021   MONOABS 1.2 (H) 05/17/2021   EOSABS 0.0 05/17/2021   BASOSABS 0.0 89/78/4784     Last metabolic panel Lab Results  Component Value Date   NA 129 (L) 05/18/2021   K 4.0 05/18/2021   CL 100 05/18/2021   CO2 19 (L) 05/18/2021   BUN 20 05/18/2021   CREATININE 2.04 (H) 05/18/2021   GLUCOSE 158 (H) 05/18/2021   GFRNONAA 29 (L) 05/18/2021   GFRAA 27 (L) 12/24/2019   CALCIUM 7.7 (L) 05/18/2021   PHOS 4.1 12/28/2008   PROT 6.3 (L) 05/18/2021   ALBUMIN 2.0 (L) 05/18/2021   BILITOT 0.5 05/18/2021   ALKPHOS 241 (H) 05/18/2021   AST 68 (H) 05/18/2021   ALT 21 05/18/2021   ANIONGAP 10 05/18/2021    CBG (last 3)  Recent Labs    05/18/21 0416  05/18/21 0555 05/18/21 0853  GLUCAP 85 70 102*      Radiology Studies:I have reviewed the CT abdomen, ultrasound abdomen, chest x-ray  CT ABDOMEN PELVIS WO CONTRAST:   Result Date: 05/17/2021 CLINICAL DATA:  Epigastric and right upper abdominal pain  IMPRESSION: 1. Cholelithiasis without cholecystitis. 2. 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 3. Fibroid uterus. Electronically Signed   By: Randa Ngo M.D.   On: 05/17/2021 22:51   US Abdomen Complete  Result Date: 05/17/2021 CLINICAL DATA:  Right upper quadrant pain EXAM: ABDOMEN ULTRASOUND COMPLETE COMPARISON:  CT 03/21/2021  IMPRESSION: 1. Cholelithiasis without sonographic evidence for acute cholecystitis. 2. Echogenic liver consistent with hepatic steatosis 3. Mild upper pole caliectasis on the right without renal pelvis dilatation. Electronically Signed   By: Donavan Foil M.D.   On: 05/17/2021 19:31   DG Chest Portable 1 View  Result Date: 05/17/2021 CLINICAL DATA:  Abdominal pain.  History of ovarian cysts.  IMPRESSION: Shallow inspiration.  No evidence of active pulmonary disease. Electronically Signed   By: Lucienne Capers M.D.   On: 05/17/2021 19:42       Ani Deoliveira M.D. [facility] 05/18/2021, 11:05 AM  Available via Epic secure chat 7am-7pm After 7 pm, please refer to night coverage provider listed on amion.

## 2021-05-18 NOTE — Progress Notes (Signed)
Recurrent hypoglycemia reported overnight despite d50. Long acting insulin held and d10 gtt initiated. Will continue to trend CBG.

## 2021-05-18 NOTE — Assessment & Plan Note (Signed)
Continue Synthroid °

## 2021-05-18 NOTE — Assessment & Plan Note (Addendum)
-  Patient met severe sepsis criteria at the time of admission with tachycardia, borderline BP, lactic acidosis, acute kidney injury on CKD, lactic acidosis, possibly due to [id number]-61 and biliary colic -Lactic acid 3.9 on admission, trending down.   -Procalcitonin 133.94 on admission, improving, continue IV Rocephin and Flagyl  -Blood cultures positive for bacteroids fragilis, ID following.   - course considered complete prior to discharge

## 2021-05-18 NOTE — Assessment & Plan Note (Addendum)
-   Encourage lifestyle changes Estimated body mass index is 60.24 kg/m as calculated from the following:   Height as of this encounter: 5\' 4"  (1.626 m).   Weight as of this encounter: 159.2 kg.

## 2021-05-18 NOTE — Assessment & Plan Note (Addendum)
-   Home regimen resumed at discharge 

## 2021-05-18 NOTE — Assessment & Plan Note (Addendum)
-   Likely worsened due to severe sepsis, COVID-19, dehydration. -Patient received IV fluid hydration

## 2021-05-18 NOTE — Assessment & Plan Note (Addendum)
-   Presented with sepsis, tachycardia, borderline BP, lactic acidosis, febrile, COVID-19 positive -Completed remdesivir, sats 97% on room air -Wheezing improved, prednisone has been tapered off

## 2021-05-18 NOTE — ED Notes (Signed)
Pt placed on 2L [place]. O2 sat 87% on RA.

## 2021-05-18 NOTE — Hospital Course (Addendum)
Makayla [name] is a 56 year old female with diabetes mellitus, CKD stage IIIb, obesity, hypothyroidism, hypertension, hyperlipidemia presented with abdominal pain.  Patient reported that she started having abdominal pain on the morning of admission after eating some ribs. No nausea vomiting or diarrhea.  Also reported mild shortness of breath, has history of chronic bronchitis.  No fevers or chills or constipation  In ED, temp 100 F, pulse 119, BP 114/74, respiratory rate 28-30, O2 sats 97 to 100% on room air.  Sodium 132, creatinine 1.96, lactic acidosis 3.9, procalcitonin 133.94.   COVID-19 positive.  Patient was admitted with sepsis secondary to COVID-19.  Abdominal pain due to unclear etiology.  Blood cultures positive for Bacteroides fragilis.  See A&P.

## 2021-05-18 NOTE — Assessment & Plan Note (Addendum)
-   Steroids discontinued prior to discharge - Home regimen resumed - A1c 7.1%

## 2021-05-18 NOTE — Assessment & Plan Note (Signed)
Continue statin. 

## 2021-05-18 NOTE — Assessment & Plan Note (Addendum)
-   Wheezing resolved.  Completed steroid course  - resume home regimen

## 2021-05-19 ENCOUNTER — Encounter (HOSPITAL_COMMUNITY): Payer: Self-pay | Admitting: Internal Medicine

## 2021-05-19 ENCOUNTER — Other Ambulatory Visit: Payer: Self-pay

## 2021-05-19 LAB — BASIC METABOLIC PANEL
Anion gap: 9 (ref 5–15)
BUN: 25 mg/dL — ABNORMAL HIGH (ref 6–20)
CO2: 19 mmol/L — ABNORMAL LOW (ref 22–32)
Calcium: 7.6 mg/dL — ABNORMAL LOW (ref 8.9–10.3)
Chloride: 101 mmol/L (ref 98–111)
Creatinine, Ser: 1.9 mg/dL — ABNORMAL HIGH (ref 0.44–1.00)
GFR, Estimated: 31 mL/min — ABNORMAL LOW (ref >60)
Glucose, Bld: 111 mg/dL — ABNORMAL HIGH (ref 70–99)
Potassium: 4.5 mmol/L (ref 3.5–5.1)
Sodium: 129 mmol/L — ABNORMAL LOW (ref 135–145)

## 2021-05-19 LAB — BLOOD CULTURE ID PANEL (REFLEXED) - BCID2

## 2021-05-19 LAB — HEMOGLOBIN A1C
Hgb A1c MFr Bld: 7.1 % — ABNORMAL HIGH (ref 4.8–5.6)
Mean Plasma Glucose: 157.07 mg/dL

## 2021-05-19 LAB — CBG MONITORING, ED: Glucose-Capillary: 143 mg/dL — ABNORMAL HIGH (ref 70–99)

## 2021-05-19 LAB — C-REACTIVE PROTEIN: CRP: 41 mg/dL — ABNORMAL HIGH (ref <1.0)

## 2021-05-19 LAB — GLUCOSE, CAPILLARY
Glucose-Capillary: 231 mg/dL — ABNORMAL HIGH (ref 70–99)
Glucose-Capillary: 281 mg/dL — ABNORMAL HIGH (ref 70–99)

## 2021-05-19 LAB — PROCALCITONIN: Procalcitonin: 66.38 ng/mL

## 2021-05-19 MED ORDER — BUDESONIDE 0.25 MG/2ML IN SUSP
0.2500 mg | Freq: Two times a day (BID) | RESPIRATORY_TRACT | Status: DC
  Administered 2021-05-19 – 2021-05-25 (×13): 0.25 mg via RESPIRATORY_TRACT
  Filled 2021-05-19 (×13): qty 2

## 2021-05-19 MED ORDER — ARFORMOTEROL TARTRATE 15 MCG/2ML IN NEBU
15.0000 ug | INHALATION_SOLUTION | Freq: Two times a day (BID) | RESPIRATORY_TRACT | Status: DC
  Administered 2021-05-19 – 2021-05-25 (×13): 15 ug via RESPIRATORY_TRACT
  Filled 2021-05-19 (×13): qty 2

## 2021-05-19 MED ORDER — IPRATROPIUM-ALBUTEROL 0.5-2.5 (3) MG/3ML IN SOLN
3.0000 mL | Freq: Four times a day (QID) | RESPIRATORY_TRACT | Status: DC
  Administered 2021-05-19 (×3): 3 mL via RESPIRATORY_TRACT
  Filled 2021-05-19 (×3): qty 3

## 2021-05-19 NOTE — Progress Notes (Addendum)
PHARMACY - PHYSICIAN COMMUNICATION CRITICAL VALUE ALERT - BLOOD CULTURE IDENTIFICATION (BCID)  Makayla [name] is an 56 y.o. female who presented to [facility] on 05/17/2021 with a chief complaint of abd pain  Assessment:  1/4 bottles anaerobic, GNR =  Bacteroides fragilis  Name of physician (or Provider) Contacted: R. Rai via Secure chat  Current antibiotics: ceftriaxone & metronidazole  Changes to prescribed antibiotics recommended:  Patient is on recommended antibiotics - No changes needed  Results for orders placed or performed during the hospital encounter of 05/17/21  Blood Culture ID Panel (Reflexed) (Collected: 05/17/2021  4:50 PM)  Result Value Ref Range   Enterococcus faecalis NOT DETECTED NOT DETECTED   Enterococcus Faecium NOT DETECTED NOT DETECTED   Listeria monocytogenes NOT DETECTED NOT DETECTED   Staphylococcus species NOT DETECTED NOT DETECTED   Staphylococcus aureus (BCID) NOT DETECTED NOT DETECTED   Staphylococcus epidermidis NOT DETECTED NOT DETECTED   Staphylococcus lugdunensis NOT DETECTED NOT DETECTED   Streptococcus species NOT DETECTED NOT DETECTED   Streptococcus agalactiae NOT DETECTED NOT DETECTED   Streptococcus pneumoniae NOT DETECTED NOT DETECTED   Streptococcus pyogenes NOT DETECTED NOT DETECTED   A.calcoaceticus-baumannii NOT DETECTED NOT DETECTED   Bacteroides fragilis DETECTED (A) NOT DETECTED   Enterobacterales NOT DETECTED NOT DETECTED   Enterobacter cloacae complex NOT DETECTED NOT DETECTED   Escherichia coli NOT DETECTED NOT DETECTED   Klebsiella aerogenes NOT DETECTED NOT DETECTED   Klebsiella oxytoca NOT DETECTED NOT DETECTED   Klebsiella pneumoniae NOT DETECTED NOT DETECTED   Proteus species NOT DETECTED NOT DETECTED   Salmonella species NOT DETECTED NOT DETECTED   Serratia marcescens NOT DETECTED NOT DETECTED   Haemophilus influenzae NOT DETECTED NOT DETECTED   Neisseria meningitidis NOT DETECTED NOT DETECTED   Pseudomonas aeruginosa NOT  DETECTED NOT DETECTED   Stenotrophomonas maltophilia NOT DETECTED NOT DETECTED   Candida albicans NOT DETECTED NOT DETECTED   Candida auris NOT DETECTED NOT DETECTED   Candida glabrata NOT DETECTED NOT DETECTED   Candida krusei NOT DETECTED NOT DETECTED   Candida parapsilosis NOT DETECTED NOT DETECTED   Candida tropicalis NOT DETECTED NOT DETECTED   Cryptococcus neoformans/gattii NOT DETECTED NOT DETECTED    Eudelia Bunch, Pharm.D 05/19/2021 6:28 AM

## 2021-05-19 NOTE — Plan of Care (Signed)
Dicussed POC with patient upon admission. Verbalizes understanding.

## 2021-05-19 NOTE — Progress Notes (Signed)
[facility]                                                                               Makayla [name], is a 56 y.o. female, DOB - [birth year], NVK:[id number] Admit date - 05/17/2021    Outpatient Primary MD for the patient is Loura Back, NP  LOS - 1  days    Brief summary   Patient is a 56 year old female with diabetes mellitus, CKD stage IIIb, obesity, hypothyroidism, hypertension, hyperlipidemia presented with abdominal pain.  Patient reported that she started having abdominal pain on the morning of admission after eating some ribs, severe, right upper quadrant.  No nausea vomiting or diarrhea.  Also reported mild shortness of breath, has history of chronic bronchitis.  No fevers or chills or constipation  In ED, temp 100 F, pulse 119, BP 114/74, respiratory rate 28-30, O2 sats 97 to 100% on room air Sodium 132, creatinine 1.96, lactic acidosis 3.9, procalcitonin 133.94.  COVID-19 positive.  Patient was admitted with sepsis secondary to COVID-19.  Abdominal pain due to unclear etiology.   Assessment & Plan    Assessment and Plan: * Sepsis (HCC)- (present on admission) - Patient met severe sepsis criteria at the time of admission with tachycardia, borderline BP, lactic acidosis, acute kidney injury on CKD, lactic acidosis, possibly due to COVID-19 however abdominal pain etiology has not been ascertained yet. -Lactic acid 3.9 on admission, trending down.   -Procalcitonin 133.94 on admission, improving, continue IV Rocephin and Flagyl   Abdominal pain - Unclear etiology, CT abdomen showed cholelithiasis without cholecystitis, no pancreatitis or diverticulitis/colitis on CT.  Lipase 24.  No ovarian torsion seen on CT abdomen. -Abdominal ultrasound showed cholelithiasis without sonographic evidence for acute cholecystitis. -Abdominal pain improving - blood cultures however showed bacteroids fragilis, continue IV Rocephin, Flagyl   COVID-19 virus infection - Presented  with sepsis, tachycardia, borderline BP, lactic acidosis, febrile, COVID-19 positive -O2 sats 95 to 97% on 2 L, continue IV Solu-Medrol, diffusely wheezing.  -Hold IV fluids.  Continue IV remdesivir  Chronic bronchitis (HCC) with acute exacerbation - Diffusely wheezing bilaterally, continue IV steroids, scheduled nebs, -Added Pulmicort, Brovana.  Hold dulera   Acute renal failure superimposed on stage 3b chronic kidney disease (HCC) - Likely worsened due to severe sepsis, COVID-19, dehydration -Creatinine 2.0 on 2/9, improving, 1.9 today  -Baseline creatinine around 1.3-1.5 -Saline lock IV fluids  Type 2 diabetes mellitus with chronic kidney disease, with long-term current use of insulin (HCC) -CBGs now trending up slightly due to steroids was hypoglycemic on admission -Continue sliding scale insulin  Hypertension associated with diabetes (HCC)- (present on admission) - BP stable, hold IV fluids, diffusely wheezing  Hypothyroidism- (present on admission) - Continue Synthroid  Obesity, morbid (HCC)- (present on admission) - Encourage lifestyle changes  Hyperlipidemia associated with type 2 diabetes mellitus (HCC)- (present on admission) - Continue statin    Code Status: Full CODE STATUS DVT Prophylaxis:  enoxaparin (LOVENOX) injection 40 mg Start: 05/17/21 2200   Level of Care: Level of care: Progressive Family Communication:   Disposition Plan:     Remains inpatient appropriate: Diffusely wheezing, blood cultures positive.  COVID-19 positive on treatment  Procedures:    Consultants:   None  Antimicrobials:   Anti-infectives (From admission, onward)    Start     Dose/Rate Route Frequency Ordered Stop   05/18/21 1000  remdesivir 100 mg in sodium chloride 0.9 % 100 mL IVPB       See Hyperspace for full Linked Orders Report.   100 mg 200 mL/hr over 30 Minutes Intravenous Daily 05/17/21 2008 05/22/21 0959   05/18/21 0600  metroNIDAZOLE (FLAGYL) IVPB 500 mg         500 mg 100 mL/hr over 60 Minutes Intravenous Every 12 hours 05/17/21 2110     05/17/21 2100  remdesivir 200 mg in sodium chloride 0.9% 250 mL IVPB       See Hyperspace for full Linked Orders Report.   200 mg 580 mL/hr over 30 Minutes Intravenous Once 05/17/21 2008 05/17/21 2127   05/17/21 1845  cefTRIAXone (ROCEPHIN) 2 g in sodium chloride 0.9 % 100 mL IVPB        2 g 200 mL/hr over 30 Minutes Intravenous Daily-1800 05/17/21 1830     05/17/21 1830  levofloxacin (LEVAQUIN) IVPB 750 mg  Status:  Discontinued        750 mg 100 mL/hr over 90 Minutes Intravenous  Once 05/17/21 1822 05/17/21 1830   05/17/21 1830  metroNIDAZOLE (FLAGYL) IVPB 500 mg        500 mg 100 mL/hr over 60 Minutes Intravenous  Once 05/17/21 1822 05/17/21 2007        Medications  Scheduled Meds:  allopurinol  100 mg Oral Daily   arformoterol  15 mcg Nebulization BID   budesonide (PULMICORT) nebulizer solution  0.25 mg Nebulization BID   enoxaparin (LOVENOX) injection  40 mg Subcutaneous Q24H   insulin aspart  0-5 Units Subcutaneous QHS   insulin aspart  0-9 Units Subcutaneous TID WC   ipratropium-albuterol  3 mL Nebulization Q6H   levothyroxine  50 mcg Oral Q0600   methylPREDNISolone (SOLU-MEDROL) injection  40 mg Intravenous Q12H   multivitamin with minerals  1 tablet Oral Daily   pantoprazole  40 mg Oral Daily   rosuvastatin  10 mg Oral QHS   sodium chloride flush  3 mL Intravenous Q12H   Continuous Infusions:  cefTRIAXone (ROCEPHIN)  IV Stopped (05/18/21 1907)   metronidazole Stopped (05/19/21 0824)   remdesivir 100 mg in NS 100 mL 100 mg (05/19/21 0901)   PRN Meds:.acetaminophen **OR** acetaminophen, albuterol, oxyCODONE-acetaminophen    Subjective:   Makayla [name] was seen and examined today.  Diffusely wheezing, somewhat short of breath, abdominal pain improving.  Now complaining of left upper back pain.  Difficult to obtain a review of system from the patient.  Overnight no acute issues.  No fevers  .  Objective:   Vitals:   05/19/21 0500 05/19/21 0600 05/19/21 0910 05/19/21 0940  BP: 124/81 118/70  132/83  Pulse: (!) 102 98  (!) 105  Resp: $Remo'16 15  19  'wCCPS$ Temp:      TempSrc:      SpO2: 95% 97% 97% 97%  Weight:        Intake/Output Summary (Last 24 hours) at 05/19/2021 1129 Last data filed at 05/19/2021 4917 Gross per 24 hour  Intake 1002.72 ml  Output --  Net 1002.72 ml   Filed Weights   05/18/21 1313  Weight: (!) 152 kg     Exam General: Alert and oriented x 3, appears uncomfortable, vague in responding to questions Cardiovascular: S1 S2 auscultated, RRR Respiratory: Diffuse  wheezing bilaterally Gastrointestinal: Obese, soft, nontender, nondistended, + bowel sounds Ext: no pedal edema bilaterally Neuro: moving all 4 extremities spontaneously Skin: No rashes   Data Reviewed:  I have personally reviewed following labs and imaging studies   CBC Lab Results  Component Value Date   WBC 15.1 (H) 05/18/2021   RBC 3.79 (L) 05/18/2021   HGB 10.2 (L) 05/18/2021   HCT 32.3 (L) 05/18/2021   MCV 85.2 05/18/2021   MCH 26.9 05/18/2021   PLT 211 05/18/2021   MCHC 31.6 05/18/2021   RDW 17.4 (H) 05/18/2021   LYMPHSABS 1.8 05/17/2021   MONOABS 1.2 (H) 05/17/2021   EOSABS 0.0 05/17/2021   BASOSABS 0.0 88/41/6606     Last metabolic panel Lab Results  Component Value Date   NA 129 (L) 05/19/2021   K 4.5 05/19/2021   CL 101 05/19/2021   CO2 19 (L) 05/19/2021   BUN 25 (H) 05/19/2021   CREATININE 1.90 (H) 05/19/2021   GLUCOSE 111 (H) 05/19/2021   GFRNONAA 31 (L) 05/19/2021   GFRAA 27 (L) 12/24/2019   CALCIUM 7.6 (L) 05/19/2021   PHOS 4.1 12/28/2008   PROT 6.3 (L) 05/18/2021   ALBUMIN 2.0 (L) 05/18/2021   BILITOT 0.5 05/18/2021   ALKPHOS 241 (H) 05/18/2021   AST 68 (H) 05/18/2021   ALT 21 05/18/2021   ANIONGAP 9 05/19/2021    CBG (last 3)  Recent Labs    05/18/21 1638 05/18/21 2227 05/19/21 0859  GLUCAP 87 100* 143*      Coagulation Profile: No  results for input(s): INR, PROTIME in the last 168 hours.   Radiology Studies: CT ABDOMEN PELVIS WO CONTRAST  Result Date: 05/17/2021 CLINICAL DATA:  Epigastric and right upper abdominal pain EXAM: CT ABDOMEN AND PELVIS WITHOUT CONTRAST TECHNIQUE: Multidetector CT imaging of the abdomen and pelvis was performed following the standard protocol without IV contrast. RADIATION DOSE REDUCTION: This exam was performed according to the departmental dose-optimization program which includes automated exposure control, adjustment of the mA and/or kV according to patient size and/or use of iterative reconstruction technique. COMPARISON:  05/17/2021, 03/21/2021 FINDINGS: Lower chest: Scattered hypoventilatory changes at the lung bases. No acute pleural or parenchymal lung disease. Hepatobiliary: Calcified gallstones layer dependently in the gallbladder. No evidence of acute cholecystitis. Unremarkable unenhanced appearance of the liver. Pancreas: Unremarkable unenhanced appearance. Spleen: Unremarkable unenhanced appearance. Adrenals/Urinary Tract: No urinary tract calculi or obstructive uropathy within either kidney. Adrenals are unremarkable. Bladder is decompressed, limiting its evaluation. Stomach/Bowel: No bowel obstruction or ileus. No bowel wall thickening or inflammatory change. Vascular/Lymphatic: No significant vascular findings are present. No enlarged abdominal or pelvic lymph nodes. Reproductive: Fat containing left ovarian mass measuring up to 13.3 x 9.2 cm, consistent with dermoid. Right ovary is unremarkable. Stable uterine calcifications consistent with fibroids. Other: No free fluid or free gas.  No abdominal wall hernia. Musculoskeletal: No acute or destructive bony lesions. Reconstructed images demonstrate no additional findings. IMPRESSION: 1. Cholelithiasis without cholecystitis. 2. 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 3. Fibroid uterus. Electronically  Signed   By: Randa Ngo M.D.   On: 05/17/2021 22:51   US Abdomen Complete  Result Date: 05/17/2021 CLINICAL DATA:  Right upper quadrant pain EXAM: ABDOMEN ULTRASOUND COMPLETE COMPARISON:  CT 03/21/2021 FINDINGS: Gallbladder: Multiple small shadowing stones and gallbladder sludge. Normal wall thickness. Negative sonographic Murphy. Common bile duct: Diameter: 6 mm Liver: Diffusely echogenic. No focal hepatic abnormality. Portal vein is patent on color Doppler imaging  with normal direction of blood flow towards the liver. IVC: No abnormality visualized. Pancreas: Visualized portion unremarkable. Spleen: Size and appearance within normal limits. Right Kidney: Length: 9.8 cm. Echogenicity within normal limits. Mild upper pole hydronephrosis but no pelvic dilatation. Left Kidney: Length: 9.8 cm. Echogenicity within normal limits. No mass or hydronephrosis visualized. Abdominal aorta: No aneurysm visualized. Other findings: None. IMPRESSION: 1. Cholelithiasis without sonographic evidence for acute cholecystitis. 2. Echogenic liver consistent with hepatic steatosis 3. Mild upper pole caliectasis on the right without renal pelvis dilatation. Electronically Signed   By: Donavan Foil M.D.   On: 05/17/2021 19:31   DG Chest Portable 1 View  Result Date: 05/17/2021 CLINICAL DATA:  Abdominal pain.  History of ovarian cysts. EXAM: PORTABLE CHEST 1 VIEW COMPARISON:  03/21/2021 FINDINGS: Shallow inspiration. Heart size and pulmonary vascularity are normal. Increased density of the left chest likely is due to overlying soft tissue attenuation and patient rotation. No definite consolidation or effusion. No pneumothorax. IMPRESSION: Shallow inspiration.  No evidence of active pulmonary disease. Electronically Signed   By: Lucienne Capers M.D.   On: 05/17/2021 19:42       Caylynn Minchew M.D. [facility] 05/19/2021, 11:29 AM  Available via Epic secure chat 7am-7pm After 7 pm, please refer to night coverage  provider listed on amion.

## 2021-05-20 ENCOUNTER — Encounter (HOSPITAL_COMMUNITY): Payer: Self-pay | Admitting: Internal Medicine

## 2021-05-20 ENCOUNTER — Ambulatory Visit (HOSPITAL_COMMUNITY): Payer: Medicare Other

## 2021-05-20 DIAGNOSIS — R7881 Bacteremia: Secondary | ICD-10-CM | POA: Diagnosis not present

## 2021-05-20 DIAGNOSIS — U071 COVID-19: Secondary | ICD-10-CM

## 2021-05-20 DIAGNOSIS — L89102 Pressure ulcer of unspecified part of back, stage 2: Secondary | ICD-10-CM | POA: Diagnosis present

## 2021-05-20 DIAGNOSIS — K8050 Calculus of bile duct without cholangitis or cholecystitis without obstruction: Secondary | ICD-10-CM

## 2021-05-20 LAB — BASIC METABOLIC PANEL
Anion gap: 10 (ref 5–15)
BUN: 43 mg/dL — ABNORMAL HIGH (ref 6–20)
CO2: 20 mmol/L — ABNORMAL LOW (ref 22–32)
Calcium: 7.7 mg/dL — ABNORMAL LOW (ref 8.9–10.3)
Chloride: 96 mmol/L — ABNORMAL LOW (ref 98–111)
Creatinine, Ser: 2.2 mg/dL — ABNORMAL HIGH (ref 0.44–1.00)
GFR, Estimated: 26 mL/min — ABNORMAL LOW (ref >60)
Glucose, Bld: 230 mg/dL — ABNORMAL HIGH (ref 70–99)
Potassium: 4.1 mmol/L (ref 3.5–5.1)
Sodium: 126 mmol/L — ABNORMAL LOW (ref 135–145)

## 2021-05-20 LAB — C-REACTIVE PROTEIN: CRP: 30.6 mg/dL — ABNORMAL HIGH (ref <1.0)

## 2021-05-20 LAB — GLUCOSE, CAPILLARY
Glucose-Capillary: 243 mg/dL — ABNORMAL HIGH (ref 70–99)
Glucose-Capillary: 216 mg/dL — ABNORMAL HIGH (ref 70–99)
Glucose-Capillary: 210 mg/dL — ABNORMAL HIGH (ref 70–99)
Glucose-Capillary: 178 mg/dL — ABNORMAL HIGH (ref 70–99)

## 2021-05-20 MED ORDER — INSULIN GLARGINE-YFGN 100 UNIT/ML ~~LOC~~ SOLN
20.0000 [IU] | Freq: Every day | SUBCUTANEOUS | Status: DC
  Administered 2021-05-20: 20 [IU] via SUBCUTANEOUS
  Filled 2021-05-20: qty 0.2

## 2021-05-20 MED ORDER — IPRATROPIUM-ALBUTEROL 0.5-2.5 (3) MG/3ML IN SOLN
3.0000 mL | Freq: Three times a day (TID) | RESPIRATORY_TRACT | Status: DC
  Administered 2021-05-20: 3 mL via RESPIRATORY_TRACT
  Filled 2021-05-20: qty 3

## 2021-05-20 MED ORDER — IPRATROPIUM-ALBUTEROL 0.5-2.5 (3) MG/3ML IN SOLN
3.0000 mL | Freq: Two times a day (BID) | RESPIRATORY_TRACT | Status: DC
  Administered 2021-05-20 – 2021-05-25 (×10): 3 mL via RESPIRATORY_TRACT
  Filled 2021-05-20 (×12): qty 3

## 2021-05-20 MED ORDER — INSULIN ASPART 100 UNIT/ML IJ SOLN
3.0000 [IU] | Freq: Three times a day (TID) | INTRAMUSCULAR | Status: DC
  Administered 2021-05-20 (×3): 3 [IU] via SUBCUTANEOUS

## 2021-05-20 MED ORDER — INSULIN ASPART 100 UNIT/ML IJ SOLN
0.0000 [IU] | Freq: Three times a day (TID) | INTRAMUSCULAR | Status: DC
  Administered 2021-05-20 – 2021-05-21 (×4): 5 [IU] via SUBCUTANEOUS
  Administered 2021-05-21 (×2): 8 [IU] via SUBCUTANEOUS
  Administered 2021-05-22 (×2): 15 [IU] via SUBCUTANEOUS

## 2021-05-20 MED ORDER — SODIUM CHLORIDE 0.9 % IV BOLUS
1500.0000 mL | Freq: Once | INTRAVENOUS | Status: AC
  Administered 2021-05-20: 1500 mL via INTRAVENOUS

## 2021-05-20 MED ORDER — SODIUM BICARBONATE 8.4 % IV SOLN
INTRAVENOUS | Status: DC
  Filled 2021-05-20: qty 150
  Filled 2021-05-20: qty 1000
  Filled 2021-05-20 (×2): qty 150

## 2021-05-20 MED ORDER — ENOXAPARIN SODIUM 80 MG/0.8ML IJ SOSY
0.5000 mg/kg | PREFILLED_SYRINGE | INTRAMUSCULAR | Status: DC
  Administered 2021-05-20 – 2021-05-24 (×5): 80 mg via SUBCUTANEOUS
  Filled 2021-05-20 (×5): qty 0.8

## 2021-05-20 NOTE — Consult Note (Signed)
Renal Service Consult Note [facility]  Terril Amaro 05/20/2021 Sol Blazing, MD Requesting Physician: Dr. Tana Coast  Reason for Consult: Renal failure HPI: The patient is a 56 y.o. year-old w/ hx of chronic bronchitis, CKD III, DM, HL, HTN, cellulitis, neuropathy presented to ED on 2/08 for abd pain, hx ovarian cysts. In ED temp 100 deg, hR 110s- 120s, RR 20-30s, SBP 100- 130 range. Labs showed creat 1.96, WBC 16K, Hb 11.7. Lactic acid up. COVID +. CXR neg, [place] w/ gallstones but not infection. Pt rec'd IV rocephin and flagyl, and remdesivir was started. Got 3 L IVF"s and IVF"s at 150 cc /hr.  Renal function hasn't improved w/ creat 2.2 today, no uOP recorded until today 350 cc.  Asked to see for renal failure.   Pt seen in room, asking for help getting dinner/ food.  States she is voiding now but wasn't for a few days. Doesn't want a foley cath, wants to get up if she can. No abd pain or flank pain.   ROS - denies CP, no joint pain, no HA, no blurry vision, no rash, no diarrhea, no nausea/ vomiting, no dysuria, no difficulty voiding   Past Medical History  Past Medical History:  Diagnosis Date   Abnormal uterine bleeding    Cellulitis    Chronic bronchitis (HCC)    CKD (chronic kidney disease), stage III ([place])    Diabetes mellitus    Diabetes mellitus without complication ([place]) 08/09/7739   Qualifier: Diagnosis of  By: Doy Mince LPN, Megan     Hyperlipidemia    Hypertension    Hypokalemia 12/08/2012   Neuropathy    Past Surgical History  Past Surgical History:  Procedure Laterality Date   TONSILLECTOMY     Family History  Family History  Problem Relation Age of Onset   Diabetes Mother    Hypertension Mother    Hyperlipidemia Mother    Diabetes Father    Heart disease Father    Diabetes Sister    Social History  reports that she has quit smoking. Her smoking use included cigarettes. She has a 2.30 pack-year smoking history. She has never used smokeless tobacco. She  reports that she does not currently use alcohol. She reports that she does not use drugs. Allergies  Allergies  Allergen Reactions   Penicillins Shortness Of Breath    Inflates bronchitis Tolerates Keflex, Rocephin    Ibuprofen Other (See Comments)    Can not take due to kidney problems.    Peach Flavor Swelling    Peaches.    Strawberry Extract Swelling   Home medications Prior to Admission medications   Medication Sig Start Date End Date Taking? Authorizing Provider  acetaminophen (TYLENOL) 325 MG tablet Take 650 mg by mouth every 6 (six) hours as needed for moderate pain.   Yes [provider]  albuterol (PROVENTIL HFA;VENTOLIN HFA) 108 (90 BASE) MCG/ACT inhaler Inhale 2 puffs into the lungs every 6 (six) hours as needed for wheezing or shortness of breath. For shortness of breath   Yes [provider]  allopurinol (ZYLOPRIM) 100 MG tablet Take 100 mg by mouth 2 (two) times daily. 04/11/21  Yes [provider]  amLODipine (NORVASC) 5 MG tablet Take 1 tablet by mouth daily. 03/24/21 06/22/21 Yes Hall, Carole N, DO  furosemide (LASIX) 20 MG tablet Take 20 mg by mouth daily. 04/11/21  Yes [provider]  glipiZIDE (GLUCOTROL) 5 MG tablet Take 5 mg by mouth 2 (two) times daily before  a meal.   Yes [provider]  levothyroxine (SYNTHROID) 50 MCG tablet Take 50 mcg by mouth daily before breakfast. 04/11/21  Yes [provider]  MOUNJARO 2.5 MG/0.5ML Pen Inject 0.5 mLs into the skin once a week. Monday 05/11/21  Yes [provider]  omeprazole (PRILOSEC) 20 MG capsule Take 20 mg by mouth daily before breakfast.   Yes [provider]  psyllium (METAMUCIL) 58.6 % packet Take 1 packet by mouth daily as needed (diarrhea).   Yes [provider]  rosuvastatin (CRESTOR) 10 MG tablet Take 10 mg by mouth at bedtime. 04/11/21  Yes [provider]  SYMBICORT 160-4.5 MCG/ACT inhaler Inhale 2 puffs into the lungs 2 (two)  times daily. 10/02/17  Yes [provider]  TRESIBA FLEXTOUCH 200 UNIT/ML FlexTouch Pen Inject 56 Units into the skin in the morning and at bedtime. 04/07/21  Yes [provider]  B-D UF III MINI PEN NEEDLES 31G X 5 MM MISC See admin instructions. 04/28/15   [provider]  cyclobenzaprine (FLEXERIL) 5 MG tablet Take 1 tablet twice a day as needed for Muscle spasms. 01/31/15   Elayne Snare, MD  glucose blood (ONETOUCH VERIO) test strip Use as instructed to check blood sugar 2 times daily Dx code E11.9 11/03/14   Elayne Snare, MD  Insulin Syringe-Needle U-100 (SAFETY-GLIDE 0.3CC SYR 29GX1/2) 29G X 1/2" 0.3 ML MISC Use as directed. 12/11/12   Hongalgi, Lenis Dickinson, MD  Multiple Vitamin (MULTIVITAMIN WITH MINERALS) TABS tablet Take 1 tablet by mouth daily. Patient not taking: Reported on 05/18/2021 04/20/21   Swayze, Ava, DO     Vitals:   05/20/21 5277 05/20/21 0813 05/20/21 0816 05/20/21 1111  BP: 102/68   (!) 117/59  Pulse: 92   95  Resp: 20   19  Temp: 97.6 F (36.4 C)   97.6 F (36.4 C)  TempSrc: Oral   Oral  SpO2: 100% 100% 100% 99%  Weight:      Height:       Exam Gen alert, markedly obese, no distress, a little restless No rash, cyanosis or gangrene Sclera anicteric, throat slightly dry No jvd or bruits Chest clear bilat to bases, some exp wheezing RRR no RG Abd soft obese w/ marked pannus ntnd no mass or ascites +bs GU defer MS no joint effusions or deformity Ext no pitting LE or UE edema, no wounds or ulcers Neuro is alert, Ox 3 , nf     Home meds include - allopurinol, norvasc 5, lasix 20, glipizdie, synthroid, prilosec, crestor 10, symbicort, tresiba insulin , felxeril, MVI, prns/ vits/ supps         Date   Creat  eGFR   2009- 2017  1.42- 2.00   2019   2.28   2021   2.34   Dec 13-16, 2022 1.85- 2.98 18- 31 ml/min, stage IV   Apr 04, 2021  1.39  45   Jan 4- 10, 2023 1.05- 1.54 44- >60 ml/min     Feb 8   1.96  30   Feb 9   2.04   Feb 10  1.90    May 20, 2021  2.20  26       CXR 2/8 - IMPRESSION: Shallow inspiration.  No evidence of active pulmonary disease.      Abd CT w/o contrast 05/17/21 - Adrenals/Urinary Tract: No urinary tract calculi or obstructive uropathy within either kidney. Adrenals are unremarkable. Bladder is decompressed, limiting its evaluation  Renal US 2/8 - R 9.8 cm, echogenicity within normal limits, mild upper pole hydronephrosis but no pelvic dilatation. L 9.8 cm, echogenicity within normal limits, no mass or hydronephrosis visualized     UA 2/8 - clear, 100 prot, 1.024, 0-5 rbc/ wbc   Assessment/ Plan: AKI on CKD 3a - b/l creatinine from Jan 4-10 was 1.05- 1.54, eGFR 44- 60 ml/min.  In past years her renal function was worse w/ stage IV disease in Dec 2022. On admission here creat was 1.96 in setting of abdominal pain, poor po intake and acute COVID infection. No other abdominal infection noted w/ imaging (abd [place], CT). No renal obstruction by [place]. UA is negative. BP's have been normal to low-normal, but no shock. On exam she still appears a bit dry.  Suspect AKI is due to borderline hypotension from sepsis/ infection and volume depletion. Will rebolus 1500cc NS and resume IVF"s w/ bicarb gtt at 125 cc/hr. We should try to track UOP using Brandon cath, it not working consider foley cath. Will follow.  Hyponatremia - get urine Na, and osm. F/u bmet in am.  Bacteroides fragilis bacteremia - with +LG fevers and WBC was 15K. Blood cx's were + for bacteroides fragilis. She is getting Rocephin/ flagyl  COVID + infection - getting remdesivir, steroids Chronic bronchitis - nebs, etc DM2 - per pmd HTN - bp's soft , holding BP lowering meds Super morbid obesity      Rob   MD 05/20/2021, 5:48 PM  Recent Labs  Lab 05/17/21 1650 05/18/21 0500  WBC 16.2* 15.1*  HGB 11.7* 10.2*   Recent Labs  Lab 05/17/21 1650 05/18/21 0500 05/19/21 0401 05/20/21 0353  K 4.3 4.0 4.5 4.1  BUN 20 20 25* 43*  CREATININE  1.96* 2.04* 1.90* 2.20*  ALBUMIN 2.5* 2.0*  --   --   CALCIUM 8.5* 7.7* 7.6* 7.7*

## 2021-05-20 NOTE — Assessment & Plan Note (Signed)
-   Present on admission -Wound care per nursing

## 2021-05-20 NOTE — Plan of Care (Signed)
?  Problem: Clinical Measurements: ?Goal: Will remain free from infection ?Outcome: Progressing ?Goal: Diagnostic test results will improve ?Outcome: Progressing ?Goal: Respiratory complications will improve ?Outcome: Progressing ?  ?

## 2021-05-20 NOTE — Progress Notes (Signed)
[facility]                                                                               Makayla [name], is a 56 y.o. female, DOB - 11/11/1965, ZOX:[id number] Admit date - 05/17/2021    Outpatient Primary MD for the patient is Makayla Holms, NP  LOS - 2  days    Brief summary   Patient is a 56 year old female with diabetes mellitus, CKD stage IIIb, obesity, hypothyroidism, hypertension, hyperlipidemia presented with abdominal pain.  Patient reported that she started having abdominal pain on the morning of admission after eating some ribs, severe, right upper quadrant.  No nausea vomiting or diarrhea.  Also reported mild shortness of breath, has history of chronic bronchitis.  No fevers or chills or constipation  In ED, temp 100 F, pulse 119, BP 114/74, respiratory rate 28-30, O2 sats 97 to 100% on room air Sodium 132, creatinine 1.96, lactic acidosis 3.9, procalcitonin 133.94.  COVID-19 positive.  Patient was admitted with sepsis secondary to COVID-19.  Abdominal pain due to unclear etiology.  Blood cultures positive for Bacteroides fragilis   Assessment & Plan    Assessment and Plan: * Sepsis ([place]) with bacteremia- (present on admission) - Patient met severe sepsis criteria at the time of admission with tachycardia, borderline BP, lactic acidosis, acute kidney injury on CKD, lactic acidosis, possibly due to COVID-19 however abdominal pain etiology has not been ascertained yet. -Lactic acid 3.9 on admission, trending down.   -Procalcitonin 133.94 on admission, improving, continue IV Rocephin and Flagyl  -Blood cultures positive for bacteroids fragilis, ID consulted  Abdominal pain - Unclear etiology, CT abdomen showed cholelithiasis without cholecystitis, no pancreatitis or diverticulitis/colitis on CT.  Lipase 24.  No ovarian torsion seen on CT abdomen. -Abdominal ultrasound showed cholelithiasis without sonographic evidence for acute cholecystitis. -Currently no  abdominal pain, tolerating diet without any difficulty - blood cultures however showed bacteroids fragilis, continue IV Rocephin, Flagyl.  Will consult with ID   COVID-19 virus infection- (present on admission) - Presented with sepsis, tachycardia, borderline BP, lactic acidosis, febrile, COVID-19 positive - Sats 99 to 100% on 2 L, wean O2 as tolerated  - Wheezing is improving, currently on IV Solu-Medrol - Continue IV remdesivir -Increase mobility, OOB, PT  Chronic bronchitis (HCC) with acute exacerbation - Wheezing is now improving, continue steroids, scheduled nebs, Brovana, Pulmicort -Continue flutter valve   Acute renal failure superimposed on stage 3b chronic kidney disease ([place]) - Likely worsened due to severe sepsis, COVID-19, dehydration -Creatinine now worsening again 2.2, sodium trending down 126 -Follow serum osmolarity, urine osmolarity, UNa, renal consulted -Net +6.3 L, IV fluids discontinued yesterday, on Lasix 20 mg daily outpatient  Type 2 diabetes mellitus with chronic kidney disease, with long-term current use of insulin (HCC) -CBGs now elevated secondary to steroids, was hypoglycemic on admission -Added NovoLog 3 units 3 times daily AC, Semglee 20 units at bedtime, moderate sliding scale insulin  Hypertension associated with diabetes ([place])- (present on admission) - BP soft, continue to hold antihypertensives  Pressure injury of back, stage 2 ([place])- (present on admission) - Present on admission -Wound care per nursing  Hypothyroidism- (present on admission) -  Continue Synthroid  Obesity, morbid ([place])- (present on admission) - Encourage lifestyle changes Estimated body mass index is 60.24 kg/m as calculated from the following:   Height as of this encounter: $RemoveBeforeD'5\' 4"'[id number]$  (1.626 m).   Weight as of this encounter: 159.2 kg.  Hyperlipidemia associated with type 2 diabetes mellitus ([place])- (present on admission) - Continue statin   Code Status: Full CODE  STATUS DVT Prophylaxis:  enoxaparin (LOVENOX) injection 40 mg Start: 05/17/21 2200   Level of Care: Level of care: Progressive Family Communication:  Disposition Plan:     Remains inpatient appropriate: Blood cultures positive, on IV remdesivir  Procedures:    Consultants:   Nephrology Infectious disease  Antimicrobials:  IV remdesivir 05/17/2021--> IV metronidazole 05/17/2021 -> IV Rocephin 05/17/2021 - >   Medications  Scheduled Meds:  allopurinol  100 mg Oral Daily   arformoterol  15 mcg Nebulization BID   budesonide (PULMICORT) nebulizer solution  0.25 mg Nebulization BID   enoxaparin (LOVENOX) injection  40 mg Subcutaneous Q24H   insulin aspart  0-15 Units Subcutaneous TID WC   insulin aspart  0-5 Units Subcutaneous QHS   insulin aspart  3 Units Subcutaneous TID WC   insulin glargine-yfgn  20 Units Subcutaneous QHS   ipratropium-albuterol  3 mL Nebulization BID   levothyroxine  50 mcg Oral Q0600   methylPREDNISolone (SOLU-MEDROL) injection  40 mg Intravenous Q12H   multivitamin with minerals  1 tablet Oral Daily   pantoprazole  40 mg Oral Daily   rosuvastatin  10 mg Oral QHS   sodium chloride flush  3 mL Intravenous Q12H   Continuous Infusions:  cefTRIAXone (ROCEPHIN)  IV 2 g (05/19/21 1811)   metronidazole 500 mg (05/20/21 5885)   remdesivir 100 mg in NS 100 mL 100 mg (05/20/21 1040)   PRN Meds:.acetaminophen **OR** acetaminophen, albuterol, oxyCODONE-acetaminophen    Subjective:   Makayla [name] was seen and examined today.  Vague historian, difficult to obtain review of system from the patient.  She states she had a rough night, but no ongoing nausea vomiting or any diarrhea.  Tolerating diet without any difficulty.  Does not appear to have any abdominal pain.  No fevers.  Wheezing still but improving.    Objective:   Vitals:   05/20/21 0629 05/20/21 0813 05/20/21 0816 05/20/21 1111  BP: 102/68   (!) 117/59  Pulse: 92   95  Resp: 20   19  Temp: 97.6 F (36.4  C)   97.6 F (36.4 C)  TempSrc: Oral   Oral  SpO2: 100% 100% 100% 99%  Weight:      Height:        Intake/Output Summary (Last 24 hours) at 05/20/2021 1206 Last data filed at 05/20/2021 0847 Gross per 24 hour  Intake 924.36 ml  Output 350 ml  Net 574.36 ml   Filed Weights   05/18/21 1313 05/19/21 1331 05/20/21 0624  Weight: (!) 152 kg (!) 158.6 kg (!) 159.2 kg     Exam General: Alert and oriented x 3, NAD, poor historian Cardiovascular: S1 S2 auscultated,RRR Respiratory: Bilateral scattered wheezing, improving Gastrointestinal: Morbidly obese, soft, nontender, nondistended, + NBS Ext: no pedal edema bilaterally Neuro: moving all 4 extremities bilaterally  Data Reviewed:  I have personally reviewed following labs and imaging studies   CBC Lab Results  Component Value Date   WBC 15.1 (H) 05/18/2021   RBC 3.79 (L) 05/18/2021   HGB 10.2 (L) 05/18/2021   HCT 32.3 (L) 05/18/2021   MCV 85.2  05/18/2021   MCH 26.9 05/18/2021   PLT 211 05/18/2021   MCHC 31.6 05/18/2021   RDW 17.4 (H) 05/18/2021   LYMPHSABS 1.8 05/17/2021   MONOABS 1.2 (H) 05/17/2021   EOSABS 0.0 05/17/2021   BASOSABS 0.0 29/57/4734     Last metabolic panel Lab Results  Component Value Date   NA 126 (L) 05/20/2021   K 4.1 05/20/2021   CL 96 (L) 05/20/2021   CO2 20 (L) 05/20/2021   BUN 43 (H) 05/20/2021   CREATININE 2.20 (H) 05/20/2021   GLUCOSE 230 (H) 05/20/2021   GFRNONAA 26 (L) 05/20/2021   GFRAA 27 (L) 12/24/2019   CALCIUM 7.7 (L) 05/20/2021   PHOS 4.1 12/28/2008   PROT 6.3 (L) 05/18/2021   ALBUMIN 2.0 (L) 05/18/2021   BILITOT 0.5 05/18/2021   ALKPHOS 241 (H) 05/18/2021   AST 68 (H) 05/18/2021   ALT 21 05/18/2021   ANIONGAP 10 05/20/2021    CBG (last 3)  Recent Labs    05/19/21 1956 05/20/21 0806 05/20/21 1105  GLUCAP 281* 243* 216*      Coagulation Profile: No results for input(s): INR, PROTIME in the last 168 hours.   Radiology Studies: No results  found.     Estill Cotta M.D. [facility] 05/20/2021, 12:06 PM  Available via Epic secure chat 7am-7pm After 7 pm, please refer to night coverage provider listed on amion.

## 2021-05-20 NOTE — Consult Note (Addendum)
[facility]                                                                                        Patient Identification: Patient Name: Makayla [name] MRN: [id number] [place] Date: 05/17/2021  3:11 PM Today's Date: 05/20/2021 Reason for consult: B fragilis bacteremia Requesting provider: Ripudeep Rai  Principal Problem:   Sepsis ([place]) with bacteremia Active Problems:   Hyperlipidemia associated with type 2 diabetes mellitus ([place])   Hypertension associated with diabetes ([place])   Obesity, morbid ([place])   Type 2 diabetes mellitus with chronic kidney disease, with long-term current use of insulin ([place])   Hypothyroidism   Acute renal failure superimposed on stage 3b chronic kidney disease ([place])   COVID-19 virus infection   Abdominal pain   Chronic bronchitis ([place]) with acute exacerbation   Antibiotics: ceftriaxone 2/8-                    Metronidazole 2/8-  Lines/Hardware:  Assessment 56 year old female with morbid obesity, DM, CKD, hypothyroidism, hypertension, hyperlipidemia, ovarian mass who presented to the ED on 2/8 for abdominal pain.  # Biliary colic with no evidence of acute cholecystitis on imaging- tolerating PO.     B fragilis bacteremia likely GI source   # Acute on chronic respiratory failure/COVID 19 positive - on 2-3l oxygen via [place]  - On IV remdesevir and IV solumedrol  # Stage 2 DU at the back ( could not examine due to weight) - getting wound care.   # AKI on CKD - Nephrology consulted   Recommendations  Continue ceftriaxone and metronidazole IV or PO Fu GNR identification and sensi from blood cultures, may be able to transition to PO COVID 19 management per primary team  She will benefit from surgery eval for possible GB removal given gall stones/biliary colic.  Monitor CBC and BMP  Dr Linus Salmons will follow from Monday.   Rest of the management as per the primary team.  Please call with questions or concerns.  Thank you for the consult  Rosiland Oz, MD Infectious Disease Physician [facility] Infect[facility] [address] [place], [place] [address] Phone: [phone]   Fax: [phone]  __________________________________________________________________________________________________________ HPI and Hospital Course: 56 year old female with morbid obesity, DM, CKD, hypothyroidism, Chronic bronchitis, hypertension, hyperlipidemia, ovarian mass who presented to the ED on 2/8 for abdominal pain.  Patient is a poor historian and is crying saying she is hungry and wants to eat.  She started having abdominal pain after eating some ribs, located at the right upper quadrant.  Denied having any nausea, vomiting or diarrhea or dysuria.  Denied fever, chills at the time of admit.  At ED, Tmax 100.   Lactic acid 3.9.  WBC 16.2 with left shift ALP 241, AST 68, ALT 21 US abdomen cholelithiasis without sonographic evidence for acute cholecystitis.  Echogenic liver consistent with hepatic steatosis.  Chest x-ray with no acute changes CT abdomen pelvis cholelithiasis without cholecystitis.  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  Fibroid uterus  ROS: Denies fevers, chills  Denies nausea, vomiting and diarrhea Continues to have rt upper abdominal pain   Past Medical History:  Diagnosis Date   Abnormal uterine bleeding    Cellulitis    Chronic bronchitis (HCC)    CKD (chronic kidney disease), stage III (HCC)    Diabetes mellitus    Diabetes mellitus without complication ([place]) 4/85/4627   Qualifier: Diagnosis of  By: Doy Mince LPN, Megan     Hyperlipidemia    Hypertension    Hypokalemia 12/08/2012   Neuropathy    Past Surgical History:  Procedure Laterality Date   TONSILLECTOMY      Scheduled Meds:  allopurinol  100 mg Oral Daily   arformoterol  15 mcg Nebulization BID    budesonide (PULMICORT) nebulizer solution  0.25 mg Nebulization BID   enoxaparin (LOVENOX) injection  40 mg Subcutaneous Q24H   insulin aspart  0-15 Units Subcutaneous TID WC   insulin aspart  0-5 Units Subcutaneous QHS   insulin aspart  3 Units Subcutaneous TID WC   insulin glargine-yfgn  20 Units Subcutaneous QHS   ipratropium-albuterol  3 mL Nebulization BID   levothyroxine  50 mcg Oral Q0600   methylPREDNISolone (SOLU-MEDROL) injection  40 mg Intravenous Q12H   multivitamin with minerals  1 tablet Oral Daily   pantoprazole  40 mg Oral Daily   rosuvastatin  10 mg Oral QHS   sodium chloride flush  3 mL Intravenous Q12H   Continuous Infusions:  cefTRIAXone (ROCEPHIN)  IV 2 g (05/19/21 1811)   metronidazole 500 mg (05/20/21 0350)   remdesivir 100 mg in NS 100 mL 100 mg (05/20/21 1040)   PRN Meds:.acetaminophen **OR** acetaminophen, albuterol, oxyCODONE-acetaminophen  Allergies  Allergen Reactions   Penicillins Shortness Of Breath    Inflates bronchitis Tolerates Keflex, Rocephin    Ibuprofen Other (See Comments)    Can not take due to kidney problems.    Peach Flavor Swelling    Peaches.    Strawberry Extract Swelling   Social History   Socioeconomic History   Marital status: Single    Spouse name: Not on file   Number of children: 0   Years of education: 12   Highest education level: Not on file  Occupational History   Occupation: Disabled.  CNA prior to disability.    Employer: NOT EMPLOYED  Tobacco Use   Smoking status: Former    Packs/day: 0.10    Years: 23.00    Pack years: 2.30    Types: Cigarettes   Smokeless tobacco: Never  Vaping Use   Vaping Use: Never used  Substance and Sexual Activity   Alcohol use: Not Currently    Comment: Occasional use: 4 drinks per occasion, once a month   Drug use: No   Sexual activity: Not Currently    Birth control/protection: None  Other Topics Concern   Not on file  Social History Narrative   Single.  Lives with  mother  Ambulates with a cane   Right handed   Drinks coffee   One story    Social Determinants of Health   Financial Resource Strain: Not on file  Food Insecurity: Not on file  Transportation Needs: Not on file  Physical Activity: Not on file  Stress: Not on file  Social Connections: Not on file  Intimate Partner Violence: Not on file   Family History  Problem Relation Age of Onset   Diabetes Mother    Hypertension Mother    Hyperlipidemia Mother    Diabetes Father  Heart disease Father    Diabetes Sister    Vitals BP (!) 117/59 (BP Location: Right Arm)    Pulse 95    Temp 97.6 F (36.4 C) (Oral)    Resp 19    Ht 5\' 4"  (1.626 m)    Wt (!) 159.2 kg    LMP 12/08/2012    SpO2 99%    BMI 60.24 kg/m    Physical Exam Constitutional:  morbidly obese, lying in bed, crying and asking for food     Comments:   Cardiovascular:     Rate and Rhythm: Normal rate and regular rhythm.     Heart sounds:   Pulmonary:     Effort: Pulmonary effort is normal on 3 L [place]    Comments:   Abdominal:     Palpations: Abdomen is soft.     Tenderness: Tenderness in the RUQ,  large abdomen with big pannus ( difficult to elicit RT)  Musculoskeletal:        General: No swelling or tenderness.   Skin:    Comments:   Neurological:     General: No focal deficit present.   Psychiatric:        Mood and Affect: Mood normal. Awake, alert and oriented * 4   Pertinent Microbiology Results for orders placed or performed during the hospital encounter of 05/17/21  Resp Panel by RT-PCR (Flu A&B, Covid) Nasopharyngeal Swab     Status: Abnormal   Collection Time: 05/17/21  4:50 PM   Specimen: Nasopharyngeal Swab; Nasopharyngeal(NP) swabs in vial transport medium  Result Value Ref Range Status   SARS Coronavirus 2 by RT PCR POSITIVE (A) NEGATIVE Final    Comment: (NOTE) SARS-CoV-2 target nucleic acids are DETECTED.  The SARS-CoV-2 RNA is generally detectable in upper respiratory specimens during  the acute phase of infection. Positive results are indicative of the presence of the identified virus, but do not rule out bacterial infection or co-infection with other pathogens not detected by the test. Clinical correlation with patient history and other diagnostic information is necessary to determine patient infection status. The expected result is Negative.  Fact Sheet for Patients: EntrepreneurPulse.com.au  Fact Sheet for Healthcare Providers: IncredibleEmployment.be  This test is not yet approved or cleared by the [place] FDA and  has been authorized for detection and/or diagnosis of SARS-CoV-2 by FDA under an Emergency Use Authorization (EUA).  This EUA will remain in effect (meaning this test can be used) for the duration of  the COVID-19 declaration under Section 564(b)(1) of the A ct, 21 U.S.C. section 360bbb-3(b)(1), unless the authorization is terminated or revoked sooner.     Influenza A by PCR NEGATIVE NEGATIVE Final   Influenza B by PCR NEGATIVE NEGATIVE Final    Comment: (NOTE) The Xpert Xpress SARS-CoV-2/FLU/RSV plus assay is intended as an aid in the diagnosis of influenza from Nasopharyngeal swab specimens and should not be used as a sole basis for treatment. Nasal washings and aspirates are unacceptable for Xpert Xpress SARS-CoV-2/FLU/RSV testing.  Fact Sheet for Patients: EntrepreneurPulse.com.au  Fact Sheet for Healthcare Providers: IncredibleEmployment.be  This test is not yet approved or cleared by the [place] FDA and has been authorized for detection and/or diagnosis of SARS-CoV-2 by FDA under an Emergency Use Authorization (EUA). This EUA will remain in effect (meaning this test can be used) for the duration of the COVID-19 declaration under Section 564(b)(1) of the Act, 21 U.S.C. section 360bbb-3(b)(1), unless the authorization is terminated  or revoked.  Performed at [facility], [place] [address] [place], [place] [address]   Blood culture (routine x 2)     Status: None (Preliminary result)   Collection Time: 05/17/21  4:50 PM   Specimen: BLOOD  Result Value Ref Range Status   Specimen Description   Final    BLOOD LEFT ANTECUBITAL Performed at [facility] [address] [place], [place] [address]    Special Requests   Final    BOTTLES DRAWN AEROBIC AND ANAEROBIC Blood Culture results may not be optimal due to an inadequate volume of blood received in culture bottles Performed at [facility] [address], [place], [place] [address]    Culture  Setup Time   Final    GRAM NEGATIVE RODS ANAEROBIC BOTTLE ONLY CRITICAL VALUE NOTED.  VALUE IS CONSISTENT WITH PREVIOUSLY REPORTED AND CALLED VALUE. Performed at [facility], [place] [address], [place], [place] [address]    Culture GRAM NEGATIVE RODS  Final   Report Status PENDING  Incomplete  Blood culture (routine x 2)     Status: None (Preliminary result)   Collection Time: 05/17/21  4:50 PM   Specimen: BLOOD  Result Value Ref Range Status   Specimen Description   Final    BLOOD RIGHT ANTECUBITAL Performed at [facility] [address] [place], [place] [address]    Special Requests   Final    BOTTLES DRAWN AEROBIC AND ANAEROBIC Blood Culture results may not be optimal due to an inadequate volume of blood received in culture bottles Performed at [facility] [address], [place], [place] [address]    Culture  Setup Time   Final    GRAM NEGATIVE RODS ANAEROBIC BOTTLE ONLY CRITICAL RESULT CALLED TO, READ BACK BY AND VERIFIED WITH: PHARMD MICHELLE BELL 05/19/21@6 :21 BY TW    Culture   Final    GRAM NEGATIVE RODS CULTURE REINCUBATED FOR BETTER GROWTH Performed at [facility], [place] [address], [place], [place] [address]    Report Status PENDING   Incomplete  Blood Culture ID Panel (Reflexed)     Status: Abnormal   Collection Time: 05/17/21  4:50 PM  Result Value Ref Range Status   Enterococcus faecalis NOT DETECTED NOT DETECTED Final   Enterococcus Faecium NOT DETECTED NOT DETECTED Final   Listeria monocytogenes NOT DETECTED NOT DETECTED Final   Staphylococcus species NOT DETECTED NOT DETECTED Final   Staphylococcus aureus (BCID) NOT DETECTED NOT DETECTED Final   Staphylococcus epidermidis NOT DETECTED NOT DETECTED Final   Staphylococcus lugdunensis NOT DETECTED NOT DETECTED Final   Streptococcus species NOT DETECTED NOT DETECTED Final   Streptococcus agalactiae NOT DETECTED NOT DETECTED Final   Streptococcus pneumoniae NOT DETECTED NOT DETECTED Final   Streptococcus pyogenes NOT DETECTED NOT DETECTED Final   A.calcoaceticus-baumannii NOT DETECTED NOT DETECTED Final   Bacteroides fragilis DETECTED (A) NOT DETECTED Final    Comment: CRITICAL RESULT CALLED TO, READ BACK BY AND VERIFIED WITH: PHARMD MICHELLE BELL 05/19/21@6 :21 BY TW    Enterobacterales NOT DETECTED NOT DETECTED Final   Enterobacter cloacae complex NOT DETECTED NOT DETECTED Final   Escherichia coli NOT DETECTED NOT DETECTED Final   Klebsiella aerogenes NOT DETECTED NOT DETECTED Final   Klebsiella oxytoca NOT DETECTED NOT DETECTED Final   Klebsiella pneumoniae NOT DETECTED NOT DETECTED Final   Proteus species NOT DETECTED NOT DETECTED Final   Salmonella species NOT DETECTED NOT DETECTED Final   Serratia marcescens  NOT DETECTED NOT DETECTED Final   Haemophilus influenzae NOT DETECTED NOT DETECTED Final   Neisseria meningitidis NOT DETECTED NOT DETECTED Final   Pseudomonas aeruginosa NOT DETECTED NOT DETECTED Final   Stenotrophomonas maltophilia NOT DETECTED NOT DETECTED Final   Candida albicans NOT DETECTED NOT DETECTED Final   Candida auris NOT DETECTED NOT DETECTED Final   Candida glabrata NOT DETECTED NOT DETECTED Final   Candida krusei NOT DETECTED NOT  DETECTED Final   Candida parapsilosis NOT DETECTED NOT DETECTED Final   Candida tropicalis NOT DETECTED NOT DETECTED Final   Cryptococcus neoformans/gattii NOT DETECTED NOT DETECTED Final    Comment: Performed at [facility], [place] [address], [place], [place] [address]     Pertinent Lab seen by me: CBC Latest Ref Rng & Units 05/18/2021 05/17/2021 04/18/2021  WBC 4.0 - 10.5 K/uL 15.1(H) 16.2(H) 6.6  Hemoglobin 12.0 - 15.0 g/dL 10.2(L) 11.7(L) 11.7(L)  Hematocrit 36.0 - 46.0 % 32.3(L) 36.9 37.1  Platelets 150 - 400 K/uL 211 232 245   CMP Latest Ref Rng & Units 05/20/2021 05/19/2021 05/18/2021  Glucose 70 - 99 mg/dL 230(H) 111(H) 158(H)  BUN 6 - 20 mg/dL 43(H) 25(H) 20  Creatinine 0.44 - 1.00 mg/dL 2.20(H) 1.90(H) 2.04(H)  Sodium 135 - 145 mmol/L 126(L) 129(L) 129(L)  Potassium 3.5 - 5.1 mmol/L 4.1 4.5 4.0  Chloride 98 - 111 mmol/L 96(L) 101 100  CO2 22 - 32 mmol/L 20(L) 19(L) 19(L)  Calcium 8.9 - 10.3 mg/dL 7.7(L) 7.6(L) 7.7(L)  Total Protein 6.5 - 8.1 g/dL - - 6.3(L)  Total Bilirubin 0.3 - 1.2 mg/dL - - 0.5  Alkaline Phos 38 - 126 U/L - - 241(H)  AST 15 - 41 U/L - - 68(H)  ALT 0 - 44 U/L - - 21     Pertinent Imagings/Other Imagings Plain films and CT images have been personally visualized and interpreted; radiology reports have been reviewed. Decision making incorporated into the Impression / Recommendations.  CT ABDOMEN PELVIS WO CONTRAST  Result Date: 05/17/2021 CLINICAL DATA:  Epigastric and right upper abdominal pain EXAM: CT ABDOMEN AND PELVIS WITHOUT CONTRAST TECHNIQUE: Multidetector CT imaging of the abdomen and pelvis was performed following the standard protocol without IV contrast. RADIATION DOSE REDUCTION: This exam was performed according to the departmental dose-optimization program which includes automated exposure control, adjustment of the mA and/or kV according to patient size and/or use of iterative reconstruction technique. COMPARISON:  05/17/2021, 03/21/2021  FINDINGS: Lower chest: Scattered hypoventilatory changes at the lung bases. No acute pleural or parenchymal lung disease. Hepatobiliary: Calcified gallstones layer dependently in the gallbladder. No evidence of acute cholecystitis. Unremarkable unenhanced appearance of the liver. Pancreas: Unremarkable unenhanced appearance. Spleen: Unremarkable unenhanced appearance. Adrenals/Urinary Tract: No urinary tract calculi or obstructive uropathy within either kidney. Adrenals are unremarkable. Bladder is decompressed, limiting its evaluation. Stomach/Bowel: No bowel obstruction or ileus. No bowel wall thickening or inflammatory change. Vascular/Lymphatic: No significant vascular findings are present. No enlarged abdominal or pelvic lymph nodes. Reproductive: Fat containing left ovarian mass measuring up to 13.3 x 9.2 cm, consistent with dermoid. Right ovary is unremarkable. Stable uterine calcifications consistent with fibroids. Other: No free fluid or free gas.  No abdominal wall hernia. Musculoskeletal: No acute or destructive bony lesions. Reconstructed images demonstrate no additional findings. IMPRESSION: 1. Cholelithiasis without cholecystitis. 2. 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 3. Fibroid uterus. Electronically Signed   By: Randa Ngo M.D.   On: 05/17/2021 22:51  US Abdomen Complete  Result Date: 05/17/2021 CLINICAL DATA:  Right upper quadrant pain EXAM: ABDOMEN ULTRASOUND COMPLETE COMPARISON:  CT 03/21/2021 FINDINGS: Gallbladder: Multiple small shadowing stones and gallbladder sludge. Normal wall thickness. Negative sonographic [place]. Common bile duct: Diameter: 6 mm Liver: Diffusely echogenic. No focal hepatic abnormality. Portal vein is patent on color Doppler imaging with normal direction of blood flow towards the liver. IVC: No abnormality visualized. Pancreas: Visualized portion unremarkable. Spleen: Size and appearance within normal limits. Right  Kidney: Length: 9.8 cm. Echogenicity within normal limits. Mild upper pole hydronephrosis but no pelvic dilatation. Left Kidney: Length: 9.8 cm. Echogenicity within normal limits. No mass or hydronephrosis visualized. Abdominal aorta: No aneurysm visualized. Other findings: None. IMPRESSION: 1. Cholelithiasis without sonographic evidence for acute cholecystitis. 2. Echogenic liver consistent with hepatic steatosis 3. Mild upper pole caliectasis on the right without renal pelvis dilatation. Electronically Signed   By: Donavan Foil M.D.   On: 05/17/2021 19:31   DG Chest Portable 1 View  Result Date: 05/17/2021 CLINICAL DATA:  Abdominal pain.  History of ovarian cysts. EXAM: PORTABLE CHEST 1 VIEW COMPARISON:  03/21/2021 FINDINGS: Shallow inspiration. Heart size and pulmonary vascularity are normal. Increased density of the left chest likely is due to overlying soft tissue attenuation and patient rotation. No definite consolidation or effusion. No pneumothorax. IMPRESSION: Shallow inspiration.  No evidence of active pulmonary disease. Electronically Signed   By: Lucienne Capers M.D.   On: 05/17/2021 19:42    I spent 81  minutes for this patient encounter including review of prior medical records/discussing diagnostics and treatment plan with the patient/family/coordinate care with primary/other specialits with greater than 50% of time in face to face encounter.   Electronically signed by:   Rosiland Oz, MD Infectious Disease Physician [facility] for Infectious Disease Pager: [phone]

## 2021-05-21 LAB — CULTURE, BLOOD (ROUTINE X 2)

## 2021-05-21 LAB — BASIC METABOLIC PANEL
Anion gap: 12 (ref 5–15)
BUN: 41 mg/dL — ABNORMAL HIGH (ref 6–20)
CO2: 18 mmol/L — ABNORMAL LOW (ref 22–32)
Calcium: 7.5 mg/dL — ABNORMAL LOW (ref 8.9–10.3)
Chloride: 95 mmol/L — ABNORMAL LOW (ref 98–111)
Creatinine, Ser: 2.07 mg/dL — ABNORMAL HIGH (ref 0.44–1.00)
GFR, Estimated: 28 mL/min — ABNORMAL LOW (ref >60)
Glucose, Bld: 222 mg/dL — ABNORMAL HIGH (ref 70–99)
Potassium: 4.2 mmol/L (ref 3.5–5.1)
Sodium: 125 mmol/L — ABNORMAL LOW (ref 135–145)

## 2021-05-21 LAB — OSMOLALITY: Osmolality: 285 mOsm/kg (ref 275–295)

## 2021-05-21 LAB — HEPATIC FUNCTION PANEL
ALT: 18 U/L (ref 0–44)
AST: 48 U/L — ABNORMAL HIGH (ref 15–41)
Albumin: 2.1 g/dL — ABNORMAL LOW (ref 3.5–5.0)
Alkaline Phosphatase: 153 U/L — ABNORMAL HIGH (ref 38–126)
Bilirubin, Direct: 0.2 mg/dL (ref 0.0–0.2)
Indirect Bilirubin: 0.4 mg/dL (ref 0.3–0.9)
Total Bilirubin: 0.6 mg/dL (ref 0.3–1.2)
Total Protein: 6.6 g/dL (ref 6.5–8.1)

## 2021-05-21 LAB — GLUCOSE, CAPILLARY
Glucose-Capillary: 244 mg/dL — ABNORMAL HIGH (ref 70–99)
Glucose-Capillary: 273 mg/dL — ABNORMAL HIGH (ref 70–99)
Glucose-Capillary: 266 mg/dL — ABNORMAL HIGH (ref 70–99)
Glucose-Capillary: 255 mg/dL — ABNORMAL HIGH (ref 70–99)

## 2021-05-21 LAB — OSMOLALITY, URINE: Osmolality, Ur: 205 mOsm/kg — ABNORMAL LOW (ref 300–900)

## 2021-05-21 LAB — C-REACTIVE PROTEIN: CRP: 15.9 mg/dL — ABNORMAL HIGH (ref <1.0)

## 2021-05-21 LAB — CREATININE, URINE, RANDOM: Creatinine, Urine: 64.09 mg/dL

## 2021-05-21 LAB — SODIUM, URINE, RANDOM: Sodium, Ur: <10 mmol/L

## 2021-05-21 MED ORDER — METHYLPREDNISOLONE SODIUM SUCC 40 MG IJ SOLR
40.0000 mg | Freq: Two times a day (BID) | INTRAMUSCULAR | Status: AC
  Administered 2021-05-21: 40 mg via INTRAVENOUS
  Filled 2021-05-21: qty 1

## 2021-05-21 MED ORDER — ENSURE MAX PROTEIN PO LIQD
11.0000 [oz_av] | Freq: Two times a day (BID) | ORAL | Status: DC
  Administered 2021-05-21 – 2021-05-25 (×7): 11 [oz_av] via ORAL
  Filled 2021-05-21 (×10): qty 330

## 2021-05-21 MED ORDER — INSULIN ASPART 100 UNIT/ML IJ SOLN
5.0000 [IU] | Freq: Three times a day (TID) | INTRAMUSCULAR | Status: DC
  Administered 2021-05-21 – 2021-05-22 (×5): 5 [IU] via SUBCUTANEOUS

## 2021-05-21 MED ORDER — PREDNISONE 20 MG PO TABS
40.0000 mg | ORAL_TABLET | Freq: Every day | ORAL | Status: DC
  Administered 2021-05-22: 40 mg via ORAL
  Filled 2021-05-21: qty 2

## 2021-05-21 MED ORDER — INSULIN GLARGINE-YFGN 100 UNIT/ML ~~LOC~~ SOLN
25.0000 [IU] | Freq: Every day | SUBCUTANEOUS | Status: DC
  Administered 2021-05-21: 25 [IU] via SUBCUTANEOUS
  Filled 2021-05-21 (×2): qty 0.25

## 2021-05-21 MED ORDER — GLUCERNA SHAKE PO LIQD
237.0000 mL | ORAL | Status: DC
  Administered 2021-05-21 – 2021-05-25 (×5): 237 mL via ORAL
  Filled 2021-05-21 (×5): qty 237

## 2021-05-21 NOTE — Progress Notes (Addendum)
ID Brief Note  Remains afebrile Tolerating PO diet  On IV remdesevir and IV solumedrol for COVID 19 per primary   Surgery recommended OP fu for cholecystectomy Nephrology following for AKI and hyponatremia  OK to switch IV ceftriaxone/Metronidazole to Augmentin when ready for discharge to complete 10 day course ( IV and PO). EOT 05/26/21 Will follow in the clinic.  Rest of the management per primary ID will sign off  Rosiland Oz, MD Infectious Disease Physician [facility] for Infectious Disease [address] [place], [place] [address] Phone: [phone]   Fax: [phone]

## 2021-05-21 NOTE — Plan of Care (Signed)
°  Problem: Clinical Measurements: °Goal: Ability to maintain clinical measurements within normal limits will improve °Outcome: Progressing °Goal: Will remain free from infection °Outcome: Progressing °Goal: Diagnostic test results will improve °Outcome: Progressing °Goal: Respiratory complications will improve °Outcome: Progressing °  °

## 2021-05-21 NOTE — Progress Notes (Signed)
Initial Nutrition Assessment RD working remotely.  DOCUMENTATION CODES:   Morbid obesity  INTERVENTION:  - will order Glucerna Shake once/day, each supplement provides 220 kcal and 10 grams of protein. - will order Ensure Max BID, each supplement provides 150 kcal and 30 grams of protein. - complete NFPE when feasible.    NUTRITION DIAGNOSIS:   Increased nutrient needs related to acute illness, catabolic illness, wound healing (COVID infection) as evidenced by estimated needs.  GOAL:   Patient will meet greater than or equal to 90% of their needs  MONITOR:   PO intake, Supplement acceptance, Labs, Weight trends, Skin  REASON FOR ASSESSMENT:   Malnutrition Screening Tool    ASSESSMENT:   56 year old female with medical history of DM, stage 3 CKD, obesity, hypothyroidism, HTN, chronic bronchitis, and HLD. She presented to the ED due to abdominal pain that began that morning, was severe and mainly in RUQ. No N/V/D reported. She was found to be COVID-19 positive in the ED and was admitted for sepsis 2/2 COVID.  Diet advanced from NPO to Carb Modified after dinner on 2/8. No meal completion documentation in the flow sheet this admission.  She was last assessed by a [place] RD on 04/16/21. On that date and this AM patient unable to be reached by phone.  Weight today is 361 lb and weight is up from admission weight of 350 lb. Weight has been fluctuating over the past 1 year. No edema at this time per documentation in the edema section of flow sheet.   She is noted to be +5.6 L since admission.   Labs reviewed; CBG: 244 mg/dl, Na: 125 mmol/l, Cl: 95 mmol/l, BUN: 41 mg/dl, creatinine: 2.07 mg/dl, Ca: 7.5 mg/dl, GFR: 28 ml/min.   Medications reviewed; sliding scale novolog, 5 units novolog TID, 25 units semglee/day, 50 mcg oral synthroid/day, 40 mg solu-medrol BID, 1 tablet multivitamin with minerals/day, 100 mg IV remdesivir x1 dose/day 2/9-2/12,   IVF; D5-150 mEq sodium  bicarb @ 125 ml/hr (510 kcal/24 hrs).     NUTRITION - FOCUSED PHYSICAL EXAM:  RD working remotely.  Diet Order:   Diet Order             Diet Carb Modified Fluid consistency: Thin; Room service appropriate? Yes  Diet effective now                   EDUCATION NEEDS:   Not appropriate for education at this time  Skin:  Skin Assessment: Skin Integrity Issues: Skin Integrity Issues:: Stage II Stage II: sacrum, vertebral column, L thigh  Last BM:  2/11 (type 6, medium amount)  Height:   Ht Readings from Last 1 Encounters:  05/19/21 5\' 4"  (1.626 m)    Weight:   Wt Readings from Last 1 Encounters:  05/21/21 (!) 163.8 kg     BMI:  Body mass index is 61.99 kg/m.   Estimated Nutritional Needs:  Kcal:  2200-2400 kcal Protein:  115-130 grams Fluid:  >/= 2.5 L/day      Jarome Matin, MS, RD, LDN Inpatient Clinical Dietitian RD pager # available in [place]  After hours/weekend pager # available in [facility]

## 2021-05-21 NOTE — Progress Notes (Signed)
Patient at present has no IV access. IV team consult has been placed. Will hold IV meds and continuous fluids at this time until PIV is successfully placed by IV team

## 2021-05-21 NOTE — Progress Notes (Signed)
Makayla [name] Progress Note  Subjective: UOP much better, 750 cc yesterday and 1650 cc UOP so far today. Pt is very thirsty, drinking lots of H2O as she does at home.   Vitals:   05/20/21 2019 05/20/21 2020 05/21/21 0523 05/21/21 0536  BP:   108/68   Pulse:   (!) 102   Resp:   20   Temp:   98.4 F (36.9 C)   TempSrc:   Oral   SpO2: 100% 100% 98%   Weight:    (!) 163.8 kg  Height:        Exam: Gen alert, obese, pleasant, no distress No jvd or bruits Chest clear bilat to bases, some exp wheezing RRR no RG Abd soft obese w/ marked pannus ntnd no mass or ascites +bs Ext no LE or UE edema Neuro is alert, Ox 3 , nf     Home meds include - allopurinol, norvasc 5, lasix 20, glipizdie, synthroid, prilosec, crestor 10, symbicort, tresiba insulin , felxeril, MVI, prns/ vits/ supps          Date                          Creat               eGFR   2009- 2017                1.42- 2.00   2019                          2.28   2021                          2.34   Dec 13-16, 2022       1.85- 2.98        18- 31 ml/min, stage IV   Apr 04, 2021             1.39                 45   Jan 4- 10, 2023         1.05- 1.54        44- >60 ml/min                           Feb 8                         1.96                 30   Feb 9                         2.04   Feb 10                       1.90   May 20, 2021             2.20                 26         CXR 2/8 - IMPRESSION: Shallow inspiration.  No evidence of active pulmonary disease.      Abd CT w/o contrast 05/17/21 - Adrenals/Urinary Tract: No urinary tract calculi or obstructive uropathy within either kidney. Adrenals are  unremarkable. Bladder is decompressed, limiting its evaluation       Renal US 2/8 - R 9.8/ 9.8 cm , normal echotexture and no hydronephrosis      UA 2/8 - clear, 100 prot, 1.024, 0-5 rbc/ wbc      UNa <10, UCr 64      UOsm 205     Assessment/ Plan: AKI on CKD 3a - b/l creatinine from Jan 4-10 was 1.05- 1.54,  eGFR 44- 60 ml/min.  In past years her renal function was worse w/ stage IV disease in Dec 2022. On admit creat 1.96 in setting of abdominal pain, poor po intake and acute COVID infection. No obstruction by renal US, UA negative and BP's were borderline low. AKI suspected due to borderline hypotension from sepsis/ infection and volume depletion. Rebolused 1.5L NS and continued IVF"s. UOP improved yest and today. Creat down slightly today to 2.0. Cont IVF at 125 cc/hr. Labs in am.  Hyponatremia - Na down to 125, not symptomatic. UNa low and UOsm borderline, suspect this is still hypovolemic and not SIADH. Cont repleting vol w/ isotonic saline.  Bacteroides fragilis bacteremia - with +LG fevers and WBC 15K. Blood cx's were + for bacteroides fragilis. She is getting Rocephin/ flagyl  COVID + infection - getting remdesivir, steroids Chronic bronchitis - nebs, etc DM2 - per pmd HTN - bp's soft , holding home antiHTN meds Super morbid obesity           [name] 05/21/2021, 6:43 AM   Recent Labs  Lab 05/17/21 1650 05/18/21 0500 05/19/21 0401 05/20/21 0353 05/21/21 0403  K 4.3 4.0   < > 4.1 4.2  BUN 20 20   < > 43* 41*  CREATININE 1.96* 2.04*   < > 2.20* 2.07*  ALBUMIN 2.5* 2.0*  --   --   --   CALCIUM 8.5* 7.7*   < > 7.7* 7.5*  HGB 11.7* 10.2*  --   --   --    < > = values in this interval not displayed.   Inpatient medications:  allopurinol  100 mg Oral Daily   arformoterol  15 mcg Nebulization BID   budesonide (PULMICORT) nebulizer solution  0.25 mg Nebulization BID   enoxaparin (LOVENOX) injection  0.5 mg/kg Subcutaneous Q24H   insulin aspart  0-15 Units Subcutaneous TID WC   insulin aspart  0-5 Units Subcutaneous QHS   insulin aspart  5 Units Subcutaneous TID WC   insulin glargine-yfgn  25 Units Subcutaneous QHS   ipratropium-albuterol  3 mL Nebulization BID   levothyroxine  50 mcg Oral Q0600   methylPREDNISolone (SOLU-MEDROL) injection  40 mg Intravenous Q12H    multivitamin with minerals  1 tablet Oral Daily   pantoprazole  40 mg Oral Daily   rosuvastatin  10 mg Oral QHS   sodium chloride flush  3 mL Intravenous Q12H    cefTRIAXone (ROCEPHIN)  IV 2 g (05/20/21 1742)   metronidazole 500 mg (05/21/21 0533)   remdesivir 100 mg in NS 100 mL 100 mg (05/20/21 1040)   sodium bicarbonate 150 mEq in D5W infusion 125 mL/hr at 05/21/21 0044   acetaminophen **OR** acetaminophen, albuterol, oxyCODONE-acetaminophen

## 2021-05-21 NOTE — Progress Notes (Signed)
[facility]                                                                               Makayla [name], is a 56 y.o. female, DOB - 1966-03-24, XVQ:[id number] Admit date - 05/17/2021    Outpatient Primary MD for the patient is Arthur Holms, NP  LOS - 3  days    Brief summary   Patient is a 56 year old female with diabetes mellitus, CKD stage IIIb, obesity, hypothyroidism, hypertension, hyperlipidemia presented with abdominal pain.  Patient reported that she started having abdominal pain on the morning of admission after eating some ribs, severe, right upper quadrant.  No nausea vomiting or diarrhea.  Also reported mild shortness of breath, has history of chronic bronchitis.  No fevers or chills or constipation  In ED, temp 100 F, pulse 119, BP 114/74, respiratory rate 28-30, O2 sats 97 to 100% on room air Sodium 132, creatinine 1.96, lactic acidosis 3.9, procalcitonin 133.94.  COVID-19 positive.  Patient was admitted with sepsis secondary to COVID-19.  Abdominal pain due to unclear etiology.  Blood cultures positive for Bacteroides fragilis   Assessment & Plan    Assessment and Plan: * Sepsis ([place]) with bacteremia- (present on admission) - Patient met severe sepsis criteria at the time of admission with tachycardia, borderline BP, lactic acidosis, acute kidney injury on CKD, lactic acidosis, possibly due to [id number]-[id number] and biliary colic -Lactic acid 3.9 on admission, trending down.   -Procalcitonin 133.94 on admission, improving, continue IV Rocephin and Flagyl  -Blood cultures positive for bacteroids fragilis, ID following  Abdominal pain - Unclear etiology, CT abdomen showed cholelithiasis without cholecystitis, no pancreatitis or diverticulitis/colitis on CT.  Lipase 24.  No ovarian torsion seen on CT abdomen. -Abdominal ultrasound showed cholelithiasis without sonographic evidence for acute cholecystitis. - blood cultures+ bacteroids fragilis, continue IV Rocephin,  Flagyl.  ID following. -LFTs mildly elevated however patient has no fevers or abdominal pain, tolerating diet.  Discussed with general surgery, Dr. Donne Hazel, recommended outpatient follow-up, no urgent need for cholecystectomy inpatient.   COVID-19 virus infection- (present on admission) - Presented with sepsis, tachycardia, borderline BP, lactic acidosis, febrile, COVID-19 positive -O2 sats 100% on 2 L, wean O2 as tolerated.   -no fevers or chills, overall improving, on remdesivir day #5/5 -wheezing improving, -Increase mobility, OOB, PT -Hypoxia improving, will taper IV Solu-Medrol to oral prednisone in a.m.  Chronic bronchitis (HCC) with acute exacerbation - Wheezing now improving, continue IV Solu-Medrol today, taper to oral prednisone in a.m.   -Continue scheduled nebs, Brovana, Pulmicort, flutter valve    Acute renal failure superimposed on stage 3b chronic kidney disease ([place]) - Likely worsened due to severe sepsis, COVID-19, dehydration. -Patient received IV fluid hydration, positive balance of 5.0 L -Creatinine slightly better today 2.0 however sodium still trending down.  Nephrology following  Type 2 diabetes mellitus with chronic kidney disease, with long-term current use of insulin ([place]) -CBGs elevated likely due to steroids, transitioning to oral prednisone in a.m.  -Increased Semglee to 25 units at bedtime, NovoLog meal coverage to 5 units 3 times daily AC, SSI   Hypertension associated with diabetes ([place])- (present on admission) - BP soft,  continue to hold antihypertensives  Pressure injury of back, stage 2 (HCC)- (present on admission) - Present on admission -Wound care per nursing  Hypothyroidism- (present on admission) - Continue Synthroid  Obesity, morbid (HCC)- (present on admission) - Encourage lifestyle changes Estimated body mass index is 60.24 kg/m as calculated from the following:   Height as of this encounter: 5\' 4"  (1.626 m).   Weight as of this  encounter: 159.2 kg.  Hyperlipidemia associated with type 2 diabetes mellitus (HCC)- (present on admission) - Continue statin   Code Status: Full CODE STATUS DVT Prophylaxis:   Level of Care: Level of care: Progressive Family Communication: Updated patient, no family at the bedside  Disposition Plan:     Remains inpatient appropriate: Hopefully DC home in 24 to 48 hours with ID recommendations.  Completing remdesivir today.  Procedures:  none  Consultants:   ID Nephrology Surgery  Antimicrobials:   IV remdesivir 05/17/2021--- 05/21/2021   IV metronidazole 05/17/2021 -> IV Rocephin 05/17/2021 - >   Scheduled Meds:  allopurinol  100 mg Oral Daily   arformoterol  15 mcg Nebulization BID   budesonide (PULMICORT) nebulizer solution  0.25 mg Nebulization BID   enoxaparin (LOVENOX) injection  0.5 mg/kg Subcutaneous Q24H   feeding supplement (GLUCERNA SHAKE)  237 mL Oral Q24H   insulin aspart  0-15 Units Subcutaneous TID WC   insulin aspart  0-5 Units Subcutaneous QHS   insulin aspart  5 Units Subcutaneous TID WC   insulin glargine-yfgn  25 Units Subcutaneous QHS   ipratropium-albuterol  3 mL Nebulization BID   levothyroxine  50 mcg Oral Q0600   methylPREDNISolone (SOLU-MEDROL) injection  40 mg Intravenous Q12H   multivitamin with minerals  1 tablet Oral Daily   pantoprazole  40 mg Oral Daily   [START ON 05/22/2021] predniSONE  40 mg Oral QAC breakfast   Ensure Max Protein  11 oz Oral BID   rosuvastatin  10 mg Oral QHS   sodium chloride flush  3 mL Intravenous Q12H   Continuous Infusions:  cefTRIAXone (ROCEPHIN)  IV 2 g (05/20/21 1742)   metronidazole 500 mg (05/21/21 0533)   sodium bicarbonate 150 mEq in D5W infusion 125 mL/hr at 05/21/21 0044   PRN Meds:.acetaminophen **OR** acetaminophen, albuterol, oxyCODONE-acetaminophen    Subjective:   Makayla [name] was seen and examined today.  No wheezing or shortness of breath. Patient is tolerating diet without any difficulty.  No  abdominal pain on exam.  Objective:   Vitals:   05/20/21 2020 05/21/21 0523 05/21/21 0536 05/21/21 0848  BP:  108/68    Pulse:  (!) 102    Resp:  20    Temp:  98.4 F (36.9 C)    TempSrc:  Oral    SpO2: 100% 98%  100%  Weight:   (!) 163.8 kg   Height:        Intake/Output Summary (Last 24 hours) at 05/21/2021 1300 Last data filed at 05/21/2021 1227 Gross per 24 hour  Intake 814.03 ml  Output 2050 ml  Net -1235.97 ml   Filed Weights   05/19/21 1331 05/20/21 0624 05/21/21 0536  Weight: (!) 158.6 kg (!) 159.2 kg (!) 163.8 kg     Exam General: Alert and oriented x 3, NAD Cardiovascular: S1 S2 auscultated,  RRR Respiratory: CTA B Gastrointestinal: Obese, soft, nontender, nondistended, + bowel sounds Ext: no pedal edema bilaterally Neuro: no new deficits   Data Reviewed:  I have personally reviewed following labs and imaging studies  CBC Lab Results  Component Value Date   WBC 15.1 (H) 05/18/2021   RBC 3.79 (L) 05/18/2021   HGB 10.2 (L) 05/18/2021   HCT 32.3 (L) 05/18/2021   MCV 85.2 05/18/2021   MCH 26.9 05/18/2021   PLT 211 05/18/2021   MCHC 31.6 05/18/2021   RDW 17.4 (H) 05/18/2021   LYMPHSABS 1.8 05/17/2021   MONOABS 1.2 (H) 05/17/2021   EOSABS 0.0 05/17/2021   BASOSABS 0.0 04/01/8249     Last metabolic panel Lab Results  Component Value Date   NA 125 (L) 05/21/2021   K 4.2 05/21/2021   CL 95 (L) 05/21/2021   CO2 18 (L) 05/21/2021   BUN 41 (H) 05/21/2021   CREATININE 2.07 (H) 05/21/2021   GLUCOSE 222 (H) 05/21/2021   GFRNONAA 28 (L) 05/21/2021   GFRAA 27 (L) 12/24/2019   CALCIUM 7.5 (L) 05/21/2021   PHOS 4.1 12/28/2008   PROT 6.6 05/21/2021   ALBUMIN 2.1 (L) 05/21/2021   BILITOT 0.6 05/21/2021   ALKPHOS 153 (H) 05/21/2021   AST 48 (H) 05/21/2021   ALT 18 05/21/2021   ANIONGAP 12 05/21/2021    CBG (last 3)  Recent Labs    05/20/21 2029 05/21/21 0808 05/21/21 [place]      Eowyn Tabone M.D. [facility] 05/21/2021, 1:00 PM  Available via Epic secure chat 7am-7pm After 7 pm, please refer to night coverage provider listed on amion.

## 2021-05-21 NOTE — Progress Notes (Signed)
Spoke to patient on the phone. Patient states that she has had diabetes "for a long time". Reports that she takes [place] 65 units every am and HS. She could not tell me about the other diabetes medications that are listed on her med record. She states that she does not have low blood sugar at home. Her PCP is at [facility]. She checks her blood sugars once a day. She will be going home to stay with her sister. Her mother lives next door and has diabetes herself.   Patient is anxious to get her own place to live. Discussed her HgbA1C of 7.1%. states that she takes all her medications every day. This A1C is good for her age.   Will continue to follow patient's blood sugars while in the hospital.  Harvel Ricks RN BSN CDE Diabetes Coordinator Pager: [phone]  8am-5pm

## 2021-05-21 NOTE — Progress Notes (Signed)
Inpatient Diabetes Program Recommendations  AACE/ADA: New Consensus Statement on Inpatient Glycemic Control (2015)  Target Ranges:  Prepandial:   less than 140 mg/dL      Peak postprandial:   less than 180 mg/dL (1-2 hours)      Critically ill patients:  140 - 180 mg/dL   Lab Results  Component Value Date   GLUCAP 244 (H) 05/21/2021   HGBA1C 7.1 (H) 05/18/2021    Review of Glycemic Control  Diabetes history: type 2 Outpatient Diabetes medications: Tresiba 56 units BID, [place] 0.5 mg weekly, Glucotrol 5 mg BID Current orders for Inpatient glycemic control: Semglee 25 units daily, Novolog MODERATE correction scale TID & HS scale, Novolog 5 units TID  Inpatient Diabetes Program Recommendations:   Received diabetes coordinator consult. Agree with current insulin orders. If steroids tend to increase her blood sugars, may need to titrate dosages as needed. Will try to talk with patient today on the phone.   Will continue to follow while in the hospital.  Harvel Ricks RN BSN CDE Diabetes Coordinator Pager: [phone]  8am-5pm

## 2021-05-22 LAB — BASIC METABOLIC PANEL
Anion gap: 9 (ref 5–15)
BUN: 46 mg/dL — ABNORMAL HIGH (ref 6–20)
CO2: 20 mmol/L — ABNORMAL LOW (ref 22–32)
Calcium: 7.6 mg/dL — ABNORMAL LOW (ref 8.9–10.3)
Chloride: 94 mmol/L — ABNORMAL LOW (ref 98–111)
Creatinine, Ser: 1.99 mg/dL — ABNORMAL HIGH (ref 0.44–1.00)
GFR, Estimated: 29 mL/min — ABNORMAL LOW (ref >60)
Glucose, Bld: 311 mg/dL — ABNORMAL HIGH (ref 70–99)
Potassium: 4.2 mmol/L (ref 3.5–5.1)
Sodium: 123 mmol/L — ABNORMAL LOW (ref 135–145)

## 2021-05-22 LAB — GLUCOSE, CAPILLARY
Glucose-Capillary: 368 mg/dL — ABNORMAL HIGH (ref 70–99)
Glucose-Capillary: 420 mg/dL — ABNORMAL HIGH (ref 70–99)
Glucose-Capillary: 265 mg/dL — ABNORMAL HIGH (ref 70–99)
Glucose-Capillary: 231 mg/dL — ABNORMAL HIGH (ref 70–99)

## 2021-05-22 LAB — C-REACTIVE PROTEIN: CRP: 9.1 mg/dL — ABNORMAL HIGH (ref <1.0)

## 2021-05-22 MED ORDER — INSULIN GLARGINE-YFGN 100 UNIT/ML ~~LOC~~ SOLN
35.0000 [IU] | Freq: Every day | SUBCUTANEOUS | Status: DC
  Administered 2021-05-22: 35 [IU] via SUBCUTANEOUS
  Filled 2021-05-22: qty 0.35

## 2021-05-22 MED ORDER — SODIUM BICARBONATE 650 MG PO TABS
1300.0000 mg | ORAL_TABLET | Freq: Two times a day (BID) | ORAL | Status: DC
  Administered 2021-05-22 – 2021-05-24 (×5): 1300 mg via ORAL
  Filled 2021-05-22 (×5): qty 2

## 2021-05-22 MED ORDER — INSULIN ASPART 100 UNIT/ML IJ SOLN
8.0000 [IU] | Freq: Three times a day (TID) | INTRAMUSCULAR | Status: DC
  Administered 2021-05-22 – 2021-05-23 (×2): 8 [IU] via SUBCUTANEOUS

## 2021-05-22 MED ORDER — PREDNISONE 20 MG PO TABS
20.0000 mg | ORAL_TABLET | Freq: Every day | ORAL | Status: AC
  Administered 2021-05-23 – 2021-05-24 (×2): 20 mg via ORAL
  Filled 2021-05-22 (×2): qty 1

## 2021-05-22 MED ORDER — INSULIN ASPART 100 UNIT/ML IJ SOLN
0.0000 [IU] | Freq: Three times a day (TID) | INTRAMUSCULAR | Status: DC
  Administered 2021-05-22 – 2021-05-23 (×2): 11 [IU] via SUBCUTANEOUS
  Administered 2021-05-23: 4 [IU] via SUBCUTANEOUS
  Administered 2021-05-23: 7 [IU] via SUBCUTANEOUS
  Administered 2021-05-24 – 2021-05-25 (×2): 3 [IU] via SUBCUTANEOUS
  Administered 2021-05-25: 4 [IU] via SUBCUTANEOUS

## 2021-05-22 NOTE — Progress Notes (Signed)
Inpatient Diabetes Program Recommendations  AACE/ADA: New Consensus Statement on Inpatient Glycemic Control (2015)  Target Ranges:  Prepandial:   less than 140 mg/dL      Peak postprandial:   less than 180 mg/dL (1-2 hours)      Critically ill patients:  140 - 180 mg/dL   Lab Results  Component Value Date   GLUCAP 368 (H) 05/22/2021   HGBA1C 7.1 (H) 05/18/2021    Review of Glycemic Control  Latest Reference Range & Units 05/21/21 11:59 05/21/21 16:10 05/21/21 21:26 05/22/21 07:55  Glucose-Capillary 70 - 99 mg/dL 273 (H) 266 (H) 255 (H) 368 (H)  (H): Data is abnormally high Diabetes history: type 2 Outpatient Diabetes medications: Tresiba 56 units BID, [place] 0.5 mg weekly, Glucotrol 5 mg BID Current orders for Inpatient glycemic control: Semglee 25 units daily, Novolog MODERATE correction scale TID & HS scale, Novolog 5 units TID Prednisone 40 mg QAM (2/13)  Inpatient Diabetes Program Recommendations:    Consider  - increasing Semglee to 35 units QHS -Increasing Novolog 8 units TID (assuming patient is consuming >50% of meals).  Thanks, Bronson Curb, MSN, RNC-OB Diabetes Coordinator [phone] (8a-5p)

## 2021-05-22 NOTE — Progress Notes (Signed)
[facility]                                                                               Makayla [name], is a 56 y.o. female, DOB - 1966/01/02, DGL:[id number] Admit date - 05/17/2021    Outpatient Primary MD for the patient is Arthur Holms, NP  LOS - 4  days    Brief summary   Patient is a 56 year old female with diabetes mellitus, CKD stage IIIb, obesity, hypothyroidism, hypertension, hyperlipidemia presented with abdominal pain.  Patient reported that she started having abdominal pain on the morning of admission after eating some ribs, severe, right upper quadrant.  No nausea vomiting or diarrhea.  Also reported mild shortness of breath, has history of chronic bronchitis.  No fevers or chills or constipation  In ED, temp 100 F, pulse 119, BP 114/74, respiratory rate 28-30, O2 sats 97 to 100% on room air Sodium 132, creatinine 1.96, lactic acidosis 3.9, procalcitonin 133.94.  COVID-19 positive.  Patient was admitted with sepsis secondary to COVID-19.  Abdominal pain due to unclear etiology.  Blood cultures positive for Bacteroides fragilis   Assessment & Plan    Assessment and Plan: * Sepsis ([place]) with bacteremia- (present on admission) - Patient met severe sepsis criteria at the time of admission with tachycardia, borderline BP, lactic acidosis, acute kidney injury on CKD, lactic acidosis, possibly due to JJOAC-16 and biliary colic -Lactic acid 3.9 on admission, trending down.   -Procalcitonin 133.94 on admission, improving, continue IV Rocephin and Flagyl  -Blood cultures positive for bacteroids fragilis, ID following.  Okay to switch Augmentin when ready for discharge to complete 10-day course, end date on 05/26/2021.  Outpatient follow-up in ID clinic. -Sepsis physiology resolved  Abdominal pain - Unclear etiology, CT abdomen showed cholelithiasis without cholecystitis, no pancreatitis or diverticulitis/colitis on CT.  Lipase 24.  No ovarian torsion seen on CT  abdomen. -Abdominal ultrasound showed cholelithiasis without sonographic evidence for acute cholecystitis. - blood cultures+ bacteroids fragilis, continue IV Rocephin, Flagyl.  ID following. -LFTs mildly elevated however patient has no fevers or abdominal pain, tolerating diet.  Discussed with general surgery, Dr. Donne Hazel, recommended outpatient follow-up, no urgent need for cholecystectomy inpatient.   COVID-19 virus infection- (present on admission) - Presented with sepsis, tachycardia, borderline BP, lactic acidosis, febrile, COVID-19 positive -Completed remdesivir, O2 sats 97 to 99% on 2 L, wean O2  -Increase mobility, OOB, PT -CBGs uncontrolled, tapered prednisone to 20 mg daily  Hyponatremia- (present on admission) - Nephrology following, not SIADH, patient was placed on IV normal saline fluids, positive balance of 6 L and patient is eating and tolerating diet very well -Creatinine improving however sodium still on lower side.  Corrected sodium 126 with hyperglycemia  Chronic bronchitis ([place]) with acute exacerbation - Wheezing resolved.  Transitioned to oral prednisone, taper to 20 mg daily -Continue scheduled nebs, Brovana, Pulmicort, flutter valve    Acute renal failure superimposed on stage 3b chronic kidney disease ([place]) - Likely worsened due to severe sepsis, COVID-19, dehydration. -Patient received IV fluid hydration, positive balance of 5.0 L -Creatinine improving, 1.9.  Positive balance of 6.0 L, on IV fluids  Type 2 diabetes mellitus with  chronic kidney disease, with long-term current use of insulin ([place]) -CBGs uncontrolled due to steroids.  Prednisone tapered down to 20 mg  -Increase to Semglee to 35 units at bedtime, NovoLog meal coverage to 8 units 3 times daily AC, resistant sliding scale   Hypertension associated with diabetes ([place])- (present on admission) - BP stable.  Continue to hold antihypertensives  Pressure injury of back, stage 2 ([place])- (present on  admission) - Present on admission -Wound care per nursing  Hypothyroidism- (present on admission) - Continue Synthroid  Obesity, morbid ([place])- (present on admission) - Encourage lifestyle changes Estimated body mass index is 60.24 kg/m as calculated from the following:   Height as of this encounter: $RemoveBeforeD'5\' 4"'[id number]$  (1.626 m).   Weight as of this encounter: 159.2 kg.  Hyperlipidemia associated with type 2 diabetes mellitus ([place])- (present on admission) - Continue statin  Code Status:  DVT Prophylaxis:     Level of Care: Level of care: Progressive Family Communication:  Disposition Plan:     Remains inpatient appropriate: Hopefully DC home in a.m. if sodium is improving.  Patient is eating and tolerating diet very well, no worsening hypoxia  Procedures:    Consultants:   Infectious disease Nephrology  Antimicrobials:   IV remdesivir 05/17/2021--- 05/21/2021  IV metronidazole 05/17/2021 -> IV Rocephin 05/17/2021 - >    Medications   allopurinol  100 mg Oral Daily   arformoterol  15 mcg Nebulization BID   budesonide (PULMICORT) nebulizer solution  0.25 mg Nebulization BID   enoxaparin (LOVENOX) injection  0.5 mg/kg Subcutaneous Q24H   feeding supplement (GLUCERNA SHAKE)  237 mL Oral Q24H   insulin aspart  0-20 Units Subcutaneous TID WC   insulin aspart  0-5 Units Subcutaneous QHS   insulin aspart  8 Units Subcutaneous TID WC   insulin glargine-yfgn  35 Units Subcutaneous QHS   ipratropium-albuterol  3 mL Nebulization BID   levothyroxine  50 mcg Oral Q0600   multivitamin with minerals  1 tablet Oral Daily   pantoprazole  40 mg Oral Daily   [START ON 05/23/2021] predniSONE  20 mg Oral QAC breakfast   Ensure Max Protein  11 oz Oral BID   rosuvastatin  10 mg Oral QHS   sodium chloride flush  3 mL Intravenous Q12H     Subjective:   Makayla [name] was seen and examined today.  Denies any specific complaints, no chest pain, shortness of breath or wheezing.  No abdominal pain,  tolerating diet without any difficulty.   Objective:   Vitals:   05/22/21 0609 05/22/21 0634 05/22/21 0858 05/22/21 1129  BP: 117/67   127/60  Pulse: (!) 103   (!) 108  Resp: 20   20  Temp: 99.1 F (37.3 C)   98.5 F (36.9 C)  TempSrc: Oral   Oral  SpO2: 97%  99% 97%  Weight:  (!) 164.6 kg    Height:        Intake/Output Summary (Last 24 hours) at 05/22/2021 1320 Last data filed at 05/22/2021 1026 Gross per 24 hour  Intake 1936.77 ml  Output 950 ml  Net 986.77 ml   Filed Weights   05/20/21 0624 05/21/21 0536 05/22/21 0634  Weight: (!) 159.2 kg (!) 163.8 kg (!) 164.6 kg     Exam General: Alert and oriented x 3, NAD Cardiovascular: S1 S2 auscultated, no murmurs, RRR Respiratory: CTA B Gastrointestinal: Obese, soft, nontender, nondistended, + bowel sounds Ext: no pedal edema bilaterally Neuro: moving all 4 extremities spontaneously  Data Reviewed:  I have personally reviewed following labs    CBC Lab Results  Component Value Date   WBC 15.1 (H) 05/18/2021   RBC 3.79 (L) 05/18/2021   HGB 10.2 (L) 05/18/2021   HCT 32.3 (L) 05/18/2021   MCV 85.2 05/18/2021   MCH 26.9 05/18/2021   PLT 211 05/18/2021   MCHC 31.6 05/18/2021   RDW 17.4 (H) 05/18/2021   LYMPHSABS 1.8 05/17/2021   MONOABS 1.2 (H) 05/17/2021   EOSABS 0.0 05/17/2021   BASOSABS 0.0 20/80/2233     Last metabolic panel Lab Results  Component Value Date   NA 123 (L) 05/22/2021   K 4.2 05/22/2021   CL 94 (L) 05/22/2021   CO2 20 (L) 05/22/2021   BUN 46 (H) 05/22/2021   CREATININE 1.99 (H) 05/22/2021   GLUCOSE 311 (H) 05/22/2021   GFRNONAA 29 (L) 05/22/2021   GFRAA 27 (L) 12/24/2019   CALCIUM 7.6 (L) 05/22/2021   PHOS 4.1 12/28/2008   PROT 6.6 05/21/2021   ALBUMIN 2.1 (L) 05/21/2021   BILITOT 0.6 05/21/2021   ALKPHOS 153 (H) 05/21/2021   AST 48 (H) 05/21/2021   ALT 18 05/21/2021   ANIONGAP 9 05/22/2021    CBG (last 3)  Recent Labs    05/21/21 2126 05/22/21 0755 05/22/21 1125  GLUCAP  255* 368* 420*       Gordan Grell M.D. [facility] 05/22/2021, 1:20 PM  Available via Epic secure chat 7am-7pm After 7 pm, please refer to night coverage provider listed on amion.

## 2021-05-22 NOTE — Progress Notes (Signed)
[facility] Progress Note  Subjective: Patient frustrated about food situation. Hyperglycemic especially on today's labs, corrected Na around 126-128  Vitals:   05/22/21 0609 05/22/21 0634 05/22/21 0858 05/22/21 1129  BP: 117/67   127/60  Pulse: (!) 103   (!) 108  Resp: 20   20  Temp: 99.1 F (37.3 C)   98.5 F (36.9 C)  TempSrc: Oral   Oral  SpO2: 97%  99% 97%  Weight:  (!) 164.6 kg    Height:        Exam: Gen alert, obese, pleasant, no distress No jvd or bruits Chest clear bilat to bases, some exp wheezing RRR no RG Abd soft obese w/ marked pannus ntnd no mass or ascites +bs Ext no LE or UE edema Neuro: alert, awake, speech clear and coherent, moves all ext spontaneously    Recent Labs  Lab 05/17/21 1650 05/18/21 0500 05/19/21 0401 05/21/21 0403 05/21/21 1146 05/22/21 0345  K 4.3 4.0   < > 4.2  --  4.2  BUN 20 20   < > 41*  --  46*  CREATININE 1.96* 2.04*   < > 2.07*  --  1.99*  ALBUMIN 2.5* 2.0*  --   --  2.1*  --   CALCIUM 8.5* 7.7*   < > 7.5*  --  7.6*  HGB 11.7* 10.2*  --   --   --   --    < > = values in this interval not displayed.   Inpatient medications:  allopurinol  100 mg Oral Daily   arformoterol  15 mcg Nebulization BID   budesonide (PULMICORT) nebulizer solution  0.25 mg Nebulization BID   enoxaparin (LOVENOX) injection  0.5 mg/kg Subcutaneous Q24H   feeding supplement (GLUCERNA SHAKE)  237 mL Oral Q24H   insulin aspart  0-20 Units Subcutaneous TID WC   insulin aspart  0-5 Units Subcutaneous QHS   insulin aspart  8 Units Subcutaneous TID WC   insulin glargine-yfgn  35 Units Subcutaneous QHS   ipratropium-albuterol  3 mL Nebulization BID   levothyroxine  50 mcg Oral Q0600   multivitamin with minerals  1 tablet Oral Daily   pantoprazole  40 mg Oral Daily   [START ON 05/23/2021] predniSONE  20 mg Oral QAC breakfast   Ensure Max Protein  11 oz Oral BID   rosuvastatin  10 mg Oral QHS   sodium chloride flush  3 mL Intravenous Q12H     cefTRIAXone (ROCEPHIN)  IV 2 g (05/21/21 1612)   metronidazole 500 mg (05/22/21 0609)   sodium bicarbonate 150 mEq in D5W infusion 125 mL/hr at 05/22/21 1007   acetaminophen **OR** acetaminophen, albuterol, oxyCODONE-acetaminophen     Home meds include - allopurinol, norvasc 5, lasix 20, glipizdie, synthroid, prilosec, crestor 10, symbicort, tresiba insulin , felxeril, MVI, prns/ vits/ supps          Date                          Creat               eGFR   2009- 2017                1.42- 2.00   2019                          2.28   2021  2.34   Dec 13-16, 2022       1.85- 2.98        18- 31 ml/min, stage IV   Apr 04, 2021             1.39                 45   Jan 4- 10, 2023         1.05- 1.54        44- >60 ml/min                           Feb 8                         1.96                 30   Feb 9                         2.04   Feb 10                       1.90   May 20, 2021             2.20                 26         CXR 2/8 - IMPRESSION: Shallow inspiration.  No evidence of active pulmonary disease.      Abd CT w/o contrast 05/17/21 - Adrenals/Urinary Tract: No urinary tract calculi or obstructive uropathy within either kidney. Adrenals are unremarkable. Bladder is decompressed, limiting its evaluation       Renal US 2/8 - R 9.8/ 9.8 cm , normal echotexture and no hydronephrosis      UA 2/8 - clear, 100 prot, 1.024, 0-5 rbc/ wbc      UNa <10, UCr 64      UOsm 205     Assessment/ Plan: AKI on CKD 3a - b/l creatinine from Jan 4-10 was 1.05- 1.54, eGFR 44- 60 ml/min.  In past years her renal function was worse w/ stage IV disease in Dec 2022. On admit creat 1.96 in setting of abdominal pain, poor po intake and acute COVID infection. No obstruction by renal US, UA negative and BP's were borderline low. AKI suspected due to borderline hypotension from sepsis/ infection and volume depletion. Rebolused 1.5L NS and continued IVF"s which is now off. Cr down  to 1.99 today, no need for fluids since she is eating and drinking adequately, will d/c her bicarb infusion and will transition to oral bicarb Hyponatremia - Na down to 125, markedly hyperglycemic on labs, corrected Na around 126-128. Encouraged solute intake, no need for isotonic fluids at this time given that she is eating and drinking adequately, expecting this to improve as her sugars and kidney function improve, will also stop her bicarb infusion Bacteroides fragilis bacteremia - with +LG fevers and WBC 15K. Blood cx's were + for bacteroides fragilis. She is getting Rocephin/ flagyl  COVID + infection -s/p remdesivir, on steroids per primary Chronic bronchitis - nebs, etc DM2 w/ hyperglycemia - per pmd HTN - bp's soft , holding home antiHTN meds morbid obesity   Gean Quint, MD [place]

## 2021-05-22 NOTE — TOC Progression Note (Signed)
Transition of Care [facility]) - Progression Note    Patient Details  Name: Makayla [name] MRN: [id number] Date of Birth: 08/11/1965  Transition of Care [facility] CM/SW Contact  Purcell Mouton, RN Phone Number: 05/22/2021, 10:08 AM  Clinical Narrative:    TOC will continue to follow pt.    Expected Discharge Plan: [place] Barriers to Discharge: No Barriers Identified  Expected Discharge Plan and Services Expected Discharge Plan: [place]   Discharge Planning Services: CM Consult Post Acute Care Choice: [place] arrangements for the past 2 months: Single Family Home                                       Social Determinants of Health (SDOH) Interventions    Readmission Risk Interventions No flowsheet data found.

## 2021-05-22 NOTE — Assessment & Plan Note (Addendum)
-   Nephrology following, not SIADH, patient was placed on IV normal saline fluids; appetite improved - Na 131 at discharge - Ongoing fluid restriction recommended upon discharge as well

## 2021-05-23 LAB — BASIC METABOLIC PANEL
Anion gap: 9 (ref 5–15)
BUN: 47 mg/dL — ABNORMAL HIGH (ref 6–20)
CO2: 22 mmol/L (ref 22–32)
Calcium: 8 mg/dL — ABNORMAL LOW (ref 8.9–10.3)
Chloride: 92 mmol/L — ABNORMAL LOW (ref 98–111)
Creatinine, Ser: 1.83 mg/dL — ABNORMAL HIGH (ref 0.44–1.00)
GFR, Estimated: 33 mL/min — ABNORMAL LOW (ref >60)
Glucose, Bld: 291 mg/dL — ABNORMAL HIGH (ref 70–99)
Potassium: 4.3 mmol/L (ref 3.5–5.1)
Sodium: 123 mmol/L — ABNORMAL LOW (ref 135–145)

## 2021-05-23 LAB — GLUCOSE, CAPILLARY
Glucose-Capillary: 286 mg/dL — ABNORMAL HIGH (ref 70–99)
Glucose-Capillary: 209 mg/dL — ABNORMAL HIGH (ref 70–99)
Glucose-Capillary: 205 mg/dL — ABNORMAL HIGH (ref 70–99)
Glucose-Capillary: 187 mg/dL — ABNORMAL HIGH (ref 70–99)

## 2021-05-23 LAB — C-REACTIVE PROTEIN: CRP: 5.4 mg/dL — ABNORMAL HIGH (ref <1.0)

## 2021-05-23 LAB — OSMOLALITY: Osmolality: 292 mOsm/kg (ref 275–295)

## 2021-05-23 MED ORDER — INSULIN GLARGINE-YFGN 100 UNIT/ML ~~LOC~~ SOLN
25.0000 [IU] | Freq: Two times a day (BID) | SUBCUTANEOUS | Status: DC
  Administered 2021-05-23 – 2021-05-24 (×3): 25 [IU] via SUBCUTANEOUS
  Filled 2021-05-23 (×4): qty 0.25

## 2021-05-23 MED ORDER — INSULIN ASPART 100 UNIT/ML IJ SOLN
10.0000 [IU] | Freq: Three times a day (TID) | INTRAMUSCULAR | Status: DC
  Administered 2021-05-23 – 2021-05-24 (×4): 10 [IU] via SUBCUTANEOUS

## 2021-05-23 NOTE — Progress Notes (Signed)
Progress Note   Patient: Makayla [name] NFA:[id number] DOB: 06-16-1965 DOA: 05/17/2021     5 DOS: the patient was seen and examined on 05/23/2021   Brief hospital course: Patient is a 56 year old female with diabetes mellitus, CKD stage IIIb, obesity, hypothyroidism, hypertension, hyperlipidemia presented with abdominal pain.  Patient reported that she started having abdominal pain on the morning of admission after eating some ribs, severe, right upper quadrant.  No nausea vomiting or diarrhea.  Also reported mild shortness of breath, has history of chronic bronchitis.  No fevers or chills or constipation  In ED, temp 100 F, pulse 119, BP 114/74, respiratory rate 28-30, O2 sats 97 to 100% on room air Sodium 132, creatinine 1.96, lactic acidosis 3.9, procalcitonin 133.94.  COVID-19 positive.  Patient was admitted with sepsis secondary to COVID-19.  Abdominal pain due to unclear etiology.  Blood cultures positive for Bacteroides fragilis  Assessment and Plan: * Sepsis ([place]) with bacteremia- (present on admission) - Patient met severe sepsis criteria at the time of admission with tachycardia, borderline BP, lactic acidosis, acute kidney injury on CKD, lactic acidosis, possibly due to IONGE-95 and biliary colic -Lactic acid 3.9 on admission, trending down.   -Procalcitonin 133.94 on admission, improving, continue IV Rocephin and Flagyl  -Blood cultures positive for bacteroids fragilis, ID following.  Okay to switch Augmentin when ready for discharge to complete 10-day course, end date on 05/26/2021.  Outpatient follow-up in ID clinic. -Sepsis physiology resolved  Abdominal pain - Unclear etiology, CT abdomen showed cholelithiasis without cholecystitis, no pancreatitis or diverticulitis/colitis on CT.  Lipase 24.  No ovarian torsion seen on CT abdomen. -Abdominal ultrasound showed cholelithiasis without sonographic evidence for acute cholecystitis. - blood cultures+ bacteroids fragilis, continue IV  Rocephin, Flagyl.  -LFTs mildly elevated however patient has no fevers or abdominal pain, tolerating diet.  Discussed with general surgery, Dr. Donne Hazel, recommended outpatient follow-up, no urgent need for cholecystectomy inpatient. -Per ID, switch to Augmentin when ready for discharge to complete 10-day course, EOT 05/26/2021, outpatient follow-up.   COVID-19 virus infection- (present on admission) - Presented with sepsis, tachycardia, borderline BP, lactic acidosis, febrile, COVID-19 positive -Completed remdesivir, sats 97% on room air -Wheezing improved, prednisone has been tapered down to 20 mg daily  Hyponatremia- (present on admission) - Nephrology following, not SIADH, patient was placed on IV normal saline fluids, positive balance of 5.1 L -Creatinine improving however sodium still on lower side.  - Corrected sodium 126 with hyperglycemia -Patient is tolerating diet and eating and drinking well  Chronic bronchitis ([place]) with acute exacerbation - Wheezing resolved.  Transitioned to oral prednisone, tapered to 20 mg daily -Continue scheduled nebs, Brovana, Pulmicort, flutter valve    Acute renal failure superimposed on stage 3b chronic kidney disease ([place]) - Likely worsened due to severe sepsis, COVID-19, dehydration. -Patient received IV fluid hydration, positive balance of 5.0 L -Creatinine improving, 1.8  Type 2 diabetes mellitus with chronic kidney disease, with long-term current use of insulin (HCC) -CBGs uncontrolled due to steroids.  Prednisone tapered down to 20 mg  -Increase to Semglee to 25 units twice daily, NovoLog increased to 10 units 3 times daily AC, continue resistant sliding scale insulin  Hypertension associated with diabetes ([place])- (present on admission) - BP stable.  Continue to hold antihypertensives  Pressure injury of back, stage 2 ([place])- (present on admission) - Present on admission -Wound care per nursing  Hypothyroidism- (present on admission) -  Continue Synthroid  Obesity, morbid ([place])- (present on admission) - Encourage lifestyle  changes Estimated body mass index is 60.24 kg/m as calculated from the following:   Height as of this encounter: _0  (1.626 m).   Weight as of this encounter: 159.2 kg.  Hyperlipidemia associated with type 2 diabetes mellitus ([place])- (present on admission) - Continue statin    Subjective: Eating and drinking well, hoping to be discharged home.  No abdominal pain, nausea vomiting.  Physical Exam: Vitals:   05/23/21 0437 05/23/21 0500 05/23/21 0819 05/23/21 1725  BP: (!) 115/59   134/70  Pulse: 100   94  Resp: 20   18  Temp: 98.6 F (37 C)   98.8 F (37.1 C)  TempSrc:    Oral  SpO2: 100%  97%   Weight:  (!) 165.2 kg    Height:       Physical Exam General: Alert and oriented x 3, NAD Cardiovascular: S1 S2 clear, RRR. No pedal edema b/l Respiratory: CTAB, no wheezing, rales or rhonchi Gastrointestinal: Obese, soft, nontender, nondistended, NBS Ext: no pedal edema bilaterally Neuro: no new deficits    Data Reviewed: I have reviewed the labs from today, sodium 123, corrected 126, creatinine 1.8, improving from 1.99 on 2/13 CRP improving, 5.4.  Serum osmolarity 292  Family Communication: No family member at the bedside  Disposition: Status is: Inpatient Remains inpatient appropriate because: Hyponatremia, if sodium trending up ~130, will DC home hopefully next 24 hours   Planned Discharge Destination: Home   Time spent: 35 minutes  Author: Estill Cotta, MD 05/23/2021 5:48 PM  For on call review [email or web address].

## 2021-05-23 NOTE — Progress Notes (Signed)
[facility] Progress Note  Subjective: No acute events. Patient reports that she has been drinking a lot of water.  Vitals:   05/22/21 2044 05/23/21 0437 05/23/21 0500 05/23/21 0819  BP: 109/67 (!) 115/59    Pulse: (!) 105 100    Resp: 20 20    Temp: 98.6 F (37 C) 98.6 F (37 C)    TempSrc: Oral     SpO2: 99% 100%  97%  Weight:   (!) 165.2 kg   Height:        Exam: Gen alert, obese, pleasant, no distress No jvd or bruits Chest clear bilat to bases, some exp wheezing RRR no RG Abd obese, soft Ext no LE or UE edema Neuro: alert, awake, speech clear and coherent, moves all ext spontaneously    Recent Labs  Lab 05/17/21 1650 05/18/21 0500 05/19/21 0401 05/21/21 1146 05/22/21 0345 05/23/21 0406  K 4.3 4.0   < >  --  4.2 4.3  BUN 20 20   < >  --  46* 47*  CREATININE 1.96* 2.04*   < >  --  1.99* 1.83*  ALBUMIN 2.5* 2.0*  --  2.1*  --   --   CALCIUM 8.5* 7.7*   < >  --  7.6* 8.0*  HGB 11.7* 10.2*  --   --   --   --    < > = values in this interval not displayed.   Inpatient medications:  allopurinol  100 mg Oral Daily   arformoterol  15 mcg Nebulization BID   budesonide (PULMICORT) nebulizer solution  0.25 mg Nebulization BID   enoxaparin (LOVENOX) injection  0.5 mg/kg Subcutaneous Q24H   feeding supplement (GLUCERNA SHAKE)  237 mL Oral Q24H   insulin aspart  0-20 Units Subcutaneous TID WC   insulin aspart  0-5 Units Subcutaneous QHS   insulin aspart  10 Units Subcutaneous TID WC   insulin glargine-yfgn  25 Units Subcutaneous BID   ipratropium-albuterol  3 mL Nebulization BID   levothyroxine  50 mcg Oral Q0600   multivitamin with minerals  1 tablet Oral Daily   pantoprazole  40 mg Oral Daily   predniSONE  20 mg Oral QAC breakfast   Ensure Max Protein  11 oz Oral BID   rosuvastatin  10 mg Oral QHS   sodium bicarbonate  1,300 mg Oral BID   sodium chloride flush  3 mL Intravenous Q12H    cefTRIAXone (ROCEPHIN)  IV 2 g (05/22/21 2029)    metronidazole 500 mg (05/23/21 0606)   acetaminophen **OR** acetaminophen, albuterol, oxyCODONE-acetaminophen     Home meds include - allopurinol, norvasc 5, lasix 20, glipizdie, synthroid, prilosec, crestor 10, symbicort, tresiba insulin , felxeril, MVI, prns/ vits/ supps          Date                          Creat               eGFR   2009- 2017                1.42- 2.00   2019                          2.28   2021                          2.34  Dec 13-16, 2022       1.85- 2.98        18- 31 ml/min, stage IV   Apr 04, 2021             1.39                 45   Jan 4- 10, 2023         1.05- 1.54        44- >60 ml/min                           Feb 8                         1.96                 30   Feb 9                         2.04   Feb 10                       1.90   May 20, 2021             2.20                 26         CXR 2/8 - IMPRESSION: Shallow inspiration.  No evidence of active pulmonary disease.      Abd CT w/o contrast 05/17/21 - Adrenals/Urinary Tract: No urinary tract calculi or obstructive uropathy within either kidney. Adrenals are unremarkable. Bladder is decompressed, limiting its evaluation       Renal US 2/8 - R 9.8/ 9.8 cm , normal echotexture and no hydronephrosis      UA 2/8 - clear, 100 prot, 1.024, 0-5 rbc/ wbc      UNa <10, UCr 64      UOsm 205     Assessment/ Plan: AKI on CKD 3a - b/l creatinine from Jan 4-10 was 1.05- 1.54, eGFR 44- 60 ml/min.  In past years her renal function was worse w/ stage IV disease in Dec 2022. On admit creat 1.96 in setting of abdominal pain, poor po intake and acute COVID infection. No obstruction by renal US, UA negative and BP's were borderline low. AKI suspected due to borderline hypotension from sepsis/ infection and volume depletion. Rebolused 1.5L NS and continued IVF"s which is now off. Cr down to 1.8 today, no need for fluids since she is eating and drinking adequately, bicarb d/c'ed Acute on chronic hyponatremia,  hypochloremic - corrected Na around 126 today. Encouraged solute intake. Will repeat urine studies and serum osm. Added on fluid restriction of 1.5L as I do think excess fluid intake is playing a factor here Bacteroides fragilis bacteremia - with +LG fevers and WBC 15K. Blood cx's were + for bacteroides fragilis. She is getting Rocephin/ flagyl  COVID + infection -s/p remdesivir, on steroids per primary Chronic bronchitis - nebs, etc, per pmd DM2 w/ hyperglycemia - per pmd HTN - bp's soft , holding home antiHTN meds morbid obesity   Gean Quint, MD [facility]

## 2021-05-23 NOTE — Progress Notes (Signed)
Pharmacy: antibiotics  D#7/10 ceftriaxone/metronidazole for B fragilis bacteremia.  For DC: Cefdinir 300 mg po bid -stop date 2/17 Metronidazole 500 mg po bid- stop date 2/17  Eudelia Bunch, Pharm.D 05/23/2021 12:04 PM

## 2021-05-23 NOTE — Progress Notes (Signed)
Inpatient Diabetes Program Recommendations  AACE/ADA: New Consensus Statement on Inpatient Glycemic Control (2015)  Target Ranges:  Prepandial:   less than 140 mg/dL      Peak postprandial:   less than 180 mg/dL (1-2 hours)      Critically ill patients:  140 - 180 mg/dL   Lab Results  Component Value Date   GLUCAP 209 (H) 05/23/2021   HGBA1C 7.1 (H) 05/18/2021    Review of Glycemic Control  Latest Reference Range & Units 05/22/21 16:55 05/22/21 20:37 05/23/21 08:02 05/23/21 11:49  Glucose-Capillary 70 - 99 mg/dL 265 (H) 231 (H) 286 (H) 209 (H)  (H): Data is abnormally high  Diabetes history: type 2 Outpatient Diabetes medications: Tresiba 56 units BID, [place] 0.5 mg weekly, Glucotrol 5 mg BID Current orders for Inpatient glycemic control: Semglee 35 units daily, Novolog MODERATE correction scale TID & HS scale, Novolog 8 units TID Prednisone 20 mg QAM    Inpatient Diabetes Program Recommendations:   Consider further increasing Novolog to 14 units TID (Assuming patient is consuming >50% of meals) in the setting of steroids.   Thanks, Bronson Curb, MSN, RNC-OB Diabetes Coordinator [phone] (8a-5p)

## 2021-05-23 NOTE — Care Management Important Message (Signed)
Important Message  Patient Details IM Letter given to the Patient. Name: Makayla [name] MRN: [id number] Date of Birth: 1966-01-05   Medicare Important Message Given:  Yes     Kerin Salen 05/23/2021, 12:28 PM

## 2021-05-23 NOTE — Plan of Care (Signed)
°  Problem: Clinical Measurements: Goal: Diagnostic test results will improve Outcome: Progressing Goal: Respiratory complications will improve Outcome: Progressing   Problem: Safety: Goal: Ability to remain free from injury will improve Outcome: Progressing   Problem: Education: Goal: Knowledge of risk factors and measures for prevention of condition will improve Outcome: Progressing   Problem: Coping: Goal: Psychosocial and spiritual needs will be supported Outcome: Progressing   Problem: Respiratory: Goal: Will maintain a patent airway Outcome: Progressing Goal: Complications related to the disease process, condition or treatment will be avoided or minimized Outcome: Progressing

## 2021-05-24 DIAGNOSIS — A419 Sepsis, unspecified organism: Secondary | ICD-10-CM

## 2021-05-24 LAB — C-REACTIVE PROTEIN: CRP: 2.7 mg/dL — ABNORMAL HIGH (ref <1.0)

## 2021-05-24 LAB — CREATININE, SERUM
Creatinine, Ser: 1.58 mg/dL — ABNORMAL HIGH (ref 0.44–1.00)
GFR, Estimated: 39 mL/min — ABNORMAL LOW (ref >60)

## 2021-05-24 LAB — BASIC METABOLIC PANEL
Anion gap: 7 (ref 5–15)
BUN: 41 mg/dL — ABNORMAL HIGH (ref 6–20)
CO2: 25 mmol/L (ref 22–32)
Calcium: 7.9 mg/dL — ABNORMAL LOW (ref 8.9–10.3)
Chloride: 96 mmol/L — ABNORMAL LOW (ref 98–111)
Creatinine, Ser: 1.64 mg/dL — ABNORMAL HIGH (ref 0.44–1.00)
GFR, Estimated: 37 mL/min — ABNORMAL LOW (ref >60)
Glucose, Bld: 142 mg/dL — ABNORMAL HIGH (ref 70–99)
Potassium: 3.9 mmol/L (ref 3.5–5.1)
Sodium: 128 mmol/L — ABNORMAL LOW (ref 135–145)

## 2021-05-24 LAB — GLUCOSE, CAPILLARY
Glucose-Capillary: 133 mg/dL — ABNORMAL HIGH (ref 70–99)
Glucose-Capillary: 86 mg/dL (ref 70–99)
Glucose-Capillary: 51 mg/dL — ABNORMAL LOW (ref 70–99)
Glucose-Capillary: 75 mg/dL (ref 70–99)
Glucose-Capillary: 116 mg/dL — ABNORMAL HIGH (ref 70–99)

## 2021-05-24 NOTE — Evaluation (Signed)
Physical Therapy Evaluation Patient Details Name: Makayla [name] MRN: [id number] DOB: 07-04-1965 Today's Date: 05/24/2021  History of Present Illness  56 year old female with diabetes mellitus, CKD stage IIIb, obesity, neuropathy, hypothyroidism, hypertension, hyperlipidemia presented with abdominal pain.  Pt admitted 05/17/21 for Sepsis with bacteremia and also found to be Covid-19 positive.  Clinical Impression  Pt admitted with above diagnosis. Pt currently with functional limitations due to the deficits listed below (see PT Problem List). Pt will benefit from skilled PT to increase their independence and safety with mobility to allow discharge to the venue listed below.  Pt assisted with standing and transfer to recliner.  Pt with difficulty initiating standing until second person available (however was able to stand with light assist).  Pt also with saturated linen with urine and stool and pt denies knowing this.  Pt would benefit from daily mobilizing at least to bathroom or OOB to recliner with nursing during acute stay.       Recommendations for follow up therapy are one component of a multi-disciplinary discharge planning process, led by the attending physician.  Recommendations may be updated based on patient status, additional functional criteria and insurance authorization.  Follow Up Recommendations Home health PT    Assistance Recommended at Discharge Intermittent Supervision/Assistance  Patient can return home with the following  A little help with walking and/or transfers;A little help with bathing/dressing/bathroom;Assist for transportation;Help with stairs or ramp for entrance    Equipment Recommendations None recommended by PT  Recommendations for Other Services       Functional Status Assessment Patient has had a recent decline in their functional status and demonstrates the ability to make significant improvements in function in a reasonable and predictable amount of time.      Precautions / Restrictions Precautions Precautions: Fall      Mobility  Bed Mobility Overal bed mobility: Needs Assistance Bed Mobility: Supine to Sit     Supine to sit: Supervision, HOB elevated     General bed mobility comments: increased time and effort    Transfers Overall transfer level: Needs assistance Equipment used: Rolling walker (2 wheels) Transfers: Sit to/from Stand, Bed to chair/wheelchair/BSC Sit to Stand: Min assist, From elevated surface, +2 safety/equipment   Step pivot transfers: Min guard, +2 safety/equipment       General transfer comment: pt initially reporting inability to stand, provided second person for comfort/safety and pt able to stand with light assist to rise    Ambulation/Gait                  Stairs            Wheelchair Mobility    Modified Rankin (Stroke Patients Only)       Balance                                             Pertinent Vitals/Pain Pain Assessment Pain Assessment: No/denies pain    Home Living Family/patient expects to be discharged to:: Private residence Living Arrangements: Other relatives;Parent   Type of Home: Apartment Home Access: Stairs to enter Entrance Stairs-Rails: None Entrance Stairs-Number of Steps: 2   Home Layout: One level Home Equipment: Conservation officer, nature (2 wheels);Cane - single point      Prior Function Prior Level of Function : Independent/Modified Independent             Mobility Comments:  pt with recent admission and decreased mobility however was ambulatory prior to this admission       Hand Dominance        Extremity/Trunk Assessment        Lower Extremity Assessment Lower Extremity Assessment: Generalized weakness    Cervical / Trunk Assessment Cervical / Trunk Assessment: Other exceptions Cervical / Trunk Exceptions: body habitus limiting  Communication   Communication: No difficulties  Cognition Arousal/Alertness:  Awake/alert Behavior During Therapy: WFL for tasks assessed/performed Overall Cognitive Status: Within Functional Limits for tasks assessed                                 General Comments: pt crying when unable to stand with first few attempts so had +2 assist for safety and pt was able to stand with little assist        General Comments      Exercises     Assessment/Plan    PT Assessment Patient needs continued PT services  PT Problem List Decreased strength;Decreased activity tolerance;Decreased balance;Decreased mobility;Decreased knowledge of precautions;Decreased knowledge of use of DME;Obesity       PT Treatment Interventions DME instruction;Gait training;Balance training;Therapeutic exercise;Stair training;Functional mobility training;Therapeutic activities;Patient/family education    PT Goals (Current goals can be found in the Care Plan section)  Acute Rehab PT Goals PT Goal Formulation: With patient Time For Goal Achievement: 06/07/21 Potential to Achieve Goals: Good    Frequency Min 3X/week     Co-evaluation               AM-PAC PT "6 Clicks" Mobility  Outcome Measure Help needed turning from your back to your side while in a flat bed without using bedrails?: A Little Help needed moving from lying on your back to sitting on the side of a flat bed without using bedrails?: A Little Help needed moving to and from a bed to a chair (including a wheelchair)?: A Little Help needed standing up from a chair using your arms (e.g., wheelchair or bedside chair)?: A Little Help needed to walk in hospital room?: A Lot Help needed climbing 3-5 steps with a railing? : A Lot 6 Click Score: 16    End of Session   Activity Tolerance: Patient tolerated treatment well Patient left: in chair;with call bell/phone within reach Nurse Communication: Mobility status PT Visit Diagnosis: Other abnormalities of gait and mobility (R26.89)    Time: 0258-5277 PT  Time Calculation (min) (ACUTE ONLY): 38 min   Charges:   PT Evaluation $PT Eval Low Complexity: 1 Low PT Treatments $Therapeutic Activity: 8-22 mins       Jannette Spanner PT, DPT Acute Rehabilitation Services Pager: [phone] Office: [place] 05/24/2021, 1:59 PM

## 2021-05-24 NOTE — Progress Notes (Signed)
[facility] Progress Note  Subjective: No acute events. Patient reports that she has been drinking a lot of water.  Vitals:   05/23/21 2003 05/23/21 2108 05/24/21 0500 05/24/21 0848  BP: 125/71     Pulse: 98     Resp: 20     Temp: 98.5 F (36.9 C)     TempSrc: Oral     SpO2: 95% 94%  93%  Weight:   (!) 163.3 kg   Height:        Exam: Gen alert, obese, pleasant, no distress No jvd or bruits Chest clear bilat to bases, some exp wheezing RRR no RG Abd obese, soft Ext no LE or UE edema Neuro: alert, awake, speech clear and coherent, moves all ext spontaneously    Recent Labs  Lab 05/17/21 1650 05/18/21 0500 05/19/21 0401 05/21/21 1146 05/22/21 0345 05/23/21 0406 05/24/21 0414 05/24/21 0726  K 4.3 4.0   < >  --    < > 4.3  --  3.9  BUN 20 20   < >  --    < > 47*  --  41*  CREATININE 1.96* 2.04*   < >  --    < > 1.83* 1.58* 1.64*  ALBUMIN 2.5* 2.0*  --  2.1*  --   --   --   --   CALCIUM 8.5* 7.7*   < >  --    < > 8.0*  --  7.9*  HGB 11.7* 10.2*  --   --   --   --   --   --    < > = values in this interval not displayed.   Inpatient medications:  allopurinol  100 mg Oral Daily   arformoterol  15 mcg Nebulization BID   budesonide (PULMICORT) nebulizer solution  0.25 mg Nebulization BID   enoxaparin (LOVENOX) injection  0.5 mg/kg Subcutaneous Q24H   feeding supplement (GLUCERNA SHAKE)  237 mL Oral Q24H   insulin aspart  0-20 Units Subcutaneous TID WC   insulin aspart  0-5 Units Subcutaneous QHS   insulin aspart  10 Units Subcutaneous TID WC   insulin glargine-yfgn  25 Units Subcutaneous BID   ipratropium-albuterol  3 mL Nebulization BID   levothyroxine  50 mcg Oral Q0600   multivitamin with minerals  1 tablet Oral Daily   pantoprazole  40 mg Oral Daily   Ensure Max Protein  11 oz Oral BID   rosuvastatin  10 mg Oral QHS   sodium bicarbonate  1,300 mg Oral BID   sodium chloride flush  3 mL Intravenous Q12H    cefTRIAXone (ROCEPHIN)  IV Stopped  (05/23/21 1820)   metronidazole 500 mg (05/24/21 0534)   acetaminophen **OR** acetaminophen, albuterol, oxyCODONE-acetaminophen     Home meds include - allopurinol, norvasc 5, lasix 20, glipizdie, synthroid, prilosec, crestor 10, symbicort, tresiba insulin , felxeril, MVI, prns/ vits/ supps          Date                          Creat               eGFR   2009- 2017                1.42- 2.00   2019                          2.28  2021                          2.34   Dec 13-16, 2022       1.85- 2.98        18- 31 ml/min, stage IV   Apr 04, 2021             1.39                 45   Jan 4- 10, 2023         1.05- 1.54        44- >60 ml/min                           Feb 8                         1.96                 30   Feb 9                         2.04   Feb 10                       1.90   May 20, 2021             2.20                 26         CXR 2/8 - IMPRESSION: Shallow inspiration.  No evidence of active pulmonary disease.      Abd CT w/o contrast 05/17/21 - Adrenals/Urinary Tract: No urinary tract calculi or obstructive uropathy within either kidney. Adrenals are unremarkable. Bladder is decompressed, limiting its evaluation       Renal US 2/8 - R 9.8/ 9.8 cm , normal echotexture and no hydronephrosis      UA 2/8 - clear, 100 prot, 1.024, 0-5 rbc/ wbc      UNa <10, UCr 64      UOsm 205     Assessment/ Plan: AKI on CKD 3a - b/l creatinine from Jan 4-10 was 1.05- 1.54, eGFR 44- 60 ml/min.  In past years her renal function was worse w/ stage IV disease in Dec 2022. On admit creat 1.96 in setting of abdominal pain, poor po intake and acute COVID infection. No obstruction by renal US, UA negative and BP's were borderline low. AKI suspected due to borderline hypotension from sepsis/ infection and volume depletion. Rebolused 1.5L NS and continued IVF"s which is now off. Cr down to 1.6 today, no need for fluids since she is eating and drinking adequately Acute on chronic hyponatremia,  hypochloremic - Na up to 128 w/ fluid restriction. Encouraged solute intake. Will repeat urine studies and serum osm-pending. I do think excess fluid intake is playing a factor here Bacteroides fragilis bacteremia - with +LG fevers and WBC 15K. Blood cx's were + for bacteroides fragilis. She is getting Rocephin/ flagyl  COVID + infection -s/p remdesivir, on steroids per primary Chronic bronchitis - nebs, etc, per pmd DM2 w/ hyperglycemia - per pmd HTN - bp's soft , holding home antiHTN meds Metabolic acidosis secondary to AKI and hyperglycemia. On po nahco3, will d/c this. If co2 trends down, then can r/s this morbid obesity  Will sign off from  a nephrology perspective, should have labs checked about 3-4 days after discharge. If needed, can be referred to our office from there.   Gean Quint, MD [facility]

## 2021-05-24 NOTE — Progress Notes (Signed)
Progress Note   Patient: Makayla [name] IRJ:[id number] DOB: 27-Feb-1966 DOA: 05/17/2021     6 DOS: the patient was seen and examined on 05/24/2021   Brief hospital course: Mr. [name] is a 56 year old female with diabetes mellitus, CKD stage IIIb, obesity, hypothyroidism, hypertension, hyperlipidemia presented with abdominal pain.  Patient reported that she started having abdominal pain on the morning of admission after eating some ribs. No nausea vomiting or diarrhea.  Also reported mild shortness of breath, has history of chronic bronchitis.  No fevers or chills or constipation  In ED, temp 100 F, pulse 119, BP 114/74, respiratory rate 28-30, O2 sats 97 to 100% on room air.  Sodium 132, creatinine 1.96, lactic acidosis 3.9, procalcitonin 133.94.   COVID-19 positive.  Patient was admitted with sepsis secondary to COVID-19.  Abdominal pain due to unclear etiology.  Blood cultures positive for Bacteroides fragilis.   Assessment and Plan: * Sepsis ([name]) with bacteremia-resolved as of 05/24/2021, (present on admission) - Patient met severe sepsis criteria at the time of admission with tachycardia, borderline BP, lactic acidosis, acute kidney injury on CKD, lactic acidosis, possibly due to [id number] and biliary colic -Lactic acid 3.9 on admission, trending down.   -Procalcitonin 133.94 on admission, improving, continue IV Rocephin and Flagyl  -Blood cultures positive for bacteroids fragilis, ID following.  Okay to switch Augmentin when ready for discharge to complete 10-day course, end date on 05/26/2021.  Outpatient follow-up in ID clinic. -Sepsis physiology resolved  Abdominal pain - Unclear etiology, CT abdomen showed cholelithiasis without cholecystitis, no pancreatitis or diverticulitis/colitis on CT.  Lipase 24.  No ovarian torsion seen on CT abdomen. -Abdominal ultrasound showed cholelithiasis without sonographic evidence for acute cholecystitis. - blood cultures+ bacteroids fragilis. S/p IV  Rocephin, Flagyl.  -LFTs mildly elevated however patient has no fevers or abdominal pain, tolerating diet.  Discussed with general surgery, Dr. Donne Hazel, recommended outpatient follow-up, no urgent need for cholecystectomy inpatient. -Per ID, switch to Augmentin when ready for discharge to complete 10-day course, EOT 05/26/2021, outpatient follow-up.   COVID-19 virus infection-resolved as of 05/24/2021, (present on admission) - Presented with sepsis, tachycardia, borderline BP, lactic acidosis, febrile, COVID-19 positive -Completed remdesivir, sats 97% on room air -Wheezing improved, prednisone has been tapered down to 20 mg daily  Acute renal failure superimposed on stage 3b chronic kidney disease (Makayla [name]) - Likely worsened due to severe sepsis, COVID-19, dehydration. -Patient received IV fluid hydration  Hyponatremia- (present on admission) - Nephrology following, not SIADH, patient was placed on IV normal saline fluids; appetite improved - creat and Na improving - continue fluid restriction - if further improved tomorrow, will likely d/c home  Pressure injury of back, stage 2 (Makayla [name])- (present on admission) - Present on admission -Wound care per nursing  Chronic bronchitis (Makayla [name]) with acute exacerbation - Wheezing resolved.  Transitioned to oral prednisone, tapered to 20 mg daily -Continue scheduled nebs, Brovana, Pulmicort, flutter valve    Hypothyroidism- (present on admission) - Continue Synthroid  Type 2 diabetes mellitus with chronic kidney disease, with long-term current use of insulin (HCC) -CBGs uncontrolled due to steroids.  Prednisone tapered down to 20 mg  - continue semglee and SSI  Obesity, morbid (Makayla [name])- (present on admission) - Encourage lifestyle changes Estimated body mass index is 60.24 kg/m as calculated from the following:   Height as of this encounter: _0  (1.626 m).   Weight as of this encounter: 159.2 kg.  Hypertension associated with diabetes (Makayla [name])-  (present on admission) - BP stable.  Continue to hold antihypertensives  Hyperlipidemia associated with type 2 diabetes mellitus (Makayla [name])- (present on admission) - Continue statin      Subjective: No events overnight.  She is sitting in the recliner today in no distress.  She was complaining of not being able to sit all the way back in the chair or get her feet up. We discussed probable discharge home tomorrow if sodium continues to further improve.  Physical Exam: Vitals:   05/23/21 2108 05/24/21 0500 05/24/21 0848 05/24/21 1300  BP:    130/80  Pulse:    90  Resp:      Temp:    98.5 F (36.9 C)  TempSrc:    Oral  SpO2: 94%  93% 94%  Weight:  (!) 163.3 kg    Height:      Physical Exam Constitutional:      Appearance: She is obese. She is not ill-appearing.     Comments: Morbidly obese adult woman sitting in recliner in no distress  HENT:     Head: Normocephalic and atraumatic.     Mouth/Throat:     Mouth: Mucous membranes are moist.  Eyes:     Extraocular Movements: Extraocular movements intact.  Cardiovascular:     Rate and Rhythm: Normal rate and regular rhythm.  Pulmonary:     Effort: Pulmonary effort is normal.     Breath sounds: Normal breath sounds.  Abdominal:     General: There is no distension.     Palpations: Abdomen is soft.     Tenderness: There is no abdominal tenderness.  Musculoskeletal:        General: Swelling present. Normal range of motion.     Cervical back: Normal range of motion and neck supple.  Skin:    General: Skin is warm and dry.  Neurological:     General: No focal deficit present.  Psychiatric:        Mood and Affect: Mood normal.        Behavior: Behavior normal.     Data Reviewed:  Results for orders placed or performed during the hospital encounter of 05/17/21 (from the past 24 hour(s))  Glucose, capillary     Status: Abnormal   Collection Time: 05/23/21  8:07 PM  Result Value Ref Range   Glucose-Capillary 187 (H) 70 - 99  mg/dL  Creatinine, serum     Status: Abnormal   Collection Time: 05/24/21  4:14 AM  Result Value Ref Range   Creatinine, Ser 1.58 (H) 0.44 - 1.00 mg/dL   GFR, Estimated 39 (L) >60 mL/min  C-reactive protein     Status: Abnormal   Collection Time: 05/24/21  4:14 AM  Result Value Ref Range   CRP 2.7 (H) <1.0 mg/dL  Basic metabolic panel     Status: Abnormal   Collection Time: 05/24/21  7:26 AM  Result Value Ref Range   Sodium 128 (L) 135 - 145 mmol/L   Potassium 3.9 3.5 - 5.1 mmol/L   Chloride 96 (L) 98 - 111 mmol/L   CO2 25 22 - 32 mmol/L   Glucose, Bld 142 (H) 70 - 99 mg/dL   BUN 41 (H) 6 - 20 mg/dL   Creatinine, Ser 1.64 (H) 0.44 - 1.00 mg/dL   Calcium 7.9 (L) 8.9 - 10.3 mg/dL   GFR, Estimated 37 (L) >60 mL/min   Anion gap 7 5 - 15  Glucose, capillary     Status: Abnormal   Collection Time: 05/24/21  8:11 AM  Result  Value Ref Range   Glucose-Capillary 133 (H) 70 - 99 mg/dL  Glucose, capillary     Status: None   Collection Time: 05/24/21 12:58 PM  Result Value Ref Range   Glucose-Capillary 86 70 - 99 mg/dL    I have Reviewed nursing notes, Vitals, and Lab results since pt's last encounter. Pertinent lab results : see above I have ordered test including BMP, CBC, Mg I have reviewed the last note from staff over past 24 hours I have discussed pt's care plan and test results with nursing staff, case manager  Family Communication:   Disposition: Status is: Inpatient Remains inpatient appropriate because: Treatment as outlined in A&P    Planned Discharge Destination: Home      Author: Dwyane Dee, MD 05/24/2021 6:00 PM  For on call review [email or web address].

## 2021-05-24 NOTE — Progress Notes (Signed)
Assumed pt. Care from previous RN.  Agree with previous assessment.  No changes noted.  Will monitor.

## 2021-05-25 LAB — GLUCOSE, CAPILLARY
Glucose-Capillary: 121 mg/dL — ABNORMAL HIGH (ref 70–99)
Glucose-Capillary: 179 mg/dL — ABNORMAL HIGH (ref 70–99)

## 2021-05-25 LAB — CBC
HCT: 29.8 % — ABNORMAL LOW (ref 36.0–46.0)
Hemoglobin: 10 g/dL — ABNORMAL LOW (ref 12.0–15.0)
MCH: 27.5 pg (ref 26.0–34.0)
MCHC: 33.6 g/dL (ref 30.0–36.0)
MCV: 82.1 fL (ref 80.0–100.0)
Platelets: 230 10*3/uL (ref 150–400)
RBC: 3.63 MIL/uL — ABNORMAL LOW (ref 3.87–5.11)
RDW: 17.6 % — ABNORMAL HIGH (ref 11.5–15.5)
WBC: 8.7 10*3/uL (ref 4.0–10.5)
nRBC: 0 % (ref 0.0–0.2)

## 2021-05-25 LAB — BASIC METABOLIC PANEL
Anion gap: 6 (ref 5–15)
BUN: 34 mg/dL — ABNORMAL HIGH (ref 6–20)
CO2: 27 mmol/L (ref 22–32)
Calcium: 8.1 mg/dL — ABNORMAL LOW (ref 8.9–10.3)
Chloride: 98 mmol/L (ref 98–111)
Creatinine, Ser: 1.34 mg/dL — ABNORMAL HIGH (ref 0.44–1.00)
GFR, Estimated: 47 mL/min — ABNORMAL LOW (ref >60)
Glucose, Bld: 116 mg/dL — ABNORMAL HIGH (ref 70–99)
Potassium: 4.2 mmol/L (ref 3.5–5.1)
Sodium: 131 mmol/L — ABNORMAL LOW (ref 135–145)

## 2021-05-25 LAB — C-REACTIVE PROTEIN: CRP: 1.8 mg/dL — ABNORMAL HIGH (ref <1.0)

## 2021-05-25 LAB — MAGNESIUM: Magnesium: 1.8 mg/dL (ref 1.7–2.4)

## 2021-05-25 MED ORDER — SODIUM CHLORIDE 0.9 % IV SOLN
2.0000 g | Freq: Every day | INTRAVENOUS | Status: DC
  Administered 2021-05-25: 2 g via INTRAVENOUS
  Filled 2021-05-25 (×3): qty 20

## 2021-05-25 NOTE — Discharge Summary (Signed)
Physician Discharge Summary   Patient: Makayla [name] MRN: [id number] DOB: April 06, 1966  Admit date:     05/17/2021  Discharge date: 05/25/21  Discharge Physician: Dwyane Dee   PCP: Arthur Holms, NP   Recommendations at discharge:    Continue outpatient chronic medical management  Discharge Diagnoses: Active Problems:   Hyponatremia   Acute renal failure superimposed on stage 3b chronic kidney disease (HCC)   Hyperlipidemia associated with type 2 diabetes mellitus ([place])   Hypertension associated with diabetes ([place])   Obesity, morbid ([place])   Type 2 diabetes mellitus with chronic kidney disease, with long-term current use of insulin (HCC)   Hypothyroidism   Pressure injury of back, stage 2 (HCC)   Biliary colic   Bacteremia  Principal Problem (Resolved):   Sepsis ([place]) with bacteremia Resolved Problems:   COVID-19 virus infection   Abdominal pain   SIRS (systemic inflammatory response syndrome) (HCC)   Chronic bronchitis (HCC) with acute exacerbation   SIRS (systemic inflammatory response syndrome) [facility])   Hospital Course: Makayla [name] is a 56 year old female with diabetes mellitus, CKD stage IIIb, obesity, hypothyroidism, hypertension, hyperlipidemia presented with abdominal pain.  Patient reported that she started having abdominal pain on the morning of admission after eating some ribs. No nausea vomiting or diarrhea.  Also reported mild shortness of breath, has history of chronic bronchitis.  No fevers or chills or constipation  In ED, temp 100 F, pulse 119, BP 114/74, respiratory rate 28-30, O2 sats 97 to 100% on room air.  Sodium 132, creatinine 1.96, lactic acidosis 3.9, procalcitonin 133.94.   COVID-19 positive.  Patient was admitted with sepsis secondary to COVID-19.  Abdominal pain due to unclear etiology.  Blood cultures positive for Bacteroides fragilis.  See A&P.   Assessment and Plan: * Sepsis ([place]) with bacteremia-resolved as of 05/24/2021, (present on admission) -  Patient met severe sepsis criteria at the time of admission with tachycardia, borderline BP, lactic acidosis, acute kidney injury on CKD, lactic acidosis, possibly due to [id number] and biliary colic -Lactic acid 3.9 on admission, trending down.   -Procalcitonin 133.94 on admission, improving, continue IV Rocephin and Flagyl  -Blood cultures positive for bacteroids fragilis, ID following.   - course considered complete prior to discharge  Abdominal pain-resolved as of 05/25/2021 - Unclear etiology, CT abdomen showed cholelithiasis without cholecystitis, no pancreatitis or diverticulitis/colitis on CT.  Lipase 24.  No ovarian torsion seen on CT abdomen. -Abdominal ultrasound showed cholelithiasis without sonographic evidence for acute cholecystitis. - blood cultures+ bacteroids fragilis. S/p IV Rocephin, Flagyl.  -LFTs mildly elevated however patient has no fevers or abdominal pain, tolerating diet.  Discussed with general surgery, Dr. Donne Hazel, recommended outpatient follow-up, no urgent need for cholecystectomy inpatient. - abx completed prior to discharge   COVID-19 virus infection-resolved as of 05/24/2021, (present on admission) - Presented with sepsis, tachycardia, borderline BP, lactic acidosis, febrile, COVID-19 positive -Completed remdesivir, sats 97% on room air -Wheezing improved, prednisone has been tapered off  Acute renal failure superimposed on stage 3b chronic kidney disease ([place]) - Likely worsened due to severe sepsis, COVID-19, dehydration. -Patient received IV fluid hydration  Hyponatremia- (present on admission) - Nephrology following, not SIADH, patient was placed on IV normal saline fluids; appetite improved - Na 131 at discharge - Ongoing fluid restriction recommended upon discharge as well  Pressure injury of back, stage 2 ([place])- (present on admission) - Present on admission -Wound care per nursing  Hypothyroidism- (present on admission) - Continue  Synthroid  Type 2 diabetes  mellitus with chronic kidney disease, with long-term current use of insulin ([place]) - Steroids discontinued prior to discharge - Home regimen resumed - A1c 7.1%  Obesity, morbid ([place])- (present on admission) - Encourage lifestyle changes Estimated body mass index is 60.24 kg/m as calculated from the following:   Height as of this encounter: $RemoveBeforeD'5\' 4"'[id number]$  (1.626 m).   Weight as of this encounter: 159.2 kg.  Hypertension associated with diabetes ([place])- (present on admission) - Home regimen resumed at discharge  Hyperlipidemia associated with type 2 diabetes mellitus ([place])- (present on admission) - Continue statin  Chronic bronchitis ([place]) with acute exacerbation-resolved as of 05/25/2021 - Wheezing resolved.  Completed steroid course  - resume home regimen           Consultants: ID Procedures performed:   Disposition: Home Diet recommendation:  Discharge Diet Orders (From admission, onward)     Start     Ordered   05/25/21 0000  Diet - low sodium heart healthy        05/25/21 1315   05/25/21 0000  Diet Carb Modified        05/25/21 1315            DISCHARGE MEDICATION: Allergies as of 05/25/2021       Reactions   Penicillins Shortness Of Breath   Inflates bronchitis Tolerates Keflex, Rocephin   Ibuprofen Other (See Comments)   Can not take due to kidney problems.    Peach Flavor Swelling   Peaches.    Strawberry Extract Swelling        Medication List     TAKE these medications    acetaminophen 325 MG tablet Commonly known as: TYLENOL Take 650 mg by mouth every 6 (six) hours as needed for moderate pain.   albuterol 108 (90 Base) MCG/ACT inhaler Commonly known as: VENTOLIN HFA Inhale 2 puffs into the lungs every 6 (six) hours as needed for wheezing or shortness of breath. For shortness of breath   allopurinol 100 MG tablet Commonly known as: ZYLOPRIM Take 100 mg by mouth 2 (two) times daily.   amLODipine 5 MG  tablet Commonly known as: NORVASC Take 1 tablet by mouth daily.   B-D UF III MINI PEN NEEDLES 31G X 5 MM Misc Generic drug: Insulin Pen Needle See admin instructions.   cyclobenzaprine 5 MG tablet Commonly known as: FLEXERIL Take 1 tablet twice a day as needed for Muscle spasms.   furosemide 20 MG tablet Commonly known as: LASIX Take 20 mg by mouth daily.   glipiZIDE 5 MG tablet Commonly known as: GLUCOTROL Take 5 mg by mouth 2 (two) times daily before a meal.   glucose blood test strip Commonly known as: OneTouch Verio Use as instructed to check blood sugar 2 times daily Dx code E11.9   Insulin Syringe-Needle U-100 29G X 1/2" 0.3 ML Misc Commonly known as: SAFETY-GLIDE 0.3CC SYR 29GX1/2 Use as directed.   levothyroxine 50 MCG tablet Commonly known as: SYNTHROID Take 50 mcg by mouth daily before breakfast.   Mounjaro 2.5 MG/0.5ML Pen Generic drug: tirzepatide Inject 0.5 mLs into the skin once a week. Monday   multivitamin with minerals Tabs tablet Take 1 tablet by mouth daily.   omeprazole 20 MG capsule Commonly known as: PRILOSEC Take 20 mg by mouth daily before breakfast.   psyllium 58.6 % packet Commonly known as: METAMUCIL Take 1 packet by mouth daily as needed (diarrhea).   rosuvastatin 10 MG tablet Commonly known as: CRESTOR Take 10 mg by mouth  at bedtime.   Symbicort 160-4.5 MCG/ACT inhaler Generic drug: budesonide-formoterol Inhale 2 puffs into the lungs 2 (two) times daily.   Tyler Aas FlexTouch 200 UNIT/ML FlexTouch Pen Generic drug: insulin degludec Inject 56 Units into the skin in the morning and at bedtime.         Discharge Exam: Filed Weights   05/23/21 0500 05/24/21 0500 05/25/21 0500  Weight: (!) 165.2 kg (!) 163.3 kg (!) 167.6 kg   Physical Exam Constitutional:      Appearance: She is obese. She is not ill-appearing.     Comments: Morbidly obese adult woman sitting in recliner in no distress  HENT:     Head: Normocephalic and  atraumatic.     Mouth/Throat:     Mouth: Mucous membranes are moist.  Eyes:     Extraocular Movements: Extraocular movements intact.  Cardiovascular:     Rate and Rhythm: Normal rate and regular rhythm.  Pulmonary:     Effort: Pulmonary effort is normal.     Breath sounds: Normal breath sounds.  Abdominal:     General: There is no distension.     Palpations: Abdomen is soft.     Tenderness: There is no abdominal tenderness.  Musculoskeletal:        General: Swelling present. Normal range of motion.     Cervical back: Normal range of motion and neck supple.  Skin:    General: Skin is warm and dry.  Neurological:     General: No focal deficit present.  Psychiatric:        Mood and Affect: Mood normal.        Behavior: Behavior normal.     Condition at discharge: stable  The results of significant diagnostics from this hospitalization (including imaging, microbiology, ancillary and laboratory) are listed below for reference.   Imaging Studies: CT ABDOMEN PELVIS WO CONTRAST  Result Date: 05/17/2021 CLINICAL DATA:  Epigastric and right upper abdominal pain EXAM: CT ABDOMEN AND PELVIS WITHOUT CONTRAST TECHNIQUE: Multidetector CT imaging of the abdomen and pelvis was performed following the standard protocol without IV contrast. RADIATION DOSE REDUCTION: This exam was performed according to the departmental dose-optimization program which includes automated exposure control, adjustment of the mA and/or kV according to patient size and/or use of iterative reconstruction technique. COMPARISON:  05/17/2021, 03/21/2021 FINDINGS: Lower chest: Scattered hypoventilatory changes at the lung bases. No acute pleural or parenchymal lung disease. Hepatobiliary: Calcified gallstones layer dependently in the gallbladder. No evidence of acute cholecystitis. Unremarkable unenhanced appearance of the liver. Pancreas: Unremarkable unenhanced appearance. Spleen: Unremarkable unenhanced appearance.  Adrenals/Urinary Tract: No urinary tract calculi or obstructive uropathy within either kidney. Adrenals are unremarkable. Bladder is decompressed, limiting its evaluation. Stomach/Bowel: No bowel obstruction or ileus. No bowel wall thickening or inflammatory change. Vascular/Lymphatic: No significant vascular findings are present. No enlarged abdominal or pelvic lymph nodes. Reproductive: Fat containing left ovarian mass measuring up to 13.3 x 9.2 cm, consistent with dermoid. Right ovary is unremarkable. Stable uterine calcifications consistent with fibroids. Other: No free fluid or free gas.  No abdominal wall hernia. Musculoskeletal: No acute or destructive bony lesions. Reconstructed images demonstrate no additional findings. IMPRESSION: 1. Cholelithiasis without cholecystitis. 2. 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 3. Fibroid uterus. Electronically Signed   By: Randa Ngo M.D.   On: 05/17/2021 22:51   US Abdomen Complete  Result Date: 05/17/2021 CLINICAL DATA:  Right upper quadrant pain EXAM: ABDOMEN ULTRASOUND COMPLETE COMPARISON:  CT 03/21/2021  FINDINGS: Gallbladder: Multiple small shadowing stones and gallbladder sludge. Normal wall thickness. Negative sonographic Murphy. Common bile duct: Diameter: 6 mm Liver: Diffusely echogenic. No focal hepatic abnormality. Portal vein is patent on color Doppler imaging with normal direction of blood flow towards the liver. IVC: No abnormality visualized. Pancreas: Visualized portion unremarkable. Spleen: Size and appearance within normal limits. Right Kidney: Length: 9.8 cm. Echogenicity within normal limits. Mild upper pole hydronephrosis but no pelvic dilatation. Left Kidney: Length: 9.8 cm. Echogenicity within normal limits. No mass or hydronephrosis visualized. Abdominal aorta: No aneurysm visualized. Other findings: None. IMPRESSION: 1. Cholelithiasis without sonographic evidence for acute cholecystitis. 2.  Echogenic liver consistent with hepatic steatosis 3. Mild upper pole caliectasis on the right without renal pelvis dilatation. Electronically Signed   By: Donavan Foil M.D.   On: 05/17/2021 19:31   DG Chest Portable 1 View  Result Date: 05/17/2021 CLINICAL DATA:  Abdominal pain.  History of ovarian cysts. EXAM: PORTABLE CHEST 1 VIEW COMPARISON:  03/21/2021 FINDINGS: Shallow inspiration. Heart size and pulmonary vascularity are normal. Increased density of the left chest likely is due to overlying soft tissue attenuation and patient rotation. No definite consolidation or effusion. No pneumothorax. IMPRESSION: Shallow inspiration.  No evidence of active pulmonary disease. Electronically Signed   By: Lucienne Capers M.D.   On: 05/17/2021 19:42    Microbiology: Results for orders placed or performed during the hospital encounter of 05/17/21  Resp Panel by RT-PCR (Flu A&B, Covid) Nasopharyngeal Swab     Status: Abnormal   Collection Time: 05/17/21  4:50 PM   Specimen: Nasopharyngeal Swab; Nasopharyngeal(NP) swabs in vial transport medium  Result Value Ref Range Status   SARS Coronavirus 2 by RT PCR POSITIVE (A) NEGATIVE Final    Comment: (NOTE) SARS-CoV-2 target nucleic acids are DETECTED.  The SARS-CoV-2 RNA is generally detectable in upper respiratory specimens during the acute phase of infection. Positive results are indicative of the presence of the identified virus, but do not rule out bacterial infection or co-infection with other pathogens not detected by the test. Clinical correlation with patient history and other diagnostic information is necessary to determine patient infection status. The expected result is Negative.  Fact Sheet for Patients: EntrepreneurPulse.com.au  Fact Sheet for Healthcare Providers: IncredibleEmployment.be  This test is not yet approved or cleared by the [place] FDA and  has been authorized for detection and/or  diagnosis of SARS-CoV-2 by FDA under an Emergency Use Authorization (EUA).  This EUA will remain in effect (meaning this test can be used) for the duration of  the COVID-19 declaration under Section 564(b)(1) of the A ct, 21 U.S.C. section 360bbb-3(b)(1), unless the authorization is terminated or revoked sooner.     Influenza A by PCR NEGATIVE NEGATIVE Final   Influenza B by PCR NEGATIVE NEGATIVE Final    Comment: (NOTE) The Xpert Xpress SARS-CoV-2/FLU/RSV plus assay is intended as an aid in the diagnosis of influenza from Nasopharyngeal swab specimens and should not be used as a sole basis for treatment. Nasal washings and aspirates are unacceptable for Xpert Xpress SARS-CoV-2/FLU/RSV testing.  Fact Sheet for Patients: EntrepreneurPulse.com.au  Fact Sheet for Healthcare Providers: IncredibleEmployment.be  This test is not yet approved or cleared by the [place] FDA and has been authorized for detection and/or diagnosis of SARS-CoV-2 by FDA under an Emergency Use Authorization (EUA). This EUA will remain in effect (meaning this test can be used) for the duration of the COVID-19 declaration under Section 564(b)(1) of the Act, 21  U.S.C. section 360bbb-3(b)(1), unless the authorization is terminated or revoked.  Performed at [facility], [place] [address] [place], [place] [address]   Blood culture (routine x 2)     Status: Abnormal   Collection Time: 05/17/21  4:50 PM   Specimen: BLOOD  Result Value Ref Range Status   Specimen Description   Final    BLOOD LEFT ANTECUBITAL Performed at [facility] [address], [place], [place] [address]    Special Requests   Final    BOTTLES DRAWN AEROBIC AND ANAEROBIC Blood Culture results may not be optimal due to an inadequate volume of blood received in culture bottles Performed at [facility] [address], [place], [place] [address]     Culture  Setup Time   Final    GRAM NEGATIVE RODS ANAEROBIC BOTTLE ONLY CRITICAL VALUE NOTED.  VALUE IS CONSISTENT WITH PREVIOUSLY REPORTED AND CALLED VALUE.    Culture (A)  Final    BACTEROIDES FRAGILIS BETA LACTAMASE POSITIVE Performed at [facility], [place] [address], [place], [place] [address]    Report Status 05/21/2021 FINAL  Final  Blood culture (routine x 2)     Status: Abnormal   Collection Time: 05/17/21  4:50 PM   Specimen: BLOOD  Result Value Ref Range Status   Specimen Description   Final    BLOOD RIGHT ANTECUBITAL Performed at [facility] [address], [place], [place] [address]    Special Requests   Final    BOTTLES DRAWN AEROBIC AND ANAEROBIC Blood Culture results may not be optimal due to an inadequate volume of blood received in culture bottles Performed at [facility] [address], [place], [place] [address]    Culture  Setup Time   Final    GRAM NEGATIVE RODS ANAEROBIC BOTTLE ONLY CRITICAL RESULT CALLED TO, READ BACK BY AND VERIFIED WITH: PHARMD MICHELLE BELL 05/19/21@6 :21 BY TW    Culture (A)  Final    BACTEROIDES FRAGILIS BETA LACTAMASE POSITIVE Performed at [facility], [place] [address], [place], [place] [address]    Report Status 05/21/2021 FINAL  Final  Blood Culture ID Panel (Reflexed)     Status: Abnormal   Collection Time: 05/17/21  4:50 PM  Result Value Ref Range Status   Enterococcus faecalis NOT DETECTED NOT DETECTED Final   Enterococcus Faecium NOT DETECTED NOT DETECTED Final   Listeria monocytogenes NOT DETECTED NOT DETECTED Final   Staphylococcus species NOT DETECTED NOT DETECTED Final   Staphylococcus aureus (BCID) NOT DETECTED NOT DETECTED Final   Staphylococcus epidermidis NOT DETECTED NOT DETECTED Final   Staphylococcus lugdunensis NOT DETECTED NOT DETECTED Final   Streptococcus species NOT DETECTED NOT DETECTED Final   Streptococcus agalactiae NOT DETECTED NOT DETECTED Final    Streptococcus pneumoniae NOT DETECTED NOT DETECTED Final   Streptococcus pyogenes NOT DETECTED NOT DETECTED Final   A.calcoaceticus-baumannii NOT DETECTED NOT DETECTED Final   Bacteroides fragilis DETECTED (A) NOT DETECTED Final    Comment: CRITICAL RESULT CALLED TO, READ BACK BY AND VERIFIED WITH: PHARMD MICHELLE BELL 05/19/21@6 :21 BY TW    Enterobacterales NOT DETECTED NOT DETECTED Final   Enterobacter cloacae complex NOT DETECTED NOT DETECTED Final   Escherichia coli NOT DETECTED NOT DETECTED Final   Klebsiella aerogenes NOT DETECTED NOT DETECTED Final   Klebsiella oxytoca NOT DETECTED NOT DETECTED Final   Klebsiella pneumoniae NOT DETECTED NOT DETECTED Final   Proteus species NOT DETECTED NOT DETECTED  Final   Salmonella species NOT DETECTED NOT DETECTED Final   Serratia marcescens NOT DETECTED NOT DETECTED Final   Haemophilus influenzae NOT DETECTED NOT DETECTED Final   Neisseria meningitidis NOT DETECTED NOT DETECTED Final   Pseudomonas aeruginosa NOT DETECTED NOT DETECTED Final   Stenotrophomonas maltophilia NOT DETECTED NOT DETECTED Final   Candida albicans NOT DETECTED NOT DETECTED Final   Candida auris NOT DETECTED NOT DETECTED Final   Candida glabrata NOT DETECTED NOT DETECTED Final   Candida krusei NOT DETECTED NOT DETECTED Final   Candida parapsilosis NOT DETECTED NOT DETECTED Final   Candida tropicalis NOT DETECTED NOT DETECTED Final   Cryptococcus neoformans/gattii NOT DETECTED NOT DETECTED Final    Comment: Performed at [facility], [address], [place], [place] [address]    Labs: CBC: Recent Labs  Lab 05/25/21 0344  WBC 8.7  HGB 10.0*  HCT 29.8*  MCV 82.1  PLT 791   Basic Metabolic Panel: Recent Labs  Lab 05/21/21 0403 05/22/21 0345 05/23/21 0406 05/24/21 0414 05/24/21 0726 05/25/21 0344  NA 125* 123* 123*  --  128* 131*  K 4.2 4.2 4.3  --  3.9 4.2  CL 95* 94* 92*  --  96* 98  CO2 18* 20* 22  --  25 27  GLUCOSE 222* 311* 291*  --  142*  116*  BUN 41* 46* 47*  --  41* 34*  CREATININE 2.07* 1.99* 1.83* 1.58* 1.64* 1.34*  CALCIUM 7.5* 7.6* 8.0*  --  7.9* 8.1*  MG  --   --   --   --   --  1.8   Liver Function Tests: Recent Labs  Lab 05/21/21 1146  AST 48*  ALT 18  ALKPHOS 153*  BILITOT 0.6  PROT 6.6  ALBUMIN 2.1*   CBG: Recent Labs  Lab 05/24/21 1819 05/24/21 1903 05/24/21 2021 05/25/21 0754 05/25/21 1136  GLUCAP 51* 75 116* 121* 179*    Discharge time spent: greater than 30 minutes.  Signed: Dwyane Dee, MD [facility] 05/25/2021

## 2021-05-25 NOTE — Progress Notes (Signed)
AVS reviewed with patient.  Patient dressed and all belongings at bedside with pt.  Sister [name] called and updated that patient is ready for pick up. Sister states she will be there in 1 hour. Phone number given to [name] to call this RN when she has arrived.

## 2021-05-31 ENCOUNTER — Telehealth: Payer: Self-pay

## 2021-05-31 NOTE — Telephone Encounter (Signed)
Patient sister called wanting to reschedule appointment on 2/24 at 3 pm for hospital follow up due patient being unable to get out of bed. They were agreeable to a virtual visit. If you would like patient to reschedule to office visit at a later date. I can call her back.   [place], CMA

## 2021-06-01 ENCOUNTER — Inpatient Hospital Stay (HOSPITAL_COMMUNITY)
Admission: EM | Admit: 2021-06-01 | Discharge: 2021-06-07 | DRG: 638 | Disposition: A | Discharge status: Skilled Nursing Facility | Payer: Medicare Other | Attending: Family Medicine | Admitting: Family Medicine

## 2021-06-01 ENCOUNTER — Emergency Department (HOSPITAL_COMMUNITY): Payer: Medicare Other

## 2021-06-01 ENCOUNTER — Encounter (HOSPITAL_COMMUNITY): Payer: Self-pay | Admitting: Emergency Medicine

## 2021-06-01 ENCOUNTER — Observation Stay (HOSPITAL_COMMUNITY): Payer: Medicare Other

## 2021-06-01 ENCOUNTER — Other Ambulatory Visit: Payer: Self-pay

## 2021-06-01 DIAGNOSIS — Z83438 Family history of other disorder of lipoprotein metabolism and other lipidemia: Secondary | ICD-10-CM

## 2021-06-01 DIAGNOSIS — Z888 Allergy status to other drugs, medicaments and biological substances status: Secondary | ICD-10-CM

## 2021-06-01 DIAGNOSIS — E1165 Type 2 diabetes mellitus with hyperglycemia: Secondary | ICD-10-CM | POA: Diagnosis not present

## 2021-06-01 DIAGNOSIS — E1140 Type 2 diabetes mellitus with diabetic neuropathy, unspecified: Secondary | ICD-10-CM | POA: Diagnosis present

## 2021-06-01 DIAGNOSIS — G8929 Other chronic pain: Secondary | ICD-10-CM | POA: Diagnosis present

## 2021-06-01 DIAGNOSIS — Z8249 Family history of ischemic heart disease and other diseases of the circulatory system: Secondary | ICD-10-CM

## 2021-06-01 DIAGNOSIS — H8110 Benign paroxysmal vertigo, unspecified ear: Secondary | ICD-10-CM | POA: Diagnosis present

## 2021-06-01 DIAGNOSIS — Z794 Long term (current) use of insulin: Secondary | ICD-10-CM

## 2021-06-01 DIAGNOSIS — E039 Hypothyroidism, unspecified: Secondary | ICD-10-CM | POA: Diagnosis present

## 2021-06-01 DIAGNOSIS — Z833 Family history of diabetes mellitus: Secondary | ICD-10-CM

## 2021-06-01 DIAGNOSIS — L89322 Pressure ulcer of left buttock, stage 2: Secondary | ICD-10-CM | POA: Diagnosis present

## 2021-06-01 DIAGNOSIS — L89892 Pressure ulcer of other site, stage 2: Secondary | ICD-10-CM | POA: Diagnosis present

## 2021-06-01 DIAGNOSIS — R270 Ataxia, unspecified: Secondary | ICD-10-CM

## 2021-06-01 DIAGNOSIS — Z880 Allergy status to penicillin: Secondary | ICD-10-CM

## 2021-06-01 DIAGNOSIS — E785 Hyperlipidemia, unspecified: Secondary | ICD-10-CM | POA: Diagnosis present

## 2021-06-01 DIAGNOSIS — Z20822 Contact with and (suspected) exposure to covid-19: Secondary | ICD-10-CM | POA: Diagnosis present

## 2021-06-01 DIAGNOSIS — E1169 Type 2 diabetes mellitus with other specified complication: Secondary | ICD-10-CM | POA: Diagnosis present

## 2021-06-01 DIAGNOSIS — W19XXXA Unspecified fall, initial encounter: Secondary | ICD-10-CM

## 2021-06-01 DIAGNOSIS — Z79890 Hormone replacement therapy (postmenopausal): Secondary | ICD-10-CM

## 2021-06-01 DIAGNOSIS — N1832 Chronic kidney disease, stage 3b: Secondary | ICD-10-CM | POA: Diagnosis present

## 2021-06-01 DIAGNOSIS — E1122 Type 2 diabetes mellitus with diabetic chronic kidney disease: Secondary | ICD-10-CM

## 2021-06-01 DIAGNOSIS — E162 Hypoglycemia, unspecified: Secondary | ICD-10-CM | POA: Diagnosis present

## 2021-06-01 DIAGNOSIS — E11649 Type 2 diabetes mellitus with hypoglycemia without coma: Secondary | ICD-10-CM | POA: Diagnosis not present

## 2021-06-01 DIAGNOSIS — Z6841 Body Mass Index (BMI) 40.0 and over, adult: Secondary | ICD-10-CM

## 2021-06-01 DIAGNOSIS — Z79899 Other long term (current) drug therapy: Secondary | ICD-10-CM

## 2021-06-01 DIAGNOSIS — L8990 Pressure ulcer of unspecified site, unspecified stage: Secondary | ICD-10-CM | POA: Diagnosis present

## 2021-06-01 DIAGNOSIS — I129 Hypertensive chronic kidney disease with stage 1 through stage 4 chronic kidney disease, or unspecified chronic kidney disease: Secondary | ICD-10-CM | POA: Diagnosis present

## 2021-06-01 DIAGNOSIS — Z8616 Personal history of COVID-19: Secondary | ICD-10-CM

## 2021-06-01 DIAGNOSIS — R296 Repeated falls: Secondary | ICD-10-CM | POA: Diagnosis present

## 2021-06-01 DIAGNOSIS — Z7984 Long term (current) use of oral hypoglycemic drugs: Secondary | ICD-10-CM

## 2021-06-01 DIAGNOSIS — Z87891 Personal history of nicotine dependence: Secondary | ICD-10-CM

## 2021-06-01 DIAGNOSIS — H5509 Other forms of nystagmus: Secondary | ICD-10-CM | POA: Diagnosis present

## 2021-06-01 DIAGNOSIS — Z886 Allergy status to analgesic agent status: Secondary | ICD-10-CM

## 2021-06-01 LAB — COMPREHENSIVE METABOLIC PANEL
ALT: 29 U/L (ref 0–44)
AST: 50 U/L — ABNORMAL HIGH (ref 15–41)
Albumin: 2.8 g/dL — ABNORMAL LOW (ref 3.5–5.0)
Alkaline Phosphatase: 107 U/L (ref 38–126)
Anion gap: 7 (ref 5–15)
BUN: 10 mg/dL (ref 6–20)
CO2: 23 mmol/L (ref 22–32)
Calcium: 8.5 mg/dL — ABNORMAL LOW (ref 8.9–10.3)
Chloride: 102 mmol/L (ref 98–111)
Creatinine, Ser: 1.1 mg/dL — ABNORMAL HIGH (ref 0.44–1.00)
GFR, Estimated: >60 mL/min (ref >60)
Glucose, Bld: 65 mg/dL — ABNORMAL LOW (ref 70–99)
Potassium: 4.3 mmol/L (ref 3.5–5.1)
Sodium: 132 mmol/L — ABNORMAL LOW (ref 135–145)
Total Bilirubin: 0.6 mg/dL (ref 0.3–1.2)
Total Protein: 7.3 g/dL (ref 6.5–8.1)

## 2021-06-01 LAB — LIPASE, BLOOD: Lipase: 55 U/L — ABNORMAL HIGH (ref 11–51)

## 2021-06-01 LAB — CBC WITH DIFFERENTIAL/PLATELET
Abs Immature Granulocytes: 0.05 10*3/uL (ref 0.00–0.07)
Basophils Absolute: 0 10*3/uL (ref 0.0–0.1)
Basophils Relative: 1 %
Eosinophils Absolute: 0 10*3/uL (ref 0.0–0.5)
Eosinophils Relative: 1 %
HCT: 35.1 % — ABNORMAL LOW (ref 36.0–46.0)
Hemoglobin: 11.1 g/dL — ABNORMAL LOW (ref 12.0–15.0)
Immature Granulocytes: 1 %
Lymphocytes Relative: 27 %
Lymphs Abs: 1.8 10*3/uL (ref 0.7–4.0)
MCH: 27.1 pg (ref 26.0–34.0)
MCHC: 31.6 g/dL (ref 30.0–36.0)
MCV: 85.8 fL (ref 80.0–100.0)
Monocytes Absolute: 1.1 10*3/uL — ABNORMAL HIGH (ref 0.1–1.0)
Monocytes Relative: 17 %
Neutro Abs: 3.6 10*3/uL (ref 1.7–7.7)
Neutrophils Relative %: 53 %
Platelets: 221 10*3/uL (ref 150–400)
RBC: 4.09 MIL/uL (ref 3.87–5.11)
RDW: 18.1 % — ABNORMAL HIGH (ref 11.5–15.5)
WBC: 6.6 10*3/uL (ref 4.0–10.5)
nRBC: 0 % (ref 0.0–0.2)

## 2021-06-01 LAB — TSH: TSH: 3.03 u[IU]/mL (ref 0.350–4.500)

## 2021-06-01 LAB — BLOOD GAS, VENOUS
Acid-Base Excess: 0.3 mmol/L (ref 0.0–2.0)
Bicarbonate: 25.4 mmol/L (ref 20.0–28.0)
O2 Saturation: 60.3 %
Patient temperature: 37
pCO2, Ven: 42 mmHg — ABNORMAL LOW (ref 44–60)
pH, Ven: 7.39 (ref 7.25–7.43)
pO2, Ven: 36 mmHg (ref 32–45)

## 2021-06-01 LAB — CBG MONITORING, ED
Glucose-Capillary: 66 mg/dL — ABNORMAL LOW (ref 70–99)
Glucose-Capillary: 67 mg/dL — ABNORMAL LOW (ref 70–99)
Glucose-Capillary: 36 mg/dL — CL (ref 70–99)
Glucose-Capillary: 48 mg/dL — ABNORMAL LOW (ref 70–99)
Glucose-Capillary: 60 mg/dL — ABNORMAL LOW (ref 70–99)
Glucose-Capillary: 85 mg/dL (ref 70–99)

## 2021-06-01 LAB — BRAIN NATRIURETIC PEPTIDE: B Natriuretic Peptide: 125.3 pg/mL — ABNORMAL HIGH (ref 0.0–100.0)

## 2021-06-01 LAB — GLUCOSE, CAPILLARY
Glucose-Capillary: 59 mg/dL — ABNORMAL LOW (ref 70–99)
Glucose-Capillary: 88 mg/dL (ref 70–99)

## 2021-06-01 LAB — T4, FREE: Free T4: 1.16 ng/dL — ABNORMAL HIGH (ref 0.61–1.12)

## 2021-06-01 LAB — ETHANOL: Alcohol, Ethyl (B): <10 mg/dL (ref <10)

## 2021-06-01 IMAGING — DX DG CHEST 1V PORT
1 series · 1 of 1 positions shown · non-contrast
Comparison: [DATE]

CLINICAL DATA: Hypoglycemia, fall

EXAM:
PORTABLE CHEST 1 VIEW

[chest ap]
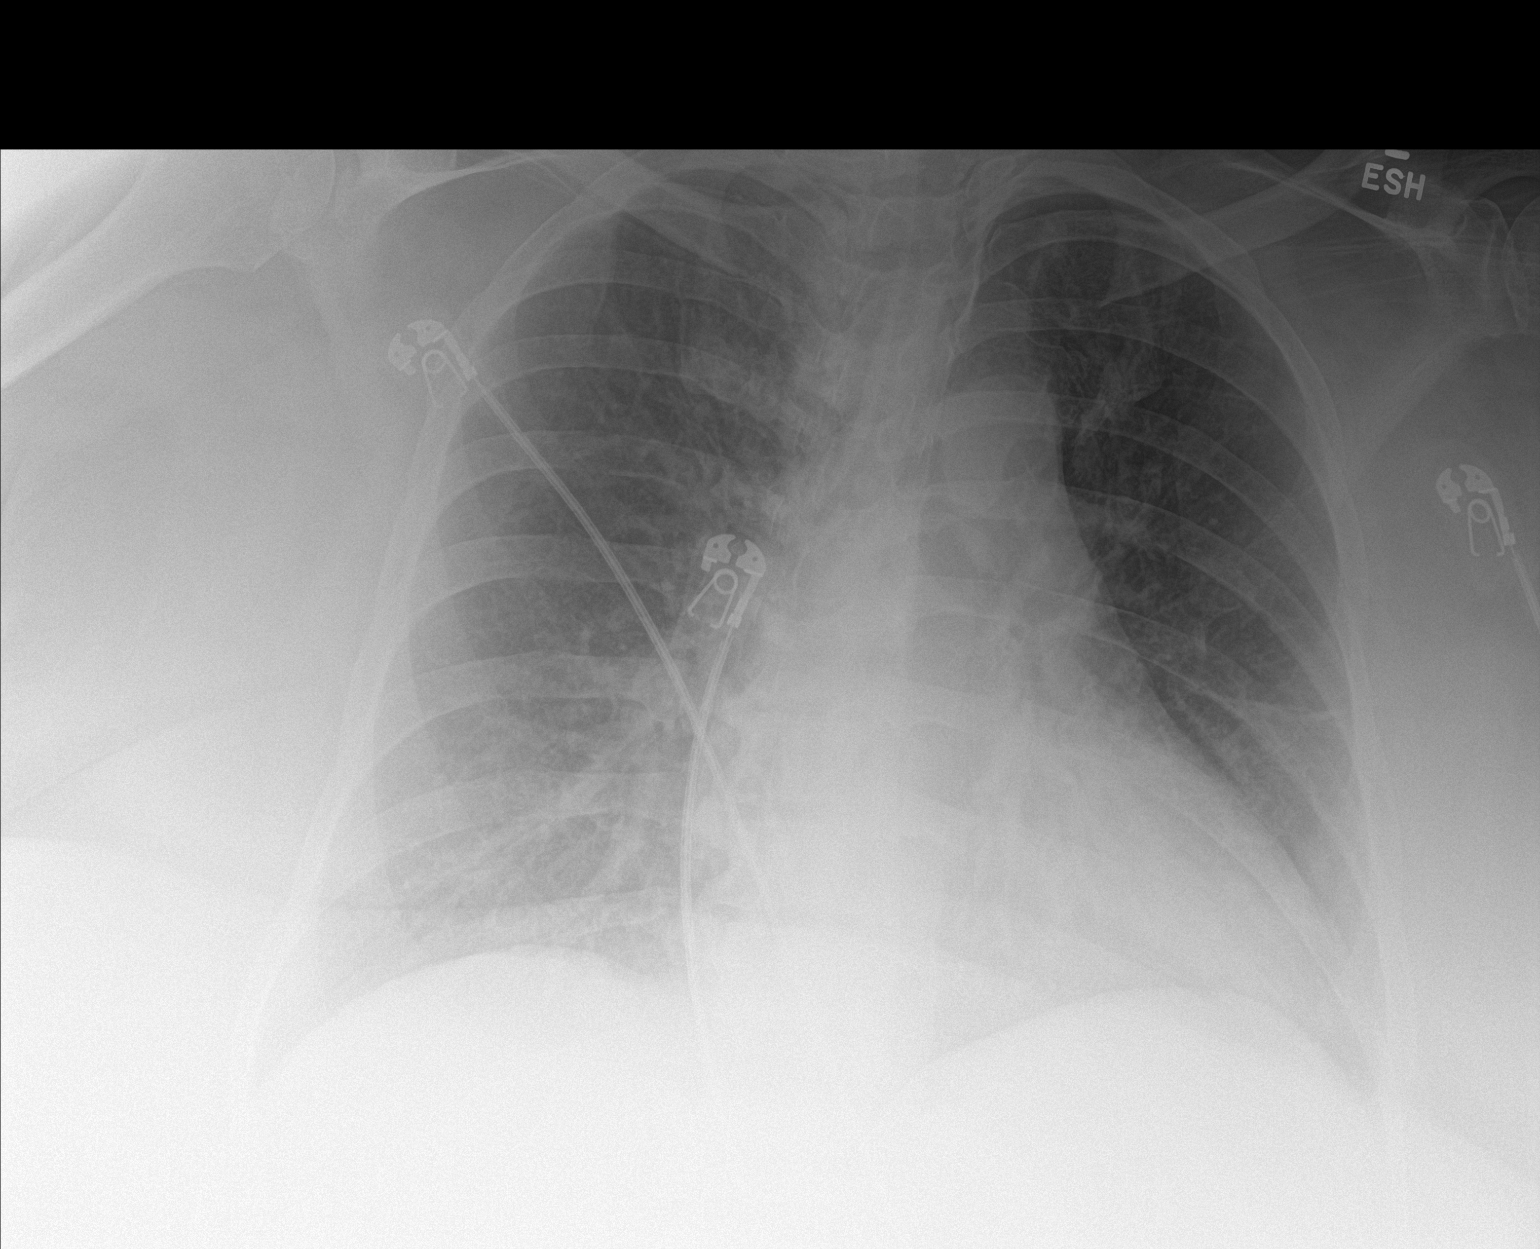

[1 of 1 positions shown; findings below may reference images not displayed]

FINDINGS: Limited exam secondary to poor penetration related to patient body
habitus. The heart size and mediastinal contours are within normal
limits. No focal airspace consolidation, pleural effusion, or
pneumothorax. The visualized skeletal structures are unremarkable.
IMPRESSION: No active disease.

## 2021-06-01 IMAGING — MR MR HEAD W/O CM
10 series · 42 of 48 positions shown · non-contrast
Comparison: [DATE] MRI head, correlation is also made with
[DATE] CT head

CLINICAL DATA: Dizziness, fall

EXAM:
MRI HEAD WITHOUT CONTRAST
TECHNIQUE: Multiplanar, multiecho pulse sequences of the brain and surrounding
structures were obtained without intravenous contrast.

[Series 5: dwi_tracew · axial · 3.0mm · 1.08mm/px · z∈[-68,+71]mm · 8 of 96 slices shown]
[im 1/96]
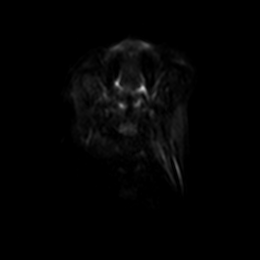
[im 20/96]
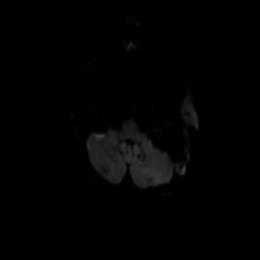
[im 29/96]
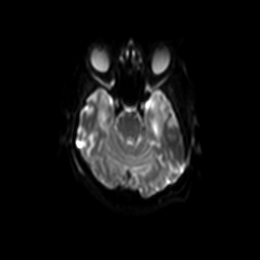
[im 39/96]
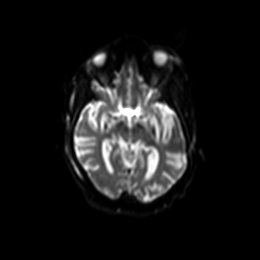
[im 58/96]
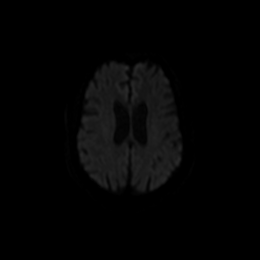
[im 67/96]
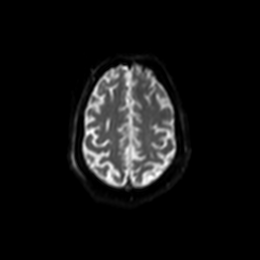
[im 77/96]
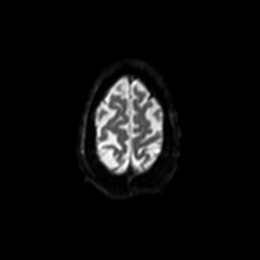
[im 96/96]
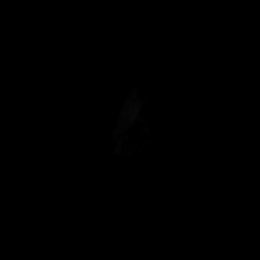

[Series 6: dwi_adc · axial · 3.0mm · 1.08mm/px · z∈[-68,-35]mm · 2 of 48 slices shown]
[im 1/48]
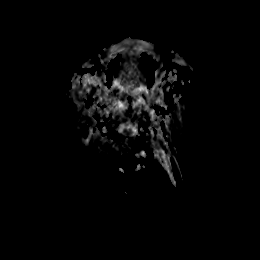
[im 12/48]
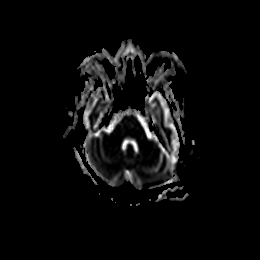

[Series 7: T2 · sagittal · 5.0mm · 0.47mm/px · 3 of 24 slices shown (1 of 3)]
[im 1/24]
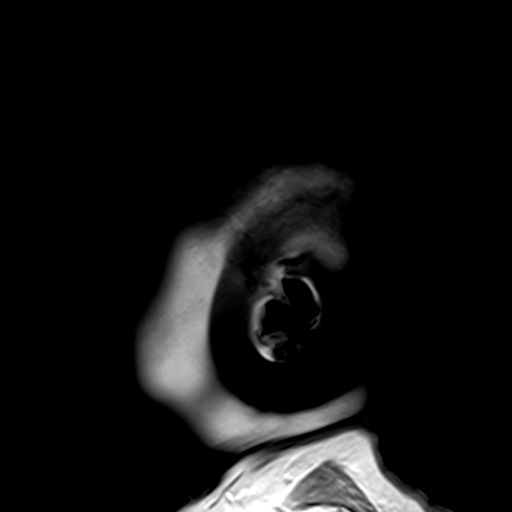
[im 12/24]
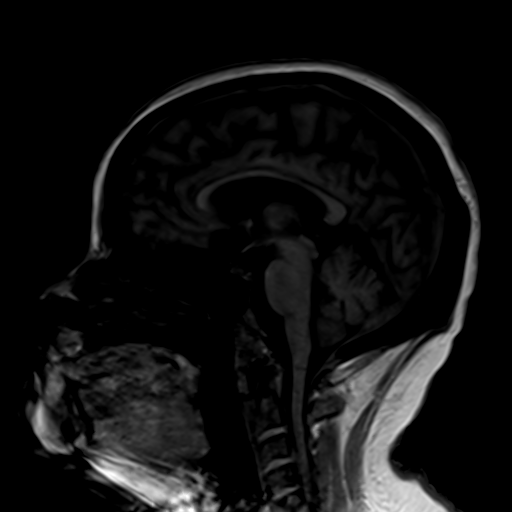
[im 24/24]
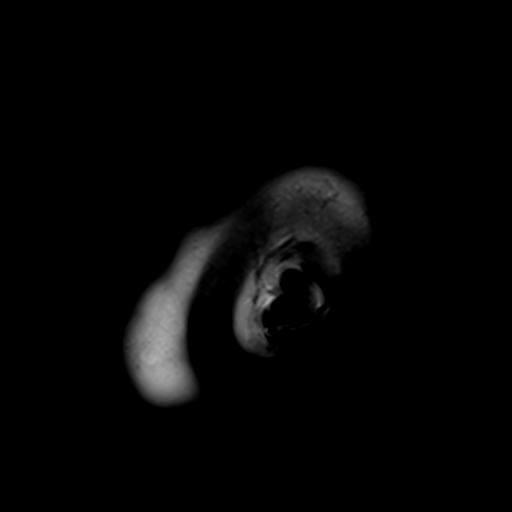

[Series 8: T2 · axial · 5.0mm · 0.45mm/px · z∈[-68,+66]mm · 2 of 22 slices shown (2 of 3)]
[im 1/22]
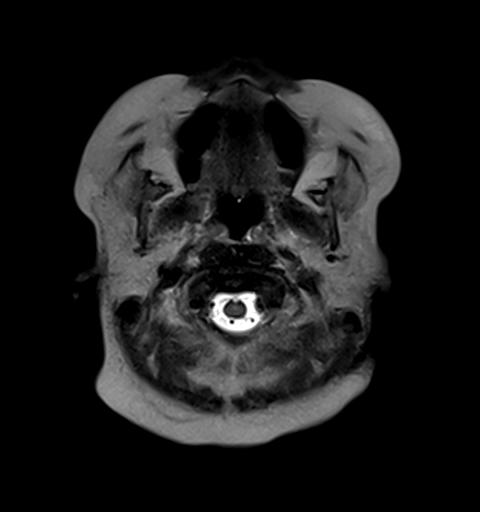
[im 22/22]
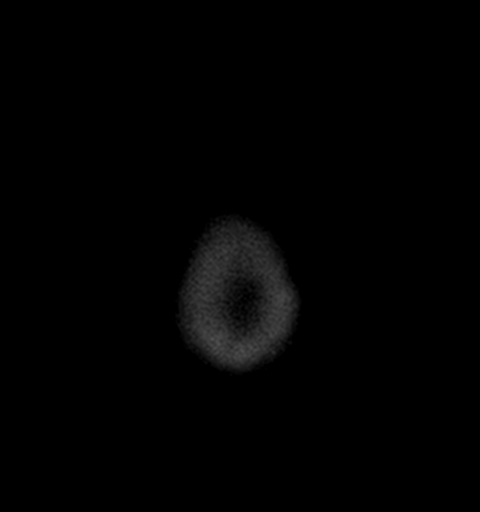

[Series 9: GRE · axial · 3.0mm · 0.45mm/px · z∈[-66,+66]mm · 5 of 46 slices shown]
[im 1/46]
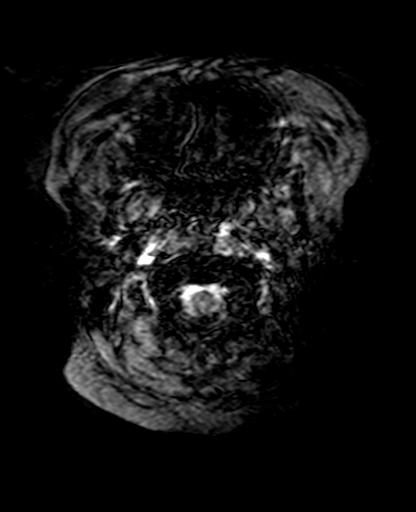
[im 12/46]
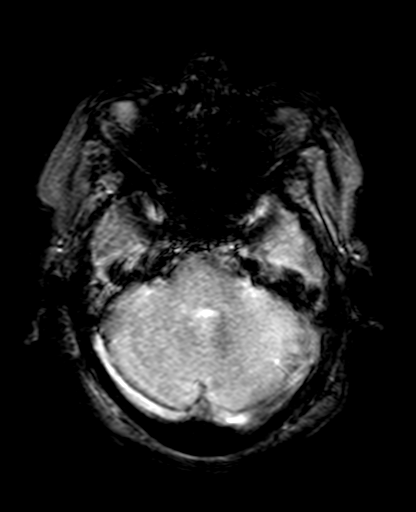
[im 23/46]
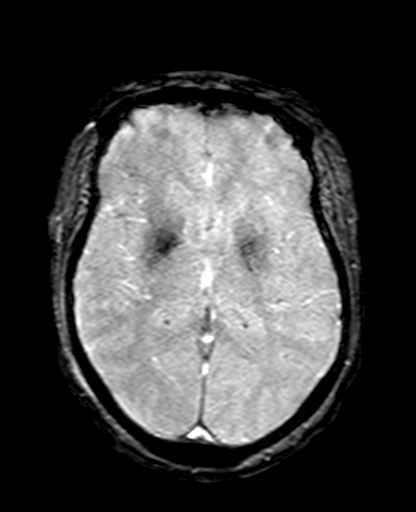
[im 34/46]
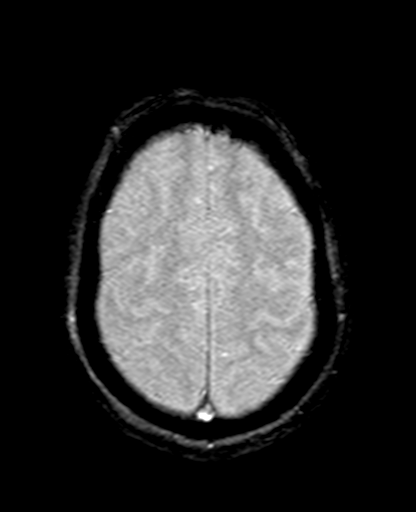
[im 46/46]
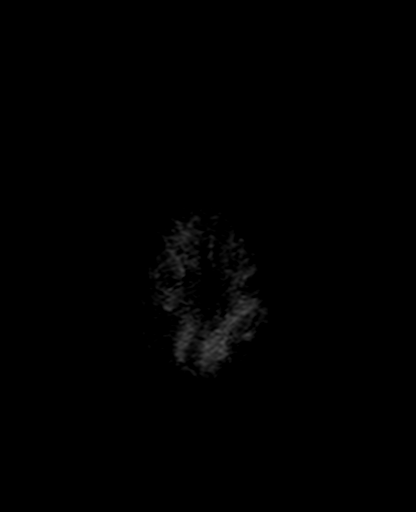

[Series 10: FLAIR · axial · 3.0mm · 0.86mm/px · z∈[-70,+69]mm · 5 of 48 slices shown]
[im 1/48]
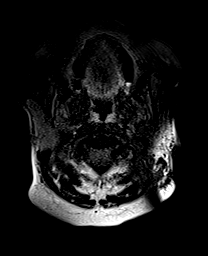
[im 12/48]
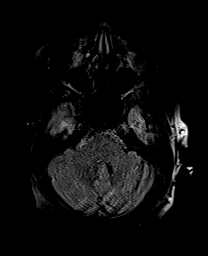
[im 24/48]
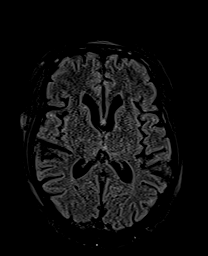
[im 36/48]
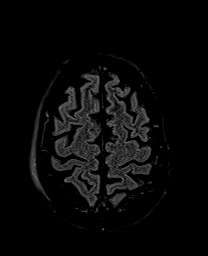
[im 48/48]
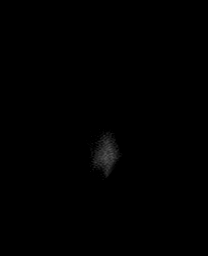

[Series 11: T1 · axial · 3.0mm · 0.45mm/px · z∈[-69,+69]mm · 5 of 48 slices shown]
[im 1/48]
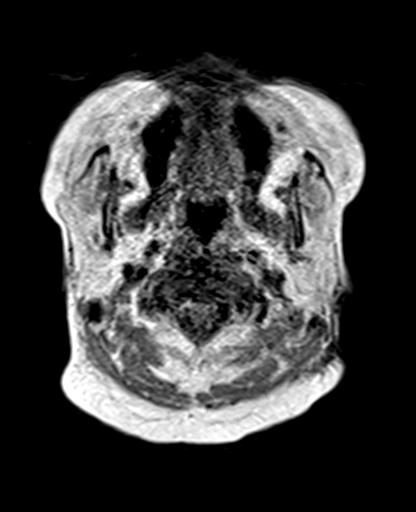
[im 12/48]
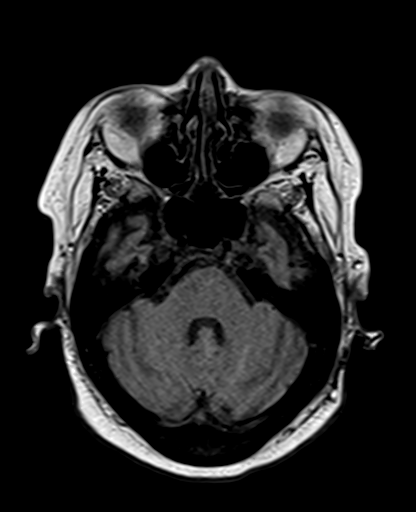
[im 24/48]
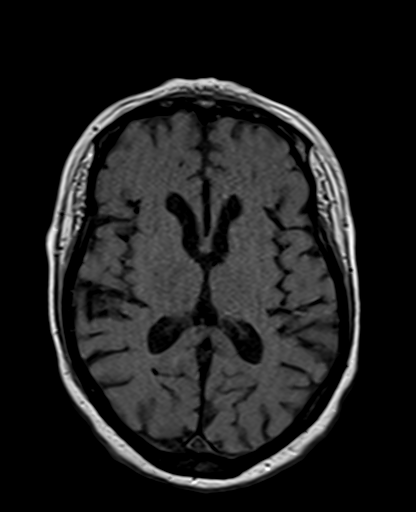
[im 36/48]
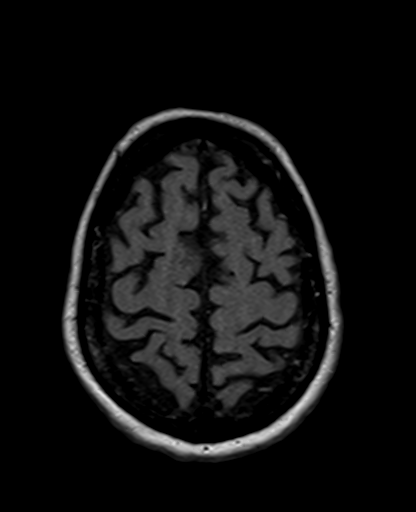
[im 48/48]
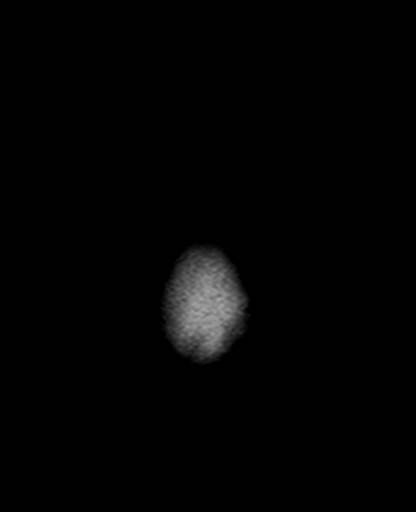

[Series 12: DWI · coronal · 5.0mm · 1.31mm/px · 6 of 52 slices shown (1 of 2)]
[im 1/52]
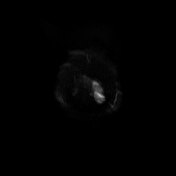
[im 11/52]
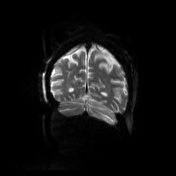
[im 21/52]
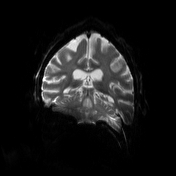
[im 31/52]
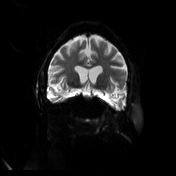
[im 41/52]
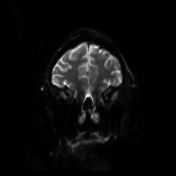
[im 52/52]
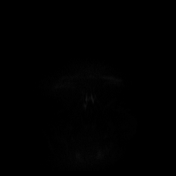

[Series 13: DWI · coronal · 5.0mm · 1.31mm/px · 3 of 26 slices shown (2 of 2)]
[im 1/26]
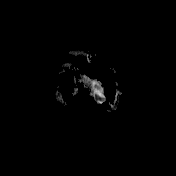
[im 13/26]
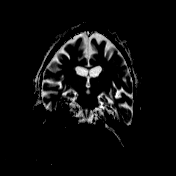
[im 26/26]
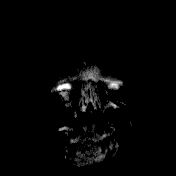

[Series 14: T2 · coronal · 5.0mm · 0.86mm/px · 3 of 26 slices shown (3 of 3)]
[im 1/26]
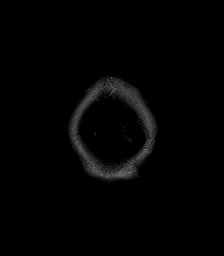
[im 13/26]
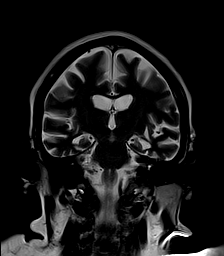
[im 26/26]
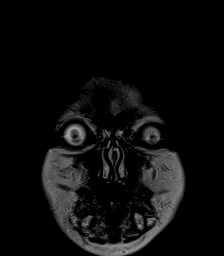

[42 of 48 positions shown; findings below may reference images not displayed]

FINDINGS: Evaluation is somewhat limited by motion artifact.

Brain: No restricted diffusion to suggest acute or subacute
infarcts. No acute hemorrhage, mass, mass effect, or midline shift.
Redemonstrated encephalomalacia in the left-greater-than-right
anterior temporal lobes. Advanced cerebral atrophy for age,
unchanged compared to [DATE]. No hydrocephalus or extra-axial
collection.

Vascular: Normal flow voids, with redemonstrated hypoplastic right
vertebral artery.

Skull and upper cervical spine: Again noted is diffusely decreased
marrow signal, which is nonspecific; although this can be caused by
an infiltrative marrow process, the most common causes include
anemia, obesity, or tobacco use. No focal suspicious osseous lesion.

Sinuses/Orbits: Negative.

Other: The mastoids are well aerated.
IMPRESSION: No acute intracranial process. No etiology is seen for the patient's
dizziness.

## 2021-06-01 IMAGING — CT CT HEAD W/O CM
3 series · 16 of 47 positions shown, 19 images · non-contrast
Comparison: CT head and brain MRI [DATE]

CLINICAL DATA: Head trauma, altered mental status



[Series 2: head wo · axial · 0.41mm/px · z∈[-430,-295]mm · 10 of 53 slices shown, 13 images]
[im 4/53  brain]
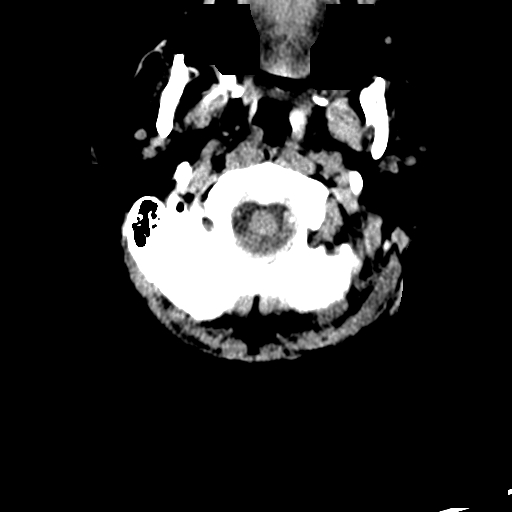
[im 4/53  bone]
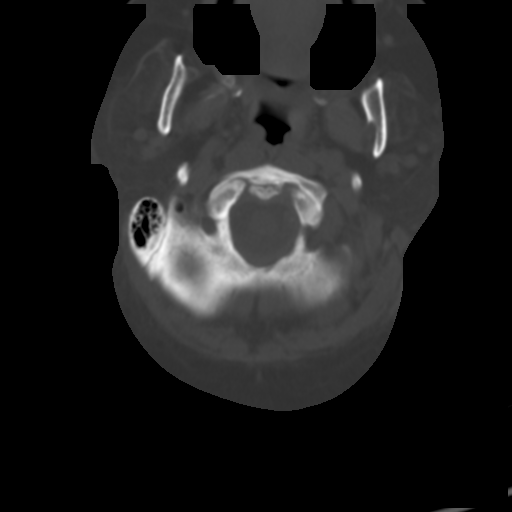
[im 9/53  brain]
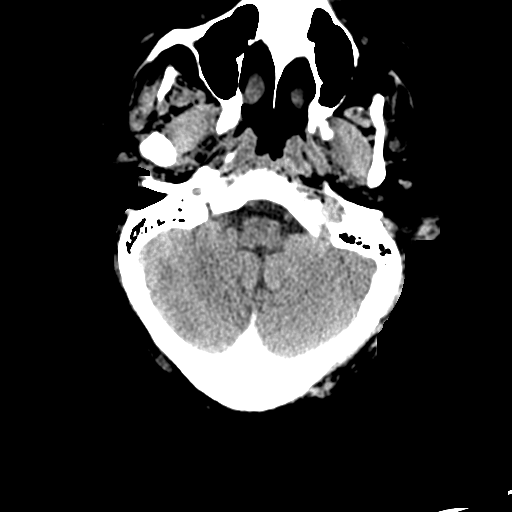
[im 15/53  brain]
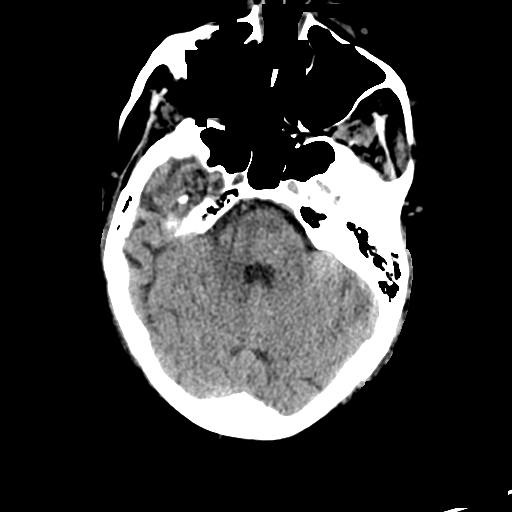
[im 18/53  brain]
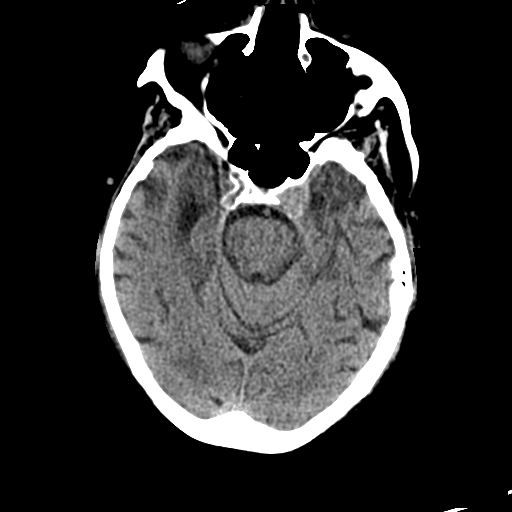
[im 24/53  brain]
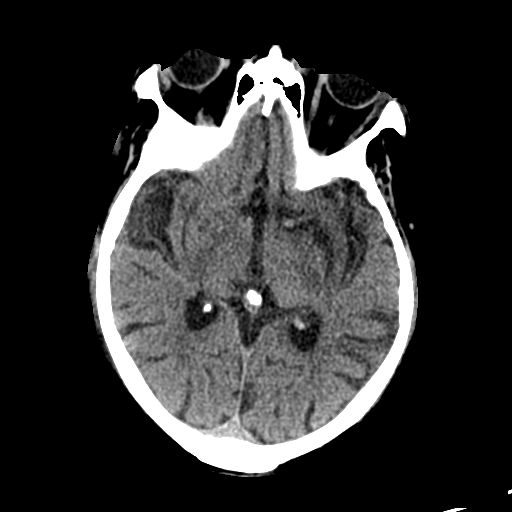
[im 24/53  bone]
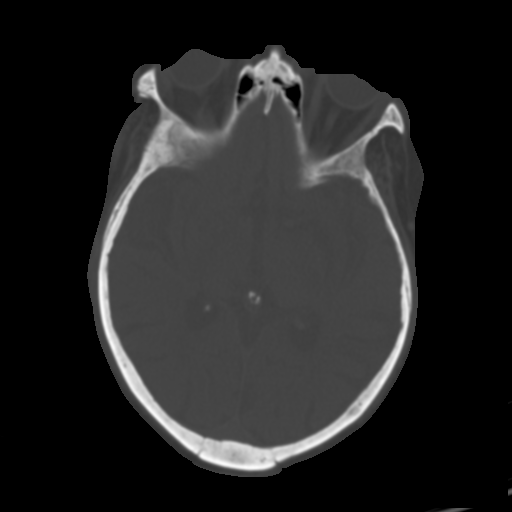
[im 29/53  brain]
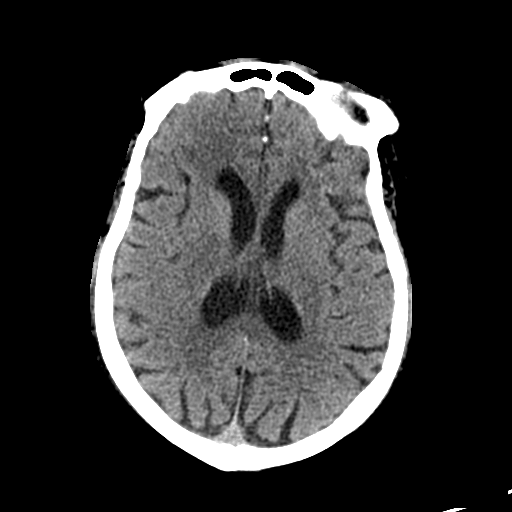
[im 35/53  brain]
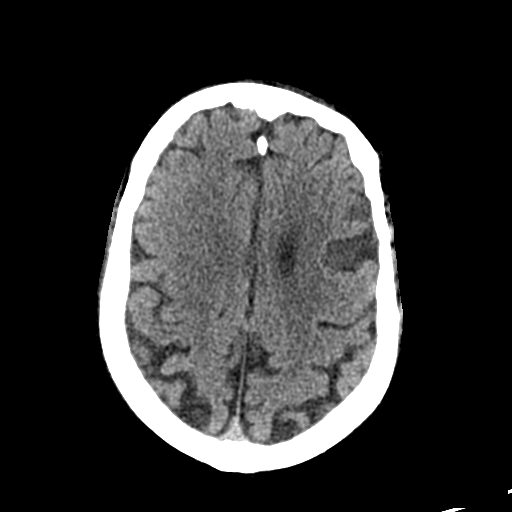
[im 40/53  brain]
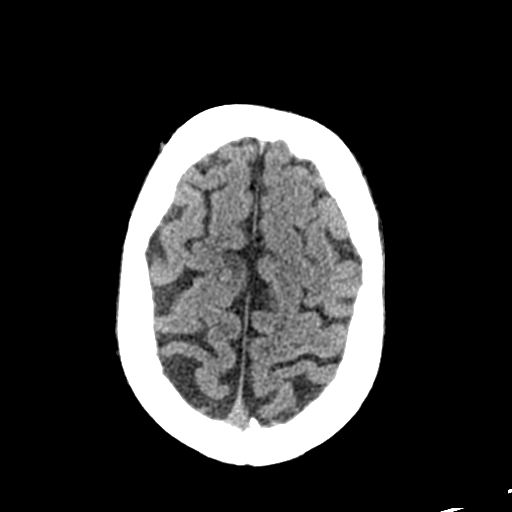
[im 44/53  brain]
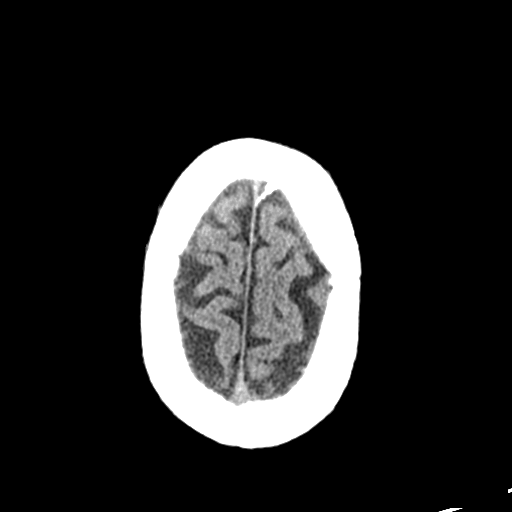
[im 44/53  bone]
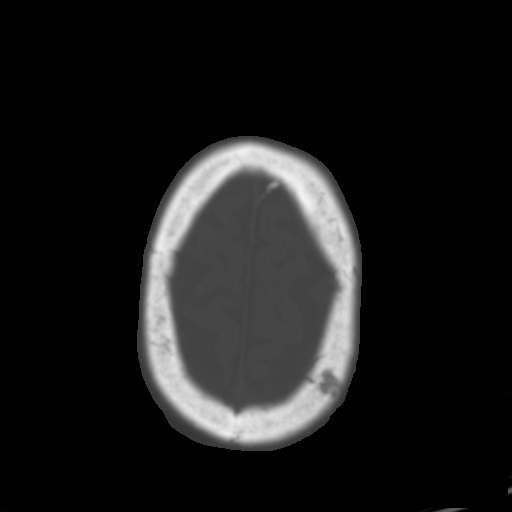
[im 49/53  brain]
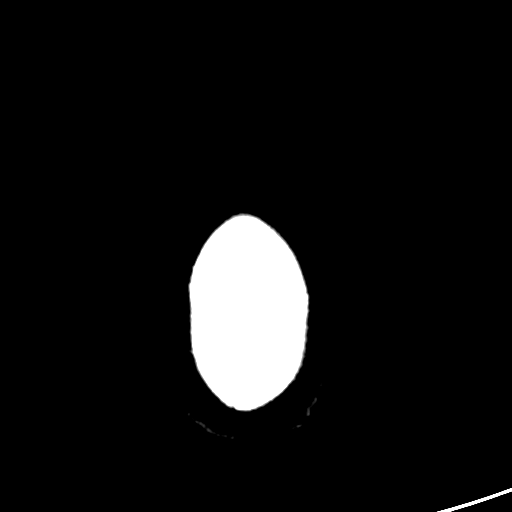

[Series 4: coronal soft tissue · coronal · 0.33mm/px · 3 of 69 slices shown]
[im 27/69  brain]
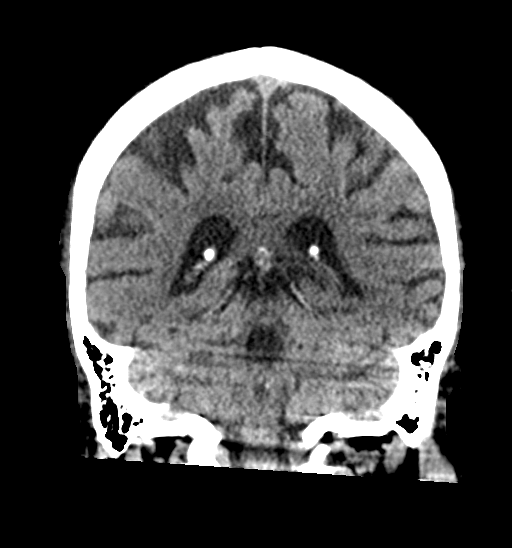
[im 32/69  brain]
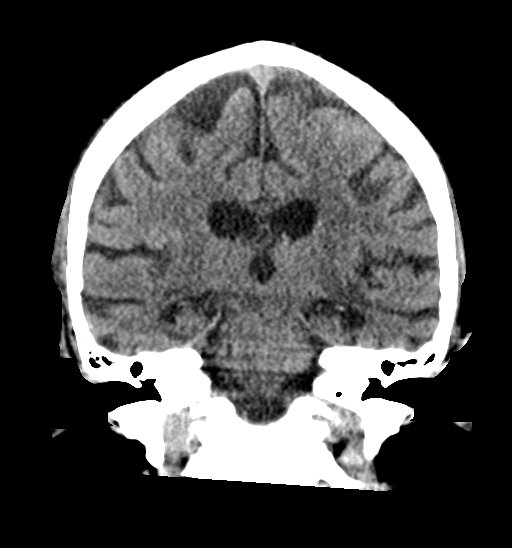
[im 37/69  brain]
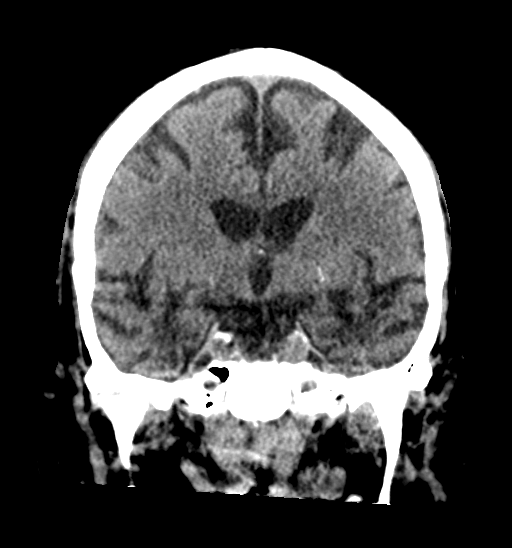

[Series 5: sagittal soft tissue · sagittal · 0.36mm/px · 3 of 57 slices shown]
[im 19/57  brain]
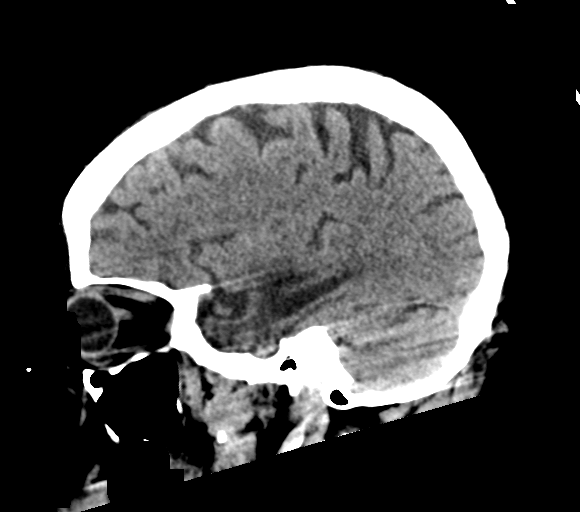
[im 29/57  brain]
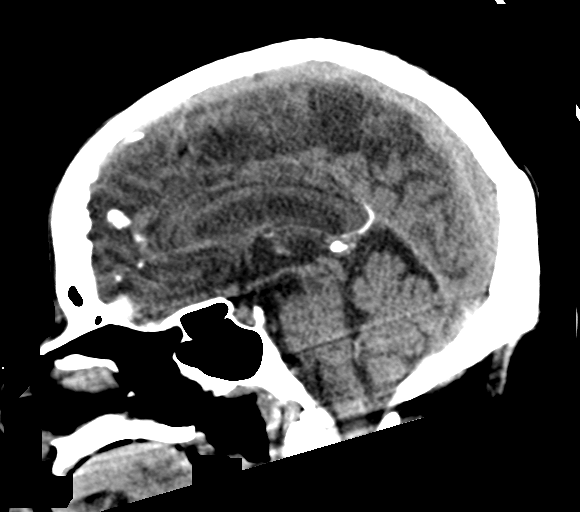
[im 38/57  brain]
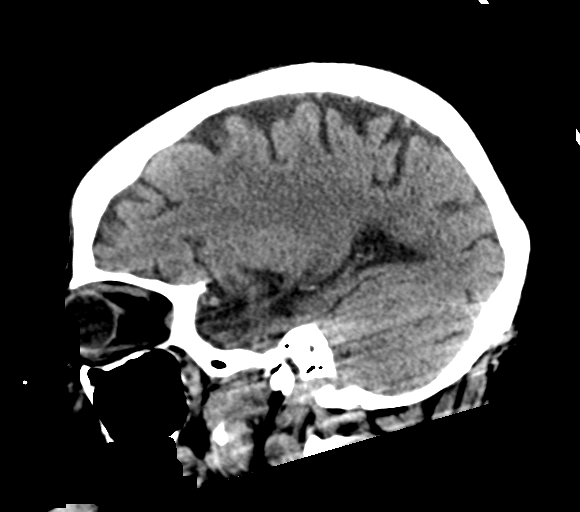

[16 of 47 positions shown; findings below may reference images not displayed]

FINDINGS: Brain: There is no evidence of acute intracranial hemorrhage,
extra-axial fluid collection, or acute infarct.

There is a background of mild global parenchymal volume loss with
prominence of the ventricular system and extra-axial CSF spaces,
slightly advanced for age. Encephalomalacia in the bilateral
anterior temporal lobes with mild associated ex vacuo dilatation of
the temporal horns is unchanged. The ventricles are stable in size.
Gray-white differentiation is preserved.

There is no mass lesion.  There is no midline shift.

Vascular: No hyperdense vessel or unexpected calcification.

Skull: Normal. Negative for fracture or focal lesion.

Sinuses/Orbits: The paranasal sinuses are clear. The globes and
orbits are unremarkable.

Other: The mastoid air cells are clear.
IMPRESSION: 1. No acute intracranial pathology.
2. Unchanged encephalomalacia in the anterior temporal lobes, which
may reflect sequela of prior trauma.

## 2021-06-01 MED ORDER — PANTOPRAZOLE SODIUM 40 MG PO TBEC
40.0000 mg | DELAYED_RELEASE_TABLET | Freq: Every day | ORAL | Status: DC
Start: 2021-06-01 — End: 2021-06-07
  Administered 2021-06-01 – 2021-06-07 (×7): 40 mg via ORAL
  Filled 2021-06-01 (×7): qty 1

## 2021-06-01 MED ORDER — DOCUSATE SODIUM 100 MG PO CAPS
100.0000 mg | ORAL_CAPSULE | Freq: Two times a day (BID) | ORAL | Status: DC
  Administered 2021-06-01 – 2021-06-07 (×10): 100 mg via ORAL
  Filled 2021-06-01 (×11): qty 1

## 2021-06-01 MED ORDER — AMLODIPINE BESYLATE 5 MG PO TABS
5.0000 mg | ORAL_TABLET | Freq: Every day | ORAL | Status: DC
  Administered 2021-06-01 – 2021-06-03 (×3): 5 mg via ORAL
  Filled 2021-06-01 (×3): qty 1

## 2021-06-01 MED ORDER — ONDANSETRON HCL 4 MG/2ML IJ SOLN: 4.0000 mg | Freq: Four times a day (QID) | INTRAMUSCULAR | Status: DC | PRN

## 2021-06-01 MED ORDER — ACETAMINOPHEN 650 MG RE SUPP: 650.0000 mg | Freq: Four times a day (QID) | RECTAL | Status: DC | PRN

## 2021-06-01 MED ORDER — ALLOPURINOL 100 MG PO TABS
100.0000 mg | ORAL_TABLET | Freq: Two times a day (BID) | ORAL | Status: DC
Start: 2021-06-01 — End: 2021-06-07
  Administered 2021-06-01 – 2021-06-07 (×12): 100 mg via ORAL
  Filled 2021-06-01 (×12): qty 1

## 2021-06-01 MED ORDER — ENOXAPARIN SODIUM 80 MG/0.8ML IJ SOSY
80.0000 mg | PREFILLED_SYRINGE | INTRAMUSCULAR | Status: DC
  Administered 2021-06-01 – 2021-06-06 (×6): 80 mg via SUBCUTANEOUS
  Filled 2021-06-01 (×6): qty 0.8

## 2021-06-01 MED ORDER — MOMETASONE FURO-FORMOTEROL FUM 200-5 MCG/ACT IN AERO
2.0000 | INHALATION_SPRAY | Freq: Two times a day (BID) | RESPIRATORY_TRACT | Status: DC
  Administered 2021-06-01 – 2021-06-07 (×12): 2 via RESPIRATORY_TRACT
  Filled 2021-06-01: qty 8.8

## 2021-06-01 MED ORDER — LEVOTHYROXINE SODIUM 50 MCG PO TABS
50.0000 ug | ORAL_TABLET | Freq: Every day | ORAL | Status: DC
  Administered 2021-06-02 – 2021-06-07 (×6): 50 ug via ORAL
  Filled 2021-06-01 (×6): qty 1

## 2021-06-01 MED ORDER — ACETAMINOPHEN 325 MG PO TABS
650.0000 mg | ORAL_TABLET | Freq: Four times a day (QID) | ORAL | Status: DC | PRN
  Administered 2021-06-02 – 2021-06-05 (×4): 650 mg via ORAL
  Filled 2021-06-01 (×4): qty 2

## 2021-06-01 MED ORDER — FUROSEMIDE 40 MG PO TABS
20.0000 mg | ORAL_TABLET | Freq: Every day | ORAL | Status: DC
  Administered 2021-06-02 – 2021-06-03 (×2): 20 mg via ORAL
  Filled 2021-06-01 (×2): qty 1

## 2021-06-01 MED ORDER — ROSUVASTATIN CALCIUM 20 MG PO TABS
10.0000 mg | ORAL_TABLET | Freq: Every day | ORAL | Status: DC
  Administered 2021-06-01 – 2021-06-06 (×6): 10 mg via ORAL
  Filled 2021-06-01 (×6): qty 1

## 2021-06-01 MED ORDER — DEXTROSE IN LACTATED RINGERS 5 % IV SOLN: INTRAVENOUS | Status: DC

## 2021-06-01 MED ORDER — ALBUTEROL SULFATE (2.5 MG/3ML) 0.083% IN NEBU: 2.5000 mg | INHALATION_SOLUTION | Freq: Four times a day (QID) | RESPIRATORY_TRACT | Status: DC | PRN

## 2021-06-01 MED ORDER — ONDANSETRON HCL 4 MG PO TABS
4.0000 mg | ORAL_TABLET | Freq: Four times a day (QID) | ORAL | Status: DC | PRN
Start: 2021-06-01 — End: 2021-06-07

## 2021-06-01 NOTE — ED Provider Notes (Signed)
Patient discussed and care taken over from previous provider Soto PA-C at shift change. See her note for full HPI.   Physical Exam  BP (!) 106/57    Pulse 95    Temp 98 F (36.7 C) (Oral)    Resp 18    LMP 12/08/2012    SpO2 97%   Physical Exam Vitals and nursing note reviewed.  Constitutional:      Appearance: Normal appearance.  HENT:     Head: Normocephalic and atraumatic.  Eyes:     Conjunctiva/sclera: Conjunctivae normal.  Pulmonary:     Effort: Pulmonary effort is normal. No respiratory distress.  Skin:    General: Skin is warm and dry.  Neurological:     Mental Status: She is alert.  Psychiatric:        Mood and Affect: Mood normal.        Behavior: Behavior normal.    Procedures  Procedures  ED Course / MDM   Clinical Course as of 06/01/21 1712  Thu Jun 01, 2021  1449 Blood sugar 36 [LR]  1711 Glucose-Capillary(!): 60 [LR]    Clinical Course User Index [LR] Jye Fariss, Cecille Aver, PA-C   Medical Decision Making Amount and/or Complexity of Data Reviewed Labs: ordered. Radiology: ordered.  Risk Decision regarding hospitalization.   Patient is a 56 year old female with history of diabetes, CKD stage III, HLD, HTN who presents to the emergency department after fall and hypoglycemia.  Patient reports about 5 falls in the past week, all related to low blood sugar.  Patient lives with her  Patient taken over from previous provider, the plan at time of shift change was to to work with social work and get patient SNF placement.  The only thing pending at time of shift change is a PT evaluation.  Labs were collected, and resulted with patient having a blood sugar of 36.  Nursing is given patient multiple cups of juice, sandwich, and crackers, and blood sugar has raised to 60.  Even if patient were to be evaluated by PT, I believe that she is requiring admission and inpatient management of her hypoglycemia before she is able to stepdown to an SNF facility. Patient  continues to appear well and is mentating well, but has persistent hypoglycemia. Patient denies self harm or suicidal ideation, and states that she does not overuse insulin intentionally.   5:00pm Consulted hospitalist Dr. Posey Pronto who accepted patient admission. The patient appears reasonably stabilized for admission considering the current resources, flow, and capabilities available in the ED at this time, and I doubt any other [facility] requiring further screening and/or treatment in the ED prior to admission.   Kateri Plummer, PA-C 06/01/21 1712    Lorelle Gibbs, DO 06/01/21 1727

## 2021-06-01 NOTE — Assessment & Plan Note (Addendum)
Due to excessive insulin use with poor p.o. 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 Patient presents with complaints of recurrent falls. CBG was significantly low. Despite providing oral glucose CBG remains low and therefore patient was referred for admission. Hemoglobin A1c 6.8. Blood sugar has improved.  Now D10 have been discontinued. Continue sliding scale insulin.  Will initiate low-dose Levemir.

## 2021-06-01 NOTE — ED Notes (Signed)
Pt given orange juice.

## 2021-06-01 NOTE — H&P (Signed)
History and Physical    Patient: Makayla [name] VZD:[id number] DOB: [birth year] DOA: 06/01/2021 DOS: the patient was seen and examined on 06/01/2021 PCP: Arthur Holms, NP  Patient coming from: Home  Chief Complaint:  Chief Complaint  Patient presents with   Fall    HPI: Makayla [name] is a 56 y.o. female with medical history significant of type 2 diabetes mellitus, CKD 3B, chronic neuropathy, HTN, HLD, morbid obesity.  Patient presented with complaints of confusion and low sugar as well as recurrent falls. Hospitalized between 12/13 - 03/24/2021 for abdominal wall cellulitis and ovarian mass 1/4 - 04/19/2018 for hyponatremia 2/8 - 2/16 sepsis secondary to B fragilis and COVID-19 infection and hyponatremia.. Reportedly after her recent discharge patient had 5 falls although patient unable to tell me the chronology of the events. She tells me that yesterday when she tries to stand up to go to the bathroom she fell asleep and ended up falling. The day before when she was trying to go to the bathroom she felt dizzy and had a fall. She denies any head injury or neck injury. She denies any loss of control of bowel or bladder. She tells me that she is using 56 units of insulin once a day and compliant with all other medications. She denies any suicidal abnormalities or ideation. No nausea no vomiting no diarrhea no poor p.o. intake reported. No fever no chills no chest pain abdominal pain reported by the patient. No smoking no alcohol abuse reported by the patient as well.  In ED work-up was unremarkable.  Patient was found hypoglycemic.  PT OT was consulted and initial plan was to arrange for SNF although patient's blood sugar were not normalizing requiring frequent intervention and therefore patient was referred for admission.  Review of Systems: As mentioned in the history of present illness. All other systems reviewed and are negative. Past Medical History:  Diagnosis Date   Abnormal uterine  bleeding    Cellulitis    Chronic bronchitis (HCC)    CKD (chronic kidney disease), stage III ([place])    Diabetes mellitus    Diabetes mellitus without complication ([place]) 2/95/1884   Qualifier: Diagnosis of  By: Doy Mince LPN, Megan     Hyperlipidemia    Hypertension    Hypokalemia 12/08/2012   Neuropathy    Past Surgical History:  Procedure Laterality Date   TONSILLECTOMY     Social History:  reports that she has quit smoking. Her smoking use included cigarettes. She has a 2.30 pack-year smoking history. She has never used smokeless tobacco. She reports that she does not currently use alcohol. She reports that she does not use drugs.  Allergies  Allergen Reactions   Penicillins Shortness Of Breath    Inflates bronchitis Tolerates Keflex, Rocephin    Ibuprofen Other (See Comments)    Can not take due to kidney problems.    Peach Flavor Swelling    Peaches.    Strawberry Extract Swelling    Family History  Problem Relation Age of Onset   Diabetes Mother    Hypertension Mother    Hyperlipidemia Mother    Diabetes Father    Heart disease Father    Diabetes Sister     Prior to Admission medications   Medication Sig Start Date End Date Taking? Authorizing Provider  acetaminophen (TYLENOL) 325 MG tablet Take 650 mg by mouth every 6 (six) hours as needed for moderate pain.    [provider]  albuterol (PROVENTIL HFA;VENTOLIN HFA) 108 (90 BASE)  MCG/ACT inhaler Inhale 2 puffs into the lungs every 6 (six) hours as needed for wheezing or shortness of breath. For shortness of breath    [provider]  allopurinol (ZYLOPRIM) 100 MG tablet Take 100 mg by mouth 2 (two) times daily. 04/11/21   [provider]  amLODipine (NORVASC) 5 MG tablet Take 1 tablet by mouth daily. 03/24/21 06/22/21  Kayleen Memos, DO  B-D UF III MINI PEN NEEDLES 31G X 5 MM MISC See admin instructions. 04/28/15   [provider]  cyclobenzaprine (FLEXERIL) 5 MG tablet Take 1 tablet  twice a day as needed for Muscle spasms. 01/31/15   Elayne Snare, MD  furosemide (LASIX) 20 MG tablet Take 20 mg by mouth daily. 04/11/21   [provider]  glipiZIDE (GLUCOTROL) 5 MG tablet Take 5 mg by mouth 2 (two) times daily before a meal.    [provider]  glucose blood (ONETOUCH VERIO) test strip Use as instructed to check blood sugar 2 times daily Dx code E11.9 11/03/14   Elayne Snare, MD  Insulin Syringe-Needle U-100 (SAFETY-GLIDE 0.3CC SYR 29GX1/2) 29G X 1/2" 0.3 ML MISC Use as directed. 12/11/12   Hongalgi, Lenis Dickinson, MD  levothyroxine (SYNTHROID) 50 MCG tablet Take 50 mcg by mouth daily before breakfast. 04/11/21   [provider]  MOUNJARO 2.5 MG/0.5ML Pen Inject 0.5 mLs into the skin once a week. Monday 05/11/21   [provider]  Multiple Vitamin (MULTIVITAMIN WITH MINERALS) TABS tablet Take 1 tablet by mouth daily. Patient not taking: Reported on 05/18/2021 04/20/21   Swayze, Ava, DO  omeprazole (PRILOSEC) 20 MG capsule Take 20 mg by mouth daily before breakfast.    [provider]  psyllium (METAMUCIL) 58.6 % packet Take 1 packet by mouth daily as needed (diarrhea).    [provider]  rosuvastatin (CRESTOR) 10 MG tablet Take 10 mg by mouth at bedtime. 04/11/21   [provider]  SYMBICORT 160-4.5 MCG/ACT inhaler Inhale 2 puffs into the lungs 2 (two) times daily. 10/02/17   [provider]  TRESIBA FLEXTOUCH 200 UNIT/ML FlexTouch Pen Inject 56 Units into the skin in the morning and at bedtime. 04/07/21   [provider]    Physical Exam: Vitals:   06/01/21 1615 06/01/21 1630 06/01/21 1702 06/01/21 1730  BP:  (!) 106/57 (!) 106/57 (!) 129/47  Pulse: 95  95 96  Resp: 20 19 18 19   Temp:      TempSrc:      SpO2: 100%  97% 98%   General: Appear in mild distress, no Rash; Oral Mucosa Clear, moist. no Abnormal Neck Mass Or lumps, Conjunctiva normal  Cardiovascular: S1 and S2 Present, no Murmur, Respiratory: good  respiratory effort, Bilateral Air entry present and CTA, no Crackles, no wheezes Abdomen: Bowel Sound present, Soft and no tenderness Extremities: no Pedal edema Neurology: alert and oriented to time, place, and person affect appropriate. no new focal deficit, Horizontal nystagmus Bilaterally, Gait not checked due to patient safety concerns.  Bilateral tremors and resultant abnormal past-pointing bilaterally.  Data Reviewed: I have Reviewed nursing notes, Vitals, and Lab results since pt's last encounter. Pertinent lab results CBC, CMP I have ordered test including TSH, free T4 I have ordered imaging studies MRI brain. I have independently visualized and interpreted EKG which showed EKG: normal sinus rhythm, nonspecific ST and T waves changes. I have reviewed the last note from EDP, PT OT,  I have discussed pt's care plan and test results  with EDP.   Assessment and Plan: * Hypoglycemia- (present on admission) Due to excessive insulin use with poor p.o. 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 Patient presents with complaints of recurrent falls. CBG was significantly low. Despite providing oral glucose CBG remains low and therefore patient was referred for admission. We will check hemoglobin A1c. Hold all insulin as well as oral hypoglycemic agents. CBG every 4 hours. Initiate D5 LR. Monitor on telemetry.  Unwitnessed fall- (present on admission) Ataxia Horizontal nystagmus Patient reports 5 falls.  Most likely the falls are secondary to hypoglycemic episodes. Last night's fall was reported as she went to sleep when she stood up! Currently does not have any focal deficit but have bilateral tremors as well as abnormal past-pointing and horizontal nystagmus. CT head performed but still pending. We will perform MRI brain to rule out acute stroke.  Ataxia, horizontal nystagmus PT OT consulted.  Recommend  SNF.  Hypothyroidism- (present on admission) Continue Synthroid.  TSH normal.  Stage 3b chronic kidney disease (CKD) ([place])- (present on admission) Baseline serum creatinine 1.3.  Currently serum creatinine 1.0. GFR more than 30. Monitor avoid nephrotoxic medication.  Obesity, morbid ([place])- (present on admission) BMI more than 40. Placing the patient at high risk of poor outcome. On [place] outpatient.  Currently on hold.  Hyperlipidemia associated with type 2 diabetes mellitus ([place])- (present on admission) Continue statin.       Advance Care Planning:   Code Status: Full Code   Consults: none  Family Communication: none  Severity of Illness: The appropriate patient status for this patient is OBSERVATION. Observation status is judged to be reasonable and necessary in order to provide the required intensity of service to ensure the patient's safety. The patient's presenting symptoms, physical exam findings, and initial radiographic and laboratory data in the context of their medical condition is felt to place them at decreased risk for further clinical deterioration. Furthermore, it is anticipated that the patient will be medically stable for discharge from the hospital within 2 midnights of admission.   Author: Berle Mull, MD 06/01/2021 7:13 PM  For on call review [email or web address].

## 2021-06-01 NOTE — NC FL2 (Signed)
[place] LEVEL OF CARE SCREENING TOOL     IDENTIFICATION  Patient Name: Makayla [name] Birthdate: 09-Nov-1965 Sex: female Admission Date (Current Location): 06/01/2021  [facility] [place] Number:  Herbalist and Address:         Provider Number: [id number]  Attending Physician Name and Address:  Lavina Hamman, MD  Relative Name and Phone Number:       Current Level of Care: Hospital Recommended Level of Care: [place] Prior Approval Number:    Date Approved/Denied:   PASRR Number: [id number]  Discharge Plan: SNF    Current Diagnoses: Patient Active Problem List   Diagnosis Date Noted   Hypoglycemia 06/01/2021   Pressure injury of back, stage 2 ([place]) 53/29/9242   Biliary colic    Bacteremia    Acute renal failure superimposed on stage 3b chronic kidney disease ([place]) 05/17/2021   Pressure injury of skin 04/19/2021   Erythema intertrigo 04/14/2021   Hypothyroidism 04/14/2021   Abnormal urinalysis 04/13/2021   Hyponatremia 04/13/2021   Bilateral leg weakness 04/13/2021   Abnormal brain MRI 04/13/2021   Cellulitis of abdominal wall 03/21/2021   Type 2 diabetes mellitus with chronic kidney disease, with long-term current use of insulin ([place]) 03/21/2021   Stage 3b chronic kidney disease (CKD) ([place]) 03/21/2021   Elevated CK 03/21/2021   Ovarian mass, left 03/21/2021   Dehydration 12/08/2012   Obesity, morbid ([place]) 12/08/2012   Tobacco abuse 12/08/2012   SNORING 06/16/2009   Hyperlipidemia associated with type 2 diabetes mellitus ([place]) 05/09/2009   Hypertension associated with diabetes ([place]) 05/09/2009    Orientation RESPIRATION BLADDER Height & Weight     Self, Time, Situation, Place  Normal Continent Weight:   Height:     BEHAVIORAL SYMPTOMS/MOOD NEUROLOGICAL BOWEL NUTRITION STATUS      Continent Diet  AMBULATORY STATUS COMMUNICATION OF NEEDS Skin   Extensive Assist Verbally Normal                        Personal Care Assistance Level of Assistance  Bathing, Dressing, Feeding Bathing Assistance: Maximum assistance Feeding assistance: Independent Dressing Assistance: Limited assistance     Functional Limitations Info  Sight, Hearing, Speech Sight Info: Adequate Hearing Info: Adequate Speech Info: Adequate    SPECIAL CARE FACTORS FREQUENCY                       Contractures Contractures Info: Not present    Additional Factors Info  Code Status, Allergies Code Status Info: Full Allergies Info: Penicillins: shortness of breath, Ibuprofen: swelling, peach flavor: swelling, strawberry extract: swelling           Current Medications (06/01/2021):  This is the current hospital active medication list No current facility-administered medications for this encounter.   Current Outpatient Medications  Medication Sig Dispense Refill   acetaminophen (TYLENOL) 325 MG tablet Take 650 mg by mouth every 6 (six) hours as needed for moderate pain.     albuterol (PROVENTIL HFA;VENTOLIN HFA) 108 (90 BASE) MCG/ACT inhaler Inhale 2 puffs into the lungs every 6 (six) hours as needed for wheezing or shortness of breath. For shortness of breath     allopurinol (ZYLOPRIM) 100 MG tablet Take 100 mg by mouth 2 (two) times daily.     amLODipine (NORVASC) 5 MG tablet Take 1 tablet by mouth daily. 90 tablet 0   B-D UF III MINI PEN NEEDLES 31G X 5 MM MISC See  admin instructions.  0   cyclobenzaprine (FLEXERIL) 5 MG tablet Take 1 tablet twice a day as needed for Muscle spasms. 10 tablet 0   furosemide (LASIX) 20 MG tablet Take 20 mg by mouth daily.     glipiZIDE (GLUCOTROL) 5 MG tablet Take 5 mg by mouth 2 (two) times daily before a meal.     glucose blood (ONETOUCH VERIO) test strip Use as instructed to check blood sugar 2 times daily Dx code E11.9 100 each 3   Insulin Syringe-Needle U-100 (SAFETY-GLIDE 0.3CC SYR 29GX1/2) 29G X 1/2" 0.3 ML MISC Use as directed. 200 each 0   levothyroxine  (SYNTHROID) 50 MCG tablet Take 50 mcg by mouth daily before breakfast.     MOUNJARO 2.5 MG/0.5ML Pen Inject 0.5 mLs into the skin once a week. Monday     Multiple Vitamin (MULTIVITAMIN WITH MINERALS) TABS tablet Take 1 tablet by mouth daily. (Patient not taking: Reported on 05/18/2021) 30 tablet 0   omeprazole (PRILOSEC) 20 MG capsule Take 20 mg by mouth daily before breakfast.     psyllium (METAMUCIL) 58.6 % packet Take 1 packet by mouth daily as needed (diarrhea).     rosuvastatin (CRESTOR) 10 MG tablet Take 10 mg by mouth at bedtime.     SYMBICORT 160-4.5 MCG/ACT inhaler Inhale 2 puffs into the lungs 2 (two) times daily.  3   TRESIBA FLEXTOUCH 200 UNIT/ML FlexTouch Pen Inject 56 Units into the skin in the morning and at bedtime.       Discharge Medications: Please see discharge summary for a list of discharge medications.  Relevant Imaging Results:  Relevant Lab Results:   Additional Information    Roseanne Kaufman, RN

## 2021-06-01 NOTE — Assessment & Plan Note (Signed)
Baseline serum creatinine 1.3.  Currently serum creatinine 1.0. GFR more than 30. Monitor avoid nephrotoxic medication.

## 2021-06-01 NOTE — ED Notes (Signed)
Pt helped to bedside commode with assistance of staff.

## 2021-06-01 NOTE — Progress Notes (Addendum)
..Transition of Care [facility]) - Emergency Department Mini Assessment   Patient Details  Name: Makayla [name] MRN: [id number] Date of Birth: 09-Aug-1965  [facility] of [facility]) CM/SW Contact: [phone]   Roseanne Kaufman, RN,  Phone Number: 06/01/2021, 3:29 PM   Clinical Narrative:  Patient presented to the [facility] ED from home for multiple falls and hypoglycemic. TOC received consult for [place] vs SNF placement. Bedside RN, Legrand Como reports patient needs max to total assist with sitting up and getting to commode seat in room.    ED Mini Assessment:   3:00pm- RNCM received TOC consult for HHC vs. SNF. RNCM spoke with patient at bedside who request RNCM to call her sister [name] for additional information. Patient reports living at home with her 30 year old mother. Patient's sister [name] lives lives near her. The home is a 2 level however patient has a room and bathroom on the main level. Patient reports having a wheelchair, walker and 3N1 at home. Patient has no difficulty getting her medications in the community. Patient reports she has gout and she continues to have muscle spasms BLE. Patient reports recently she has not been eating a lot, due to not having an appetite. Patient reports she prefers to go home however will do what's best for her health.    3:15pm  RNCM spoke with patient's sister [name] [phone] cell, [phone] home. [name] reports patient has fallen 8 times in the last week. [name] is questioning long term care placement. RNCM advised [facility] ED can not place patient long term that has to be done on the outpatient bases. [name] reports she has spoke with [facility] who is to help with long term placement. This RNCM advised based on the PT evaluation TOC dept can assist with SNF which is hsort term, we are not able to assist with long term. [name] advised patient has [facility] for home hospice. Tim with [facility] has attempted to get the patient placed long term and unable to do so,  however will have patient re-evaluated. RNCM encouraged [name] to contact patient's PCP to assist with an FL2 for long term placement, [name] agreed to follow up with PCP for long term FL2 assistance, [facility] and Adult [facility]. RNCM advised long term Medicaid application process can take up to 30-45 days.  RNCM reviewed patient chart via [place], which reflects patient had previous [place] on 04/19/21-05/05/21 for 16 days. Patient's sister [name] advised patient was showing progress resulting in discharge. RNCM advised [name] that patient has used 16 days of SNF benefits. Insurance normally covers the first 20 days at 100% and the remaining days the patient will be responsible for copay. [name] reports that patient/family will be able to afford the copay days. RNCM advised as long as they are aware of the copay responsibilities, and based on PT's evaluation TOC dept will send patient's information for SNF placement. [name] states the family can no longer take care of the patient.     Patient Contact and Communications  Murriel Hopper (sister) [phone] cell, [phone] home    Admission diagnosis:  hypoglycemia Patient Active Problem List   Diagnosis Date Noted   Pressure injury of back, stage 2 ([place]) 97/67/3419   Biliary colic    Bacteremia    Acute renal failure superimposed on stage 3b chronic kidney disease ([place]) 05/17/2021   Pressure injury of skin 04/19/2021   Erythema intertrigo 04/14/2021   Hypothyroidism 04/14/2021   Abnormal urinalysis 04/13/2021   Hyponatremia 04/13/2021  Bilateral leg weakness 04/13/2021   Abnormal brain MRI 04/13/2021   Cellulitis of abdominal wall 03/21/2021   Type 2 diabetes mellitus with chronic kidney disease, with long-term current use of insulin ([place]) 03/21/2021   Stage 3b chronic kidney disease (CKD) ([place]) 03/21/2021   Elevated CK 03/21/2021   Ovarian mass, left 03/21/2021   Dehydration 12/08/2012   Obesity, morbid ([place]) 12/08/2012    Tobacco abuse 12/08/2012   SNORING 06/16/2009   Hyperlipidemia associated with type 2 diabetes mellitus ([place]) 05/09/2009   Hypertension associated with diabetes ([place]) 05/09/2009   PCP:  Arthur Holms, NP Pharmacy:   [facility], [place] [address] Phone: [phone] Fax: [place], [place] - [address] [place] [address] Phone: [phone] Fax: [phone]  [place] [place] [address] Phone: [phone] Fax: [phone]

## 2021-06-01 NOTE — ED Triage Notes (Signed)
Pt BIB [facility] with reports of fall and hypoglycemia. Pt reports BS of 65 at home. Pt was given cracker and juice. BS with Ems 85. Pt also reports she lost her balance last night when getting up from bed. Denies pain at this time.

## 2021-06-01 NOTE — ED Provider Notes (Addendum)
[facility] DEPT Provider Note   CSN: [id number] Arrival date & time: 06/01/21  0957     History  Chief Complaint  Patient presents with   Makayla [name]    Makayla [name] is a 56 y.o. female.  56 y.o female with a PMH of DM, CHF, presents to the ED with a chief complaint of fall.  According to patient, she has been ambulating at home since her discharge from the hospital has been "off balance ".  She felt like she did not have balance today, Feeling muscle spasms to bilateral legs.  Does feel weaker on the right side after her "I had severe gout ".  She does report taking her blood sugar medication at home, however reports her blood sugar has been running low.  EMS did check patient's CBG on arrival which was in the 60s, given some p.o. intake.  She was recently hospitalized with COVID-19 infection.   The history is provided by the patient and medical records.  Fall This is a new problem.      Home Medications Prior to Admission medications   Medication Sig Start Date End Date Taking? Authorizing Provider  acetaminophen (TYLENOL) 325 MG tablet Take 650 mg by mouth every 6 (six) hours as needed for moderate pain.    [provider]  albuterol (PROVENTIL HFA;VENTOLIN HFA) 108 (90 BASE) MCG/ACT inhaler Inhale 2 puffs into the lungs every 6 (six) hours as needed for wheezing or shortness of breath. For shortness of breath    [provider]  allopurinol (ZYLOPRIM) 100 MG tablet Take 100 mg by mouth 2 (two) times daily. 04/11/21   [provider]  amLODipine (NORVASC) 5 MG tablet Take 1 tablet by mouth daily. 03/24/21 06/22/21  Kayleen Memos, DO  B-D UF III MINI PEN NEEDLES 31G X 5 MM MISC See admin instructions. 04/28/15   [provider]  cyclobenzaprine (FLEXERIL) 5 MG tablet Take 1 tablet twice a day as needed for Muscle spasms. 01/31/15   Elayne Snare, MD  furosemide (LASIX) 20 MG tablet Take 20 mg by mouth daily. 04/11/21   [provider]  glipiZIDE (GLUCOTROL) 5 MG tablet Take 5 mg by mouth 2 (two) times daily before a meal.    [provider]  glucose blood (ONETOUCH VERIO) test strip Use as instructed to check blood sugar 2 times daily Dx code E11.9 11/03/14   Elayne Snare, MD  Insulin Syringe-Needle U-100 (SAFETY-GLIDE 0.3CC SYR 29GX1/2) 29G X 1/2" 0.3 ML MISC Use as directed. 12/11/12   Hongalgi, Lenis Dickinson, MD  levothyroxine (SYNTHROID) 50 MCG tablet Take 50 mcg by mouth daily before breakfast. 04/11/21   [provider]  MOUNJARO 2.5 MG/0.5ML Pen Inject 0.5 mLs into the skin once a week. Monday 05/11/21   [provider]  Multiple Vitamin (MULTIVITAMIN WITH MINERALS) TABS tablet Take 1 tablet by mouth daily. Patient not taking: Reported on 05/18/2021 04/20/21   Swayze, Ava, DO  omeprazole (PRILOSEC) 20 MG capsule Take 20 mg by mouth daily before breakfast.    [provider]  psyllium (METAMUCIL) 58.6 % packet Take 1 packet by mouth daily as needed (diarrhea).    [provider]  rosuvastatin (CRESTOR) 10 MG tablet Take 10 mg by mouth at bedtime. 04/11/21   [provider]  SYMBICORT 160-4.5 MCG/ACT inhaler Inhale 2 puffs into the lungs 2 (two) times daily. 10/02/17   [provider]  TRESIBA FLEXTOUCH 200 UNIT/ML FlexTouch Pen Inject 56 Units  into the skin in the morning and at bedtime. 04/07/21   [provider]      Allergies    Penicillins, Ibuprofen, Peach flavor, and Strawberry extract    Review of Systems   Review of Systems  Physical Exam Updated Vital Signs BP 123/76    Pulse 92    Temp 98 F (36.7 C) (Oral)    Resp 19    LMP 12/08/2012    SpO2 98%  Physical Exam  ED Results / Procedures / Treatments   Labs (all labs ordered are listed, but only abnormal results are displayed) Labs Reviewed  CBC WITH DIFFERENTIAL/PLATELET - Abnormal; Notable for the following components:      Result Value   Hemoglobin 11.1 (*)    HCT 35.1 (*)     RDW 18.1 (*)    Monocytes Absolute 1.1 (*)    All other components within normal limits  COMPREHENSIVE METABOLIC PANEL - Abnormal; Notable for the following components:   Sodium 132 (*)    Glucose, Bld 65 (*)    Creatinine, Ser 1.10 (*)    Calcium 8.5 (*)    Albumin 2.8 (*)    AST 50 (*)    All other components within normal limits  LIPASE, BLOOD - Abnormal; Notable for the following components:   Lipase 55 (*)    All other components within normal limits  BLOOD GAS, VENOUS - Abnormal; Notable for the following components:   pCO2, Ven 42 (*)    All other components within normal limits  BRAIN NATRIURETIC PEPTIDE - Abnormal; Notable for the following components:   B Natriuretic Peptide 125.3 (*)    All other components within normal limits  CBG MONITORING, ED - Abnormal; Notable for the following components:   Glucose-Capillary 66 (*)    All other components within normal limits  CBG MONITORING, ED - Abnormal; Notable for the following components:   Glucose-Capillary 67 (*)    All other components within normal limits  TSH  URINALYSIS, ROUTINE W REFLEX MICROSCOPIC  CBG MONITORING, ED    EKG None  Radiology DG Chest Portable 1 View  Result Date: 06/01/2021 CLINICAL DATA:  Hypoglycemia, fall EXAM: PORTABLE CHEST 1 VIEW COMPARISON:  05/17/2021 FINDINGS: Limited exam secondary to poor penetration related to patient body habitus. The heart size and mediastinal contours are within normal limits. No focal airspace consolidation, pleural effusion, or pneumothorax. The visualized skeletal structures are unremarkable. IMPRESSION: No active disease. Electronically Signed   By: Davina Poke D.O.   On: 06/01/2021 11:53    Procedures Procedures    Medications Ordered in ED Medications - No data to display  ED Course/ Medical Decision Making/ A&P                           Medical Decision Making Amount and/or Complexity of Data Reviewed Labs: ordered. Radiology:  ordered.   This patient presents to the ED for concern of fall, this involves a number of treatment options, and is a complaint that carries with it a high  risk of complications and morbidity.  The differential diagnosis includes syncope, hypoglycemia, CVA.    Co morbidities: Discussed in HPI   Brief History:  Patient with extensive past medical history including diabetes presents to the ED status post fall.  According to EMS patient's blood sugar was found to be in the 60s on arrival to the scene.  She was given p.o. intake in order to  bring blood sugar down.  Patient has had multiple falls in the last couple weeks.  She recently got discharged from the hospital after her COVID-19 infection.  EMR reviewed including pt PMHx, past surgical history and past visits to ER.   See HPI for more details   Lab Tests:  I ordered and independently interpreted labs.  The pertinent results include:    Interpretation of her blood work by me reveal a CMP with slight decrease in her sodium, her creatinine level is .  From her baseline.  LFTs remarkable for slight elevation on her AST.  Lipase level is slightly elevated.  CBC with no leukocytosis, hemoglobin is within normal limits.  VBG was obtained which was otherwise within normal limits.  Patient does have nystagmus present on exam, no prior history of thyroid disease.  TSH was ordered which was normal.   Imaging Studies: Xray of the chest without any acute finding.   CT head: 1. No acute intracranial pathology. 2. Unchanged encephalomalacia in the anterior temporal lobes, which may reflect sequela of prior trauma.  Cardiac Monitoring:  The patient was maintained on a cardiac monitor.  I personally viewed and interpreted the cardiac monitored which showed an underlying rhythm of: NSR EKG non-ischemic  Reevaluation:  After the interventions noted above I re-evaluated patient and found that they have :stayed the same   Social  Determinants of Health:  Social work/case management involved Patient currently lives at home with her 97 year old mother who is unable to care for her.  According to Peri Maris that she would need social work involvement ordered for patient to be placed, she cannot take care of herself.   Problem List / ED Course:  Here with multiple falls, extensive medical history needing placement.  Work-up is overall reassuring.    Dispostion:  After consideration of the diagnostic results and the patients response to treatment, I feel that the patent would benefit from placement, as patient is not safe for disposition home as she has no one to take care of her.   2:39 PM Spoke to sister [name] who will like social work, she cannot take care of herself at home. Lives with mother who is 21 years old. Has has 5 falls in the last week, they have called fire department who 8-9 after she falls to the ground and gets picked up back to the chair.   [place] calls a place to social work, unfortunately I was unable to reach them.  I do feel that patient is not safe for disposition home at this time.  She is also pending PT evaluation.  Patient care signed out to incoming provider.  3:38 PM Spoke to [place], social work who will try to have patient placement under short term for SNIF placement.     Portions of this note were generated with Lobbyist. Dictation errors may occur despite best attempts at proofreading.   Final Clinical Impression(s) / ED Diagnoses Final diagnoses:  Fall, initial encounter    Rx / DC Orders ED Discharge Orders     None         Janeece Fitting, PA-C 06/01/21 [place], Jasmin Trumbull, PA-C 06/01/21 1538    [address] [place], PA-C 06/01/21 1539    Daleen Bo, MD 06/05/21 1313

## 2021-06-01 NOTE — ED Notes (Signed)
Pt given 16oz apple juice, graham crackers, and peanut butter. Pt alert and oriented x 4.

## 2021-06-01 NOTE — Assessment & Plan Note (Addendum)
Continue Synthroid.  TSH normal.  Free T4 mildly elevated.  Monitor.  Recommend recheck in 1 month.

## 2021-06-01 NOTE — Assessment & Plan Note (Addendum)
BMI more than 40. Placing the patient at high risk of poor outcome. On [facility] outpatient.  Currently on hold.

## 2021-06-01 NOTE — Evaluation (Signed)
Physical Therapy Evaluation Patient Details Name: Makayla [name] MRN: [id number] DOB: 1965/10/26 Today's Date: 06/01/2021  History of Present Illness  56 year old female with diabetes mellitus, CKD stage IIIb, obesity, neuropathy, hypothyroidism, hypertension, hyperlipidemia.  Pt with recent admissions 03/21/21, 04/12/21, and most recently 05/17/21 for Sepsis with bacteremia and Covid-19 . Pt presented 06/01/21 with multiple falls & hypoglycemia.  Clinical Impression  Pt admitted with above diagnosis. Due to multiple recent falls and Pt's CBG was 36 just prior to PT session, out of bed mobility was not attempted. Pt required +2 total assist to scoot to the R in bed. RN reported pt required max to total assist for supine to sit and to transfer to a bedside commode earlier today. Pt is a very high fall risk and family has not been able to care for her at home in her present state. SNF recommended, pt is agreeable. Resting nystagmus in both eyes was noted, pt reports this is baseline and that she has glasses at home to help with this issue. Pt currently with functional limitations due to the deficits listed below (see PT Problem List). Pt will benefit from skilled PT to increase their independence and safety with mobility to allow discharge to the venue listed below.          Recommendations for follow up therapy are one component of a multi-disciplinary discharge planning process, led by the attending physician.  Recommendations may be updated based on patient status, additional functional criteria and insurance authorization.  Follow Up Recommendations Skilled nursing-short term rehab (<3 hours/day)    Assistance Recommended at Discharge Frequent or constant Supervision/Assistance  Patient can return home with the following  Assist for transportation;Help with stairs or ramp for entrance;Two people to help with walking and/or transfers;Two people to help with bathing/dressing/bathroom;Assistance with  cooking/housework    Equipment Recommendations None recommended by PT  Recommendations for Other Services       Functional Status Assessment Patient has had a recent decline in their functional status and demonstrates the ability to make significant improvements in function in a reasonable and predictable amount of time.     Precautions / Restrictions Precautions Precautions: Fall Precaution Comments: per chart pt's sister stated pt had 5 falls in past 1 week, pt reported 3 falls in past week Restrictions Weight Bearing Restrictions: No      Mobility  Bed Mobility Overal bed mobility: Needs Assistance       Supine to sit: Max assist, HOB elevated     General bed mobility comments: max assist to raise trunk with nurse earlier today, did not attempt mobility 2* CBG 36 just prior to PT eval; +2 total assist to scoot pt to right in bed.    Transfers Overall transfer level: Needs assistance   Transfers: Bed to chair/wheelchair/BSC     Step pivot transfers: Max assist       General transfer comment: max assist with RW to transfer to 3 in 1 with heavy reliance upon BUE support earlier today, did not attempt this PT session due to CBG 36 and h/o multiple recent falls    Ambulation/Gait                  Stairs            Wheelchair Mobility    Modified Rankin (Stroke Patients Only)       Balance  Pertinent Vitals/Pain Pain Assessment Pain Assessment: No/denies pain    Home Living Family/patient expects to be discharged to:: Private residence Living Arrangements: Parent   Type of Home: Apartment Home Access: Stairs to enter Entrance Stairs-Rails: None Entrance Stairs-Number of Steps: 2   Home Layout: One level Home Equipment: Conservation officer, nature (2 wheels);Cane - single point;Wheelchair - manual Additional Comments: pt lives with her mother    Prior Function Prior Level of Function :  Independent/Modified Independent             Mobility Comments: pt reports she was able to walk at home with a RW ADLs Comments: for the past month she's required assist for bed baths, prior to that she'd get into a tub     Hand Dominance        Extremity/Trunk Assessment   Upper Extremity Assessment Upper Extremity Assessment: Generalized weakness    Lower Extremity Assessment Lower Extremity Assessment: Generalized weakness;RLE deficits/detail;LLE deficits/detail (pt reports sensation is intact to light touch BLEs) RLE Deficits / Details: able to perform heel slide actively, could not perform SLR, assessment limited due to body habitus RLE Sensation: WNL LLE Deficits / Details: min assist to perform heel slide, assessment limited due to body habitus (pannus limits hip flexion) LLE Sensation: WNL       Communication   Communication: No difficulties  Cognition Arousal/Alertness: Awake/alert Behavior During Therapy: WFL for tasks assessed/performed Overall Cognitive Status: Within Functional Limits for tasks assessed                                          General Comments General comments (skin integrity, edema, etc.): noted nystagmus B eyes at rest, pt stated this is baseline and she has glasses at home to help with this    Exercises  Ankle pumps x 10 both supine   Assessment/Plan    PT Assessment Patient needs continued PT services  PT Problem List Decreased strength;Decreased activity tolerance;Decreased balance;Decreased mobility;Decreased knowledge of precautions;Decreased knowledge of use of DME;Obesity       PT Treatment Interventions DME instruction;Gait training;Balance training;Therapeutic exercise;Stair training;Functional mobility training;Therapeutic activities;Patient/family education    PT Goals (Current goals can be found in the Care Plan section)  Acute Rehab PT Goals Patient Stated Goal: to go to rehab PT Goal Formulation:  With patient Time For Goal Achievement: 06/15/21 Potential to Achieve Goals: Fair    Frequency Min 2X/week     Co-evaluation               AM-PAC PT "6 Clicks" Mobility  Outcome Measure Help needed turning from your back to your side while in a flat bed without using bedrails?: Total Help needed moving from lying on your back to sitting on the side of a flat bed without using bedrails?: Total Help needed moving to and from a bed to a chair (including a wheelchair)?: Total Help needed standing up from a chair using your arms (e.g., wheelchair or bedside chair)?: Total Help needed to walk in hospital room?: Total Help needed climbing 3-5 steps with a railing? : Total 6 Click Score: 6    End of Session   Activity Tolerance: Patient tolerated treatment well;No increased pain Patient left: with call bell/phone within reach;in bed Nurse Communication: Mobility status;Need for lift equipment PT Visit Diagnosis: Other abnormalities of gait and mobility (R26.89);Muscle weakness (generalized) (M62.81);History of falling (Z91.81);Repeated falls (R29.6);Difficulty in  walking, not elsewhere classified (R26.2)    Time: 5749-3552 PT Time Calculation (min) (ACUTE ONLY): 16 min   Charges:   PT Evaluation $PT Eval Moderate Complexity: 1 Mod         Philomena Doheny PT 06/01/2021  Acute Rehabilitation Services Pager [phone] Office [phone]

## 2021-06-01 NOTE — Assessment & Plan Note (Signed)
PT OT consulted.  Recommend SNF.

## 2021-06-01 NOTE — TOC Progression Note (Signed)
Transition of Care [facility]) - Progression Note    Patient Details  Name: Makayla [name] MRN: [id number] Date of Birth: 20-Jun-1965  Transition of [facility]) CM/SW [place], [place] Phone Number: 06/01/2021, 7:28 PM  Clinical Narrative:     CSW faxed pt out to [facility] SNF's at request of pt's sister. Insurance Josem Kaufmann will need to be started. FL2 completed.        Expected Discharge Plan and Services                                                 Social Determinants of Health (SDOH) Interventions    Readmission Risk Interventions No flowsheet data found.

## 2021-06-01 NOTE — Assessment & Plan Note (Signed)
Continue statin. 

## 2021-06-01 NOTE — ED Notes (Signed)
Janeece Fitting, PA aware CBG 67. Pt verbal.

## 2021-06-01 NOTE — Assessment & Plan Note (Addendum)
Ataxia Horizontal nystagmus Patient reports 5 falls.  Most likely the falls are secondary to hypoglycemic episodes. Last night's fall was reported as she went to sleep when she stood up! Currently does not have any focal deficit but have bilateral tremors as well as abnormal past-pointing and horizontal nystagmus. CT head as well as MRI brain negative for any acute stroke.  PT recommends SNF.

## 2021-06-02 ENCOUNTER — Other Ambulatory Visit: Payer: Self-pay

## 2021-06-02 ENCOUNTER — Telehealth: Payer: Medicare Other | Admitting: Infectious Diseases

## 2021-06-02 DIAGNOSIS — E1140 Type 2 diabetes mellitus with diabetic neuropathy, unspecified: Secondary | ICD-10-CM | POA: Diagnosis present

## 2021-06-02 DIAGNOSIS — E162 Hypoglycemia, unspecified: Secondary | ICD-10-CM | POA: Diagnosis present

## 2021-06-02 DIAGNOSIS — E1122 Type 2 diabetes mellitus with diabetic chronic kidney disease: Secondary | ICD-10-CM | POA: Diagnosis present

## 2021-06-02 DIAGNOSIS — E1169 Type 2 diabetes mellitus with other specified complication: Secondary | ICD-10-CM | POA: Diagnosis present

## 2021-06-02 DIAGNOSIS — I129 Hypertensive chronic kidney disease with stage 1 through stage 4 chronic kidney disease, or unspecified chronic kidney disease: Secondary | ICD-10-CM | POA: Diagnosis present

## 2021-06-02 DIAGNOSIS — R296 Repeated falls: Secondary | ICD-10-CM | POA: Diagnosis present

## 2021-06-02 DIAGNOSIS — Z7984 Long term (current) use of oral hypoglycemic drugs: Secondary | ICD-10-CM | POA: Diagnosis not present

## 2021-06-02 DIAGNOSIS — E039 Hypothyroidism, unspecified: Secondary | ICD-10-CM | POA: Diagnosis present

## 2021-06-02 DIAGNOSIS — Z79890 Hormone replacement therapy (postmenopausal): Secondary | ICD-10-CM | POA: Diagnosis not present

## 2021-06-02 DIAGNOSIS — Z886 Allergy status to analgesic agent status: Secondary | ICD-10-CM | POA: Diagnosis not present

## 2021-06-02 DIAGNOSIS — Z20822 Contact with and (suspected) exposure to covid-19: Secondary | ICD-10-CM | POA: Diagnosis present

## 2021-06-02 DIAGNOSIS — Z888 Allergy status to other drugs, medicaments and biological substances status: Secondary | ICD-10-CM | POA: Diagnosis not present

## 2021-06-02 DIAGNOSIS — L89322 Pressure ulcer of left buttock, stage 2: Secondary | ICD-10-CM | POA: Diagnosis present

## 2021-06-02 DIAGNOSIS — Z794 Long term (current) use of insulin: Secondary | ICD-10-CM | POA: Diagnosis not present

## 2021-06-02 DIAGNOSIS — Z8616 Personal history of COVID-19: Secondary | ICD-10-CM | POA: Diagnosis not present

## 2021-06-02 DIAGNOSIS — Z79899 Other long term (current) drug therapy: Secondary | ICD-10-CM | POA: Diagnosis not present

## 2021-06-02 DIAGNOSIS — N1832 Chronic kidney disease, stage 3b: Secondary | ICD-10-CM | POA: Diagnosis present

## 2021-06-02 DIAGNOSIS — Z83438 Family history of other disorder of lipoprotein metabolism and other lipidemia: Secondary | ICD-10-CM | POA: Diagnosis not present

## 2021-06-02 DIAGNOSIS — E11649 Type 2 diabetes mellitus with hypoglycemia without coma: Secondary | ICD-10-CM | POA: Diagnosis present

## 2021-06-02 DIAGNOSIS — L89892 Pressure ulcer of other site, stage 2: Secondary | ICD-10-CM | POA: Diagnosis present

## 2021-06-02 DIAGNOSIS — Z6841 Body Mass Index (BMI) 40.0 and over, adult: Secondary | ICD-10-CM | POA: Diagnosis not present

## 2021-06-02 DIAGNOSIS — Z880 Allergy status to penicillin: Secondary | ICD-10-CM | POA: Diagnosis not present

## 2021-06-02 DIAGNOSIS — E1165 Type 2 diabetes mellitus with hyperglycemia: Secondary | ICD-10-CM | POA: Diagnosis not present

## 2021-06-02 DIAGNOSIS — E785 Hyperlipidemia, unspecified: Secondary | ICD-10-CM | POA: Diagnosis present

## 2021-06-02 LAB — COMPREHENSIVE METABOLIC PANEL
ALT: 23 U/L (ref 0–44)
AST: 36 U/L (ref 15–41)
Albumin: 2.4 g/dL — ABNORMAL LOW (ref 3.5–5.0)
Alkaline Phosphatase: 85 U/L (ref 38–126)
Anion gap: 6 (ref 5–15)
BUN: 10 mg/dL (ref 6–20)
CO2: 22 mmol/L (ref 22–32)
Calcium: 8.2 mg/dL — ABNORMAL LOW (ref 8.9–10.3)
Chloride: 105 mmol/L (ref 98–111)
Creatinine, Ser: 1.07 mg/dL — ABNORMAL HIGH (ref 0.44–1.00)
GFR, Estimated: >60 mL/min (ref >60)
Glucose, Bld: 96 mg/dL (ref 70–99)
Potassium: 4.5 mmol/L (ref 3.5–5.1)
Sodium: 133 mmol/L — ABNORMAL LOW (ref 135–145)
Total Bilirubin: 0.4 mg/dL (ref 0.3–1.2)
Total Protein: 5.9 g/dL — ABNORMAL LOW (ref 6.5–8.1)

## 2021-06-02 LAB — URINALYSIS, ROUTINE W REFLEX MICROSCOPIC
Bilirubin Urine: NEGATIVE
Glucose, UA: NEGATIVE mg/dL
Ketones, ur: NEGATIVE mg/dL
Nitrite: NEGATIVE
Protein, ur: 30 mg/dL — AB
Specific Gravity, Urine: 1.009 (ref 1.005–1.030)
WBC, UA: >50 WBC/hpf — ABNORMAL HIGH (ref 0–5)
pH: 8 (ref 5.0–8.0)

## 2021-06-02 LAB — CBC
HCT: 31.2 % — ABNORMAL LOW (ref 36.0–46.0)
Hemoglobin: 9.6 g/dL — ABNORMAL LOW (ref 12.0–15.0)
MCH: 27.1 pg (ref 26.0–34.0)
MCHC: 30.8 g/dL (ref 30.0–36.0)
MCV: 88.1 fL (ref 80.0–100.0)
Platelets: 181 10*3/uL (ref 150–400)
RBC: 3.54 MIL/uL — ABNORMAL LOW (ref 3.87–5.11)
RDW: 18.2 % — ABNORMAL HIGH (ref 11.5–15.5)
WBC: 5.9 10*3/uL (ref 4.0–10.5)
nRBC: 0 % (ref 0.0–0.2)

## 2021-06-02 LAB — GLUCOSE, CAPILLARY
Glucose-Capillary: 116 mg/dL — ABNORMAL HIGH (ref 70–99)
Glucose-Capillary: 128 mg/dL — ABNORMAL HIGH (ref 70–99)
Glucose-Capillary: 208 mg/dL — ABNORMAL HIGH (ref 70–99)
Glucose-Capillary: 243 mg/dL — ABNORMAL HIGH (ref 70–99)
Glucose-Capillary: 56 mg/dL — ABNORMAL LOW (ref 70–99)
Glucose-Capillary: 73 mg/dL (ref 70–99)
Glucose-Capillary: 77 mg/dL (ref 70–99)
Glucose-Capillary: 89 mg/dL (ref 70–99)

## 2021-06-02 LAB — RAPID URINE DRUG SCREEN, HOSP PERFORMED
Amphetamines: NOT DETECTED
Barbiturates: NOT DETECTED
Benzodiazepines: NOT DETECTED
Cocaine: NOT DETECTED
Opiates: NOT DETECTED
Tetrahydrocannabinol: NOT DETECTED

## 2021-06-02 MED ORDER — DEXTROSE 10 % IV SOLN: INTRAVENOUS | Status: DC

## 2021-06-02 NOTE — Plan of Care (Signed)
?  Problem: Clinical Measurements: ?Goal: Diagnostic test results will improve ?Outcome: Progressing ?  ?Problem: Safety: ?Goal: Ability to remain free from injury will improve ?Outcome: Progressing ?  ?

## 2021-06-02 NOTE — Hospital Course (Addendum)
Makayla [name] is a 56 y.o. female with medical history significant of type 2 diabetes mellitus, CKD 3B, chronic neuropathy, HTN, HLD, morbid obesity.  Patient presented with complaints of confusion and low sugar as well as recurrent falls. Found to have hypoglycemia. 2/23 started on D5 LR. 2/24 started on D10 due to ongoing hypoglycemic episodes. 2/25 D10 discontinued.  Started on sensitive sliding scale. 2/26 started on 8 units of Lantus. 2/28 medically stable for transfer to SNF when a bed is available.

## 2021-06-02 NOTE — Progress Notes (Addendum)
Hypoglycemic Event  CBG: 59  Treatment: 4 oz juice/soda  Symptoms: None  Follow-up CBG: Time:0028 CBG Result:56  Possible Reasons for Event: Unknown  Comments/MD notified: [place] NP notified and modified orders to D5/LR running at 55ml/hr, increased to 176ml/hr. Notified MD of follow up CBG.  Treated Pt a second time with 8 oz juice and new orders for D10 at 67ml/hr. Reassessed and CBG now 77.   Lonia Skinner

## 2021-06-02 NOTE — Assessment & Plan Note (Signed)
Stage II left buttock. Continue foam dressing.

## 2021-06-02 NOTE — Evaluation (Signed)
Occupational Therapy Evaluation Patient Details Name: Makayla [name] MRN: [id number] DOB: 1965-09-15 Today's Date: 06/02/2021   History of Present Illness 56 year old female with diabetes mellitus, CKD stage IIIb, obesity, neuropathy, hypothyroidism, hypertension, hyperlipidemia.  Pt with recent admissions 03/21/21, 04/12/21, and most recently 05/17/21 for Sepsis with bacteremia and Covid-19 . Pt presented 06/01/21 with multiple falls & hypoglycemia.   Clinical Impression   [name] is a 56 year old woman who presents with generalized weakness, decreased activity tolerance, impaired balance, pain in RLE and morbid obesity. Patient reports prior to COVID she was independent with ADLs, standing in shower to bathe and ambulate to ambulate with rolator in grocery store. Today patient required +2 assistance to stand and only ambulate approximately 8 feet with a chair follow. She needs max-total assist for LB dressing and toileting. Patient will benefit from skilled OT services while in hospital to improve deficits and learn compensatory strategies as needed in order to return to PLOF.  Patient will benefit from short term rehab prior to return home.      Recommendations for follow up therapy are one component of a multi-disciplinary discharge planning process, led by the attending physician.  Recommendations may be updated based on patient status, additional functional criteria and insurance authorization.   Follow Up Recommendations  Skilled nursing-short term rehab (<3 hours/day)    Assistance Recommended at Discharge Frequent or constant Supervision/Assistance  Patient can return home with the following A lot of help with walking and/or transfers;A lot of help with bathing/dressing/bathroom;Assistance with cooking/housework;Help with stairs or ramp for entrance    Functional Status Assessment  Patient has had a recent decline in their functional status and demonstrates the ability to make  significant improvements in function in a reasonable and predictable amount of time.  Equipment Recommendations  Tub/shower bench    Recommendations for Other Services       Precautions / Restrictions Precautions Precautions: Fall Precaution Comments: per chart pt's sister stated pt had 5 falls in past 1 week, pt reported 3 falls in past week Restrictions Weight Bearing Restrictions: No      Mobility Bed Mobility                    Transfers                          Balance Overall balance assessment: Needs assistance Sitting-balance support: No upper extremity supported, Feet supported Sitting balance-Leahy Scale: Fair     Standing balance support: Reliant on assistive device for balance Standing balance-Leahy Scale: Poor Standing balance comment: reliant on device                           ADL either performed or assessed with clinical judgement   ADL Overall ADL's : Needs assistance/impaired Eating/Feeding: Independent   Grooming: Set up;Sitting   Upper Body Bathing: Set up;Sitting   Lower Body Bathing: Maximal assistance;Sitting/lateral leans   Upper Body Dressing : Set up;Sitting   Lower Body Dressing: Total assistance;Sit to/from stand   Toilet Transfer: +2 for safety/equipment;+2 for physical assistance;Moderate assistance Toilet Transfer Details (indicate cue type and reason): MIn assist x 2 on each side of patient to power up [place] and Hygiene: Maximal assistance;Sitting/lateral lean       Functional mobility during ADLs: Minimal assistance;+2 for safety/equipment;+2 for physical assistance General ADL Comments: Patient up in chair. Patient min assist x 2  to stand with a combined effort of mod assist and increased time to power up. Patient able to ambulate 8 feet with RW. Reports pain in RLE and history of gout. Patient fatigued quickly and started bracing herself on the sink instead of walker.  Returned to seated position with difficulty scooting herself back in recliner.     Vision Patient Visual Report: No change from baseline Additional Comments: Exhibits nystagmus of both eyes - patient reports this is normal     Perception     Praxis      Pertinent Vitals/Pain Pain Assessment Pain Assessment: Faces Faces Pain Scale: Hurts a little bit Pain Location: R leg Pain Descriptors / Indicators: Grimacing Pain Intervention(s): Monitored during session     Hand Dominance Right   Extremity/Trunk Assessment Upper Extremity Assessment Upper Extremity Assessment: RUE deficits/detail;LUE deficits/detail RUE Deficits / Details: WFL ROM though shoulder ROM decreased, overall 4/5 strength except 4-/5 grip strength RUE Sensation: history of peripheral neuropathy RUE Coordination: WNL LUE Deficits / Details: WFL ROM, overall 4/5 strength except 4-/5 grip strength LUE Sensation: history of peripheral neuropathy LUE Coordination: WNL   Lower Extremity Assessment Lower Extremity Assessment: Defer to PT evaluation   Cervical / Trunk Assessment Cervical / Trunk Exceptions: body habitus limiting, pannus shifted to the right   Communication Communication Communication: No difficulties   Cognition Arousal/Alertness: Awake/alert Behavior During Therapy: WFL for tasks assessed/performed Overall Cognitive Status: Within Functional Limits for tasks assessed                                 General Comments: Alert and oriented. Able to report on PLOF but did change her story when therapist reiterating how she bathed.     General Comments       Exercises     Shoulder Instructions      Home Living Family/patient expects to be discharged to:: Private residence Living Arrangements: Parent Available Help at Discharge: Family;Friend(s) Type of Home: Apartment Home Access: Stairs to enter Entrance Stairs-Number of Steps: 2 Entrance Stairs-Rails: None Home Layout:  One level     Bathroom Shower/Tub: Teacher, early years/pre: Standard     Home Equipment: Conservation officer, nature (2 wheels);Cane - single point;IT sales professional (4 wheels)   Additional Comments: pt lives with her mother      Prior Functioning/Environment Prior Level of Function : Independent/Modified Independent             Mobility Comments: pt reports she was able to walk at home with a RW ADLs Comments: reports she can dress herself, doing sink baths recently        OT Problem List: Decreased strength;Decreased activity tolerance;Impaired balance (sitting and/or standing);Decreased knowledge of use of DME or AE;Pain;Obesity;Impaired sensation      OT Treatment/Interventions: Self-care/ADL training;Therapeutic exercise;DME and/or AE instruction;Therapeutic activities;Balance training;Patient/family education    OT Goals(Current goals can be found in the care plan section) Acute Rehab OT Goals Patient Stated Goal: To get stronger OT Goal Formulation: With patient Time For Goal Achievement: 06/16/21 Potential to Achieve Goals: Good  OT Frequency: Min 2X/week    Co-evaluation              AM-PAC OT "6 Clicks" Daily Activity     Outcome Measure Help from another person eating meals?: None Help from another person taking care of personal grooming?: A Little Help from another person toileting, which includes using toliet, bedpan,  or urinal?: A Lot Help from another person bathing (including washing, rinsing, drying)?: A Lot Help from another person to put on and taking off regular upper body clothing?: A Little Help from another person to put on and taking off regular lower body clothing?: Total 6 Click Score: 15   End of Session Equipment Utilized During Treatment: Rolling walker (2 wheels) Nurse Communication: Mobility status  Activity Tolerance: Patient tolerated treatment well Patient left: in chair;with call bell/phone within reach  OT Visit  Diagnosis: Muscle weakness (generalized) (M62.81);Other abnormalities of gait and mobility (R26.89)                Time: 3748-2707 OT Time Calculation (min): 17 min Charges:  OT General Charges $OT Visit: 1 Visit OT Evaluation $OT Eval Low Complexity: 1 Low  Tarin Johndrow, OTR/L [place]  Office [phone] Pager: [place] 06/02/2021, 11:26 AM

## 2021-06-02 NOTE — Progress Notes (Signed)
°  Progress Note   Patient: Makayla [name] YQM:[id number] DOB: 09/17/1965 DOA: 06/01/2021     Hospitalization day: 0 DOS: the patient was seen and examined on 06/02/2021   Brief hospital course: Makayla [name] is a 56 y.o. female with medical history significant of type 2 diabetes mellitus, CKD 3B, chronic neuropathy, HTN, HLD, morbid obesity.  Patient presented with complaints of confusion and low sugar as well as recurrent falls. Found to have hypoglycemia.  Assessment and Plan: * Hypoglycemia- (present on admission) Due to excessive insulin use with poor p.o. 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 Patient presents with complaints of recurrent falls. CBG was significantly low. Despite providing oral glucose CBG remains low and therefore patient was referred for admission. We will check hemoglobin A1c.  Recent hemoglobin 7.3. Hold all insulin as well as oral hypoglycemic agents. CBG every 4 hours. Currently on D10.  Monitor.  Unwitnessed fall- (present on admission) Ataxia Horizontal nystagmus Patient reports 5 falls.  Most likely the falls are secondary to hypoglycemic episodes. Last night's fall was reported as she went to sleep when she stood up! Currently does not have any focal deficit but have bilateral tremors as well as abnormal past-pointing and horizontal nystagmus. CT head as well as MRI brain negative for any acute stroke.  PT recommends SNF.  Ataxia, horizontal nystagmus PT OT consulted.  Recommend SNF.  Pressure injury of skin- (present on admission) Stage II left buttock. Continue foam dressing.  Hypothyroidism- (present on admission) Continue Synthroid.  TSH normal.  Stage 3b chronic kidney disease (CKD) ([place])- (present on admission) Baseline serum creatinine 1.3.  Currently serum creatinine 1.0. GFR more than 30. Monitor avoid nephrotoxic medication.  Obesity, morbid ([place])- (present on  admission) BMI more than 40. Placing the patient at high risk of poor outcome. On [place] outpatient.  Currently on hold.  Hyperlipidemia associated with type 2 diabetes mellitus ([place])- (present on admission) Continue statin.        Subjective: No nausea no vomiting no fever no chills.  No dizziness or lightheadedness.  No focal deficit.  Eating still poor.  Physical Exam: Vitals:   06/01/21 2359 06/02/21 0402 06/02/21 1024 06/02/21 1144  BP: 123/70 118/67 99/64   Pulse: 96 (!) 102 93   Resp: 19 19 19    Temp: 99 F (37.2 C) 99.5 F (37.5 C) 98.1 F (36.7 C)   TempSrc: Oral Oral Oral   SpO2: 98% 98% 100% 99%   General: Appear in mild distress; no visible Abnormal Neck Mass Or lumps, Conjunctiva normal Cardiovascular: S1 and S2 Present, no Murmur, Respiratory: good respiratory effort, Bilateral Air entry present and CTA, no Crackles, no wheezes Abdomen: Bowel Sound present Extremities: no Pedal edema Neurology: alert and oriented to time, place, and person Gait not checked due to patient safety concerns   Data Reviewed:  I have Reviewed nursing notes, Vitals, and Lab results since pt's last encounter. Pertinent lab results CBC, BMP I have ordered test including CBC, BMP    Family Communication: None at bedside  Disposition: Status is: Inpatient Remains inpatient appropriate because: Ongoing requirement for IV D10 infusion given poor p.o. intake and hypoglycemia in a diabetic patient  Author: Berle Mull, MD 06/02/2021 6:23 PM  For on call review [email or web address].

## 2021-06-03 ENCOUNTER — Encounter (HOSPITAL_COMMUNITY): Payer: Self-pay | Admitting: Internal Medicine

## 2021-06-03 DIAGNOSIS — E162 Hypoglycemia, unspecified: Secondary | ICD-10-CM | POA: Diagnosis not present

## 2021-06-03 LAB — GLUCOSE, CAPILLARY
Glucose-Capillary: 136 mg/dL — ABNORMAL HIGH (ref 70–99)
Glucose-Capillary: 167 mg/dL — ABNORMAL HIGH (ref 70–99)
Glucose-Capillary: 236 mg/dL — ABNORMAL HIGH (ref 70–99)
Glucose-Capillary: 286 mg/dL — ABNORMAL HIGH (ref 70–99)
Glucose-Capillary: 287 mg/dL — ABNORMAL HIGH (ref 70–99)

## 2021-06-03 LAB — HEMOGLOBIN A1C
Hgb A1c MFr Bld: 6.8 % — ABNORMAL HIGH (ref 4.8–5.6)
Mean Plasma Glucose: 148 mg/dL

## 2021-06-03 MED ORDER — INSULIN ASPART 100 UNIT/ML IJ SOLN
0.0000 [IU] | Freq: Three times a day (TID) | INTRAMUSCULAR | Status: DC
  Administered 2021-06-04: 2 [IU] via SUBCUTANEOUS
  Administered 2021-06-04 – 2021-06-05 (×4): 3 [IU] via SUBCUTANEOUS
  Administered 2021-06-05 – 2021-06-06 (×2): 2 [IU] via SUBCUTANEOUS
  Administered 2021-06-06: 7 [IU] via SUBCUTANEOUS
  Administered 2021-06-06: 2 [IU] via SUBCUTANEOUS
  Administered 2021-06-07: 3 [IU] via SUBCUTANEOUS
  Administered 2021-06-07: 7 [IU] via SUBCUTANEOUS

## 2021-06-03 MED ORDER — INSULIN ASPART 100 UNIT/ML IJ SOLN
0.0000 [IU] | Freq: Every day | INTRAMUSCULAR | Status: DC
  Administered 2021-06-03 – 2021-06-04 (×2): 3 [IU] via SUBCUTANEOUS
  Administered 2021-06-05 – 2021-06-06 (×2): 2 [IU] via SUBCUTANEOUS

## 2021-06-03 NOTE — Progress Notes (Signed)
°  Progress Note   Patient: Makayla [name] KZS:[id number] DOB: [birth year] DOA: 06/01/2021     Hospitalization day: 1 DOS: the patient was seen and examined on 06/03/2021   Brief hospital course: Makayla [name] is a 56 y.o. female with medical history significant of type 2 diabetes mellitus, CKD 3B, chronic neuropathy, HTN, HLD, morbid obesity.  Patient presented with complaints of confusion and low sugar as well as recurrent falls. Found to have hypoglycemia.  Assessment and Plan: * Hypoglycemia- (present on admission) Due to excessive insulin use with poor p.o. 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 Patient presents with complaints of recurrent falls. CBG was significantly low. Despite providing oral glucose CBG remains low and therefore patient was referred for admission. Hemoglobin A1c 6.8. Blood sugar has improved.  Therefore will discontinue D10. Most likely will initiate sliding scale.  Unwitnessed fall- (present on admission) Ataxia Horizontal nystagmus Patient reports 5 falls.  Most likely the falls are secondary to hypoglycemic episodes. Last night's fall was reported as she went to sleep when she stood up! Currently does not have any focal deficit but have bilateral tremors as well as abnormal past-pointing and horizontal nystagmus. CT head as well as MRI brain negative for any acute stroke.  PT recommends SNF.  Ataxia, horizontal nystagmus PT OT consulted.  Recommend SNF.  Pressure injury of skin- (present on admission) Stage II left buttock. Continue foam dressing.  Hypothyroidism- (present on admission) Continue Synthroid.  TSH normal.  Free T4 mildly elevated.  Monitor.  Recommend recheck in 1 month.  Stage 3b chronic kidney disease (CKD) ([place])- (present on admission) Baseline serum creatinine 1.3.  Currently serum creatinine 1.0. GFR more than 30. Monitor avoid nephrotoxic  medication.  Obesity, morbid ([place])- (present on admission) BMI more than 40. Placing the patient at high risk of poor outcome. On [place] outpatient.  Currently on hold.  Hyperlipidemia associated with type 2 diabetes mellitus ([place])- (present on admission) Continue statin.  Subjective: No nausea no vomiting oral intake improving.  No diarrhea no constipation.  Physical Exam: Vitals:   06/03/21 0450 06/03/21 0750 06/03/21 1100 06/03/21 1956  BP: (!) 144/69  (!) 106/57   Pulse: 96  100   Resp: 18  16   Temp: 98.9 F (37.2 C)  98.6 F (37 C)   TempSrc: Oral  Oral   SpO2: 100% 100% 100% 96%   General: Appear in mild distress; no visible Abnormal Neck Mass Or lumps, Conjunctiva normal Cardiovascular: S1 and S2 Present, no Murmur, Respiratory: good respiratory effort, Bilateral Air entry present and CTA, no Crackles, no wheezes Abdomen: Bowel Sound present Extremities: no Pedal edema Neurology: alert and oriented to place and person Gait not checked due to patient safety concerns   Data Reviewed:  I have Reviewed nursing notes, Vitals, and Lab results since pt's last encounter. Pertinent lab results CBC and BMP I have ordered test including CBC and BMP    Family Communication: None at bedside  Disposition: Status is: Inpatient Remains inpatient appropriate because: Still hypoglycemic, requiring monitoring as patient at her baseline uses 56 units of insulin daily and currently on none.  Author: Berle Mull, MD 06/03/2021 8:07 PM  For on call review [email or web address].

## 2021-06-03 NOTE — Progress Notes (Signed)
Patient is not getting any IV medication or continue fluid. Talked to patient's RN regarding this matter. Not putting the PIV access at this time. Glenda Chroman RN will put in the consult, if patient need it. HS [employer]

## 2021-06-04 DIAGNOSIS — E162 Hypoglycemia, unspecified: Secondary | ICD-10-CM | POA: Diagnosis not present

## 2021-06-04 LAB — GLUCOSE, CAPILLARY
Glucose-Capillary: 171 mg/dL — ABNORMAL HIGH (ref 70–99)
Glucose-Capillary: 210 mg/dL — ABNORMAL HIGH (ref 70–99)
Glucose-Capillary: 207 mg/dL — ABNORMAL HIGH (ref 70–99)

## 2021-06-04 MED ORDER — INSULIN GLARGINE-YFGN 100 UNIT/ML ~~LOC~~ SOLN
8.0000 [IU] | Freq: Every day | SUBCUTANEOUS | Status: DC
Start: 2021-06-04 — End: 2021-06-07
  Administered 2021-06-04 – 2021-06-07 (×4): 8 [IU] via SUBCUTANEOUS
  Filled 2021-06-04 (×5): qty 0.08

## 2021-06-04 NOTE — Progress Notes (Signed)
°  Progress Note   Patient: Makayla [name] HTD:[id number] DOB: [birth year] DOA: 06/01/2021     Hospitalization day: 2 DOS: the patient was seen and examined on 06/04/2021   Brief hospital course: Leandrea Ackley is a 56 y.o. female with medical history significant of type 2 diabetes mellitus, CKD 3B, chronic neuropathy, HTN, HLD, morbid obesity.  Patient presented with complaints of confusion and low sugar as well as recurrent falls. Found to have hypoglycemia. 2/23 started on D5 LR. 2/24 started on D10 due to ongoing hypoglycemic episodes. 2/25 D10 discontinued.  Started on sensitive sliding scale. 2/26 started on 8 units of Lantus.  Assessment and Plan: * Hypoglycemia- (present on admission) Due to excessive insulin use with poor p.o. 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 Patient presents with complaints of recurrent falls. CBG was significantly low. Despite providing oral glucose CBG remains low and therefore patient was referred for admission. Hemoglobin A1c 6.8. Blood sugar has improved.  Now D10 have been discontinued. Continue sliding scale insulin.  Will initiate low-dose Levemir.   Unwitnessed fall- (present on admission) Ataxia Horizontal nystagmus Patient reports 5 falls.  Most likely the falls are secondary to hypoglycemic episodes. Last night's fall was reported as she went to sleep when she stood up! Currently does not have any focal deficit but have bilateral tremors as well as abnormal past-pointing and horizontal nystagmus. CT head as well as MRI brain negative for any acute stroke.  PT recommends SNF.  Ataxia, horizontal nystagmus PT OT consulted.  Recommend SNF.  Pressure injury of skin- (present on admission) Stage II left buttock. Continue foam dressing.  Hypothyroidism- (present on admission) Continue Synthroid.  TSH normal.  Free T4 mildly elevated.  Monitor.  Recommend recheck in 1  month.  Stage 3b chronic kidney disease (CKD) ([place])- (present on admission) Baseline serum creatinine 1.3.  Currently serum creatinine 1.0. GFR more than 30. Monitor avoid nephrotoxic medication.  Obesity, morbid ([place])- (present on admission) BMI more than 40. Placing the patient at high risk of poor outcome. On [place] outpatient.  Currently on hold.  Hyperlipidemia associated with type 2 diabetes mellitus ([place])- (present on admission) Continue statin.   Subjective: No nausea or vomiting no fever no chills.  No acute complaint.  Physical Exam: Vitals:   06/04/21 0533 06/04/21 0535 06/04/21 0802 06/04/21 1307  BP:  (!) 115/57  113/67  Pulse: 96 98  (!) 102  Resp: 16     Temp: 99.6 F (37.6 C)   97.6 F (36.4 C)  TempSrc: Oral   Oral  SpO2: 99% 99% 98% 99%   General: Appear in mild distress; no visible Abnormal Neck Mass Or lumps, Conjunctiva normal Cardiovascular: S1 and S2 Present, no Murmur, Respiratory: good respiratory effort, Bilateral Air entry present and CTA, no Crackles, no wheezes Abdomen: Bowel Sound present Extremities: no Pedal edema Neurology: alert and oriented to time, place, and person Gait not checked due to patient safety concerns   Data Reviewed:  I have Reviewed nursing notes, Vitals, and Lab results since pt's last encounter. Pertinent lab results CBC and BMP I have ordered test including BMP    Family Communication: None at bedside  Disposition: Status is: Inpatient Remains inpatient appropriate because: Unsafe discharge.  Needing SNF.  Author: Berle Mull, MD 06/04/2021 7:29 PM  For on call review [email or web address].

## 2021-06-05 LAB — BASIC METABOLIC PANEL
Anion gap: 4 — ABNORMAL LOW (ref 5–15)
BUN: 11 mg/dL (ref 6–20)
CO2: 23 mmol/L (ref 22–32)
Calcium: 8.1 mg/dL — ABNORMAL LOW (ref 8.9–10.3)
Chloride: 104 mmol/L (ref 98–111)
Creatinine, Ser: 1.06 mg/dL — ABNORMAL HIGH (ref 0.44–1.00)
GFR, Estimated: >60 mL/min (ref >60)
Glucose, Bld: 151 mg/dL — ABNORMAL HIGH (ref 70–99)
Potassium: 4.3 mmol/L (ref 3.5–5.1)
Sodium: 131 mmol/L — ABNORMAL LOW (ref 135–145)

## 2021-06-05 LAB — GLUCOSE, CAPILLARY
Glucose-Capillary: 273 mg/dL — ABNORMAL HIGH (ref 70–99)
Glucose-Capillary: 153 mg/dL — ABNORMAL HIGH (ref 70–99)
Glucose-Capillary: 206 mg/dL — ABNORMAL HIGH (ref 70–99)
Glucose-Capillary: 248 mg/dL — ABNORMAL HIGH (ref 70–99)
Glucose-Capillary: 218 mg/dL — ABNORMAL HIGH (ref 70–99)

## 2021-06-05 MED ORDER — CHLORZOXAZONE 500 MG PO TABS
250.0000 mg | ORAL_TABLET | Freq: Three times a day (TID) | ORAL | Status: DC | PRN
  Administered 2021-06-06: 250 mg via ORAL
  Filled 2021-06-05 (×2): qty 1

## 2021-06-05 NOTE — TOC Progression Note (Signed)
Transition of Care [facility]) - Progression Note    Patient Details  Name: Makayla [name] MRN: [id number] Date of Birth: 1965/09/04  Transition of Care [facility] CM/SW Contact  Purcell Mouton, RN Phone Number: 06/05/2021, 3:14 PM  Clinical Narrative:     Re-faxed pt out to facilities. Pt is from home with her 54 y.o. mother. When pt falls, [facility] are called out to assist with getting her off the floor. Pt's mother is unable to change dressings on pt's wounds.   Expected Discharge Plan: Skilled Nursing Facility Barriers to Discharge: Continued Medical Work up, Ship broker  Expected Discharge Plan and Services Expected Discharge Plan: [place]                                               Social Determinants of Health (SDOH) Interventions    Readmission Risk Interventions No flowsheet data found.

## 2021-06-05 NOTE — Progress Notes (Signed)
Physical Therapy Treatment Patient Details Name: Dilynn Munroe MRN: [id number] DOB: 07-21-1965 Today's Date: 06/05/2021   History of Present Illness 56 year old female with diabetes mellitus, CKD stage IIIb, obesity, neuropathy, hypothyroidism, hypertension, hyperlipidemia.  Pt with recent admissions 03/21/21, 04/12/21, and most recently 05/17/21 for Sepsis with bacteremia and Covid-19 . Pt presented 06/01/21 with multiple falls & hypoglycemia.    PT Comments    Patient making steady progress with mobility. Noted to have had BM and NT arrived to room to assist with +2 rolling and pericare. Pt required Mod +2 for safety to move to EOB and min +2 to rise with RW and step to recliner. Encouraged pt to spend more time OOB for meals. Recommend SNF rehab to progress independence. Will follow and progress as able in acute setting.    Recommendations for follow up therapy are one component of a multi-disciplinary discharge planning process, led by the attending physician.  Recommendations may be updated based on patient status, additional functional criteria and insurance authorization.  Follow Up Recommendations  Skilled nursing-short term rehab (<3 hours/day)     Assistance Recommended at Discharge Frequent or constant Supervision/Assistance  Patient can return home with the following Assist for transportation;Help with stairs or ramp for entrance;Two people to help with walking and/or transfers;Two people to help with bathing/dressing/bathroom;Assistance with cooking/housework   Equipment Recommendations  None recommended by PT    Recommendations for Other Services       Precautions / Restrictions Precautions Precautions: Fall Precaution Comments: per chart pt's sister stated pt had 5 falls in past 1 week, pt reported 3 falls in past week Restrictions Weight Bearing Restrictions: No     Mobility  Bed Mobility Overal bed mobility: Needs Assistance Bed Mobility: Supine to Sit,  Rolling Rolling: Mod assist, +2 for physical assistance, +2 for safety/equipment   Supine to sit: Mod assist, +2 for safety/equipment, HOB elevated     General bed mobility comments: Mod assist with cues for sequencing to [place] grab bar. Mod +2 for roll in bed to complete pericare.    Transfers Overall transfer level: Needs assistance Equipment used: Rolling walker (2 wheels) Transfers: Sit to/from Stand, Bed to chair/wheelchair/BSC Sit to Stand: Min assist, +2 safety/equipment, From elevated surface   Step pivot transfers: Min assist, +2 safety/equipment, From elevated surface       General transfer comment: Min assist for safety to steady and rise from EOB. assist to pivot walker and turn to recliner. +2 for safety.    Ambulation/Gait                   Stairs             Wheelchair Mobility    Modified Rankin (Stroke Patients Only)       Balance Overall balance assessment: Needs assistance Sitting-balance support: No upper extremity supported, Feet supported Sitting balance-Leahy Scale: Fair     Standing balance support: Reliant on assistive device for balance Standing balance-Leahy Scale: Poor Standing balance comment: reliant on device                            Cognition Arousal/Alertness: Awake/alert Behavior During Therapy: WFL for tasks assessed/performed Overall Cognitive Status: Within Functional Limits for tasks assessed                                 General Comments: Alert and oriented.  Able to report on PLOF but did change her story when therapist reiterating how she bathed.        Exercises      General Comments        Pertinent Vitals/Pain Pain Assessment Pain Assessment: Faces Faces Pain Scale: Hurts a little bit Pain Location: Rt foot Pain Descriptors / Indicators: Grimacing, Throbbing Pain Intervention(s): Limited activity within patient's tolerance, Monitored during session, Repositioned     Home Living                          Prior Function            PT Goals (current goals can now be found in the care plan section) Acute Rehab PT Goals PT Goal Formulation: With patient Time For Goal Achievement: 06/15/21 Potential to Achieve Goals: Fair Progress towards PT goals: Progressing toward goals    Frequency    Min 2X/week      PT Plan Current plan remains appropriate    Co-evaluation              AM-PAC PT "6 Clicks" Mobility   Outcome Measure  Help needed turning from your back to your side while in a flat bed without using bedrails?: Total Help needed moving from lying on your back to sitting on the side of a flat bed without using bedrails?: Total Help needed moving to and from a bed to a chair (including a wheelchair)?: Total Help needed standing up from a chair using your arms (e.g., wheelchair or bedside chair)?: A Lot Help needed to walk in hospital room?: A Lot Help needed climbing 3-5 steps with a railing? : Total 6 Click Score: 8    End of Session Equipment Utilized During Treatment: Gait belt Activity Tolerance: Patient tolerated treatment well;No increased pain Patient left: in chair;with call bell/phone within reach;with chair alarm set Nurse Communication: Mobility status;Need for lift equipment PT Visit Diagnosis: Other abnormalities of gait and mobility (R26.89);Muscle weakness (generalized) (M62.81);History of falling (Z91.81);Repeated falls (R29.6);Difficulty in walking, not elsewhere classified (R26.2)     Time: 1040-1107 PT Time Calculation (min) (ACUTE ONLY): 27 min  Charges:  $Therapeutic Activity: 23-37 mins                     Verner Mould, DPT Acute Rehabilitation Services Office [phone] Pager [phone]    Jacques Navy 06/05/2021, 2:40 PM

## 2021-06-05 NOTE — Progress Notes (Signed)
[facility] PROGRESS NOTE  Patient: Makayla [name] LAG:[id number]   PCP: Arthur Holms, NP DOB: April 16, 1965   DOA: 06/01/2021   DOS: 06/05/2021    Subjective: No nausea no vomiting no fever no chills.  No acute complaint.  Reports chronic pain in bilateral toe and hand.  Objective:  Vitals:   06/05/21 0832 06/05/21 1531  BP:  (!) 147/88  Pulse:  (!) 105  Resp:  18  Temp:  98.5 F (36.9 C)  SpO2: 99% 100%    S1-S2 present. Good auscultation. Good pulses bilaterally. No evidence of cellulitis on lower extremities. No focal deficit.  Assessment and plan: Leg pain. Patient days a muscle relaxant at home will resume.  Hyperglycemia. Type 2 diabetes mellitus with uncontrolled with hyperand hypoglycemia Continue sliding scale insulin, add Lantus.  Author: Berle Mull, MD [facility] 06/05/2021 7:43 PM   If 7PM-7AM, please contact night-coverage at [email or web address]

## 2021-06-06 DIAGNOSIS — E1140 Type 2 diabetes mellitus with diabetic neuropathy, unspecified: Secondary | ICD-10-CM | POA: Diagnosis present

## 2021-06-06 LAB — BASIC METABOLIC PANEL
Anion gap: 6 (ref 5–15)
BUN: 11 mg/dL (ref 6–20)
CO2: 22 mmol/L (ref 22–32)
Calcium: 8.4 mg/dL — ABNORMAL LOW (ref 8.9–10.3)
Chloride: 105 mmol/L (ref 98–111)
Creatinine, Ser: 1.08 mg/dL — ABNORMAL HIGH (ref 0.44–1.00)
GFR, Estimated: >60 mL/min (ref >60)
Glucose, Bld: 166 mg/dL — ABNORMAL HIGH (ref 70–99)
Potassium: 4.2 mmol/L (ref 3.5–5.1)
Sodium: 133 mmol/L — ABNORMAL LOW (ref 135–145)

## 2021-06-06 LAB — GLUCOSE, CAPILLARY
Glucose-Capillary: 153 mg/dL — ABNORMAL HIGH (ref 70–99)
Glucose-Capillary: 198 mg/dL — ABNORMAL HIGH (ref 70–99)
Glucose-Capillary: 313 mg/dL — ABNORMAL HIGH (ref 70–99)
Glucose-Capillary: 234 mg/dL — ABNORMAL HIGH (ref 70–99)

## 2021-06-06 MED ORDER — GABAPENTIN 100 MG PO CAPS
200.0000 mg | ORAL_CAPSULE | Freq: Two times a day (BID) | ORAL | Status: DC
  Administered 2021-06-06 – 2021-06-07 (×3): 200 mg via ORAL
  Filled 2021-06-06 (×3): qty 2

## 2021-06-06 MED ORDER — HYDROCORTISONE 1 % EX CREA
TOPICAL_CREAM | Freq: Three times a day (TID) | CUTANEOUS | Status: DC | PRN
  Filled 2021-06-06: qty 28

## 2021-06-06 NOTE — Progress Notes (Signed)
Occupational Therapy Treatment Patient Details Name: Antonetta Clanton MRN: [id number] DOB: 1965-05-06 Today's Date: 06/06/2021   History of present illness 56 year old female with diabetes mellitus, CKD stage IIIb, obesity, neuropathy, hypothyroidism, hypertension, hyperlipidemia.  Pt with recent admissions 03/21/21, 04/12/21, and most recently 05/17/21 for Sepsis with bacteremia and Covid-19 . Pt presented 06/01/21 with multiple falls & hypoglycemia.   OT comments  Patient needing mod A to scoot hips to edge of bed and increased time due to L foot pain. Once edge of bed patient able to power up to standing and min G assist to transfer as she is mildly unsteady with limited standing tolerance. Patient fatigues quickly, declined further ADLs while seated in chair. Acute OT to follow.    Recommendations for follow up therapy are one component of a multi-disciplinary discharge planning process, led by the attending physician.  Recommendations may be updated based on patient status, additional functional criteria and insurance authorization.    Follow Up Recommendations  Skilled nursing-short term rehab (<3 hours/day)    Assistance Recommended at Discharge Frequent or constant Supervision/Assistance  Patient can return home with the following  A lot of help with walking and/or transfers;A lot of help with bathing/dressing/bathroom;Assistance with cooking/housework;Help with stairs or ramp for entrance   Equipment Recommendations  Tub/shower bench       Precautions / Restrictions Precautions Precautions: Fall Precaution Comments: per chart pt's sister stated pt had 5 falls in past 1 week, pt reported 3 falls in past week Restrictions Weight Bearing Restrictions: No       Mobility Bed Mobility Overal bed mobility: Needs Assistance Bed Mobility: Supine to Sit     Supine to sit: Mod assist, HOB elevated     General bed mobility comments: Increased time to mobilize legs to edge of bed. Heavy use  of bed rails and mod A with use of bed pad to scoot hips to edge of bed. Also deflated mattress for firmer surface       Balance Overall balance assessment: Needs assistance Sitting-balance support: No upper extremity supported, Feet supported Sitting balance-Leahy Scale: Fair     Standing balance support: Reliant on assistive device for balance Standing balance-Leahy Scale: Poor                             ADL either performed or assessed with clinical judgement   ADL Overall ADL's : Needs assistance/impaired     Grooming: Set up;Sitting;Wash/dry face                   Toilet Transfer: Min guard;Stand-pivot;Rolling walker (2 wheels) Toilet Transfer Details (indicate cue type and reason): To recliner. Patient did not need assistance to power up to standing. Min G for safety. Cues to maintain hold of walker.         Functional mobility during ADLs: Min guard        Cognition Arousal/Alertness: Awake/alert Behavior During Therapy: WFL for tasks assessed/performed Overall Cognitive Status: Within Functional Limits for tasks assessed                                                     Pertinent Vitals/ Pain       Pain Assessment Pain Assessment: Faces Faces Pain Scale: Hurts little more Pain Location: left foot Pain  Descriptors / Indicators: Grimacing, Throbbing Pain Intervention(s): Monitored during session         Frequency  Min 2X/week        Progress Toward Goals  OT Goals(current goals can now be found in the care plan section)  Progress towards OT goals: Progressing toward goals  Acute Rehab OT Goals Patient Stated Goal: Less pain in foot OT Goal Formulation: With patient Time For Goal Achievement: 06/16/21 Potential to Achieve Goals: Good ADL Goals Pt Will Transfer to Toilet: with supervision;ambulating;regular height toilet;grab bars Pt Will Perform Toileting - Clothing Manipulation and hygiene: with  supervision;sit to/from stand Additional ADL Goal #1: Patient will perform 10 min functional activity or exercise activity as evidence of improving activity tolerance Additional ADL Goal #2: Patient will stand at sink to perform grooming task as evidence of improving activity tolerance  Plan Discharge plan remains appropriate       AM-PAC OT "6 Clicks" Daily Activity     Outcome Measure   Help from another person eating meals?: None Help from another person taking care of personal grooming?: A Little Help from another person toileting, which includes using toliet, bedpan, or urinal?: A Lot Help from another person bathing (including washing, rinsing, drying)?: A Lot Help from another person to put on and taking off regular upper body clothing?: A Little Help from another person to put on and taking off regular lower body clothing?: Total 6 Click Score: 15    End of Session Equipment Utilized During Treatment: Rolling walker (2 wheels)  OT Visit Diagnosis: Muscle weakness (generalized) (M62.81);Other abnormalities of gait and mobility (R26.89)   Activity Tolerance Patient tolerated treatment well   Patient Left in chair;with call bell/phone within reach   Nurse Communication Mobility status        Time: 1610-9604 OT Time Calculation (min): 17 min  Charges: OT General Charges $OT Visit: 1 Visit OT Treatments $Self Care/Home Management : 8-22 mins  Delbert Phenix OT OT pager: [phone]   Rosemary Holms 06/06/2021, 11:49 AM

## 2021-06-06 NOTE — Assessment & Plan Note (Signed)
Initiate gabapentin.

## 2021-06-06 NOTE — Progress Notes (Signed)
Progress Note   Patient: Makayla [name] CZY:[id number] DOB: November 19, 1965 DOA: 06/01/2021     Hospitalization day: 4 DOS: the patient was seen and examined on 06/06/2021   Brief hospital course: Serene Kopf is a 56 y.o. female with medical history significant of type 2 diabetes mellitus, CKD 3B, chronic neuropathy, HTN, HLD, morbid obesity.  Patient presented with complaints of confusion and low sugar as well as recurrent falls. Found to have hypoglycemia. 2/23 started on D5 LR. 2/24 started on D10 due to ongoing hypoglycemic episodes. 2/25 D10 discontinued.  Started on sensitive sliding scale. 2/26 started on 8 units of Lantus. 2/28 medically stable for transfer to SNF when a bed is available.  Assessment and Plan: * Hypoglycemia- (present on admission) Due to excessive insulin use with poor p.o. 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 Patient presents with complaints of recurrent falls. CBG was significantly low. Despite providing oral glucose CBG remains low and therefore patient was referred for admission. Hemoglobin A1c 6.8. Blood sugar has improved.  Now D10 have been discontinued. Continue sliding scale insulin.  Will initiate low-dose Levemir.   Unwitnessed fall- (present on admission) Ataxia Horizontal nystagmus Patient reports 5 falls.  Most likely the falls are secondary to hypoglycemic episodes. Last night's fall was reported as she went to sleep when she stood up! Currently does not have any focal deficit but have bilateral tremors as well as abnormal past-pointing and horizontal nystagmus. CT head as well as MRI brain negative for any acute stroke.  PT recommends SNF.  Ataxia, horizontal nystagmus PT OT consulted.  Recommend SNF.  Diabetic neuropathy (HCC) Initiate gabapentin.  Pressure injury of skin- (present on admission) Stage II left buttock. Continue foam dressing.  Hypothyroidism-  (present on admission) Continue Synthroid.  TSH normal.  Free T4 mildly elevated.  Monitor.  Recommend recheck in 1 month.  Stage 3b chronic kidney disease (CKD) ([place])- (present on admission) Baseline serum creatinine 1.3.  Currently serum creatinine 1.0. GFR more than 30. Monitor avoid nephrotoxic medication.  Obesity, morbid ([place])- (present on admission) BMI more than 40. Placing the patient at high risk of poor outcome. On [facility] outpatient.  Currently on hold.  Hyperlipidemia associated with type 2 diabetes mellitus ([place])- (present on admission) Continue statin.  Subjective: No nausea no vomiting no fever no chills.  Reports right leg pain and left hand pain.  Pain reported as tingling numbness and pins-and-needles.   Physical Exam: Vitals:   06/05/21 2040 06/06/21 0615 06/06/21 0818 06/06/21 1323  BP: (!) 147/88 130/80  (!) 159/83  Pulse: 96 94  94  Resp: 20 20  18   Temp: 98.2 F (36.8 C) 98.3 F (36.8 C)  98.3 F (36.8 C)  TempSrc: Oral Oral  Oral  SpO2: 100% 99% 99% 100%   General: Appear in mild distress; no visible Abnormal Neck Mass Or lumps, Conjunctiva normal Cardiovascular: S1 and S2 Present, no Murmur, Respiratory: good respiratory effort, Bilateral Air entry present and CTA, no Crackles, no wheezes Abdomen: Bowel Sound present Extremities: no Pedal edema, peripheral pulses present Neurology: alert and oriented to time, place, and person Gait not checked due to patient safety concerns   Data Reviewed:  I have Reviewed nursing notes, Vitals, and Lab results since pt's last encounter. Pertinent lab results BMP I have ordered test including BMP    Family Communication: None at bedside  Disposition: Status is: Inpatient Remains inpatient appropriate because: Ongoing hypoglycemia/hyperglycemia requiring further treatment.  Unsafe discharge to home.  Currently awaiting SNF placement.  Author: Berle Mull, MD 06/06/2021 8:36 PM  For on call review  [email or web address].

## 2021-06-06 NOTE — Plan of Care (Signed)
  Problem: Clinical Measurements: Goal: Ability to maintain clinical measurements within normal limits will improve Outcome: Progressing Goal: Diagnostic test results will improve Outcome: Progressing   

## 2021-06-06 NOTE — TOC Progression Note (Signed)
Transition of Care [facility]) - Progression Note    Patient Details  Name: Nohealani Medinger MRN: [id number] Date of Birth: 1966-02-09  [facility]) CM/SW Contact  Purcell Mouton, RN Phone Number: 06/06/2021, 2:14 PM  Clinical Narrative:    Spoke with pt's sister [name] who accepted [facility] in [place] for SNF. Will start insurance authorization.    Expected Discharge Plan: Skilled Nursing Facility Barriers to Discharge: Continued Medical Work up, Ship broker  Expected Discharge Plan and Services Expected Discharge Plan: [place]                                               Social Determinants of Health (SDOH) Interventions    Readmission Risk Interventions No flowsheet data found.

## 2021-06-07 LAB — BASIC METABOLIC PANEL
Anion gap: 7 (ref 5–15)
BUN: 11 mg/dL (ref 6–20)
CO2: 20 mmol/L — ABNORMAL LOW (ref 22–32)
Calcium: 8.1 mg/dL — ABNORMAL LOW (ref 8.9–10.3)
Chloride: 105 mmol/L (ref 98–111)
Creatinine, Ser: 0.97 mg/dL (ref 0.44–1.00)
GFR, Estimated: >60 mL/min (ref >60)
Glucose, Bld: 218 mg/dL — ABNORMAL HIGH (ref 70–99)
Potassium: 5 mmol/L (ref 3.5–5.1)
Sodium: 132 mmol/L — ABNORMAL LOW (ref 135–145)

## 2021-06-07 LAB — GLUCOSE, CAPILLARY: Glucose-Capillary: 307 mg/dL — ABNORMAL HIGH (ref 70–99)

## 2021-06-07 MED ORDER — MECLIZINE HCL 25 MG PO TABS
25.0000 mg | ORAL_TABLET | Freq: Three times a day (TID) | ORAL | 0 refills | Status: DC | PRN
Start: 2021-06-07 — End: 2023-01-30

## 2021-06-07 MED ORDER — GLIPIZIDE 5 MG PO TABS: 2.5000 mg | ORAL_TABLET | Freq: Every day | ORAL | Status: AC

## 2021-06-07 MED ORDER — PREDNISONE 10 MG (21) PO TBPK: ORAL_TABLET | ORAL | 0 refills | Status: AC

## 2021-06-07 MED ORDER — CHLORZOXAZONE 500 MG PO TABS: 250.0000 mg | ORAL_TABLET | Freq: Three times a day (TID) | ORAL | 0 refills | Status: AC | PRN

## 2021-06-07 MED ORDER — INSULIN GLARGINE-YFGN 100 UNIT/ML ~~LOC~~ SOLN: 8.0000 [IU] | Freq: Every day | SUBCUTANEOUS | 11 refills | Status: AC

## 2021-06-07 MED ORDER — GABAPENTIN 100 MG PO CAPS: 200.0000 mg | ORAL_CAPSULE | Freq: Two times a day (BID) | ORAL | 1 refills | Status: AC

## 2021-06-07 MED ORDER — PREDNISONE 50 MG PO TABS
60.0000 mg | ORAL_TABLET | Freq: Every day | ORAL | Status: AC
  Administered 2021-06-07: 60 mg via ORAL
  Filled 2021-06-07: qty 1

## 2021-06-07 NOTE — Discharge Summary (Signed)
Physician Discharge Summary   Patient: Makayla [name] MRN: [id number] DOB: October 19, 1965  Admit date:     06/01/2021  Discharge date: 06/07/21  Discharge Physician: Nita Sells   PCP: Arthur Holms, NP   Recommendations at discharge:   Continue outpatient adjustment of insulins--- have also cut back glipizide to 2.5 twice daily We will need Chem-12, CBC in about 1 week Recheck TSH in about a month Steroid taper as per neurology for concerns of vestibular neuritis and patient will need vestibular rehab--meclizine added at suggestion of neurologist Patient will need outpatient close evaluation with optometrist as some of her vertiginous symptoms /falls might be secondary to dysmetria and poor visual acuity-she has mentioned in the past that she cannot get eyeglasses secondary to the high cost  Discharge Diagnoses: Principal Problem:   Hypoglycemia Active Problems:   Hyperlipidemia associated with type 2 diabetes mellitus ([place])   Obesity, morbid ([place])   Type 2 diabetes mellitus with chronic kidney disease, with long-term current use of insulin ([place])   Stage 3b chronic kidney disease (CKD) ([place])   Hypothyroidism   Pressure injury of skin   Unwitnessed fall   Ataxia, horizontal nystagmus   Diabetic neuropathy ([place])  Resolved Problems:   * No resolved hospital problems. *   Hospital Course: 56 y.o. female with medical history significant of type 2 diabetes mellitus, CKD 3B, chronic neuropathy, HTN, HLD, morbid obesity.  COVID recovered after 2/16 hospitalization  patient presented with complaints of confusion and low sugar as well as recurrent falls--reported 5 such falls?  Vertigo?  BPPV Found to have hypoglycemia--treated appropriately with D5, D10 Insulins adjusted during hospital stay  Assessment and Plan:   Unwitnessed fall- (present on admission) Ataxia Horizontal nystagmus Patient reports 5 falls--- she could have BPPV versus vestibular neuritis CT head as well as MRI  brain negative for any acute stroke.  If able we will ask PT to see for vestibular rehab in hospital otherwise will follow recommendations of Dr. Lorrin Goodell who made recommendations regarding steroid dosing and meclizine to be given for dizziness Patient will need glasses as she states that prismatic lenses have helped her in the past  PT recommends SNF.  * Hypoglycemia- (present on admission) Due to excessive insulin use with poor p.o. 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 Patient presents with complaints of recurrent falls. Hemoglobin A1c 6.8.  Stopped sliding scale on discharge will go back to lower dose of oral medication Patient will be going home on Semglee as she cannot afford the other long-acting she was on a low-dose of 8 units Blood sugar has improved.  Now D10 have been discontinued. Continue sliding scale insulin.  Will initiate low-dose Levemir.   Diabetic neuropathy ([place])- (present on admission) Initiated gabapentin low-dose this hospital stay Care with sedation  Ataxia, horizontal and vertical nystagmus PT OT consulted--- will probably need outpatient vestibular rehab  Pressure injury of skin- (present on admission) Stage II left buttock. Continue foam dressing and frequent turning at facility  Hypothyroidism- (present on admission) Continue Synthroid.  TSH normal.  Free T4 mildly elevated.  Monitor.  Recommend recheck in 1 month.  Stage 3b chronic kidney disease (CKD) ([place])- (present on admission) Baseline serum creatinine 1.3.  Currently serum creatinine 1.0. GFR more than 30. Monitor avoid nephrotoxic medication.  Obesity, morbid ([place])- (present on admission) BMI more than 40. Placing the patient at high risk of poor outcome. On [facility] outpatient which will be reconciled to  a much lower dose of 8 units  Hyperlipidemia associated with type 2 diabetes mellitus ([place])- (present on  admission) Continue statin.          Consultants: neuro Procedures performed: ct mri  Disposition: Skilled nursing facility Diet recommendation:  Cardiac and Carb modified diet  DISCHARGE MEDICATION: Allergies as of 06/07/2021       Reactions   Penicillins Shortness Of Breath   Inflates bronchitis Tolerates Keflex, Rocephin   Ibuprofen Other (See Comments)   Can not take due to kidney problems.    Peach Flavor Swelling   Peaches.    Strawberry Extract Swelling        Medication List     STOP taking these medications    amLODipine 5 MG tablet Commonly known as: NORVASC   cyclobenzaprine 5 MG tablet Commonly known as: FLEXERIL   furosemide 20 MG tablet Commonly known as: LASIX   Mounjaro 2.5 MG/0.5ML Pen Generic drug: tirzepatide   [place] FlexTouch 200 UNIT/ML FlexTouch Pen Generic drug: insulin degludec       TAKE these medications    acetaminophen 325 MG tablet Commonly known as: TYLENOL Take 650 mg by mouth every 6 (six) hours as needed for moderate pain.   albuterol 108 (90 Base) MCG/ACT inhaler Commonly known as: VENTOLIN HFA Inhale 2 puffs into the lungs every 6 (six) hours as needed for wheezing or shortness of breath. For shortness of breath   allopurinol 100 MG tablet Commonly known as: ZYLOPRIM Take 100 mg by mouth 2 (two) times daily.   B-D UF III MINI PEN NEEDLES 31G X 5 MM Misc Generic drug: Insulin Pen Needle See admin instructions.   chlorzoxazone 500 MG tablet Commonly known as: PARAFON Take 0.5 tablets (250 mg total) by mouth 3 (three) times daily as needed for muscle spasms. What changed: when to take this   gabapentin 100 MG capsule Commonly known as: NEURONTIN Take 2 capsules (200 mg total) by mouth 2 (two) times daily.   glipiZIDE 5 MG tablet Commonly known as: GLUCOTROL Take 0.5 tablets (2.5 mg total) by mouth daily before breakfast. What changed:  how much to take when to take this   glucose blood test  strip Commonly known as: OneTouch Verio Use as instructed to check blood sugar 2 times daily Dx code E11.9   insulin glargine-yfgn 100 UNIT/ML injection Commonly known as: SEMGLEE Inject 0.08 mLs (8 Units total) into the skin daily.   Insulin Syringe-Needle U-100 29G X 1/2" 0.3 ML Misc Commonly known as: SAFETY-GLIDE 0.3CC SYR 29GX1/2 Use as directed.   levothyroxine 50 MCG tablet Commonly known as: SYNTHROID Take 50 mcg by mouth daily before breakfast.   meclizine 25 MG tablet Commonly known as: ANTIVERT Take 1 tablet (25 mg total) by mouth 3 (three) times daily as needed for dizziness.   multivitamin with minerals Tabs tablet Take 1 tablet by mouth daily.   omeprazole 20 MG capsule Commonly known as: PRILOSEC Take 20 mg by mouth daily before breakfast.   predniSONE 10 MG (21) Tbpk tablet Commonly known as: STERAPRED UNI-PAK 21 TAB Take 6 tablets (60 mg total) by mouth daily for 5 days, THEN 4 tablets (40 mg total) daily for 2 days, THEN 2 tablets (20 mg total) daily for 2 days, THEN 1 tablet (10 mg total) daily for 2 days. Start taking on: June 07, 2021   psyllium 58.6 % packet Commonly known as: METAMUCIL Take 1 packet by mouth daily as needed (diarrhea).   rosuvastatin 10  MG tablet Commonly known as: CRESTOR Take 10 mg by mouth at bedtime.   Symbicort 160-4.5 MCG/ACT inhaler Generic drug: budesonide-formoterol Inhale 2 puffs into the lungs 2 (two) times daily as needed (sob/wheezing).         Discharge Exam: There were no vitals filed for this visit. Awake coherent no distress Significant nystagmus noted [place] corrected quickly in opposite direction Chest clear no added sound no rales no rhonchi Abdomen obese nontender no rebound no guarding No lower extremity edema ROM intact Gait not tested  Condition at discharge: fair  The results of significant diagnostics from this hospitalization (including imaging, microbiology, ancillary and laboratory)  are listed below for reference.   Imaging Studies: CT ABDOMEN PELVIS WO CONTRAST  Result Date: 05/17/2021 CLINICAL DATA:  Epigastric and right upper abdominal pain EXAM: CT ABDOMEN AND PELVIS WITHOUT CONTRAST TECHNIQUE: Multidetector CT imaging of the abdomen and pelvis was performed following the standard protocol without IV contrast. RADIATION DOSE REDUCTION: This exam was performed according to the departmental dose-optimization program which includes automated exposure control, adjustment of the mA and/or kV according to patient size and/or use of iterative reconstruction technique. COMPARISON:  05/17/2021, 03/21/2021 FINDINGS: Lower chest: Scattered hypoventilatory changes at the lung bases. No acute pleural or parenchymal lung disease. Hepatobiliary: Calcified gallstones layer dependently in the gallbladder. No evidence of acute cholecystitis. Unremarkable unenhanced appearance of the liver. Pancreas: Unremarkable unenhanced appearance. Spleen: Unremarkable unenhanced appearance. Adrenals/Urinary Tract: No urinary tract calculi or obstructive uropathy within either kidney. Adrenals are unremarkable. Bladder is decompressed, limiting its evaluation. Stomach/Bowel: No bowel obstruction or ileus. No bowel wall thickening or inflammatory change. Vascular/Lymphatic: No significant vascular findings are present. No enlarged abdominal or pelvic lymph nodes. Reproductive: Fat containing left ovarian mass measuring up to 13.3 x 9.2 cm, consistent with dermoid. Right ovary is unremarkable. Stable uterine calcifications consistent with fibroids. Other: No free fluid or free gas.  No abdominal wall hernia. Musculoskeletal: No acute or destructive bony lesions. Reconstructed images demonstrate no additional findings. IMPRESSION: 1. Cholelithiasis without cholecystitis. 2. 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 3. Fibroid uterus. Electronically Signed   By: Randa Ngo M.D.    On: 05/17/2021 22:51   CT HEAD WO CONTRAST (5MM)  Result Date: 06/01/2021 CLINICAL DATA:  Head trauma, altered mental status EXAM: CT HEAD WITHOUT CONTRAST TECHNIQUE: Contiguous axial images were obtained from the base of the skull through the vertex without intravenous contrast. RADIATION DOSE REDUCTION: This exam was performed according to the departmental dose-optimization program which includes automated exposure control, adjustment of the mA and/or kV according to patient size and/or use of iterative reconstruction technique. COMPARISON:  CT head and brain MRI 04/12/2021 FINDINGS: Brain: There is no evidence of acute intracranial hemorrhage, extra-axial fluid collection, or acute infarct. There is a background of mild global parenchymal volume loss with prominence of the ventricular system and extra-axial CSF spaces, slightly advanced for age. Encephalomalacia in the bilateral anterior temporal lobes with mild associated ex vacuo dilatation of the temporal horns is unchanged. The ventricles are stable in size. Gray-white differentiation is preserved. There is no mass lesion.  There is no midline shift. Vascular: No hyperdense vessel or unexpected calcification. Skull: Normal. Negative for fracture or focal lesion. Sinuses/Orbits: The paranasal sinuses are clear. The globes and orbits are unremarkable. Other: The mastoid air cells are clear. IMPRESSION: 1. No acute intracranial pathology. 2. Unchanged encephalomalacia in the anterior temporal lobes, which may reflect sequela of prior  trauma. Electronically Signed   By: Valetta Mole M.D.   On: 06/01/2021 13:22   MR BRAIN WO CONTRAST  Result Date: 06/01/2021 CLINICAL DATA:  Dizziness, fall EXAM: MRI HEAD WITHOUT CONTRAST TECHNIQUE: Multiplanar, multiecho pulse sequences of the brain and surrounding structures were obtained without intravenous contrast. COMPARISON:  04/12/2021 MRI head, correlation is also made with 06/01/2021 CT head FINDINGS: Evaluation  is somewhat limited by motion artifact. Brain: No restricted diffusion to suggest acute or subacute infarcts. No acute hemorrhage, mass, mass effect, or midline shift. Redemonstrated encephalomalacia in the left-greater-than-right anterior temporal lobes. Advanced cerebral atrophy for age, unchanged compared to January 2023. No hydrocephalus or extra-axial collection. Vascular: Normal flow voids, with redemonstrated hypoplastic right vertebral artery. Skull and upper cervical spine: Again noted is diffusely decreased marrow signal, which is nonspecific; although this can be caused by an infiltrative marrow process, the most common causes include anemia, obesity, or tobacco use. No focal suspicious osseous lesion. Sinuses/Orbits: Negative. Other: The mastoids are well aerated. IMPRESSION: No acute intracranial process. No etiology is seen for the patient's dizziness. Electronically Signed   By: Merilyn Baba M.D.   On: 06/01/2021 20:06   US Abdomen Complete  Result Date: 05/17/2021 CLINICAL DATA:  Right upper quadrant pain EXAM: ABDOMEN ULTRASOUND COMPLETE COMPARISON:  CT 03/21/2021 FINDINGS: Gallbladder: Multiple small shadowing stones and gallbladder sludge. Normal wall thickness. Negative sonographic Murphy. Common bile duct: Diameter: 6 mm Liver: Diffusely echogenic. No focal hepatic abnormality. Portal vein is patent on color Doppler imaging with normal direction of blood flow towards the liver. IVC: No abnormality visualized. Pancreas: Visualized portion unremarkable. Spleen: Size and appearance within normal limits. Right Kidney: Length: 9.8 cm. Echogenicity within normal limits. Mild upper pole hydronephrosis but no pelvic dilatation. Left Kidney: Length: 9.8 cm. Echogenicity within normal limits. No mass or hydronephrosis visualized. Abdominal aorta: No aneurysm visualized. Other findings: None. IMPRESSION: 1. Cholelithiasis without sonographic evidence for acute cholecystitis. 2. Echogenic liver  consistent with hepatic steatosis 3. Mild upper pole caliectasis on the right without renal pelvis dilatation. Electronically Signed   By: Donavan Foil M.D.   On: 05/17/2021 19:31   DG Chest Portable 1 View  Result Date: 06/01/2021 CLINICAL DATA:  Hypoglycemia, fall EXAM: PORTABLE CHEST 1 VIEW COMPARISON:  05/17/2021 FINDINGS: Limited exam secondary to poor penetration related to patient body habitus. The heart size and mediastinal contours are within normal limits. No focal airspace consolidation, pleural effusion, or pneumothorax. The visualized skeletal structures are unremarkable. IMPRESSION: No active disease. Electronically Signed   By: Davina Poke D.O.   On: 06/01/2021 11:53   DG Chest Portable 1 View  Result Date: 05/17/2021 CLINICAL DATA:  Abdominal pain.  History of ovarian cysts. EXAM: PORTABLE CHEST 1 VIEW COMPARISON:  03/21/2021 FINDINGS: Shallow inspiration. Heart size and pulmonary vascularity are normal. Increased density of the left chest likely is due to overlying soft tissue attenuation and patient rotation. No definite consolidation or effusion. No pneumothorax. IMPRESSION: Shallow inspiration.  No evidence of active pulmonary disease. Electronically Signed   By: Lucienne Capers M.D.   On: 05/17/2021 19:42    Microbiology: Results for orders placed or performed during the hospital encounter of 05/17/21  Resp Panel by RT-PCR (Flu A&B, Covid) Nasopharyngeal Swab     Status: Abnormal   Collection Time: 05/17/21  4:50 PM   Specimen: Nasopharyngeal Swab; Nasopharyngeal(NP) swabs in vial transport medium  Result Value Ref Range Status   SARS Coronavirus 2 by RT PCR POSITIVE (A) NEGATIVE Final  Comment: (NOTE) SARS-CoV-2 target nucleic acids are DETECTED.  The SARS-CoV-2 RNA is generally detectable in upper respiratory specimens during the acute phase of infection. Positive results are indicative of the presence of the identified virus, but do not rule out bacterial  infection or co-infection with other pathogens not detected by the test. Clinical correlation with patient history and other diagnostic information is necessary to determine patient infection status. The expected result is Negative.  Fact Sheet for Patients: EntrepreneurPulse.com.au  Fact Sheet for Healthcare Providers: IncredibleEmployment.be  This test is not yet approved or cleared by the [place] FDA and  has been authorized for detection and/or diagnosis of SARS-CoV-2 by FDA under an Emergency Use Authorization (EUA).  This EUA will remain in effect (meaning this test can be used) for the duration of  the COVID-19 declaration under Section 564(b)(1) of the A ct, 21 U.S.C. section 360bbb-3(b)(1), unless the authorization is terminated or revoked sooner.     Influenza A by PCR NEGATIVE NEGATIVE Final   Influenza B by PCR NEGATIVE NEGATIVE Final    Comment: (NOTE) The Xpert Xpress SARS-CoV-2/FLU/RSV plus assay is intended as an aid in the diagnosis of influenza from Nasopharyngeal swab specimens and should not be used as a sole basis for treatment. Nasal washings and aspirates are unacceptable for Xpert Xpress SARS-CoV-2/FLU/RSV testing.  Fact Sheet for Patients: EntrepreneurPulse.com.au  Fact Sheet for Healthcare Providers: IncredibleEmployment.be  This test is not yet approved or cleared by the [place] FDA and has been authorized for detection and/or diagnosis of SARS-CoV-2 by FDA under an Emergency Use Authorization (EUA). This EUA will remain in effect (meaning this test can be used) for the duration of the COVID-19 declaration under Section 564(b)(1) of the Act, 21 U.S.C. section 360bbb-3(b)(1), unless the authorization is terminated or revoked.  Performed at [facility], [place] [address], [place], [place] [address]   Blood culture (routine x 2)     Status: Abnormal    Collection Time: 05/17/21  4:50 PM   Specimen: BLOOD  Result Value Ref Range Status   Specimen Description   Final    BLOOD LEFT ANTECUBITAL Performed at [facility] [address] [place], [place] [address]    Special Requests   Final    BOTTLES DRAWN AEROBIC AND ANAEROBIC Blood Culture results may not be optimal due to an inadequate volume of blood received in culture bottles Performed at [facility] [address], [place], [place] [address]    Culture  Setup Time   Final    GRAM NEGATIVE RODS ANAEROBIC BOTTLE ONLY CRITICAL VALUE NOTED.  VALUE IS CONSISTENT WITH PREVIOUSLY REPORTED AND CALLED VALUE.    Culture (A)  Final    BACTEROIDES FRAGILIS BETA LACTAMASE POSITIVE Performed at [facility], [place] [address] [place], [place] [address]    Report Status 05/21/2021 FINAL  Final  Blood culture (routine x 2)     Status: Abnormal   Collection Time: 05/17/21  4:50 PM   Specimen: BLOOD  Result Value Ref Range Status   Specimen Description   Final    BLOOD RIGHT ANTECUBITAL Performed at [facility] [address], [place], [place] [address]    Special Requests   Final    BOTTLES DRAWN AEROBIC AND ANAEROBIC Blood Culture results may not be optimal due to an inadequate volume of blood received in culture bottles Performed at [facility] [address] [place], [place] [address]  Culture  Setup Time   Final    GRAM NEGATIVE RODS ANAEROBIC BOTTLE ONLY CRITICAL RESULT CALLED TO, READ BACK BY AND VERIFIED WITH: PHARMD MICHELLE BELL 05/19/21@6 :21 BY TW    Culture (A)  Final    BACTEROIDES FRAGILIS BETA LACTAMASE POSITIVE Performed at [facility], [place] [address] [place], [place] [address]    Report Status 05/21/2021 FINAL  Final  Blood Culture ID Panel (Reflexed)     Status: Abnormal   Collection Time: 05/17/21  4:50 PM  Result Value Ref Range Status   Enterococcus faecalis NOT  DETECTED NOT DETECTED Final   Enterococcus Faecium NOT DETECTED NOT DETECTED Final   Listeria monocytogenes NOT DETECTED NOT DETECTED Final   Staphylococcus species NOT DETECTED NOT DETECTED Final   Staphylococcus aureus (BCID) NOT DETECTED NOT DETECTED Final   Staphylococcus epidermidis NOT DETECTED NOT DETECTED Final   Staphylococcus lugdunensis NOT DETECTED NOT DETECTED Final   Streptococcus species NOT DETECTED NOT DETECTED Final   Streptococcus agalactiae NOT DETECTED NOT DETECTED Final   Streptococcus pneumoniae NOT DETECTED NOT DETECTED Final   Streptococcus pyogenes NOT DETECTED NOT DETECTED Final   A.calcoaceticus-baumannii NOT DETECTED NOT DETECTED Final   Bacteroides fragilis DETECTED (A) NOT DETECTED Final    Comment: CRITICAL RESULT CALLED TO, READ BACK BY AND VERIFIED WITH: PHARMD MICHELLE BELL 05/19/21@6 :21 BY TW    Enterobacterales NOT DETECTED NOT DETECTED Final   Enterobacter cloacae complex NOT DETECTED NOT DETECTED Final   Escherichia coli NOT DETECTED NOT DETECTED Final   Klebsiella aerogenes NOT DETECTED NOT DETECTED Final   Klebsiella oxytoca NOT DETECTED NOT DETECTED Final   Klebsiella pneumoniae NOT DETECTED NOT DETECTED Final   Proteus species NOT DETECTED NOT DETECTED Final   Salmonella species NOT DETECTED NOT DETECTED Final   Serratia marcescens NOT DETECTED NOT DETECTED Final   Haemophilus influenzae NOT DETECTED NOT DETECTED Final   Neisseria meningitidis NOT DETECTED NOT DETECTED Final   Pseudomonas aeruginosa NOT DETECTED NOT DETECTED Final   Stenotrophomonas maltophilia NOT DETECTED NOT DETECTED Final   Candida albicans NOT DETECTED NOT DETECTED Final   Candida auris NOT DETECTED NOT DETECTED Final   Candida glabrata NOT DETECTED NOT DETECTED Final   Candida krusei NOT DETECTED NOT DETECTED Final   Candida parapsilosis NOT DETECTED NOT DETECTED Final   Candida tropicalis NOT DETECTED NOT DETECTED Final   Cryptococcus neoformans/gattii NOT DETECTED  NOT DETECTED Final    Comment: Performed at [facility], [address] [place], [place] [address]    Labs: CBC: Recent Labs  Lab 06/01/21 1054 06/02/21 0403  WBC 6.6 5.9  NEUTROABS 3.6  --   HGB 11.1* 9.6*  HCT 35.1* 31.2*  MCV 85.8 88.1  PLT 221 250   Basic Metabolic Panel: Recent Labs  Lab 06/01/21 1054 06/02/21 0403 06/05/21 0343 06/06/21 0350 06/07/21 0354  NA 132* 133* 131* 133* 132*  K 4.3 4.5 4.3 4.2 5.0  CL 102 105 104 105 105  CO2 23 22 23 22  20*  GLUCOSE 65* 96 151* 166* 218*  BUN 10 10 11 11 11   CREATININE 1.10* 1.07* 1.06* 1.08* 0.97  CALCIUM 8.5* 8.2* 8.1* 8.4* 8.1*   Liver Function Tests: Recent Labs  Lab 06/01/21 1054 06/02/21 0403  AST 50* 36  ALT 29 23  ALKPHOS 107 85  BILITOT 0.6 0.4  PROT 7.3 5.9*  ALBUMIN 2.8* 2.4*   CBG: Recent Labs  Lab 06/05/21 2044 06/06/21 5397 06/06/21 1215 06/06/21 1713 06/06/21 2044  GLUCAP 218* 153* 198* 313* 234*    Discharge time spent: greater than 30 minutes.  Signed: Nita Sells, MD [facility] 06/07/2021

## 2021-06-07 NOTE — TOC Progression Note (Addendum)
Transition of Care (TOC) - Progression Note  ? ? ?Patient Details  ?Name: Makayla [name] ?MRN: [id number] ?Date of Birth: 1965-12-10 ? ?Transition of Care (TOC) CM/SW Contact  ?Purcell Mouton, RN ?Phone Number: ?06/07/2021, 9:56 AM ? ?Clinical Narrative:    ?Pt has a bed at [facility], insurance approved, however SNF is asking that medication Darcel Bayley be changed related to the cost being $1,000.00. Not covered by insurance.  ? ? ?Expected Discharge Plan: [place] ?Barriers to Discharge: Continued Medical Work up, Ship broker ? ?Expected Discharge Plan and Services ?Expected Discharge Plan: [place] ?  ?  ?  ?  ?                ?  ?  ?  ?  ?  ?  ?  ?  ?  ?  ? ? ?Social Determinants of Health (SDOH) Interventions ?  ? ?Readmission Risk Interventions ?No flowsheet data found. ? ?

## 2021-06-07 NOTE — TOC Progression Note (Signed)
Transition of Care (TOC) - Progression Note  ? ? ?Patient Details  ?Name: Yona Kosek ?MRN: [id number] ?Date of Birth: 1965-10-20 ? ?Transition of Care (TOC) CM/SW Contact  ?Purcell Mouton, RN ?Phone Number: ?06/07/2021, 2:00 PM ? ?Clinical Narrative:    ?[facility] was called to transport pt. [name] was called to make aware. RN is aware. ? ? ?Expected Discharge Plan: [place] ?Barriers to Discharge: Continued Medical Work up, Ship broker ? ?Expected Discharge Plan and Services ?Expected Discharge Plan: [place] ?  ?  ?  ?  ?                ?  ?  ?  ?  ?  ?  ?  ?  ?  ?  ? ? ?Social Determinants of Health (SDOH) Interventions ?  ? ?Readmission Risk Interventions ?No flowsheet data found. ? ?

## 2021-06-07 NOTE — Progress Notes (Signed)
Brief Neuro update: ? ?Spoke with Dr. Verlon Au about persistent nystagmus and patient with 5-6 falls. MRI Brain is negative for any acute stroke, no obvious T2/FLAIR hyperintensity in BL cerebellum or brainstem. Concern that this is potential vestibular neuritis. ? ?Recs: ?- 60mg  Prednisone x 5 days, then 50 on day 6, 40 on day 7, 30 on day 8, 20 on day 9, 10 on day 10 and then stop. ?- Meclizine 25 PRN Q6H. ?- Outpatient vestibular rehab. ? ?Makayla [name] ?[facility] ?Pager Number [phone] ?

## 2021-06-07 NOTE — Plan of Care (Signed)
  Problem: Clinical Measurements: Goal: Ability to maintain clinical measurements within normal limits will improve Outcome: Progressing Goal: Diagnostic test results will improve Outcome: Progressing   

## 2021-06-07 NOTE — Progress Notes (Signed)
Physical Therapy Treatment ?Patient Details ?Name: Makayla [name] ?MRN: [id number] ?DOB: Jan 19, 1966 ?Today's Date: 06/07/2021 ? ? ?History of Present Illness 56 year old female with diabetes mellitus, CKD stage IIIb, obesity, neuropathy, hypothyroidism, hypertension, hyperlipidemia.  Pt with recent admissions 03/21/21, 04/12/21, and most recently 05/17/21 for Sepsis with bacteremia and Covid-19 . Pt presented 06/01/21 with multiple falls & hypoglycemia. ? ?  ?PT Comments  ? ? PT asked to assess vestibular/vertigo.  Noted neurology consulted with concern for vestibular neuritis with plan for Prednisone and Meclizine.  Pt had difficulty providing clear hx of her vertigo symptoms but did report started after she had COVID.  Pt with continuous , resting horizontal nystagmus.  Symptoms not consistent with BPPV.  Provided pt with habituation exercise program.  Pt also with several other factors contributing to fall risk (neuropathy, obesity, weakness)-but will benefit from balance training also.  Pt has d/c orders today and is going to SNF for further therapy. ?   ?Recommendations for follow up therapy are one component of a multi-disciplinary discharge planning process, led by the attending physician.  Recommendations may be updated based on patient status, additional functional criteria and insurance authorization. ? ?Follow Up Recommendations ? Skilled nursing-short term rehab (<3 hours/day) ?  ?  ?Assistance Recommended at Discharge Frequent or constant Supervision/Assistance  ?Patient can return home with the following Assist for transportation;Help with stairs or ramp for entrance;Two people to help with walking and/or transfers;Two people to help with bathing/dressing/bathroom;Assistance with cooking/housework ?  ?Equipment Recommendations ? None recommended by PT  ?  ?Recommendations for Other Services   ? ? ?  ?Precautions / Restrictions Precautions ?Precautions: Fall ?Precaution Comments: per chart pt's sister stated pt had  5 falls in past 1 week, pt reported 3 falls in past week  ?  ? ?Mobility ? Bed Mobility ?  ?  ?  ?  ?  ?  ?  ?  ?  ? ?Transfers ?  ?  ?  ?  ?  ?  ?  ?  ?  ?  ?  ? ?Ambulation/Gait ?  ?  ?  ?  ?  ?  ?  ?  ? ? ?Stairs ?  ?  ?  ?  ?  ? ? ?Wheelchair Mobility ?  ? ?Modified Rankin (Stroke Patients Only) ?  ? ? ?  ?Balance   ?  ?  ?  ?  ?  ?  ?  ?  ?  ?  ?  ?  ?  ?  ?  ?  ?  ?  ?  ? ?  ?Cognition Arousal/Alertness: Awake/alert ?Behavior During Therapy: [facility] for tasks assessed/performed ?Overall Cognitive Status: No family/caregiver present to determine baseline cognitive functioning ?  ?  ?  ?  ?  ?  ?  ?  ?  ?  ?  ?  ?  ?  ?  ?  ?General Comments: Pt having some difficulty providing details of vertigo/falls history and sometimes recollection of events changed ?  ?  ? ?  ?Exercises   ? ?  ?General Comments  Pt assessed for vestibular dysfunction today. ? ?History: Pt with difficulty providing a clear picture of history of vertigo symptoms.  Does report started after she had COVID.  States it is feeling of instability and falling.  Reports occurs when she is up and moving.  Initially reports occurs with head turns and rolling but later reported not. Not able to say how long symptoms last.  ? ?  Neurology assessed pt and concern for vestibular neuritis. Pt has had MRI. ? ?Objective: ?-Pt with Left beating horizontal nystagmus -constant at rest; does not report dizzy ?-With L side gaze L beating nystagmus, with R side gaze R beating nystagmus ?-EOEM/smooth pursuit: intact with nystagmus ?-Saccades: intact with some overshooting ?-Gaze stabilization: Pt with difficulty stabilizing eyes on target 50% of the time; likely more due to difficulty with following commands than dysfunction ?-Did not test [facility] or Horizontal test as symptoms/nystagmus not consistent and pt with difficulty mobilizing.  ? ?-Provided with habituation exercises: head turns, gaze stabilization, smooth pursuit ? ?  ?  ? ?Pertinent Vitals/Pain Pain  Assessment ?Pain Assessment: No/denies pain  ? ? ?Home Living   ?  ?  ?  ?  ?  ?  ?  ?  ?  ?   ?  ?Prior Function    ?  ?  ?   ? ?PT Goals (current goals can now be found in the care plan section) Progress towards PT goals: Progressing toward goals ? ?  ?Frequency ? ? ? Min 2X/week ? ? ? ?  ?PT Plan Current plan remains appropriate  ? ? ?Co-evaluation   ?  ?  ?  ?  ? ?  ?AM-PAC PT "6 Clicks" Mobility   ?Outcome Measure ? Help needed turning from your back to your side while in a flat bed without using bedrails?: Total ?Help needed moving from lying on your back to sitting on the side of a flat bed without using bedrails?: Total ?Help needed moving to and from a bed to a chair (including a wheelchair)?: Total ?Help needed standing up from a chair using your arms (e.g., wheelchair or bedside chair)?: Total ?Help needed to walk in hospital room?: A Lot ?Help needed climbing 3-5 steps with a railing? : Total ?6 Click Score: 7 ? ?  ?End of Session Equipment Utilized During Treatment: Gait belt ?Activity Tolerance: Patient tolerated treatment well ?Patient left: in bed;with call bell/phone within reach;with bed alarm set ?Nurse Communication: Mobility status ?PT Visit Diagnosis: Other abnormalities of gait and mobility (R26.89);Muscle weakness (generalized) (M62.81);History of falling (Z91.81);Repeated falls (R29.6);Difficulty in walking, not elsewhere classified (R26.2) ?  ? ? ?Time: 1240-1300 ?PT Time Calculation (min) (ACUTE ONLY): 20 min ? ?Charges:  $Neuromuscular Re-education: 8-22 mins          ?          ? ?Abran Richard, PT ?Acute Rehab Services ?Pager [phone] ?[facility] [phone] ? ? ? ?Makayla [name] ?06/07/2021, 1:35 PM ? ?

## 2021-06-07 NOTE — Progress Notes (Signed)
Report called to [place] at [place], patient awaiting transport from [place]. ?

## 2021-06-15 ENCOUNTER — Other Ambulatory Visit: Payer: Medicare Other | Admitting: Obstetrics & Gynecology

## 2021-06-15 ENCOUNTER — Other Ambulatory Visit: Payer: Medicare Other

## 2021-06-23 ENCOUNTER — Ambulatory Visit: Payer: Medicare Other | Admitting: Physician Assistant

## 2022-03-19 ENCOUNTER — Other Ambulatory Visit: Payer: Self-pay | Admitting: Internal Medicine

## 2022-03-19 DIAGNOSIS — N1832 Chronic kidney disease, stage 3b: Secondary | ICD-10-CM

## 2022-07-02 ENCOUNTER — Other Ambulatory Visit: Payer: Self-pay | Admitting: Emergency Medicine

## 2022-07-02 DIAGNOSIS — Z0000 Encounter for general adult medical examination without abnormal findings: Secondary | ICD-10-CM

## 2022-08-15 ENCOUNTER — Ambulatory Visit: Payer: Medicare Other

## 2023-01-18 ENCOUNTER — Ambulatory Visit: Payer: Medicare Other | Admitting: Cardiovascular Disease

## 2023-01-24 ENCOUNTER — Other Ambulatory Visit (HOSPITAL_COMMUNITY): Payer: Self-pay

## 2023-01-25 ENCOUNTER — Encounter (HOSPITAL_COMMUNITY): Payer: Medicare Other

## 2023-01-28 ENCOUNTER — Ambulatory Visit: Payer: Medicare Other | Attending: Cardiovascular Disease | Admitting: Cardiovascular Disease

## 2023-01-28 ENCOUNTER — Encounter: Payer: Self-pay | Admitting: Cardiovascular Disease

## 2023-01-28 VITALS — BP 135/66 | HR 83 | Ht 64.0 in | Wt >= 6400 oz

## 2023-01-28 DIAGNOSIS — M7989 Other specified soft tissue disorders: Secondary | ICD-10-CM

## 2023-01-28 DIAGNOSIS — I5043 Acute on chronic combined systolic (congestive) and diastolic (congestive) heart failure: Secondary | ICD-10-CM

## 2023-01-28 NOTE — Patient Instructions (Signed)
Medication Instructions:  Your physician recommends that you continue on your current medications as directed. Please refer to the Current Medication list given to you today.  *If you need a refill on your cardiac medications before your next appointment, please call your pharmacy*  Lab Work: If you have labs (blood work) drawn today and your tests are completely normal, you will receive your results only by: MyChart Message (if you have MyChart) OR A paper copy in the mail If you have any lab test that is abnormal or we need to change your treatment, we will call you to review the results.  Testing/Procedures: Your physician has requested that you have an echocardiogram. Echocardiography is a painless test that uses sound waves to create images of your heart. It provides your doctor with information about the size and shape of your heart and how well your heart's chambers and valves are working. This procedure takes approximately one hour. There are no restrictions for this procedure. Please do NOT wear cologne, perfume, aftershave, or lotions (deodorant is allowed). Please arrive 15 minutes prior to your appointment time.  Follow-Up: At [facility], you and your health needs are our priority.  As part of our continuing mission to provide you with exceptional heart care, we have created designated Provider Care Teams.  These Care Teams include your primary Cardiologist (physician) and Advanced Practice Providers (APPs -  Physician Assistants and Nurse Practitioners) who all work together to provide you with the care you need, when you need it.  We recommend signing up for the patient portal called "MyChart".  Sign up information is provided on this After Visit Summary.  MyChart is used to connect with patients for Virtual Visits (Telemedicine).  Patients are able to view lab/test results, encounter notes, upcoming appointments, etc.  Non-urgent messages can be sent to your provider as  well.   To learn more about what you can do with MyChart, go to [email or web address].    Your next appointment:   1 year(s)  Provider:   Dr. Odis Hollingshead

## 2023-01-28 NOTE — Progress Notes (Addendum)
Cardiology Office Note:  .   Date:  01/28/2023  ID:  Makayla [name], DOB 12-06-1965, MRN [id number] PCP: Makayla Back, NP  [facility] Providers Cardiologist:  Makayla [name]    History of Present Illness: .   Makayla [name] is a 57 y.o. female with hx of morbid obesity ( wt is 413 lbs) Body mass index is 70.89 kg/m.  Seen with Makayla [name] ( sister )   DM II , CKD, HTN, HLD , morbid obesity with obesity hypoventilation   Cigarette smoking   We are asked to see her for leg edema and possible CHF  She was seen by Makayla So, PA at [facility] in Feb. 2022 for evaluation  Echo from June 16, 2020 showed normal LV systolic and diastolic function  Is mostly wheelchair bound, Can walk short distances with a walker   Has continued leg edema   Still smokes ( perhaps 4 cigarettes a day )   Still eating salty foods   No CP , no worsening of her dyspnea  Diabetes is fairly well controlled.   Was hospitalized in Feb. 2023 for vertigo The vertigo has resolved.            ROS:   Studies Reviewed: Marland Kitchen        EKG Interpretation Date/Time:  Monday January 28 2023 10:36:19 EDT Ventricular Rate:  83 PR Interval:  182 QRS Duration:  80 QT Interval:  396 QTC Calculation: 465 R Axis:   81  Text Interpretation: Normal sinus rhythm Normal ECG When compared with ECG of 01-Jun-2021 14:47, No significant change since last tracing Confirmed by Makayla [name] ([id number]) on 01/28/2023 5:34:20 PM   Risk Assessment/Calculations:             Physical Exam:   VS:  BP 135/66   Pulse 83   Ht 5\' 4"  (1.626 m)   Wt (!) 413 lb (187.3 kg)   LMP 12/08/2012   SpO2 98%   BMI 70.89 kg/m    Wt Readings from Last 3 Encounters:  01/28/23 (!) 413 lb (187.3 kg)  05/25/21 (!) 369 lb 6.4 oz (167.6 kg)  05/01/21 (!) 334 lb (151.5 kg)    GEN: examined on EMS stretcher,  Well nourished, well developed in no acute distress NECK: No JVD; No carotid bruits CARDIAC: RRR, no murmurs, rubs,  gallops RESPIRATORY:  Clear to auscultation without rales, wheezing or rhonchi  ABDOMEN: Soft, non-tender, non-distended EXTREMITIES:  very large legs,  1 + pitting edema ; No deformity   ASSESSMENT AND PLAN: .     Leg edema :   has had issues with leg edema for years.  Is morbidly obese,   continues to smoke, has been diagnosed with obesity hypoventilation ( does not use a CPAP)  Echo in 2022 at [facility] showed normal systolic and diastolic function Will repeat echo   Does not restrict her salt  Advised salt restriction     2.  Morbid obesity :   advised calorie reduction    Will have her follow up with Dr. Odis [name] in 1 year  Cont current meds         Dispo: 1 year    Signed, Makayla Miss, MD

## 2023-01-29 ENCOUNTER — Other Ambulatory Visit: Payer: Self-pay | Admitting: Medical Oncology

## 2023-01-29 DIAGNOSIS — D649 Anemia, unspecified: Secondary | ICD-10-CM

## 2023-01-30 ENCOUNTER — Inpatient Hospital Stay: Payer: Medicare Other | Attending: Internal Medicine | Admitting: Internal Medicine

## 2023-01-30 ENCOUNTER — Other Ambulatory Visit: Payer: Self-pay

## 2023-01-30 ENCOUNTER — Inpatient Hospital Stay: Payer: Medicare Other

## 2023-01-30 ENCOUNTER — Other Ambulatory Visit: Payer: Self-pay | Admitting: Internal Medicine

## 2023-01-30 ENCOUNTER — Other Ambulatory Visit: Payer: Self-pay | Admitting: Medical Oncology

## 2023-01-30 VITALS — BP 157/69 | HR 78 | Temp 98.2°F | Resp 18 | Ht 64.0 in | Wt 365.0 lb

## 2023-01-30 DIAGNOSIS — M109 Gout, unspecified: Secondary | ICD-10-CM | POA: Diagnosis not present

## 2023-01-30 DIAGNOSIS — D649 Anemia, unspecified: Secondary | ICD-10-CM

## 2023-01-30 DIAGNOSIS — N183 Chronic kidney disease, stage 3 unspecified: Secondary | ICD-10-CM | POA: Insufficient documentation

## 2023-01-30 DIAGNOSIS — F17210 Nicotine dependence, cigarettes, uncomplicated: Secondary | ICD-10-CM | POA: Insufficient documentation

## 2023-01-30 DIAGNOSIS — D631 Anemia in chronic kidney disease: Secondary | ICD-10-CM | POA: Insufficient documentation

## 2023-01-30 DIAGNOSIS — N839 Noninflammatory disorder of ovary, fallopian tube and broad ligament, unspecified: Secondary | ICD-10-CM | POA: Diagnosis not present

## 2023-01-30 DIAGNOSIS — D472 Monoclonal gammopathy: Secondary | ICD-10-CM | POA: Insufficient documentation

## 2023-01-30 DIAGNOSIS — Z79899 Other long term (current) drug therapy: Secondary | ICD-10-CM | POA: Diagnosis not present

## 2023-01-30 LAB — CBC WITH DIFFERENTIAL (CANCER CENTER ONLY)
Abs Immature Granulocytes: 0.02 10*3/uL (ref 0.00–0.07)
Basophils Absolute: 0 10*3/uL (ref 0.0–0.1)
Basophils Relative: 1 %
Eosinophils Absolute: 0.3 10*3/uL (ref 0.0–0.5)
Eosinophils Relative: 6 %
HCT: 31.2 % — ABNORMAL LOW (ref 36.0–46.0)
Hemoglobin: 9.8 g/dL — ABNORMAL LOW (ref 12.0–15.0)
Immature Granulocytes: 0 %
Lymphocytes Relative: 36 %
Lymphs Abs: 2.2 10*3/uL (ref 0.7–4.0)
MCH: 27.8 pg (ref 26.0–34.0)
MCHC: 31.4 g/dL (ref 30.0–36.0)
MCV: 88.6 fL (ref 80.0–100.0)
Monocytes Absolute: 0.6 10*3/uL (ref 0.1–1.0)
Monocytes Relative: 10 %
Neutro Abs: 3 10*3/uL (ref 1.7–7.7)
Neutrophils Relative %: 47 %
Platelet Count: 193 10*3/uL (ref 150–400)
RBC: 3.52 MIL/uL — ABNORMAL LOW (ref 3.87–5.11)
RDW: 15.3 % (ref 11.5–15.5)
WBC Count: 6.2 10*3/uL (ref 4.0–10.5)
nRBC: 0 % (ref 0.0–0.2)

## 2023-01-30 LAB — VITAMIN B12: Vitamin B-12: 580 pg/mL (ref 180–914)

## 2023-01-30 LAB — CMP (CANCER CENTER ONLY)
ALT: 11 U/L (ref 0–44)
AST: 21 U/L (ref 15–41)
Albumin: 3.4 g/dL — ABNORMAL LOW (ref 3.5–5.0)
Alkaline Phosphatase: 107 U/L (ref 38–126)
Anion gap: 6 (ref 5–15)
BUN: 56 mg/dL — ABNORMAL HIGH (ref 6–20)
CO2: 25 mmol/L (ref 22–32)
Calcium: 9.2 mg/dL (ref 8.9–10.3)
Chloride: 107 mmol/L (ref 98–111)
Creatinine: 4.91 mg/dL — ABNORMAL HIGH (ref 0.44–1.00)
GFR, Estimated: 10 mL/min — ABNORMAL LOW (ref >60)
Glucose, Bld: 106 mg/dL — ABNORMAL HIGH (ref 70–99)
Potassium: 4.9 mmol/L (ref 3.5–5.1)
Sodium: 138 mmol/L (ref 135–145)
Total Bilirubin: 0.3 mg/dL (ref 0.3–1.2)
Total Protein: 8.3 g/dL — ABNORMAL HIGH (ref 6.5–8.1)

## 2023-01-30 LAB — IRON AND IRON BINDING CAPACITY (CC-WL,HP ONLY)
Iron: 57 ug/dL (ref 28–170)
Saturation Ratios: 24 % (ref 10.4–31.8)
TIBC: 235 ug/dL — ABNORMAL LOW (ref 250–450)
UIBC: 178 ug/dL (ref 148–442)

## 2023-01-30 LAB — LACTATE DEHYDROGENASE: LDH: 214 U/L — ABNORMAL HIGH (ref 98–192)

## 2023-01-30 LAB — FOLATE: Folate: 38.5 ng/mL (ref >5.9)

## 2023-01-30 NOTE — Progress Notes (Signed)
[facility] [phone]   [phone]  CONSULT NOTE  REFERRING PHYSICIAN: Dr. Pascal Lux  REASON FOR CONSULTATION:  57 years old African-American female with monoclonal gammopathy  HPI Makayla [name] is a 57 y.o. female referred to me by her nephrologist for evaluation of monoclonal gammopathy detected on recent blood work for evaluation of her worsening renal function. Discussed the use of AI scribe software for clinical note transcription with the patient, who gave verbal consent to proceed.  History of Present Illness   The patient, a 57 year old [place] American female, presents for evaluation of worsening kidney function and potential multiple myeloma. She reports being under the care of a nephrologist who has been monitoring her serum creatinine levels, which have been progressively increasing over the past several months. The patient is currently residing in a skilled nursing facility due to mobility issues related to gout, which has caused her to lose her balance and rendered her right leg immobile for a period of time.  The patient is also being evaluated for potential gastric surgery for weight loss, but this has been postponed due to concerns about her heart and kidney health. She has successfully lost approximately 100 pounds with the aid of Mounjaro, a weight loss medication.  The patient reports occasional muscle spasms, but denies experiencing nausea, vomiting, diarrhea, chest pain, or shortness of breath. She is able to ambulate with the assistance of a walker and engage in activities at her facility.  Her medical history is significant for chronic kidney disease, anemia, diabetes, gout, hypertension, hyperlipidemia, ovarian mass, spinal stenosis, and hyperparathyroidism. She also has a history of uterine bleeding, but she believes her menstrual cycles have ceased.  The patient has a significant smoking history, currently smoking three  cigarettes per day, but denies current alcohol or illicit drug use. She has a family history of diabetes, heart disease, and hypertension.  The patient's recent lab results indicate a low hemoglobin level of 9.8 and a serum creatinine level of 4.91, suggesting worsening kidney function. Other lab results, including serum protein electrophoresis and quantitative immunoglobulin tests, have been conducted to evaluate for potential multiple myeloma. However, the results are not definitively indicative of this condition.        Past Medical History:  Diagnosis Date   Abnormal uterine bleeding    Anemia associated with chronic renal failure    Anxiety and depression    Cellulitis    Chronic bronchitis (HCC)    CKD (chronic kidney disease), stage III (HCC)    Diabetes mellitus    Diabetes mellitus without complication (HCC) 05/09/2009   Qualifier: Diagnosis of  By: Thad Ranger LPN, Megan     Gout    History of noncompliance with medical treatment    Hyperlipidemia    Hyperparathyroidism, secondary renal (HCC)    Hypertension    Hypokalemia 12/08/2012   IgM lambda monoclonal gammopathy    Morbid obesity (HCC)    Neuropathy    Non-ketotic hyperosmolar coma (HCC)    Ovarian mass    Smoker    Spinal stenosis     Past Surgical History:  Procedure Laterality Date   TONSILLECTOMY      Family History  Problem Relation Age of Onset   Diabetes Mother    Hypertension Mother    Hyperlipidemia Mother    Cataracts Mother    Diabetes Father    Heart disease Father    Hypertension Sister    Diabetes Sister    Hypertension Sister  Diabetes Sister    Hypertension Sister    Diabetes Sister    Hypertension Sister    Diabetes Sister     Social History Social History   Tobacco Use   Smoking status: Every Day    Current packs/day: 0.10    Average packs/day: 0.1 packs/day for 23.0 years (2.3 ttl pk-yrs)    Types: Cigarettes   Smokeless tobacco: Never  Vaping Use   Vaping status:  Never Used  Substance Use Topics   Alcohol use: Not Currently    Comment: Occasional use: 4 drinks per occasion, once a month   Drug use: No    Allergies  Allergen Reactions   Penicillins Shortness Of Breath    Inflates bronchitis Tolerates Keflex, Rocephin    Ibuprofen Other (See Comments)    Can not take due to kidney problems.    Peach Flavor Swelling    Peaches.    Strawberry Extract Swelling    Current Outpatient Medications  Medication Sig Dispense Refill   acetaminophen (TYLENOL) 325 MG tablet Take 650 mg by mouth every 6 (six) hours as needed for moderate pain.     albuterol (PROVENTIL HFA;VENTOLIN HFA) 108 (90 BASE) MCG/ACT inhaler Inhale 2 puffs into the lungs every 6 (six) hours as needed for wheezing or shortness of breath. For shortness of breath     levothyroxine (SYNTHROID) 50 MCG tablet Take 50 mcg by mouth daily before breakfast.     allopurinol (ZYLOPRIM) 100 MG tablet Take 100 mg by mouth 2 (two) times daily.     atorvastatin (LIPITOR) 20 MG tablet Take 20 mg by mouth daily.     B-D UF III MINI PEN NEEDLES 31G X 5 MM MISC See admin instructions.  0   chlorzoxazone (PARAFON) 500 MG tablet Take 0.5 tablets (250 mg total) by mouth 3 (three) times daily as needed for muscle spasms. 30 tablet 0   gabapentin (NEURONTIN) 100 MG capsule Take 2 capsules (200 mg total) by mouth 2 (two) times daily. 60 capsule 1   glipiZIDE (GLUCOTROL) 5 MG tablet Take 0.5 tablets (2.5 mg total) by mouth daily before breakfast.     glucose blood (ONETOUCH VERIO) test strip Use as instructed to check blood sugar 2 times daily Dx code E11.9 100 each 3   insulin glargine (LANTUS) 100 UNIT/ML injection Inject into the skin daily.     insulin glargine-yfgn (SEMGLEE) 100 UNIT/ML injection Inject 0.08 mLs (8 Units total) into the skin daily. 10 mL 11   insulin lispro (HUMALOG) 100 UNIT/ML KwikPen      Insulin Syringe-Needle U-100 (SAFETY-GLIDE 0.3CC SYR 29GX1/2) 29G X 1/2" 0.3 ML MISC Use as  directed. 200 each 0   meclizine (ANTIVERT) 25 MG tablet Take 1 tablet (25 mg total) by mouth 3 (three) times daily as needed for dizziness. (Patient not taking: Reported on 01/28/2023) 30 tablet 0   Multiple Vitamin (MULTIVITAMIN WITH MINERALS) TABS tablet Take 1 tablet by mouth daily. (Patient not taking: Reported on 05/18/2021) 30 tablet 0   omeprazole (PRILOSEC) 20 MG capsule Take 20 mg by mouth daily before breakfast. (Patient not taking: Reported on 01/28/2023)     psyllium (METAMUCIL) 58.6 % packet Take 1 packet by mouth daily as needed (diarrhea).     rosuvastatin (CRESTOR) 10 MG tablet Take 10 mg by mouth at bedtime.     SYMBICORT 160-4.5 MCG/ACT inhaler Inhale 2 puffs into the lungs 2 (two) times daily as needed (sob/wheezing).  3   No current facility-administered medications  for this visit.    Review of Systems  Constitutional: positive for fatigue and weight loss Eyes: negative Ears, nose, mouth, throat, and face: negative Respiratory: negative Cardiovascular: negative Gastrointestinal: negative Genitourinary:negative Integument/breast: negative Hematologic/lymphatic: negative Musculoskeletal:positive for arthralgias and muscle weakness Neurological: negative Behavioral/Psych: negative Endocrine: negative Allergic/Immunologic: negative  Physical Exam  ZOX:WRUEA, healthy, no distress, well nourished, well developed, and obese SKIN: skin color, texture, turgor are normal, no rashes or significant lesions HEAD: Normocephalic, No masses, lesions, tenderness or abnormalities EYES: normal, PERRLA, Conjunctiva are pink and non-injected EARS: External ears normal, Canals clear OROPHARYNX:no exudate, no erythema, and lips, buccal mucosa, and tongue normal  NECK: supple, no adenopathy, no JVD LYMPH:  no palpable lymphadenopathy, no hepatosplenomegaly BREAST:not examined LUNGS: clear to auscultation , and palpation HEART: regular rate & rhythm, no murmurs, and no  gallops ABDOMEN:abdomen soft, non-tender, obese, normal bowel sounds, and no masses or organomegaly BACK: Back symmetric, no curvature., No CVA tenderness EXTREMITIES:no joint deformities, effusion, or inflammation, no edema  NEURO: alert & oriented x 3 with fluent speech, no focal motor/sensory deficits  PERFORMANCE STATUS: ECOG 2  LABORATORY DATA: Lab Results  Component Value Date   WBC 6.2 01/30/2023   HGB 9.8 (L) 01/30/2023   HCT 31.2 (L) 01/30/2023   MCV 88.6 01/30/2023   PLT 193 01/30/2023      Chemistry      Component Value Date/Time   NA 132 (L) 06/07/2021 0354   K 5.0 06/07/2021 0354   CL 105 06/07/2021 0354   CO2 20 (L) 06/07/2021 0354   BUN 11 06/07/2021 0354   CREATININE 0.97 06/07/2021 0354   CREATININE 1.54 (H) 05/01/2021 1157      Component Value Date/Time   CALCIUM 8.1 (L) 06/07/2021 0354   CALCIUM 9.2 12/28/2008 2233   ALKPHOS 85 06/02/2021 0403   AST 36 06/02/2021 0403   ALT 23 06/02/2021 0403   BILITOT 0.4 06/02/2021 0403       RADIOGRAPHIC STUDIES: No results found.  ASSESSMENT AND PLAN: This is a very pleasant 57 years old African-American female morbidly obese with multiple comorbidities including worsening renal insufficiency referred to me for evaluation of IgG monoclonal gammopathy.    Chronic Kidney Disease Worsening serum creatinine (4.91). Possible proteinuria contributing to kidney disease. Monoclonal protein (M spike) detected but not at levels typically associated with multiple myeloma. -Collect 24-hour urine protein to assess for proteinuria. -Follow-up in 1 month to review lab results and assess need for bone marrow biopsy.  Muscle Spasms Intermittent muscle spasms, location unspecified. -Continue current management, no new interventions discussed.  Obesity Significant weight loss (100 lbs) with Mounjaro. Planning for bariatric surgery pending cardiac and renal clearance. -Continue current weight loss management with  Mounjaro.  Smoking Current smoker, 3 cigarettes per day. -Advised to quit smoking, especially in preparation for potential surgery.  Gout History of gout causing mobility issues and necessitating use of a walker. -Continue current management, no new interventions discussed.  Ovarian Mass History of ovarian mass, no current follow-up. -Consider referral to gynecologist for further evaluation.  Anemia Current hemoglobin 9.8, no current supplementation. -Continue monitoring, no new interventions discussed.   The patient was advised to call immediately if she has any other concerning symptoms in the interval. The patient voices understanding of current disease status and treatment options and is in agreement with the current care plan.  All questions were answered. The patient knows to call the clinic with any problems, questions or concerns. We can certainly see the  patient much sooner if necessary.  Thank you so much for allowing me to participate in the care of Makayla [name]. I will continue to follow up the patient with you and assist in her care.  The total time spent in the appointment was 60 minutes.  Disclaimer: This note was dictated with voice recognition software. Similar sounding words can inadvertently be transcribed and may not be corrected upon review.   Lajuana Matte January 30, 2023, 12:06 PM

## 2023-01-31 LAB — FERRITIN: Ferritin: 84 ng/mL (ref 11–307)

## 2023-01-31 LAB — BETA 2 MICROGLOBULIN, SERUM: Beta-2 Microglobulin: 13.7 mg/L — ABNORMAL HIGH (ref 0.6–2.4)

## 2023-02-01 ENCOUNTER — Encounter (HOSPITAL_COMMUNITY): Payer: Medicare Other

## 2023-02-01 LAB — IGG, IGA, IGM
IgA: 614 mg/dL — ABNORMAL HIGH (ref 87–352)
IgG (Immunoglobin G), Serum: 2218 mg/dL — ABNORMAL HIGH (ref 586–1602)
IgM (Immunoglobulin M), Srm: 129 mg/dL (ref 26–217)

## 2023-02-01 LAB — KAPPA/LAMBDA LIGHT CHAINS
Kappa free light chain: 307.6 mg/L — ABNORMAL HIGH (ref 3.3–19.4)
Kappa, lambda light chain ratio: 2.89 — ABNORMAL HIGH (ref 0.26–1.65)
Lambda free light chains: 106.3 mg/L — ABNORMAL HIGH (ref 5.7–26.3)

## 2023-02-04 LAB — MULTIPLE MYELOMA PANEL, SERUM
Albumin SerPl Elph-Mcnc: 3 g/dL (ref 2.9–4.4)
Albumin/Glob SerPl: 0.8 (ref 0.7–1.7)
Alpha 1: 0.2 g/dL (ref 0.0–0.4)
Alpha2 Glob SerPl Elph-Mcnc: 0.8 g/dL (ref 0.4–1.0)
B-Globulin SerPl Elph-Mcnc: 1 g/dL (ref 0.7–1.3)
Gamma Glob SerPl Elph-Mcnc: 2 g/dL — ABNORMAL HIGH (ref 0.4–1.8)
Globulin, Total: 4.1 g/dL — ABNORMAL HIGH (ref 2.2–3.9)
IgA: 585 mg/dL — ABNORMAL HIGH (ref 87–352)
IgG (Immunoglobin G), Serum: 2228 mg/dL — ABNORMAL HIGH (ref 586–1602)
IgM (Immunoglobulin M), Srm: 126 mg/dL (ref 26–217)
M Protein SerPl Elph-Mcnc: 0.1 g/dL — ABNORMAL HIGH
Total Protein ELP: 7.1 g/dL (ref 6.0–8.5)

## 2023-02-05 ENCOUNTER — Encounter: Payer: Self-pay | Admitting: Family Medicine

## 2023-02-13 ENCOUNTER — Ambulatory Visit (HOSPITAL_COMMUNITY): Payer: Medicare Other | Attending: Cardiovascular Disease

## 2023-02-13 DIAGNOSIS — I5043 Acute on chronic combined systolic (congestive) and diastolic (congestive) heart failure: Secondary | ICD-10-CM | POA: Insufficient documentation

## 2023-02-13 DIAGNOSIS — M7989 Other specified soft tissue disorders: Secondary | ICD-10-CM | POA: Insufficient documentation

## 2023-02-13 LAB — ECHOCARDIOGRAM COMPLETE
Area-P 1/2: 4.15 cm2
S' Lateral: 3.1 cm

## 2023-02-28 ENCOUNTER — Inpatient Hospital Stay: Payer: Medicare Other | Admitting: Internal Medicine

## 2023-02-28 ENCOUNTER — Inpatient Hospital Stay: Payer: Medicare Other | Attending: Internal Medicine

## 2023-02-28 ENCOUNTER — Telehealth: Payer: Self-pay | Admitting: Medical Oncology

## 2023-02-28 NOTE — Telephone Encounter (Signed)
LVM to return my call about her missed appt this am .

## 2024-02-14 ENCOUNTER — Telehealth (HOSPITAL_COMMUNITY): Payer: Self-pay | Admitting: Pharmacy Technician

## 2024-02-14 ENCOUNTER — Other Ambulatory Visit (HOSPITAL_COMMUNITY): Payer: Self-pay | Admitting: Nephrology

## 2024-02-14 DIAGNOSIS — D631 Anemia in chronic kidney disease: Secondary | ICD-10-CM | POA: Insufficient documentation

## 2024-02-14 NOTE — Telephone Encounter (Signed)
 Auth Submission: NO AUTH NEEDED Site of care: [facility] Payer: [employer] MEDICARE Medication & CPT/J Code(s) submitted: Feraheme (ferumoxytol) [id number] Diagnosis Code: N18.9, D63.1 Route of submission (phone, fax, portal):  Phone # Fax # Auth type: Buy/Bill HB Units/visits requested: 510mg  x 2 doses Reference number: [id number] Approval from: 02/14/2024 to 04/08/24     Dagoberto Armour, CPhT [facility] Phone: [phone] 02/14/2024

## 2024-02-24 ENCOUNTER — Other Ambulatory Visit (HOSPITAL_COMMUNITY): Payer: Self-pay | Admitting: Nephrology

## 2024-02-24 ENCOUNTER — Telehealth (HOSPITAL_COMMUNITY): Payer: Self-pay | Admitting: Pharmacy Technician

## 2024-02-24 DIAGNOSIS — D631 Anemia in chronic kidney disease: Secondary | ICD-10-CM | POA: Insufficient documentation

## 2024-02-24 NOTE — Telephone Encounter (Signed)
 Auth Submission: NO AUTH NEEDED Site of care: [facility] Payer: [employer] MEDICARE Medication & CPT/J Code(s) submitted: G9118 ARANESP Diagnosis Code: N18.5, D63.1 Route of submission (phone, fax, portal):  Phone # Fax # Auth type: Buy/Bill HB Units/visits requested: Q 4 WEEKS Reference number: [id number] Approval from: 02/24/2024 to 05/09/24     Dagoberto Armour, CPhT [facility] Phone: [phone] 02/24/2024

## 2024-02-26 ENCOUNTER — Inpatient Hospital Stay (HOSPITAL_COMMUNITY)
Admission: RE | Admit: 2024-02-26 | Discharge: 2024-02-26 | Disposition: A | Source: Ambulatory Visit | Attending: Nephrology

## 2024-02-26 ENCOUNTER — Ambulatory Visit (HOSPITAL_COMMUNITY)
Admission: RE | Admit: 2024-02-26 | Discharge: 2024-02-26 | Disposition: A | Discharge status: Home / Self Care | Source: Ambulatory Visit | Attending: Nephrology | Admitting: Nephrology

## 2024-02-26 VITALS — BP 159/66 | HR 79 | Temp 98.3°F | Resp 18

## 2024-02-26 VITALS — BP 167/73 | HR 80 | Temp 98.6°F | Resp 20

## 2024-02-26 DIAGNOSIS — N185 Chronic kidney disease, stage 5: Secondary | ICD-10-CM | POA: Insufficient documentation

## 2024-02-26 DIAGNOSIS — D631 Anemia in chronic kidney disease: Secondary | ICD-10-CM | POA: Diagnosis present

## 2024-02-26 DIAGNOSIS — N189 Chronic kidney disease, unspecified: Secondary | ICD-10-CM | POA: Insufficient documentation

## 2024-02-26 LAB — POCT HEMOGLOBIN-HEMACUE: Hemoglobin: 8.4 g/dL — ABNORMAL LOW (ref 12.0–15.0)

## 2024-02-26 MED ORDER — SODIUM CHLORIDE 0.9 % IV SOLN
510.0000 mg | Freq: Once | INTRAVENOUS | Status: AC
  Administered 2024-02-26: 510 mg via INTRAVENOUS
  Filled 2024-02-26: qty 510

## 2024-02-26 MED ORDER — DARBEPOETIN ALFA 100 MCG/0.5ML IJ SOSY
PREFILLED_SYRINGE | INTRAMUSCULAR | Status: AC
  Filled 2024-02-26: qty 0.5

## 2024-02-26 MED ORDER — DARBEPOETIN ALFA 100 MCG/0.5ML IJ SOSY
100.0000 ug | PREFILLED_SYRINGE | Freq: Once | INTRAMUSCULAR | Status: AC
  Administered 2024-02-26: 100 ug via SUBCUTANEOUS

## 2024-03-04 ENCOUNTER — Ambulatory Visit (HOSPITAL_COMMUNITY)
Admission: RE | Admit: 2024-03-04 | Discharge: 2024-03-04 | Disposition: A | Discharge status: Home / Self Care | Source: Ambulatory Visit | Attending: Nephrology | Admitting: Nephrology

## 2024-03-04 VITALS — BP 164/85 | HR 76 | Temp 99.0°F | Resp 17

## 2024-03-04 DIAGNOSIS — N189 Chronic kidney disease, unspecified: Secondary | ICD-10-CM | POA: Diagnosis present

## 2024-03-04 DIAGNOSIS — D631 Anemia in chronic kidney disease: Secondary | ICD-10-CM | POA: Insufficient documentation

## 2024-03-04 MED ORDER — SODIUM CHLORIDE 0.9 % IV SOLN
510.0000 mg | Freq: Once | INTRAVENOUS | Status: AC
  Administered 2024-03-04: 510 mg via INTRAVENOUS
  Filled 2024-03-04: qty 510

## 2024-03-23 ENCOUNTER — Telehealth: Payer: Self-pay | Admitting: Internal Medicine

## 2024-03-23 NOTE — Telephone Encounter (Signed)
 Scheduled appointments with [facility]. Spoke to Tamiko Lyndsey at [phone] and scheduled appointments as well as transportation.

## 2024-03-25 ENCOUNTER — Encounter (HOSPITAL_COMMUNITY)

## 2024-04-20 ENCOUNTER — Ambulatory Visit (HOSPITAL_COMMUNITY)
Admission: RE | Admit: 2024-04-20 | Discharge: 2024-04-20 | Disposition: A | Discharge status: Home / Self Care | Source: Ambulatory Visit | Attending: Nephrology | Admitting: Nephrology

## 2024-04-20 ENCOUNTER — Telehealth (HOSPITAL_COMMUNITY): Payer: Self-pay | Admitting: Pharmacy Technician

## 2024-04-20 ENCOUNTER — Other Ambulatory Visit (HOSPITAL_COMMUNITY): Payer: Self-pay | Admitting: Nephrology

## 2024-04-20 ENCOUNTER — Other Ambulatory Visit: Payer: Self-pay | Admitting: Medical Oncology

## 2024-04-20 VITALS — BP 151/75 | HR 81 | Temp 97.7°F | Resp 17

## 2024-04-20 DIAGNOSIS — D631 Anemia in chronic kidney disease: Secondary | ICD-10-CM | POA: Diagnosis present

## 2024-04-20 DIAGNOSIS — N185 Chronic kidney disease, stage 5: Secondary | ICD-10-CM | POA: Insufficient documentation

## 2024-04-20 LAB — RENAL FUNCTION PANEL
Albumin: 3.3 g/dL — ABNORMAL LOW (ref 3.5–5.0)
Anion gap: 11 (ref 5–15)
BUN: 55 mg/dL — ABNORMAL HIGH (ref 6–20)
CO2: 21 mmol/L — ABNORMAL LOW (ref 22–32)
Calcium: 8.6 mg/dL — ABNORMAL LOW (ref 8.9–10.3)
Chloride: 108 mmol/L (ref 98–111)
Creatinine, Ser: 7.9 mg/dL — ABNORMAL HIGH (ref 0.44–1.00)
GFR, Estimated: 6 mL/min — ABNORMAL LOW
Glucose, Bld: 77 mg/dL (ref 70–99)
Phosphorus: 5.3 mg/dL — ABNORMAL HIGH (ref 2.5–4.6)
Potassium: 5 mmol/L (ref 3.5–5.1)
Sodium: 140 mmol/L (ref 135–145)

## 2024-04-20 LAB — IRON AND TIBC
Iron: 74 ug/dL (ref 28–170)
Saturation Ratios: 40 % — ABNORMAL HIGH (ref 10.4–31.8)
TIBC: 186 ug/dL — ABNORMAL LOW (ref 250–450)
UIBC: 113 ug/dL

## 2024-04-20 LAB — FERRITIN: Ferritin: 607 ng/mL — ABNORMAL HIGH (ref 11–307)

## 2024-04-20 LAB — POCT HEMOGLOBIN-HEMACUE: Hemoglobin: 8.6 g/dL — ABNORMAL LOW (ref 12.0–15.0)

## 2024-04-20 MED ORDER — DARBEPOETIN ALFA 100 MCG/0.5ML IJ SOSY
100.0000 ug | PREFILLED_SYRINGE | Freq: Once | INTRAMUSCULAR | Status: AC
  Administered 2024-04-20: 100 ug via SUBCUTANEOUS

## 2024-04-20 MED ORDER — DARBEPOETIN ALFA 100 MCG/0.5ML IJ SOSY
PREFILLED_SYRINGE | INTRAMUSCULAR | Status: AC
  Filled 2024-04-20: qty 0.5

## 2024-04-20 NOTE — Telephone Encounter (Signed)
 Auth Submission: NO AUTH NEEDED Site of care: [facility] Payer: [employer] MEDICARE Medication & CPT/J Code(s) submitted: [id number] ARANESP   Diagnosis Code: N18.5, D63.1 Route of submission (phone, fax, portal):  Phone # Fax # Auth type: Buy/Bill HB Units/visits requested: 100mcg every 4 weeks Reference number: [id number] Approval from: 04/20/2024 to 09/07/24    Dagoberto Armour, CPhT [facility] Phone: [phone] 04/20/2024

## 2024-04-20 NOTE — Progress Notes (Signed)
.  err

## 2024-04-21 ENCOUNTER — Inpatient Hospital Stay: Attending: Internal Medicine

## 2024-04-21 ENCOUNTER — Inpatient Hospital Stay: Admitting: Internal Medicine

## 2024-04-21 ENCOUNTER — Telehealth: Payer: Self-pay | Admitting: Medical Oncology

## 2024-04-21 ENCOUNTER — Other Ambulatory Visit: Payer: Self-pay | Admitting: Medical Oncology

## 2024-04-21 VITALS — BP 161/81 | HR 77 | Resp 17 | Ht 64.0 in | Wt 365.0 lb

## 2024-04-21 DIAGNOSIS — D472 Monoclonal gammopathy: Secondary | ICD-10-CM

## 2024-04-21 DIAGNOSIS — D631 Anemia in chronic kidney disease: Secondary | ICD-10-CM

## 2024-04-21 DIAGNOSIS — N184 Chronic kidney disease, stage 4 (severe): Secondary | ICD-10-CM | POA: Diagnosis not present

## 2024-04-21 LAB — CBC WITH DIFFERENTIAL (CANCER CENTER ONLY)
Abs Immature Granulocytes: 0 K/uL (ref 0.00–0.07)
Basophils Absolute: 0 K/uL (ref 0.0–0.1)
Basophils Relative: 1 %
Eosinophils Absolute: 0.3 K/uL (ref 0.0–0.5)
Eosinophils Relative: 8 %
HCT: 27.7 % — ABNORMAL LOW (ref 36.0–46.0)
Hemoglobin: 8.4 g/dL — ABNORMAL LOW (ref 12.0–15.0)
Immature Granulocytes: 0 %
Lymphocytes Relative: 39 %
Lymphs Abs: 1.6 K/uL (ref 0.7–4.0)
MCH: 27.1 pg (ref 26.0–34.0)
MCHC: 30.3 g/dL (ref 30.0–36.0)
MCV: 89.4 fL (ref 80.0–100.0)
Monocytes Absolute: 0.6 K/uL (ref 0.1–1.0)
Monocytes Relative: 14 %
Neutro Abs: 1.6 K/uL — ABNORMAL LOW (ref 1.7–7.7)
Neutrophils Relative %: 38 %
Platelet Count: 128 K/uL — ABNORMAL LOW (ref 150–400)
RBC: 3.1 MIL/uL — ABNORMAL LOW (ref 3.87–5.11)
RDW: 15.5 % (ref 11.5–15.5)
WBC Count: 4.2 K/uL (ref 4.0–10.5)
nRBC: 0 % (ref 0.0–0.2)

## 2024-04-21 LAB — CMP (CANCER CENTER ONLY)
ALT: 9 U/L (ref 0–44)
AST: 20 U/L (ref 15–41)
Albumin: 3.4 g/dL — ABNORMAL LOW (ref 3.5–5.0)
Alkaline Phosphatase: 167 U/L — ABNORMAL HIGH (ref 38–126)
Anion gap: 14 (ref 5–15)
BUN: 55 mg/dL — ABNORMAL HIGH (ref 6–20)
CO2: 19 mmol/L — ABNORMAL LOW (ref 22–32)
Calcium: 8.7 mg/dL — ABNORMAL LOW (ref 8.9–10.3)
Chloride: 108 mmol/L (ref 98–111)
Creatinine: 8.29 mg/dL (ref 0.44–1.00)
GFR, Estimated: 5 mL/min — ABNORMAL LOW
Glucose, Bld: 61 mg/dL — ABNORMAL LOW (ref 70–99)
Potassium: 4.9 mmol/L (ref 3.5–5.1)
Sodium: 142 mmol/L (ref 135–145)
Total Bilirubin: 0.3 mg/dL (ref 0.0–1.2)
Total Protein: 7.5 g/dL (ref 6.5–8.1)

## 2024-04-21 LAB — LACTATE DEHYDROGENASE: LDH: 255 U/L — ABNORMAL HIGH (ref 105–235)

## 2024-04-21 NOTE — Telephone Encounter (Signed)
 CRITICAL VALUE STICKER  CRITICAL VALUE: creatinine=8.29  RECEIVER (on-site recipient of call):Gaetano Romberger  DATE & TIME NOTIFIED: 04/21/24 @ 1137  MESSENGER (representative from lab):Aldona  MD NOTIFIED: Sherrod, MD  TIME OF NOTIFICATION:1138  RESPONSE:  pt seen today by Dr. Sherrod the patient has chronic kidney disease. Creatinine result called to PCP Luke Miyamoto, NP.

## 2024-04-21 NOTE — Progress Notes (Signed)
 "     [facility] [phone]   [phone]  OFFICE PROGRESS NOTE  Leontine Cramp, NP [address] [place] [address]  DIAGNOSIS:  1) monoclonal gammopathy of undetermined significance. 2) anemia of chronic kidney disease 3) morbid obesity  PRIOR THERAPY: None  CURRENT THERAPY: Observation  INTERVAL HISTORY: Makayla [name] 59 y.o. female returns to the clinic today for follow-up visit.  She was lost to follow-up for more than a year. Discussed the use of AI scribe software for clinical note transcription with the patient, who gave verbal consent to proceed.  History of Present Illness Makayla [name] is a 59 year old female with monoclonal gammopathy of undetermined significance (MGUS) and anemia secondary to chronic kidney disease who presents for hematology/oncology follow-up to reassess disease status and anemia management after a 21-month lapse in care.  She was last evaluated in October 2024 for MGUS and anemia, with a recent primary care visit confirming persistent anemia. Laboratory studies demonstrated normal iron and ferritin levels, and she has not required iron supplementation or transfusion. She is awaiting updated myeloma panel results.  She reports feeling generally well, with intermittent muscle spasms as her primary symptom. She does not receive significant pharmacologic treatment for these spasms due to concerns regarding renal function. She denies persistent fatigue, dizziness, chest pain, or respiratory symptoms, but notes that anemia occasionally causes her to fall asleep.  She resides in a care facility, participates in activities such as cards and bingo, and uses a walker for mobility. She arrived at the appointment via stretcher and expresses frustration with her current limitations.     MEDICAL HISTORY: Past Medical History:  Diagnosis Date   Abnormal uterine bleeding    Anemia associated with chronic renal failure    Anxiety and  depression    Cellulitis    Chronic bronchitis (HCC)    CKD (chronic kidney disease), stage III (HCC)    Diabetes mellitus    Diabetes mellitus without complication (HCC) 05/09/2009   Qualifier: Diagnosis of  By: Germaine LPN, Megan     Gout    History of noncompliance with medical treatment    Hyperlipidemia    Hyperparathyroidism, secondary renal    Hypertension    Hypokalemia 12/08/2012   IgM lambda monoclonal gammopathy    Morbid obesity (HCC)    Neuropathy    Non-ketotic hyperosmolar coma (HCC)    Ovarian mass    Smoker    Spinal stenosis     ALLERGIES:  is allergic to penicillins, ibuprofen, peach flavoring agent (non-screening), and strawberry extract.  MEDICATIONS:  Current Outpatient Medications  Medication Sig Dispense Refill   acetaminophen  (TYLENOL ) 325 MG tablet Take 650 mg by mouth every 6 (six) hours as needed for moderate pain.     albuterol  (PROVENTIL  HFA;VENTOLIN  HFA) 108 (90 BASE) MCG/ACT inhaler Inhale 2 puffs into the lungs every 6 (six) hours as needed for wheezing or shortness of breath. For shortness of breath     allopurinol  (ZYLOPRIM ) 100 MG tablet Take 100 mg by mouth 2 (two) times daily.     atorvastatin  (LIPITOR ) 20 MG tablet Take 80 mg by mouth daily.     B-D UF III MINI PEN NEEDLES 31G X 5 MM MISC See admin instructions.  0   chlorzoxazone  (PARAFON ) 500 MG tablet Take 0.5 tablets (250 mg total) by mouth 3 (three) times daily as needed for muscle spasms. 30 tablet 0   fluticasone-salmeterol (ADVAIR) 250-50 MCG/ACT AEPB Inhale 1 puff into the lungs  in the morning and at bedtime.     gabapentin  (NEURONTIN ) 100 MG capsule Take 2 capsules (200 mg total) by mouth 2 (two) times daily. 60 capsule 1   glipiZIDE  (GLUCOTROL ) 5 MG tablet Take 0.5 tablets (2.5 mg total) by mouth daily before breakfast.     Glucagon, rDNA, (GLUCAGON EMERGENCY IJ) Inject as directed.     glucose blood (ONETOUCH VERIO) test strip Use as instructed to check blood sugar 2 times daily  Dx code E11.9 100 each 3   insulin  glargine (LANTUS ) 100 UNIT/ML injection Inject into the skin daily.     insulin  glargine-yfgn (SEMGLEE ) 100 UNIT/ML injection Inject 0.08 mLs (8 Units total) into the skin daily. 10 mL 11   insulin  lispro (HUMALOG) 100 UNIT/ML KwikPen      Insulin  Syringe-Needle U-100 (SAFETY-GLIDE 0.3CC SYR 29GX1/2) 29G X 1/2 0.3 ML MISC Use as directed. 200 each 0   levothyroxine  (SYNTHROID ) 50 MCG tablet Take 50 mcg by mouth daily before breakfast.     Multiple Vitamin (MULTIVITAMIN WITH MINERALS) TABS tablet Take 1 tablet by mouth daily. (Patient not taking: Reported on 05/18/2021) 30 tablet 0   omeprazole (PRILOSEC) 20 MG capsule Take 20 mg by mouth daily before breakfast.     OVER THE COUNTER MEDICATION Apply 1 Application topically daily as needed.     psyllium (METAMUCIL) 58.6 % packet Take 1 packet by mouth daily as needed (diarrhea).     SYMBICORT 160-4.5 MCG/ACT inhaler Inhale 2 puffs into the lungs 2 (two) times daily as needed (sob/wheezing).  3   No current facility-administered medications for this visit.    SURGICAL HISTORY:  Past Surgical History:  Procedure Laterality Date   TONSILLECTOMY      REVIEW OF SYSTEMS:  A comprehensive review of systems was negative except for: Constitutional: positive for fatigue Respiratory: positive for dyspnea on exertion Musculoskeletal: positive for arthralgias and muscle weakness   PHYSICAL EXAMINATION: General appearance: alert, cooperative, fatigued, and no distress Head: Normocephalic, without obvious abnormality, atraumatic Neck: no adenopathy, no JVD, supple, symmetrical, trachea midline, and thyroid  not enlarged, symmetric, no tenderness/mass/nodules Lymph nodes: Cervical, supraclavicular, and axillary nodes normal. Resp: clear to auscultation bilaterally Back: symmetric, no curvature. ROM normal. No CVA tenderness. Cardio: regular rate and rhythm, S1, S2 normal, no murmur, click, rub or gallop GI: soft,  non-tender; bowel sounds normal; no masses,  no organomegaly Extremities: extremities normal, atraumatic, no cyanosis or edema  ECOG PERFORMANCE STATUS: 2 - Symptomatic, <50% confined to bed  Pulse 77, resp. rate 17, height 5' 4 (1.626 m), weight (!) 365 lb (165.6 kg), last menstrual period 12/08/2012, SpO2 99%.  LABORATORY DATA: Lab Results  Component Value Date   WBC 4.2 04/21/2024   HGB 8.4 (L) 04/21/2024   HCT 27.7 (L) 04/21/2024   MCV 89.4 04/21/2024   PLT 128 (L) 04/21/2024      Chemistry      Component Value Date/Time   NA 140 04/20/2024 0955   K 5.0 04/20/2024 0955   CL 108 04/20/2024 0955   CO2 21 (L) 04/20/2024 0955   BUN 55 (H) 04/20/2024 0955   CREATININE 7.90 (H) 04/20/2024 0955   CREATININE 4.91 (H) 01/30/2023 1146   CREATININE 1.54 (H) 05/01/2021 1157      Component Value Date/Time   CALCIUM  8.6 (L) 04/20/2024 0955   CALCIUM  9.2 12/28/2008 2233   ALKPHOS 107 01/30/2023 1146   AST 21 01/30/2023 1146   ALT 11 01/30/2023 1146   BILITOT 0.3 01/30/2023 1146  RADIOGRAPHIC STUDIES: No results found.  ASSESSMENT AND PLAN: This is a very pleasant 59 years old African-American female morbidly obese with multiple comorbidities including worsening renal insufficiency with anemia of chronic disease and monoclonal gammopathy of undetermined significance. Assessment and Plan Assessment & Plan Monoclonal gammopathy of undetermined significance Monoclonal gammopathy of undetermined significance with prior evaluation for progression to multiple myeloma. Ongoing surveillance is required to monitor for progression to multiple myeloma. - Ordered repeat blood work and myeloma panel to assess for disease progression. - Will review laboratory results to determine need for further procedures, including bone marrow biopsy. - Scheduled follow-up in 1 month for reassessment.  Anemia of chronic kidney disease Anemia secondary to chronic kidney disease, with hemoglobin 8.4  and hematocrit 27.7. Iron studies are within normal limits, excluding iron deficiency. - Reviewed iron studies; iron infusion not indicated. - Ordered repeat complete blood count to monitor anemia. - Scheduled follow-up in 1 month for reassessment.  Chronic kidney disease Chronic kidney disease contributing to anemia and restricting pharmacologic options for muscle spasms. - Discussed medication limitations for muscle spasms due to renal impairment. - Scheduled follow-up in 1 month to reassess overall status. She was advised to call immediately if she has any other concerning symptoms in the interval. The patient voices understanding of current disease status and treatment options and is in agreement with the current care plan.  All questions were answered. The patient knows to call the clinic with any problems, questions or concerns. We can certainly see the patient much sooner if necessary.  The total time spent in the appointment was 20 minutes including review of chart and various tests results, discussions about plan of care and coordination of care plan .   Disclaimer: This note was dictated with voice recognition software. Similar sounding words can inadvertently be transcribed and may not be corrected upon review.        "

## 2024-04-21 NOTE — Telephone Encounter (Signed)
 Faxed creatinine result to Dr Rayburn.

## 2024-04-22 ENCOUNTER — Encounter (HOSPITAL_COMMUNITY)

## 2024-04-22 LAB — KAPPA/LAMBDA LIGHT CHAINS
Kappa free light chain: 248.8 mg/L — ABNORMAL HIGH (ref 3.3–19.4)
Kappa, lambda light chain ratio: 2.78 — ABNORMAL HIGH (ref 0.26–1.65)
Lambda free light chains: 89.5 mg/L — ABNORMAL HIGH (ref 5.7–26.3)

## 2024-04-22 LAB — BETA 2 MICROGLOBULIN, SERUM: Beta-2 Microglobulin: 13.3 mg/L — ABNORMAL HIGH (ref 0.6–2.4)

## 2024-04-23 ENCOUNTER — Other Ambulatory Visit (HOSPITAL_COMMUNITY): Payer: Self-pay | Admitting: Nephrology

## 2024-04-23 NOTE — Progress Notes (Signed)
 Received updated order for Aranesp   Dose: 150mcg every 4 weeks  Labs: POCT Hgb every visit; iron panel and ferritin every 2 months; renal panel monthly  Last dose: 04/20/2024 Next dose: 2/9/206 (appropriate)  Sherry Pennant, PharmD, MPH, BCPS, CPP Clinical Pharmacist

## 2024-04-23 NOTE — Addendum Note (Signed)
 Addended by: DAYNE SHERRY RAMAN on: 04/23/2024 10:51 AM   Modules accepted: Orders

## 2024-04-24 LAB — MULTIPLE MYELOMA PANEL, SERUM
Albumin SerPl Elph-Mcnc: 3 g/dL (ref 2.9–4.4)
Albumin/Glob SerPl: 0.9 (ref 0.7–1.7)
Alpha 1: 0.2 g/dL (ref 0.0–0.4)
Alpha2 Glob SerPl Elph-Mcnc: 0.8 g/dL (ref 0.4–1.0)
B-Globulin SerPl Elph-Mcnc: 1 g/dL (ref 0.7–1.3)
Gamma Glob SerPl Elph-Mcnc: 1.7 g/dL (ref 0.4–1.8)
Globulin, Total: 3.7 g/dL (ref 2.2–3.9)
IgA: 525 mg/dL — ABNORMAL HIGH (ref 87–352)
IgG (Immunoglobin G), Serum: 1902 mg/dL — ABNORMAL HIGH (ref 586–1602)
IgM (Immunoglobulin M), Srm: 119 mg/dL (ref 26–217)
Total Protein ELP: 6.7 g/dL (ref 6.0–8.5)

## 2024-05-01 ENCOUNTER — Other Ambulatory Visit: Payer: Self-pay

## 2024-05-01 DIAGNOSIS — N1832 Chronic kidney disease, stage 3b: Secondary | ICD-10-CM

## 2024-05-01 DIAGNOSIS — M25529 Pain in unspecified elbow: Secondary | ICD-10-CM

## 2024-05-18 ENCOUNTER — Encounter (HOSPITAL_COMMUNITY)

## 2024-05-22 ENCOUNTER — Ambulatory Visit (HOSPITAL_COMMUNITY)

## 2024-05-22 ENCOUNTER — Ambulatory Visit (HOSPITAL_COMMUNITY): Admitting: Vascular Surgery

## 2024-05-25 ENCOUNTER — Inpatient Hospital Stay

## 2024-05-25 ENCOUNTER — Inpatient Hospital Stay: Admitting: Physician Assistant

## 2024-06-01 ENCOUNTER — Encounter (HOSPITAL_COMMUNITY)
